# Patient Record
Sex: Female | Born: 1955 | Race: Black or African American | Hispanic: No | Marital: Single | Smoking: Former smoker
Health system: Southern US, Community
[De-identification: ages and names within clinical notes are randomized; demographics above are authoritative.]

## PROBLEM LIST (undated history)

## (undated) DIAGNOSIS — F419 Anxiety disorder, unspecified: Secondary | ICD-10-CM

## (undated) DIAGNOSIS — E119 Type 2 diabetes mellitus without complications: Secondary | ICD-10-CM

## (undated) DIAGNOSIS — G4730 Sleep apnea, unspecified: Secondary | ICD-10-CM

## (undated) DIAGNOSIS — N189 Chronic kidney disease, unspecified: Secondary | ICD-10-CM

## (undated) DIAGNOSIS — K219 Gastro-esophageal reflux disease without esophagitis: Secondary | ICD-10-CM

## (undated) DIAGNOSIS — I10 Essential (primary) hypertension: Secondary | ICD-10-CM

## (undated) DIAGNOSIS — M059 Rheumatoid arthritis with rheumatoid factor, unspecified: Secondary | ICD-10-CM

## (undated) DIAGNOSIS — T7840XA Allergy, unspecified, initial encounter: Secondary | ICD-10-CM

## (undated) DIAGNOSIS — M109 Gout, unspecified: Secondary | ICD-10-CM

## (undated) DIAGNOSIS — M5030 Other cervical disc degeneration, unspecified cervical region: Secondary | ICD-10-CM

## (undated) DIAGNOSIS — R0902 Hypoxemia: Secondary | ICD-10-CM

## (undated) HISTORY — DX: Other cervical disc degeneration, unspecified cervical region: M50.30

## (undated) HISTORY — DX: Gout, unspecified: M10.9

## (undated) HISTORY — PX: BREAST BIOPSY: SHX20

## (undated) HISTORY — DX: Rheumatoid arthritis with rheumatoid factor, unspecified: M05.9

## (undated) HISTORY — PX: OTHER SURGICAL HISTORY: SHX169

## (undated) HISTORY — PX: BREAST EXCISIONAL BIOPSY: SUR124

## (undated) HISTORY — PX: TUBAL LIGATION: SHX77

## (undated) HISTORY — PX: BREAST CYST EXCISION: SHX579

## (undated) HISTORY — DX: Allergy, unspecified, initial encounter: T78.40XA

## (undated) HISTORY — DX: Chronic kidney disease, unspecified: N18.9

## (undated) HISTORY — DX: Gastro-esophageal reflux disease without esophagitis: K21.9

## (undated) HISTORY — PX: FOOT SURGERY: SHX648

## (undated) HISTORY — DX: Essential (primary) hypertension: I10

## (undated) HISTORY — DX: Hypoxemia: R09.02

## (undated) HISTORY — DX: Anxiety disorder, unspecified: F41.9

## (undated) HISTORY — PX: CHOLECYSTECTOMY: SHX55

---

## 2006-12-07 ENCOUNTER — Ambulatory Visit (HOSPITAL_COMMUNITY): Admission: RE | Admit: 2006-12-07 | Discharge: 2006-12-07 | Payer: Self-pay | Admitting: Family Medicine

## 2011-11-09 HISTORY — PX: SPINE SURGERY: SHX786

## 2013-11-28 LAB — HM COLONOSCOPY: HM Colonoscopy: NORMAL

## 2015-01-30 LAB — MICROALBUMIN, URINE: Microalb, Ur: 0.2

## 2015-07-24 LAB — HM DIABETES EYE EXAM

## 2015-08-13 ENCOUNTER — Ambulatory Visit
Admission: RE | Admit: 2015-08-13 | Discharge: 2015-08-13 | Disposition: A | Discharge status: Home / Self Care | Payer: Commercial Managed Care - HMO | Source: Ambulatory Visit | Attending: Internal Medicine | Admitting: Internal Medicine

## 2015-08-13 ENCOUNTER — Other Ambulatory Visit: Payer: Self-pay | Admitting: Internal Medicine

## 2015-08-13 DIAGNOSIS — M542 Cervicalgia: Secondary | ICD-10-CM

## 2015-09-01 ENCOUNTER — Other Ambulatory Visit: Payer: Self-pay | Admitting: Internal Medicine

## 2015-09-01 DIAGNOSIS — M5010 Cervical disc disorder with radiculopathy, unspecified cervical region: Secondary | ICD-10-CM

## 2015-09-08 ENCOUNTER — Ambulatory Visit: Payer: Commercial Managed Care - HMO

## 2015-09-08 ENCOUNTER — Ambulatory Visit
Admission: RE | Admit: 2015-09-08 | Discharge: 2015-09-08 | Disposition: A | Discharge status: Home / Self Care | Payer: Commercial Managed Care - HMO | Source: Ambulatory Visit | Attending: Internal Medicine | Admitting: Internal Medicine

## 2015-09-08 DIAGNOSIS — R200 Anesthesia of skin: Secondary | ICD-10-CM | POA: Insufficient documentation

## 2015-09-08 DIAGNOSIS — M5030 Other cervical disc degeneration, unspecified cervical region: Secondary | ICD-10-CM | POA: Diagnosis not present

## 2015-09-08 DIAGNOSIS — M4802 Spinal stenosis, cervical region: Secondary | ICD-10-CM | POA: Diagnosis not present

## 2015-09-08 DIAGNOSIS — M5010 Cervical disc disorder with radiculopathy, unspecified cervical region: Secondary | ICD-10-CM

## 2015-09-08 DIAGNOSIS — R202 Paresthesia of skin: Principal | ICD-10-CM

## 2015-09-08 DIAGNOSIS — R278 Other lack of coordination: Secondary | ICD-10-CM | POA: Insufficient documentation

## 2015-09-16 ENCOUNTER — Encounter: Payer: Self-pay | Admitting: Occupational Therapy

## 2015-09-16 ENCOUNTER — Ambulatory Visit: Payer: Commercial Managed Care - HMO | Attending: Rheumatology | Admitting: Occupational Therapy

## 2015-09-16 DIAGNOSIS — M79641 Pain in right hand: Secondary | ICD-10-CM | POA: Diagnosis present

## 2015-09-16 DIAGNOSIS — M25641 Stiffness of right hand, not elsewhere classified: Secondary | ICD-10-CM

## 2015-09-16 DIAGNOSIS — M6281 Muscle weakness (generalized): Secondary | ICD-10-CM | POA: Insufficient documentation

## 2015-09-16 NOTE — Therapy (Signed)
[place] [facility] [address] [place], [address] Phone: [phone]   Fax:  [phone]  Occupational Therapy Treatment  Patient Details  Name: Peta Peachey MRN: [id number] Date of Birth: 09-01-1956 Referring Provider: Gavin Potters  Encounter Date: 09/16/2015      OT End of Session - 09/16/15 1444    Visit Number 1   Number of Visits 6   Date for OT Re-Evaluation 10/07/15   OT Start Time 1004   OT Stop Time 1100   OT Time Calculation (min) 56 min   Activity Tolerance Patient tolerated treatment well   Behavior During Therapy [facility] for tasks assessed/performed      Past Medical History  Diagnosis Date  . Hypertension   . Anxiety   . Chronic kidney disease     Past Surgical History  Procedure Laterality Date  . Spine surgery      There were no vitals filed for this visit.  Visit Diagnosis:  Stiffness of joint, hand, right - Plan: Ot plan of care cert/re-cert  Pain of right hand - Plan: Ot plan of care cert/re-cert  Muscle weakness - Plan: Ot plan of care cert/re-cert      Subjective Assessment - 09/16/15 1235    Subjective  My R hand worse than the L - started with spasms and now it gets stiff, lock , pain  the last few months   Patient Stated Goals I want my hands better and find out what I need to change  in the way I'm using them to prevent them  hurting    Pain Score 6    Pain Location Hand   Pain Orientation Right   Pain Descriptors / Indicators Aching   Pain Onset More than a month ago   Pain Frequency Constant   Aggravating Factors  using my hands repetitively  and cold   Pain Relieving Factors heat            [facility] OT Assessment - 09/16/15 0001    Assessment   Diagnosis inflammatory arthritis - bilateral hand pain    Referring Provider [facility]   Onset Date 02/27/15   Assessment Pt present with  pain in R hand more than L - seen DR Linus Salmons - follow up in 2 wks - pt  refer for hand pain , 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  - show increase edema and pin in R 2nd and 3rd MC's - report some numbness in  forearm to hand on dorsal side - pt has history of C6 fusion    Home  Environment   Lives With Family   Prior Function   Leisure Pt just move 3months ago from [place] - on disability - likes to read (religious ), sit on couch , watch tv, do some sewing at [employer] once a week, cleaning and cooking around the house , some yardwork,   AROM   Right Wrist Extension 65 Degrees   Right Wrist Flexion 85 Degrees   Left Wrist Extension 65 Degrees   Left Wrist Flexion 85 Degrees   Strength   Right Hand Grip (lbs) 25   Right Hand Lateral Pinch 6 lbs   Right Hand 3 Point Pinch 5 lbs   Left Hand Grip (lbs) 45   Left Hand Lateral Pinch 9 lbs   Left Hand 3 Point Pinch 12 lbs   Right Hand AROM   R Thumb Radial ABduction/ADduction 0-55 48   R Thumb Palmar ABduction/ADduction  0-45 54   R Thumb Opposition to Index --  Opposition to base 5th    R Index PIP 0-100 85 Degrees   R Index DIP 0-70 100 Degrees   R Long  MCP 0-90 82 Degrees   R Long PIP 0-100 100 Degrees   R Ring  MCP 0-90 85 Degrees   R Ring PIP 0-100 100 Degrees   R Little  MCP 0-90 90 Degrees   R Little PIP 0-100 100 Degrees   Left Hand AROM   L Thumb Opposition to Index --  Opposition to [facility]    L Index  MCP 0-90 90 Degrees   L Index PIP 0-100 100 Degrees   L Long  MCP 0-90 90 Degrees   L Long PIP 0-100 100 Degrees   L Ring  MCP 0-90 90 Degrees   L Ring PIP 0-100 100 Degrees   L Little  MCP 0-90 90 Degrees   L Little PIP 0-100 100 Degrees                  OT Treatments/Exercises (OP) - 09/16/15 0001    RUE Paraffin   Number Minutes Paraffin 10 Minutes   RUE Paraffin Location Hand   Comments decrease pain and stiffness prior to review of HEP    LUE Paraffin   Number Minutes Paraffin 10 Minutes   LUE Paraffin Location Hand   Comments prior to review of HEP to decrease pain and stiffness                  OT Education - 09/16/15 1444    Education provided Yes   Education Details HEP, joint protrection and AE    Person(s) Educated Patient   Methods Handout;Verbal cues;Tactile cues;Demonstration;Explanation   Comprehension Verbalized understanding;Returned demonstration;Verbal cues required;Tactile cues required          OT Short Term Goals - 09/16/15 1448    OT SHORT TERM GOAL #1   Title Pt pain on PRWHE for hands improve at least 10-15 points    Baseline Pain on PRHWE 43/50 at eval    Time 2   Period Weeks   Status New   OT SHORT TERM GOAL #2   Title Pt to be ind in HEP to improve  and increase  ROM and grip in in R hand    Baseline see flowsheet    Time 2   Period Weeks   Status New           OT Long Term Goals - 09/16/15 1451    OT LONG TERM GOAL #1   Title Function on PRWHE improve by at least 10-15 points    Baseline Function on PRWHE at eval 33.5/50   OT LONG TERM GOAL #2   Title Pt to verbalize 3 joint protection and AE to increase ease at home    Baseline very litte knowledge    Time 3   Period Weeks   Status New               Plan - 09/16/15 1445    Clinical Impression Statement Pt present with pain in R hand more than L - in palm and over 2nd and 3rd MC - only AROM lmtitations are 2nd and 3rd digits MC flexion by 5-10 degrees - did show decrease grip and prehension strength in R  - pt report sensory changes in dorsal foream and hand  on R , but also in  neck and face -  MD's aware of it and was seen for it in past -  pt  has hiistory of C6 fusion - do not think it is CTS -  pt can benefit from  OT for  educaion on HEP to decrease pain, increase ROM  and strength - and education on  joint protecion principles and AE trng    Pt will benefit from skilled therapeutic intervention in order to improve on the following deficits (Retired) Decreased knowledge of use of DME;Decreased range of motion;Decreased strength;Impaired  flexibility;Impaired sensation;Pain;Impaired UE functional use   Rehab Potential Fair   Clinical Impairments Affecting Rehab Potential C6 fusion    OT Frequency 2x / week   OT Duration 4 weeks   OT Treatment/Interventions Self-care/ADL training;Therapeutic exercise;Patient/family education;Splinting;Manual Therapy;Moist Heat;Contrast Bath;Parrafin   Plan  Assess HEP , pain , joint protecionAE trng    OT Home Exercise Plan see pt instruction   Consulted and Agree with Plan of Care Patient        Problem List There are no active problems to display for this patient.   Oletta CohnuPreez, Maylyn Narvaiz OTR/, CLT 09/16/2015, 2:58 PM  [place] [facility] [address] [place], [place], [address] Phone: [phone]   Fax:  [phone]  Name: Elyn Aquasngel Langer MRN: [id number] Date of Birth: 13-Mar-1956

## 2015-09-16 NOTE — Patient Instructions (Signed)
Pt to do heat or contrast  AROM - tendon glides Opposition  Thumb PA and RA , RD of digits - 8 reps . 2 x day   Joint protection ed done and AE

## 2015-09-22 ENCOUNTER — Ambulatory Visit: Payer: Commercial Managed Care - HMO | Admitting: Occupational Therapy

## 2015-09-22 DIAGNOSIS — M79641 Pain in right hand: Secondary | ICD-10-CM

## 2015-09-22 DIAGNOSIS — M25641 Stiffness of right hand, not elsewhere classified: Secondary | ICD-10-CM

## 2015-09-22 DIAGNOSIS — M6281 Muscle weakness (generalized): Secondary | ICD-10-CM

## 2015-09-22 NOTE — Patient Instructions (Signed)
Same HEP  Cont with joint protection and AE use

## 2015-09-22 NOTE — Therapy (Signed)
[place] [facility] [address] [place], [address] Phone: [phone]   Fax:  [phone]  Occupational Therapy Treatment  Patient Details  Name: Islah Eve MRN: [id number] Date of Birth: 06-04-1956 Referring Provider: Gavin Potters  Encounter Date: 09/22/2015      OT End of Session - 09/22/15 1043    Visit Number 2   Number of Visits 6   Date for OT Re-Evaluation 10/07/15   OT Start Time 1008   OT Stop Time 1102   OT Time Calculation (min) 54 min   Activity Tolerance Patient tolerated treatment well   Behavior During Therapy [facility] for tasks assessed/performed      Past Medical History  Diagnosis Date  . Hypertension   . Anxiety   . Chronic kidney disease     Past Surgical History  Procedure Laterality Date  . Spine surgery      There were no vitals filed for this visit.  Visit Diagnosis:  Pain of right hand  Stiffness of joint, hand, right  Muscle weakness      Subjective Assessment - 09/22/15 1038    Subjective  I can do the exercises on the R hand to slide my fingers better- but my L pinkie is starting to spasm like my R hand started - I did get me fatter pen, spring loaded scissores, read hand out and change the way I grab objects                       OT Treatments/Exercises (OP) - 09/22/15 0001    ADLs   ADL Comments discuss with pt adaptations for writing - penagain, springloaded scissors, ironing built up handle,  jewelry - can use magnet - but fitting sheet is trouble for her , and  combing hair - pt cannot use comb but brush not options ; get mitten for wash cloth - do not have to grip    Hand Exercises   Other Hand Exercises Tendonglides , thumb PA and RA  AROM done - as well as opposition to all digits    Other Hand Exercises Pt not to do squeezing ball that she started doing herself - did RD of diigits in bilateral hands but need paper under 4th and 5th - add tapping of  digits - needed PROM for 4th and 5th on both hands -    RUE Paraffin   Number Minutes Paraffin 10 Minutes   RUE Paraffin Location Hand   Comments decrease pain and increase ROM    LUE Paraffin   Number Minutes Paraffin 10 Minutes   LUE Paraffin Location Hand   Comments decrease spasm and pain in L  at [facility]    Manual Therapy   Manual therapy comments Soft tissue mobs to 2nd and 3rd MC's and PIP of R hand prior to ROM - afterparafin to increase motions -  getle joint mobs - MC spreads to bilateral hands                 OT Education - 09/22/15 1043    Education provided Yes   Education Details HEP and adaptations    Person(s) Educated Patient   Methods Explanation;Demonstration;Tactile cues;Verbal cues   Comprehension Verbal cues required;Returned demonstration;Verbalized understanding          OT Short Term Goals - 09/16/15 1448    OT SHORT TERM GOAL #1   Title Pt pain on PRWHE for hands improve at  least 10-15 points    Baseline Pain on PRHWE 43/50 at eval    Time 2   Period Weeks   Status New   OT SHORT TERM GOAL #2   Title Pt to be ind in HEP to improve  and increase  ROM and grip in in R hand    Baseline see flowsheet    Time 2   Period Weeks   Status New           OT Long Term Goals - 09/16/15 1451    OT LONG TERM GOAL #1   Title Function on PRWHE improve by at least 10-15 points    Baseline Function on PRWHE at eval 33.5/50   OT LONG TERM GOAL #2   Title Pt to verbalize 3 joint protection and AE to increase ease at home    Baseline very litte knowledge    Time 3   Period Weeks   Status New               Plan - 09/22/15 1110    Clinical Impression Statement Pt report some of exercises was better - but she did get ball to squeeze and was pushing some of the exericises to pain - still feel some of her symptoms coming for cerivial - pain cont to be at 2nd and 3rd R MC's but after parafin decrease from 8 to 4/10 - pt to stop when feeling slight  pull and stop with squeeze ball - pt motivated and doing adaptations to ADl's and IADl's as well as AE - did add tapping to HEP  to increase R digiits extention    Pt will benefit from skilled therapeutic intervention in order to improve on the following deficits (Retired) Decreased knowledge of use of DME;Decreased range of motion;Decreased strength;Impaired flexibility;Impaired sensation;Pain;Impaired UE functional use   Rehab Potential Fair   Clinical Impairments Affecting Rehab Potential C6 fusion    OT Frequency 4x / week   OT Duration 4 weeks   OT Treatment/Interventions Self-care/ADL training;Therapeutic exercise;Patient/family education;Splinting;Manual Therapy;Moist Heat;Contrast Bath;Parrafin   Plan assess pain , HEP - joint protection /AE trng    OT Home Exercise Plan see pt instruction   Consulted and Agree with Plan of Care Patient        Problem List There are no active problems to display for this patient.   Oletta CohnuPreez, Manuelita Moxon OTR/L,CLT 09/22/2015, 11:15 AM  [place] [facility] [address] [place], [place], [address] Phone: [phone]   Fax:  [phone]  Name: Elyn Aquasngel Scherger MRN: [id number] Date of Birth: [birth year]

## 2015-09-24 ENCOUNTER — Ambulatory Visit: Payer: Commercial Managed Care - HMO | Admitting: Occupational Therapy

## 2015-09-24 DIAGNOSIS — M6281 Muscle weakness (generalized): Secondary | ICD-10-CM

## 2015-09-24 DIAGNOSIS — M79641 Pain in right hand: Secondary | ICD-10-CM

## 2015-09-24 DIAGNOSIS — M25641 Stiffness of right hand, not elsewhere classified: Secondary | ICD-10-CM | POA: Diagnosis not present

## 2015-09-24 NOTE — Therapy (Signed)
[place] [facility] [address], [place], [address] Phone: [phone]   Fax:  [phone]  Occupational Therapy Treatment  Patient Details  Name: Kim Thomas MRN: [id number] Date of Birth: [birth year] Referring Provider: Gavin Potters  Encounter Date: 09/24/2015      OT End of Session - 09/24/15 1137    Visit Number 3   Number of Visits 6   Date for OT Re-Evaluation 10/07/15   OT Start Time 1015   OT Stop Time 1114   OT Time Calculation (min) 59 min   Activity Tolerance Patient tolerated treatment well   Behavior During Therapy [facility] for tasks assessed/performed      Past Medical History  Diagnosis Date  . Hypertension   . Anxiety   . Chronic kidney disease     Past Surgical History  Procedure Laterality Date  . Spine surgery      There were no vitals filed for this visit.  Visit Diagnosis:  Pain of right hand  Stiffness of joint, hand, right  Muscle weakness      Subjective Assessment - 09/24/15 1047    Subjective  This am I have pain coming from my shoulder down to my wrist - I was holding my arms up when doing my exercises - palm and pinkie was hurting last night at the end of my exericses - pinkies do not want to come in to other figners    Patient Stated Goals I want my hands better and find out what I need to change  in the way I'm using them to prevent them  hurting    Currently in Pain? Yes   Pain Score 10-Worst pain ever   Pain Location Shoulder   Pain Orientation Right   Pain Descriptors / Indicators Pounding   Pain Onset Today            [facility] OT Assessment - 09/24/15 0001    Strength   Right Hand Grip (lbs) 24   Right Hand Lateral Pinch 10 lbs   Right Hand 3 Point Pinch 13 lbs   Left Hand Grip (lbs) 45   Left Hand Lateral Pinch 15 lbs   Left Hand 3 Point Pinch 13 lbs                  OT Treatments/Exercises (OP) - 09/24/15 0001    Hand Exercises   Other Hand  Exercises Tendon glides done , thumb PA and RA , taping of dgits - on lap     Other Hand Exercises Opposition , ABD and ADD of digits on lap - measured grip and prehension - improved prehension on bil hands    RUE Paraffin   Number Minutes Paraffin 10 Minutes   RUE Paraffin Location Hand   Comments decrease pain and increae ROM at [facility] - MC flexion improved    LUE Paraffin   Number Minutes Paraffin 10 Minutes   LUE Paraffin Location Hand   Comments decrease pain and increaes ROM  at [facility]    Manual Therapy   Manual therapy comments Soft tissue mobs to 2nd and 3rd MC's and PIP of R hand prior to ROM - afterparafin to increase motions -  getle joint mobs - MC spreads to bilateral hands                 OT Education - 09/24/15 1137    Education provided Yes   Education Details HEP   Person(s) Educated  Patient   Methods Explanation;Demonstration;Tactile cues;Verbal cues   Comprehension Verbalized understanding;Returned demonstration;Verbal cues required          OT Short Term Goals - 09/24/15 1140    OT SHORT TERM GOAL #1   Title Pt pain on PRWHE for hands improve at least 10-15 points    Baseline Pain on PRHWE 43/50 at eval    Time 2   Period Weeks   Status On-going   OT SHORT TERM GOAL #2   Title Pt to be ind in HEP to improve  and increase  ROM and grip in in R hand    Baseline see flowsheet    Time 2   Period Weeks   Status On-going           OT Long Term Goals - 09/24/15 1140    OT LONG TERM GOAL #1   Title Function on PRWHE improve by at least 10-15 points    Baseline Function on PRWHE at eval 33.5/50   Period Weeks   Status On-going   OT LONG TERM GOAL #2   Title Pt to verbalize 3 joint protection and AE to increase ease at home    Baseline very litte knowledge    Time 3   Period Weeks   Status On-going               Plan - 09/24/15 1138    Clinical Impression Statement Pt  progressed in pain on R 2nd and 3rd MC - increaes ROM after heat and  increase prehension strength but show decrease ADD of 5th , grip still the same j- and this date report some pain in R shoulder down on forearm - change HEP to doing on lap - questtioning cervical involvement - ptto see Neurologist or surgeon  in the next few days or week    Pt will benefit from skilled therapeutic intervention in order to improve on the following deficits (Retired) Decreased knowledge of use of DME;Decreased range of motion;Decreased strength;Impaired flexibility;Impaired sensation;Pain;Impaired UE functional use   Rehab Potential Fair   Clinical Impairments Affecting Rehab Potential C6 fusion    OT Frequency 2x / week   OT Duration 4 weeks   OT Treatment/Interventions Self-care/ADL training;Therapeutic exercise;Patient/family education;Splinting;Manual Therapy;Moist Heat;Contrast Bath;Parrafin   Plan cont to increase grip and ROM i nhands - ed on jjoint protection and AE    OT Home Exercise Plan see pt instruction   Consulted and Agree with Plan of Care Patient        Problem List There are no active problems to display for this patient.   Oletta CohnuPreez, Chanda Laperle OTR/L,CLT 09/24/2015, 11:41 AM  [place] [facility] [address] [place], [place], [address] Phone: [phone]   Fax:  [phone]  Name: Kim Thomas MRN: [id number] Date of Birth: 06-14-1956

## 2015-09-24 NOTE — Patient Instructions (Signed)
Same HEP than last time - but on lap - not holding hands up  - causing stress on cervical if holding hands up

## 2015-09-29 ENCOUNTER — Ambulatory Visit: Payer: Commercial Managed Care - HMO | Attending: Rheumatology | Admitting: Occupational Therapy

## 2015-09-29 DIAGNOSIS — M79641 Pain in right hand: Secondary | ICD-10-CM

## 2015-09-29 DIAGNOSIS — M25641 Stiffness of right hand, not elsewhere classified: Secondary | ICD-10-CM

## 2015-09-29 DIAGNOSIS — M6281 Muscle weakness (generalized): Secondary | ICD-10-CM | POA: Diagnosis present

## 2015-09-29 NOTE — Therapy (Signed)
[place] [facility] PH[facility]ORTS MEDICINE [address], [place], [address] Phone: [phone]   Fax:  [phone]  Occupational Therapy Treatment  Patient Details  Name: Kim Thomas MRN: [id number] Date of Birth: 03/16/1956 Referring Provider: Gavin Potters  Encounter Date: 09/29/2015      OT End of Session - 09/29/15 1613    Visit Number 4   Number of Visits 6   Date for OT Re-Evaluation 10/07/15   OT Start Time 1144   OT Stop Time 1225   OT Time Calculation (min) 41 min   Activity Tolerance Patient tolerated treatment well   Behavior During Therapy [facility] for tasks assessed/performed      Past Medical History  Diagnosis Date  . Hypertension   . Anxiety   . Chronic kidney disease     Past Surgical History  Procedure Laterality Date  . Spine surgery      There were no vitals filed for this visit.  Visit Diagnosis:  Pain of right hand  Stiffness of joint, hand, right  Muscle weakness      Subjective Assessment - 09/29/15 1158    Subjective  I find I need defintely do heat prior to ROM - my neck and shoulder still bother me - did some heat ont my neck - seeing MD for my neck on Firday - my hands are better since you started with me - spasms better - but still some what on the R hand 4th and 5th    Patient Stated Goals I want my hands better and find out what I need to change  in the way I'm using them to prevent them  hurting    Currently in Pain? Yes   Pain Score 7    Pain Location Shoulder   Pain Orientation Right            [facility] OT Assessment - 09/29/15 0001    Right Hand AROM   R Index PIP 0-100 90 Degrees   R Index DIP 0-70 100 Degrees   R Long  MCP 0-90 90 Degrees   R Long PIP 0-100 100 Degrees   R Ring  MCP 0-90 90 Degrees   R Ring PIP 0-100 100 Degrees   R Little  MCP 0-90 90 Degrees   R Little PIP 0-100 100 Degrees                  OT Treatments/Exercises (OP) - 09/29/15 0001    Hand  Exercises   Other Hand Exercises AAROM of fisting R hand , opposition to base of 5th , RD on bilateral hands - needs AAROM for 4th and 5th - tapping of digitds - needs Min A for 4th - 4 reps - then can do place and hold, L hand full fist AROM - need min v/c to perform correct    Other Hand Exercises Measurements taken for ROM , R MC's    RUE Paraffin   Number Minutes Paraffin 10 Minutes   RUE Paraffin Location Hand   Comments At [facility] to increase ROM , decrease pain    LUE Paraffin   Number Minutes Paraffin 10 Minutes   LUE Paraffin Location Hand   Comments at [facility] to increaes ROM and decrease pain    Manual Therapy   Manual therapy comments Soft tissue mobs to 2nd and 3rd MC's and PIP of R hand prior to ROM - afterparafin to increase motions -  getle joint mobs - MC  spreads to bilateral hands - no pain this date                 OT Education - 09/29/15 1613    Education provided Yes   Education Details HEP   Person(s) Educated Patient   Methods Explanation;Demonstration;Tactile cues;Verbal cues   Comprehension Verbal cues required;Returned demonstration;Verbalized understanding          OT Short Term Goals - 09/24/15 1140    OT SHORT TERM GOAL #1   Title Pt pain on PRWHE for hands improve at least 10-15 points    Baseline Pain on PRHWE 43/50 at eval    Time 2   Period Weeks   Status On-going   OT SHORT TERM GOAL #2   Title Pt to be ind in HEP to improve  and increase  ROM and grip in in R hand    Baseline see flowsheet    Time 2   Period Weeks   Status On-going           OT Long Term Goals - 09/24/15 1140    OT LONG TERM GOAL #1   Title Function on PRWHE improve by at least 10-15 points    Baseline Function on PRWHE at eval 33.5/50   Period Weeks   Status On-going   OT LONG TERM GOAL #2   Title Pt to verbalize 3 joint protection and AE to increase ease at home    Baseline very litte knowledge    Time 3   Period Weeks   Status On-going                Plan - 09/29/15 1614    Clinical Impression Statement Pt ROM improved to WNL in R hand - pt to cont with moist heat at home - pain improved in hand after heat - pt to look into parafin bath - pt cont to have some cervical , R shoulder pain - some tightness in ulnar side of hand lmiting  extention and ADD of 5th - pt to see neuro surgoen on Friday    Pt will benefit from skilled therapeutic intervention in order to improve on the following deficits (Retired) Decreased knowledge of use of DME;Decreased range of motion;Decreased strength;Impaired flexibility;Impaired sensation;Pain;Impaired UE functional use   Rehab Potential Fair   OT Frequency 2x / week   OT Duration 2 weeks   OT Treatment/Interventions Self-care/ADL training;Therapeutic exercise;Patient/family education;Splinting;Manual Therapy;Moist Heat;Contrast Bath;Parrafin   Plan assess grip ,    OT Home Exercise Plan see pt instruction   Consulted and Agree with Plan of Care Patient        Problem List There are no active problems to display for this patient.   Oletta CohnuPreez, Kjersti Dittmer OTR/L,CLT 09/29/2015, 4:17 PM  [place] [facility] [address] [place], [place], [address] Phone: [phone]   Fax:  [phone]  Name: Kim Thomas MRN: [id number] Date of Birth: 1956/08/21

## 2015-09-29 NOTE — Patient Instructions (Signed)
Same HEP for ROM  Same for joint protection and AE

## 2015-10-01 ENCOUNTER — Ambulatory Visit: Payer: Commercial Managed Care - HMO | Admitting: Occupational Therapy

## 2015-10-01 DIAGNOSIS — M79641 Pain in right hand: Secondary | ICD-10-CM | POA: Diagnosis not present

## 2015-10-01 DIAGNOSIS — M6281 Muscle weakness (generalized): Secondary | ICD-10-CM

## 2015-10-01 DIAGNOSIS — M25641 Stiffness of right hand, not elsewhere classified: Secondary | ICD-10-CM

## 2015-10-01 NOTE — Patient Instructions (Signed)
Same HEP  As well as joint protection , AE and modifications

## 2015-10-01 NOTE — Therapy (Signed)
[place] [facility] [address] [place], [place], [address] Phone: [phone]   Fax:  [phone]  Occupational Therapy Treatment  Patient Details  Name: Kim Thomas MRN: [id number] Date of Birth: 09/05/1956 Referring Provider: Gavin PottersKernodle  Encounter Date: 10/01/2015      OT End of Session - 10/01/15 1052    Visit Number 5   Number of Visits 6   Date for OT Re-Evaluation 10/07/15   OT Start Time 0955   OT Stop Time 1048   OT Time Calculation (min) 53 min   Activity Tolerance Patient tolerated treatment well   Behavior During Therapy [facility] for tasks assessed/performed      Past Medical History  Diagnosis Date  . Hypertension   . Anxiety   . Chronic kidney disease     Past Surgical History  Procedure Laterality Date  . Spine surgery      There were no vitals filed for this visit.  Visit Diagnosis:  Pain of right hand  Stiffness of joint, hand, right  Muscle weakness      Subjective Assessment - 10/01/15 1007    Subjective  I did something on Tuesday - I screwed some screws for futon - that was causing my hand to hurt yesterday - but today it is better    Patient Stated Goals I want my hands better and find out what I need to change  in the way I'm using them to prevent them  hurting    Currently in Pain? Yes   Pain Score 3    Pain Location Hand            [facility] OT Assessment - 10/01/15 0001    Strength   Right Hand Grip (lbs) 44   Right Hand Lateral Pinch 13 lbs   Right Hand 3 Point Pinch 10 lbs   Left Hand Grip (lbs) 47   Left Hand Lateral Pinch 16 lbs   Left Hand 3 Point Pinch 14 lbs   Right Hand AROM   R Index  MCP 0-90 85 Degrees   R Index PIP 0-100 100 Degrees   R Long  MCP 0-90 90 Degrees   R Long PIP 0-100 100 Degrees   R Ring  MCP 0-90 90 Degrees   R Ring PIP 0-100 100 Degrees   R Little  MCP 0-90 90 Degrees   R Little PIP 0-100 100 Degrees                  OT  Treatments/Exercises (OP) - 10/01/15 0001    ADLs   ADL Comments Ed pt on jointprotection - what pt already starting to do - larger handles, bigger joints - also bought some  AE - and modify how she grip or carry objects in kitchen - did had questions about turning car and houes key,  fasten buttons and shoed - info provided on AE    Hand Exercises   Other Hand Exercises AAROM of fisting R , AROM fist L , AAROM opposition to base of 5th    Other Hand Exercises Measurements taken for ROM digits, grip/prehenionsion ( see flowsheet)   RUE Paraffin   Number Minutes Paraffin 10 Minutes   RUE Paraffin Location Hand   [facility] and decrease pain    LUE Paraffin   Number Minutes Paraffin 10 Minutes   LUE Paraffin Location Hand   Comments AT SOC to decrease pain and increase ROM  OT Education - 10/01/15 1051    Education provided Yes   Education Details HEP and info on modifications/AE   Person(s) Educated Patient   Methods Explanation;Tactile cues;Handout;Verbal cues;Demonstration   Comprehension Returned demonstration;Verbalized understanding;Verbal cues required          OT Short Term Goals - 09/24/15 1140    OT SHORT TERM GOAL #1   Title Pt pain on PRWHE for hands improve at least 10-15 points    Baseline Pain on PRHWE 43/50 at eval    Time 2   Period Weeks   Status On-going   OT SHORT TERM GOAL #2   Title Pt to be ind in HEP to improve  and increase  ROM and grip in in R hand    Baseline see flowsheet    Time 2   Period Weeks   Status On-going           OT Long Term Goals - 09/24/15 1140    OT LONG TERM GOAL #1   Title Function on PRWHE improve by at least 10-15 points    Baseline Function on PRWHE at eval 33.5/50   Period Weeks   Status On-going   OT LONG TERM GOAL #2   Title Pt to verbalize 3 joint protection and AE to increase ease at home    Baseline very litte knowledge    Time 3   Period Weeks   Status On-going                Plan - 10/01/15 1052    Clinical Impression Statement Pt showed great progress in grip and prehension bilateral hands compare to eval - ROM after moist heat WNL in hands - but still cont to have pain over 2nd and 3rd R MC's - and  5th digits ADD impaired and tightness in ulnar 2 digits - ? cervical coming from - pt to see neurosurgeon on Fri and Dr Gavin Potters  Monday - plan to discharge pt  next appt with HEP    Pt will benefit from skilled therapeutic intervention in order to improve on the following deficits (Retired) Decreased knowledge of use of DME;Decreased range of motion;Decreased strength;Impaired flexibility;Impaired sensation;Pain;Impaired UE functional use   Rehab Potential Fair   Clinical Impairments Affecting Rehab Potential C6 fusion    OT Frequency 1x / week   OT Duration 1 weeks   OT Treatment/Interventions Self-care/ADL training;Therapeutic exercise;Patient/family education;Splinting;Manual Therapy;Moist Heat;Contrast Bath;Parrafin   Plan how MD appts went - make sure pt Ind in HEP and modifications    OT Home Exercise Plan see pt instruction   Consulted and Agree with Plan of Care Patient        Problem List There are no active problems to display for this patient.   Oletta Cohn OTR/L,CLT 10/01/2015, 10:56 AM  [place] [facility] PHYSICAL AND SPORTS MEDICINE [address], [place], [address] Phone: [phone]   Fax:  [phone]  Name: Murray Guzzetta MRN: [id number] Date of Birth: 06/28/1956

## 2015-10-06 ENCOUNTER — Ambulatory Visit: Payer: Commercial Managed Care - HMO | Admitting: Occupational Therapy

## 2015-10-06 DIAGNOSIS — M6281 Muscle weakness (generalized): Secondary | ICD-10-CM

## 2015-10-06 DIAGNOSIS — M79641 Pain in right hand: Secondary | ICD-10-CM

## 2015-10-06 DIAGNOSIS — M25641 Stiffness of right hand, not elsewhere classified: Secondary | ICD-10-CM

## 2015-10-06 NOTE — Patient Instructions (Signed)
Hold off on paraffin and do only 2 x day AROM for lumbrical and intrinsic fist - Hold off on composite fist  And wear splint for about 5 days and assess pain

## 2015-10-06 NOTE — Therapy (Signed)
[place] [facility] [address] [place], [place], [address] Phone: [phone]   Fax:  [phone]  Occupational Therapy Treatment  Patient Details  Name: Kim Thomas MRN: [id number] Date of Birth: 11-Feb-1956 Referring Provider: Gavin PottersK[facility]le  Encounter Date: 10/06/2015      OT End of Session - 10/06/15 1045    Visit Number 6   Number of Visits 8   Date for OT Re-Evaluation 10/20/15   OT Start Time 1001   OT Stop Time 1034   OT Time Calculation (min) 33 min   Activity Tolerance Patient tolerated treatment well   Behavior During Therapy [facility] for tasks assessed/performed      Past Medical History  Diagnosis Date  . Hypertension   . Anxiety   . Chronic kidney disease     Past Surgical History  Procedure Laterality Date  . Spine surgery      There were no vitals filed for this visit.  Visit Diagnosis:  Pain of right hand - Plan: Ot plan of care cert/re-cert  Stiffness of joint, hand, right - Plan: Ot plan of care cert/re-cert  Muscle weakness - Plan: Ot plan of care cert/re-cert      Subjective Assessment - 10/06/15 1004    Subjective  Seen Neurosurgeon , Dr Gavin PottersKernodle and neurology - they are going to MRI with dye to look what is going on at [facility] - Dr Kirtland BouchardK gave me shot for 3rd digit - trigger nodule    Patient Stated Goals I want my hands better and find out what I need to change  in the way I'm using them to prevent them  hurting    Currently in Pain? Yes   Pain Score 3    Pain Location Finger (Comment which one)   Pain Orientation Right   Pain Descriptors / Indicators Aching                      OT Treatments/Exercises (OP) - 10/06/15 0001    Hand Exercises   Other Hand Exercises AROM for lubrical fist , intrinsic fist , tapping (digits extention  ) 8 reps x 2 day for about 5 days    Other Hand Exercises Avoid sustained /composite fist    Splinting   Splinting Adjust prefab hand base dorsal   immobliization splint for R 2nd and 3rd - was not keeping 2nd digit in - reshaped and pt ed on wearing it - for about 5 days  - take off only  for bathing and ROM  2 x day                 OT Education - 10/06/15 1045    Education provided Yes   Education Details HEP, splint wearing    Person(s) Educated Patient   Methods Explanation;Demonstration;Tactile cues   Comprehension Returned demonstration;Verbalized understanding          OT Short Term Goals - 10/06/15 1050    OT SHORT TERM GOAL #1   Title Pt pain on PRWHE for hands improve at least 10-15 points    Baseline Pain on PRHWE 43/50 at eval    Time 2   Period Weeks   Status On-going   OT SHORT TERM GOAL #2   Title Pt to be ind in HEP to improve  and increase  ROM and grip in in R hand    Baseline see flowsheet    Time 2   Period Weeks  Status On-going           OT Long Term Goals - 10/06/15 1050    OT LONG TERM GOAL #1   Title Function on PRWHE improve by at least 10-15 points    Baseline Function on PRWHE at eval 33.5/50   Time 2   Period Weeks   Status On-going   OT LONG TERM GOAL #2   Title Pt to verbalize 3 joint protection and AE to increase ease at home    Status Achieved               Plan - 10/06/15 1046    Clinical Impression Statement Pt was seen by DR Gavin Potters yesterday and got shot in 3rd A1 pulley - and in immoblilization splint 2nd and 3rd - full extention - to wear for about 5 days but try some gentle AROM lumbrical fist and intrinsic fist 2 xday to maintain  ROM -  avoid sustained grip  or composite fist- and do not do paraffin bath  - pt  await MRI that neurosurgeon will order -  pt decrease to 1 x wk - to monitor pain and ROM  for 2 wks    Pt will benefit from skilled therapeutic intervention in order to improve on the following deficits (Retired) Decreased knowledge of use of DME;Decreased range of motion;Decreased strength;Impaired flexibility;Impaired sensation;Pain;Impaired UE  functional use   Rehab Potential Fair   Clinical Impairments Affecting Rehab Potential C6 fusion    OT Frequency 1x / week   OT Duration 2 weeks   OT Treatment/Interventions Self-care/ADL training;Therapeutic exercise;Patient/family education;Splinting;Manual Therapy;Moist Heat;Contrast Bath;Parrafin   Plan if pain improve and splint still wearing    OT Home Exercise Plan see pt instruction   Consulted and Agree with Plan of Care Patient        Problem List There are no active problems to display for this patient.   Oletta Cohn OTR/L,CLT 10/06/2015, 10:53 AM  [place] [facility] PHYSICAL AND SPORTS MEDICINE [address] [place], [address] Phone: [phone]   Fax:  [phone]  Name: Kim Thomas MRN: [id number] Date of Birth: 14-Apr-1956

## 2015-10-08 ENCOUNTER — Other Ambulatory Visit: Payer: Self-pay | Admitting: Neurosurgery

## 2015-10-08 DIAGNOSIS — M4802 Spinal stenosis, cervical region: Secondary | ICD-10-CM

## 2015-10-08 LAB — HM DIABETES FOOT EXAM

## 2015-10-13 ENCOUNTER — Ambulatory Visit: Payer: Commercial Managed Care - HMO | Admitting: Occupational Therapy

## 2015-10-13 DIAGNOSIS — M25641 Stiffness of right hand, not elsewhere classified: Secondary | ICD-10-CM

## 2015-10-13 DIAGNOSIS — M79641 Pain in right hand: Secondary | ICD-10-CM | POA: Diagnosis not present

## 2015-10-13 DIAGNOSIS — M6281 Muscle weakness (generalized): Secondary | ICD-10-CM

## 2015-10-13 NOTE — Patient Instructions (Signed)
Start back with HEP that she done prior to shot - but no force full fist during tendon glides

## 2015-10-13 NOTE — Therapy (Signed)
[place] [facility]TS MEDICINE [address], [place], [address] Phone: [phone]   Fax:  [phone]  Occupational Therapy Treatment  Patient Details  Name: Kim Thomas MRN: [id number] Date of Birth: Apr 11, 1956 Referring Provider: Gavin Potters  Encounter Date: 10/13/2015      OT End of Session - 10/13/15 1047    Visit Number 7   Number of Visits 8   Date for OT Re-Evaluation 10/20/15   OT Start Time 1001   OT Stop Time 1041   OT Time Calculation (min) 40 min   Activity Tolerance Patient tolerated treatment well   Behavior During Therapy [facility] for tasks assessed/performed      Past Medical History  Diagnosis Date  . Hypertension   . Anxiety   . Chronic kidney disease     Past Surgical History  Procedure Laterality Date  . Spine surgery      There were no vitals filed for this visit.  Visit Diagnosis:  Pain of right hand  Stiffness of joint, hand, right  Muscle weakness      Subjective Assessment - 10/13/15 1008    Subjective  I can tell pain is better - but still tender at that one spot - tenderness in the [facility] of index finger - but xray showed more the middle fingers - swelling is down but the index finger bother with lifting coffee cup   Patient Stated Goals I want my hands better and find out what I need to change  in the way I'm using them to prevent them  hurting    Currently in Pain? Yes   Pain Score 4    Pain Location Finger (Comment which one)   Pain Orientation Right   Pain Descriptors / Indicators Aching                      OT Treatments/Exercises (OP) - 10/13/15 0001    Hand Exercises   Other Hand Exercises Tendon glides AROM but not force - or tight fist - tapping of digits, ABD ad ADD of digits, opposition     Other Hand Exercises Splint if feels good can do 2hrs on and off - but not all the time anymore - extensores getting tight    RUE Paraffin   Number Minutes Paraffin 10  Minutes   RUE Paraffin Location Hand   Comments to decrease pain at [facility]   LUE Paraffin   Number Minutes Paraffin 10 Minutes   LUE Paraffin Location Hand   Comments decrease pain at [facility]    Manual Therapy   Manual therapy comments tender at ulnar side of 2nd and 3rd MC - tenderness- joint mobs  good movement                OT Education - 10/13/15 1047    Education provided Yes   Education Details HEP   Person(s) Educated Patient   Methods Explanation;Demonstration;Tactile cues;Verbal cues   Comprehension Returned demonstration;Verbalized understanding;Verbal cues required          OT Short Term Goals - 10/06/15 1050    OT SHORT TERM GOAL #1   Title Pt pain on PRWHE for hands improve at least 10-15 points    Baseline Pain on PRHWE 43/50 at eval    Time 2   Period Weeks   Status On-going   OT SHORT TERM GOAL #2   Title Pt to be ind in HEP to improve  and increase  ROM and grip in in R hand    Baseline see flowsheet    Time 2   Period Weeks   Status On-going           OT Long Term Goals - 10/06/15 1050    OT LONG TERM GOAL #1   Title Function on PRWHE improve by at least 10-15 points    Baseline Function on PRWHE at eval 33.5/50   Time 2   Period Weeks   Status On-going   OT LONG TERM GOAL #2   Title Pt to verbalize 3 joint protection and AE to increase ease at home    Status Achieved               Plan - 10/13/15 1048    Clinical Impression Statement Pt report she wore the splint until Sunday all the time - had some pulling over dorsal 2nd and 3rd digits during fisting this date  - pt to decrease wearing of splint and start back with HEP that she done prior for AROM - but no force full fisting during tedon glides - pt to cont with HEP and decreasepain - and use joint protecition  - will see pt back in week  for possible discharge    Pt will benefit from skilled therapeutic intervention in order to improve on the following deficits (Retired)  Decreased knowledge of use of DME;Decreased range of motion;Decreased strength;Impaired flexibility;Impaired sensation;Pain;Impaired UE functional use   Rehab Potential Fair   Clinical Impairments Affecting Rehab Potential C6 fusion    OT Frequency 1x / week   OT Duration 2 weeks   OT Treatment/Interventions Self-care/ADL training;Therapeutic exercise;Patient/family education;Splinting;Manual Therapy;Moist Heat;Contrast Bath;Parrafin   Plan HEP , pain    OT Home Exercise Plan see pt instruction   Consulted and Agree with Plan of Care Patient        Problem List There are no active problems to display for this patient.   Oletta CohnuPreez, Philana Younis OTR;L,CLT 10/13/2015, 10:51 AM  [place] [facility] [address] [place], [place], [address] Phone: [phone]   Fax:  [phone]  Name: Kim Thomas MRN: [id number] Date of Birth: Feb 11, 1956

## 2015-10-20 ENCOUNTER — Ambulatory Visit: Payer: Commercial Managed Care - HMO | Admitting: Occupational Therapy

## 2015-10-20 ENCOUNTER — Other Ambulatory Visit: Payer: Commercial Managed Care - HMO

## 2015-10-20 DIAGNOSIS — M79641 Pain in right hand: Secondary | ICD-10-CM

## 2015-10-20 DIAGNOSIS — M6281 Muscle weakness (generalized): Secondary | ICD-10-CM

## 2015-10-20 DIAGNOSIS — M25641 Stiffness of right hand, not elsewhere classified: Secondary | ICD-10-CM

## 2015-10-20 NOTE — Therapy (Signed)
[place] PHYSICAL AND SPORTS MEDICINE [address], [place], [address] Phone: [phone]   Fax:  [phone]  Occupational Therapy Treatment  Patient Details  Name: Kim Thomas MRN: [id number] Date of Birth: 1956/04/30 Referring Provider: Jefm Bryant  Encounter Date: 10/20/2015      OT End of Session - 10/20/15 1040    Visit Number 8   Number of Visits 8   Date for OT Re-Evaluation 10/20/15   OT Start Time 1000   OT Stop Time 1041   OT Time Calculation (min) 41 min   Activity Tolerance Patient tolerated treatment well   Behavior During Therapy [facility] for tasks assessed/performed      Past Medical History  Diagnosis Date  . Hypertension   . Anxiety   . Chronic kidney disease     Past Surgical History  Procedure Laterality Date  . Spine surgery      There were no vitals filed for this visit.  Visit Diagnosis:  Pain of right hand  Stiffness of joint, hand, right  Muscle weakness      Subjective Assessment - 10/20/15 1014    Subjective  No pain in my hand this am - I do sometimes use it to much and then it hurts - pain still more  in [facility] of 2nd - but I am trying to adapt  the way I use my hand    Patient Stated Goals I want my hands better and find out what I need to change  in the way I'm using them to prevent them  hurting    Currently in Pain? No/denies            [facility] OT Assessment - 10/20/15 0001    Strength   Right Hand Grip (lbs) 54   Right Hand Lateral Pinch 15 lbs   Right Hand 3 Point Pinch 11 lbs   Left Hand Grip (lbs) 58   Left Hand Lateral Pinch 18 lbs   Left Hand 3 Point Pinch 14 lbs   Right Hand AROM   R Index  MCP 0-90 85 Degrees   R Index PIP 0-100 100 Degrees   R Long  MCP 0-90 90 Degrees   R Long PIP 0-100 100 Degrees   R Ring  MCP 0-90 90 Degrees   R Ring PIP 0-100 100 Degrees   R Little  MCP 0-90 90 Degrees   R Little PIP 0-100 100 Degrees                  OT  Treatments/Exercises (OP) - 10/20/15 0001    ADLs   ADL Comments reviewed again pt  joint protection - adapt she pick up , hold objects - as well as stop before hurts, avoid sustained grip  , larger joints    Hand Exercises   Other Hand Exercises Measurements  taken - see flowsheet( grip and ROM ) -  review AROM    RUE Paraffin   Number Minutes Paraffin 10 Minutes   RUE Paraffin Location Hand   Comments decrease pain and increase ROM    LUE Paraffin   Number Minutes Paraffin 10 Minutes   LUE Paraffin Location Hand   Comments decrease pain and increase ROM                 OT Education - 10/20/15 1035    Education provided Yes   Education Details HEP , joint protection   Person(s) Educated Patient  Methods Explanation;Demonstration   Comprehension Verbalized understanding;Returned demonstration          OT Short Term Goals - 11/06/15 1042    OT SHORT TERM GOAL #1   Title Pt pain on PRWHE for hands improve at least 10-15 points    Baseline pain decrease  about 20 points    Status Achieved   OT SHORT TERM GOAL #2   Title Pt to be ind in HEP to improve  and increase  ROM and grip in in R hand    Status Achieved           OT Long Term Goals - 06-Nov-2015 1048    OT LONG TERM GOAL #1   Title Function on PRWHE improve by at least 10-15 points    Baseline Function improve with 16 points   Status Achieved   OT LONG TERM GOAL #2   Title Pt to verbalize 3 joint protection and AE to increase ease at home    Status Achieved               Plan - 2015/11/06 1041    Clinical Impression Statement Pt made great progress in ROM in R hand digits, grip and prehension strenght  - as well as pain - if pt over do things or pick/or hold ojbect the wrong way - she still have pain - pain mostly in lateral side of 2nd MC - pt met all goals - Ind in HEP and discharge at this time    OT Treatment/Interventions Self-care/ADL training;Therapeutic exercise;Patient/family  education;Splinting;Manual Therapy;Moist Heat;Contrast Bath;Parrafin   Plan discharge with HEP    OT Home Exercise Plan see pt instruction   Consulted and Agree with Plan of Care Patient          G-Codes - November 06, 2015 1049    Functional Assessment Tool Used PRWHE , ROM , grip and prehension, clinical judgement    Functional Limitation Self care   Self Care Current Status (Y8657) At least 1 percent but less than 20 percent impaired, limited or restricted   Self Care Goal Status (Q4696) At least 1 percent but less than 20 percent impaired, limited or restricted   Self Care Discharge Status [phone]) At least 1 percent but less than 20 percent impaired, limited or restricted      Problem List There are no active problems to display for this patient.   Rosalyn Gess OTR/L,CLT 11-06-2015, 10:50 AM  [place] PHYSICAL AND SPORTS MEDICINE [address], [place], [address] Phone: [phone]   Fax:  [phone]  Name: Kim Thomas MRN: [id number] Date of Birth: 1956/03/06

## 2015-10-20 NOTE — Patient Instructions (Signed)
Same HEP for ROM  Joint protection principles to cont with - pt verbalize and using it

## 2015-11-06 ENCOUNTER — Ambulatory Visit
Admission: RE | Admit: 2015-11-06 | Discharge: 2015-11-06 | Disposition: A | Discharge status: Home / Self Care | Payer: Commercial Managed Care - HMO | Source: Ambulatory Visit | Attending: Neurosurgery | Admitting: Neurosurgery

## 2015-11-06 VITALS — BP 105/52 | HR 72

## 2015-11-06 DIAGNOSIS — R200 Anesthesia of skin: Secondary | ICD-10-CM

## 2015-11-06 DIAGNOSIS — M4802 Spinal stenosis, cervical region: Secondary | ICD-10-CM

## 2015-11-06 DIAGNOSIS — R278 Other lack of coordination: Secondary | ICD-10-CM

## 2015-11-06 DIAGNOSIS — R202 Paresthesia of skin: Principal | ICD-10-CM

## 2015-11-06 MED ORDER — DIAZEPAM 5 MG PO TABS
10.0000 mg | ORAL_TABLET | Freq: Once | ORAL | Status: AC
  Administered 2015-11-06: 10 mg via ORAL

## 2015-11-06 MED ORDER — IOHEXOL 300 MG/ML  SOLN
10.0000 mL | Freq: Once | INTRAMUSCULAR | Status: AC | PRN
Start: 2015-11-06 — End: 2015-11-06
  Administered 2015-11-06: 10 mL via INTRATHECAL

## 2015-11-06 NOTE — Progress Notes (Signed)
Patient states she has been off Cymbalta, Tramadol and Trazodone for at least the past two days. 

## 2015-11-06 NOTE — Discharge Instructions (Signed)
Myelogram Discharge Instructions  1. Go home and rest quietly for the next 24 hours.  It is important to lie flat for the next 24 hours.  Get up only to go to the restroom.  You may lie in the bed or on a couch on your back, your stomach, your left side or your right side.  You may have one pillow under your head.  You may have pillows between your knees while you are on your side or under your knees while you are on your back.  2. DO NOT drive today.  Recline the seat as far back as it will go, while still wearing your seat belt, on the way home.  3. You may get up to go to the bathroom as needed.  You may sit up for 10 minutes to eat.  You may resume your normal diet and medications unless otherwise indicated.  Drink lots of extra fluids today and tomorrow.  4. The incidence of headache, nausea, or vomiting is about 5% (one in 20 patients).  If you develop a headache, lie flat and drink plenty of fluids until the headache goes away.  Caffeinated beverages may be helpful.  If you develop severe nausea and vomiting or a headache that does not go away with flat bed rest, call [phone].  5. You may resume normal activities after your 24 hours of bed rest is over; however, do not exert yourself strongly or do any heavy lifting tomorrow. If when you get up you have a headache when standing, go back to bed and force fluids for another 24 hours.  6. Call your physician for a follow-up appointment.  The results of your myelogram will be sent directly to your physician by the following day.  7. If you have any questions or if complications develop after you arrive home, please call [phone].  Discharge instructions have been explained to the patient.  The patient, or the person responsible for the patient, fully understands these instructions.    Myelogram Discharge Instructions  8. Go home and rest quietly for the next 24 hours.  It is important to lie flat for the next 24 hours.  Get up only to  go to the restroom.  You may lie in the bed or on a couch on your back, your stomach, your left side or your right side.  You may have one pillow under your head.  You may have pillows between your knees while you are on your side or under your knees while you are on your back.  9. DO NOT drive today.  Recline the seat as far back as it will go, while still wearing your seat belt, on the way home.  10. You may get up to go to the bathroom as needed.  You may sit up for 10 minutes to eat.  You may resume your normal diet and medications unless otherwise indicated.  Drink lots of extra fluids today and tomorrow.  11. The incidence of headache, nausea, or vomiting is about 5% (one in 20 patients).  If you develop a headache, lie flat and drink plenty of fluids until the headache goes away.  Caffeinated beverages may be helpful.  If you develop severe nausea and vomiting or a headache that does not go away with flat bed rest, call [phone].  12. You may resume normal activities after your 24 hours of bed rest is over; however, do not exert yourself strongly or do any heavy lifting tomorrow. If  when you get up you have a headache when standing, go back to bed and force fluids for another 24 hours.  13. Call your physician for a follow-up appointment.  The results of your myelogram will be sent directly to your physician by the following day.  14. If you have any questions or if complications develop after you arrive home, please call [phone].  Discharge instructions have been explained to the patient.  The patient, or the person responsible for the patient, fully understands these instructions.        May resume Trazodone, Tramadol and Cymbalta on Dec. 10, 2016, after 11:00 am.

## 2015-12-28 IMAGING — CT CT CERVICAL SPINE W/ CM
3 series · 8 of 14 positions shown, 9 images · IV contrast (omnipaque)
Comparison: none

CLINICAL DATA: Cervical stenosis, numbness and tingling, sensory
ataxia

EXAM:
CERVICAL MYELOGRAM
CT CERVICAL SPINE WITH INTRATHECAL CONTRAST
TECHNIQUE: An appropriate entry site was determined under fluoroscopy. Operator
donned sterile gloves and mask. Skin site was marked,prepped with
Betadine, and draped in usual sterile fashion, and infiltrated
locally with 1% lidocaine. A 22 gauge spinal needle was advanced
into the thecal sac at L3 from a right parasagittal approach. Clear
colorless CSF returned. 10 ml Omnipaque 300 were administered
intrathecally for cervical myelography, followed by axial CT
scanning of the cervical spine. I personally performed the lumbar
puncture and administered the intrathecal contrast. I also
personally supervised acquisition of the myelogram images. Coronal
and sagittal reconstructions were generated.

[Series 2: cspine soft (person_name) · axial · 0.23mm/px · z∈[-208,-150]mm · 2 of 87 slices shown]
[im 29/87  soft-tissue]
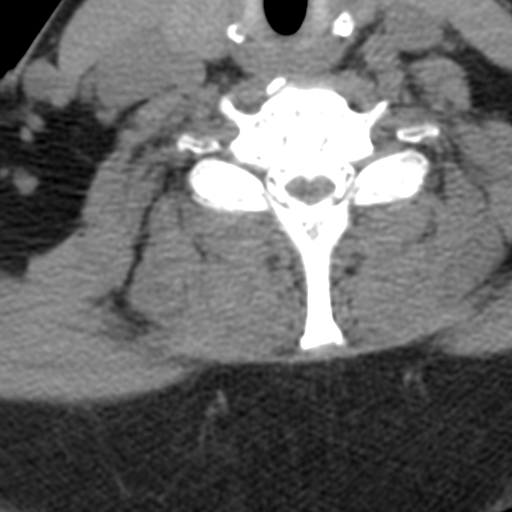
[im 58/87  soft-tissue]
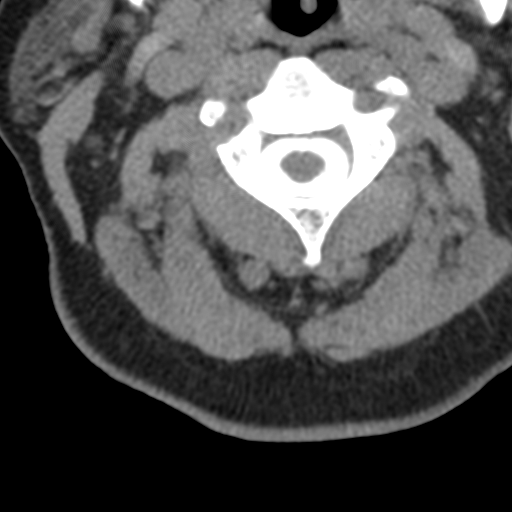

[Series 3: c spine bone · axial · 0.23mm/px · z∈[-224,-136]mm · 3 of 88 slices shown]
[im 22/88  bone]
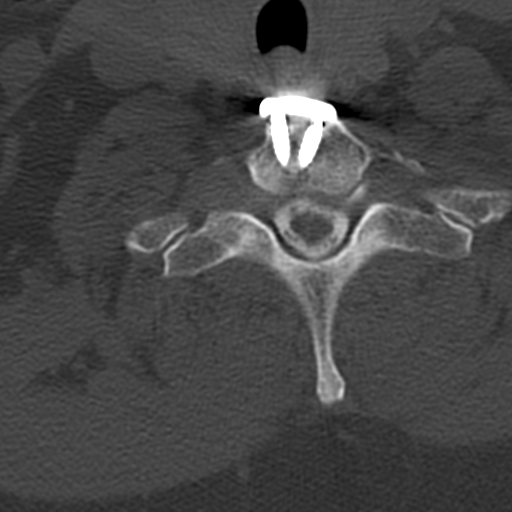
[im 44/88  bone]
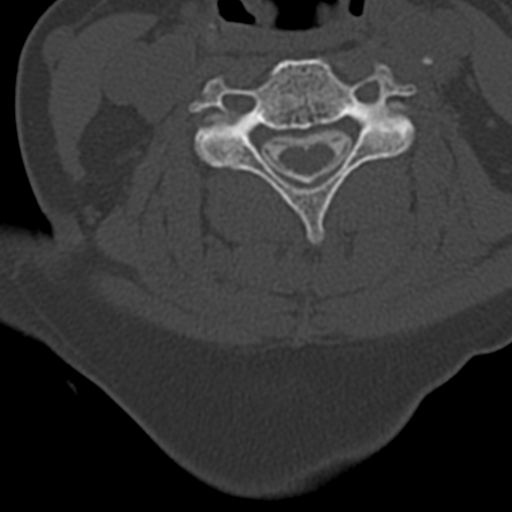
[im 66/88  bone]
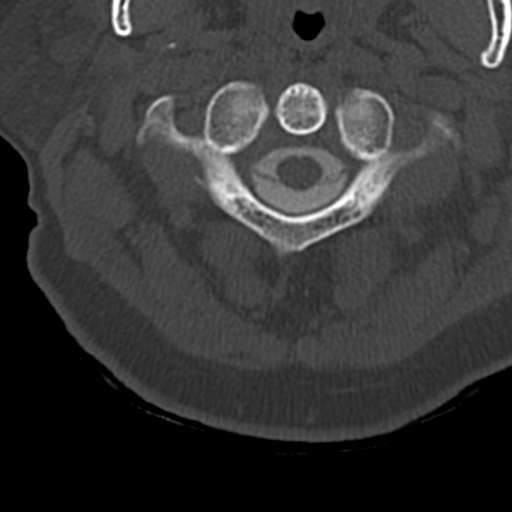

[Series 8: angled axial · axial · 0.23mm/px · z∈[-240,-156]mm · 3 of 87 slices shown, 4 images]
[im 22/87  soft-tissue]
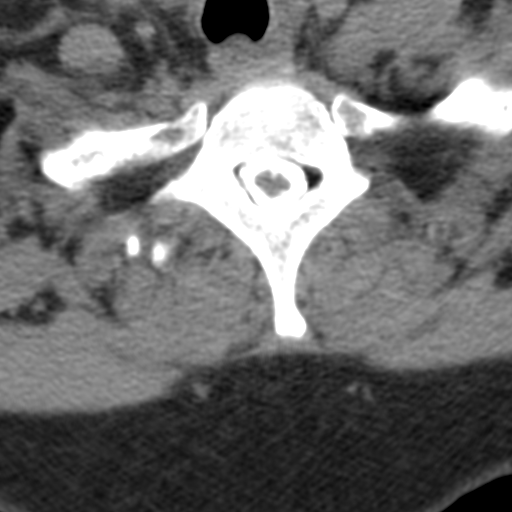
[im 22/87  bone]
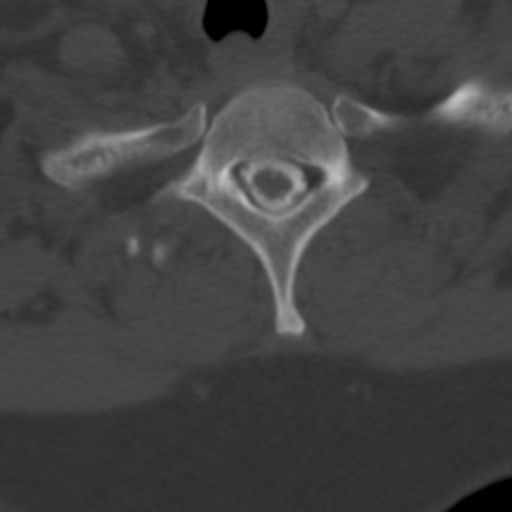
[im 44/87  bone]
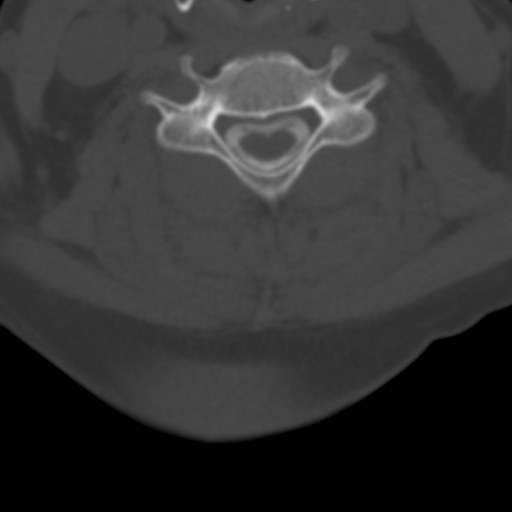
[im 65/87  bone]
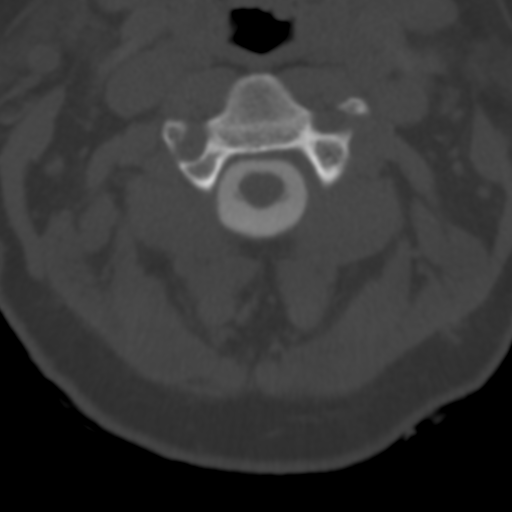

[8 of 14 positions shown; findings below may reference images not displayed]

FINDINGS: Normal alignment. No prevertebral soft tissue swelling. Negative for
fracture. No dynamic instability on lateral flexion/extension films.

C2-3: Negative

C3-4: Minimal central bulge. No cord contact, distortion or
flattening. Central canal and foramina patent.

C4-5: Small right paracentral protrusion with associated endplate
spurs. Minimal anterior cord flattening on the right. Mild spinal
stenosis. Foramina patent.

C5-6: Mild narrowing of the interspace with broad mild posterior
bulge and associated endplate spurs. No cord flattening or
distortion. Bilateral foraminal stenosis right greater than left.

C6-7: Changes of instrumented ACDF. Hardware intact without
surrounding lucency. Subsidence of interbody bone graft into the
adjacent endplates. Central canal widely patent. Uncovertebral spurs
result in mild bilateral foraminal encroachment.

C7-T1: Small left posterolateral protrusion without cord contact,
flattening or distortion. No spinal stenosis. Foramina patent.

Bilateral calcified carotid bifurcation plaque. Remainder of
visualized paraspinal soft tissues unremarkable.
IMPRESSION: 1. Mild spinal stenosis C4-5 with right paracentral protrusion.
2. Broad disc bulge and endplate spurs C5-6 with foraminal stenosis,
right greater than left.
3. ACDF C6-7 without complicating features.
4. Small left posterolateral protrusion C7-T1 without compressive
pathology.

## 2015-12-28 IMAGING — RF DG MYELOGRAPHY LUMBAR INJ CERVICAL
9 series · 9 of 9 positions shown · IV contrast (omnipaque)
Comparison: none

CLINICAL DATA: Cervical stenosis, numbness and tingling, sensory
ataxia

EXAM:
CERVICAL MYELOGRAM
CT CERVICAL SPINE WITH INTRATHECAL CONTRAST
TECHNIQUE: An appropriate entry site was determined under fluoroscopy. Operator
donned sterile gloves and mask. Skin site was marked,prepped with
Betadine, and draped in usual sterile fashion, and infiltrated
locally with 1% lidocaine. A 22 gauge spinal needle was advanced
into the thecal sac at L3 from a right parasagittal approach. Clear
colorless CSF returned. 10 ml Omnipaque 300 were administered
intrathecally for cervical myelography, followed by axial CT
scanning of the cervical spine. I personally performed the lumbar
puncture and administered the intrathecal contrast. I also
personally supervised acquisition of the myelogram images. Coronal
and sagittal reconstructions were generated.

[Series 1: (hospital) · 1 of 1 slices shown]
[im 1/1]
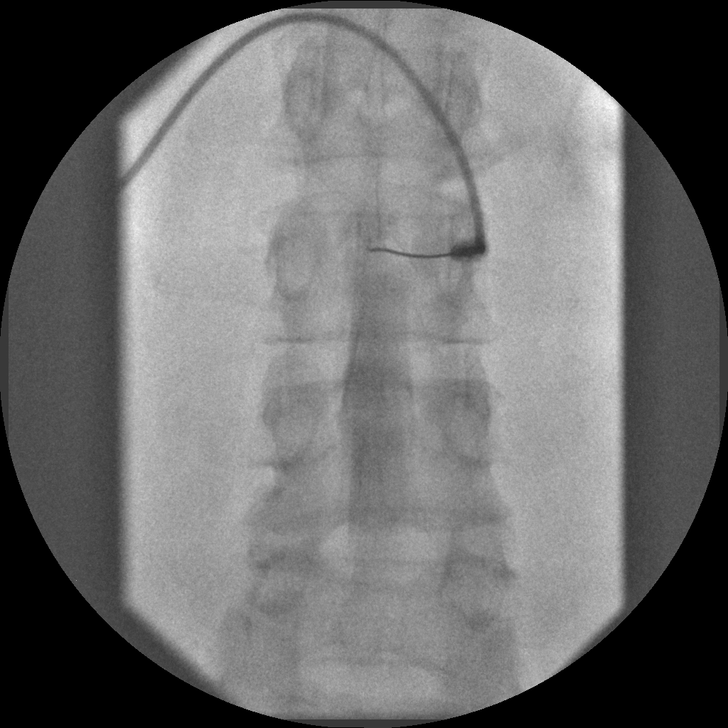

[Series 2: myelogram  white · 1 of 1 slices shown (1 of 8)]
[im 1/1]
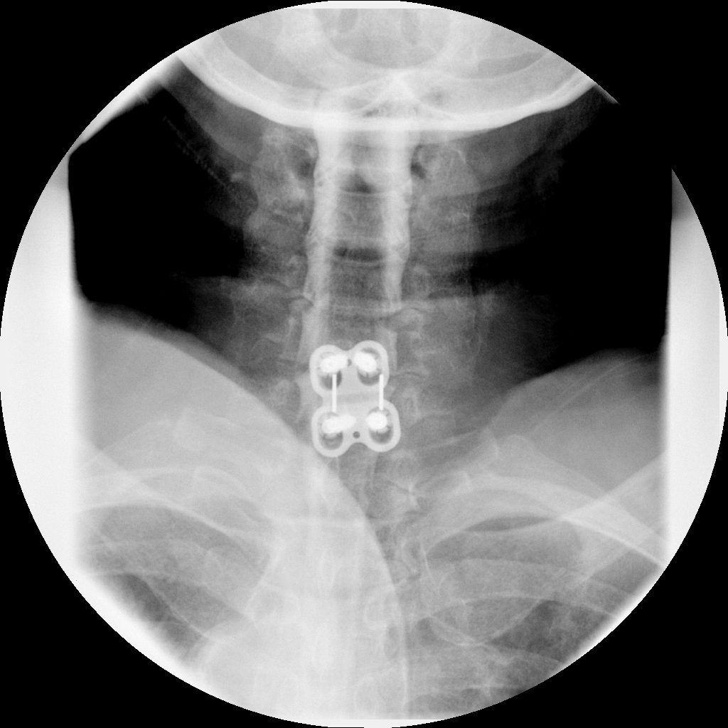

[Series 3: myelogram  white · 1 of 1 slices shown (2 of 8)]
[im 1/1]
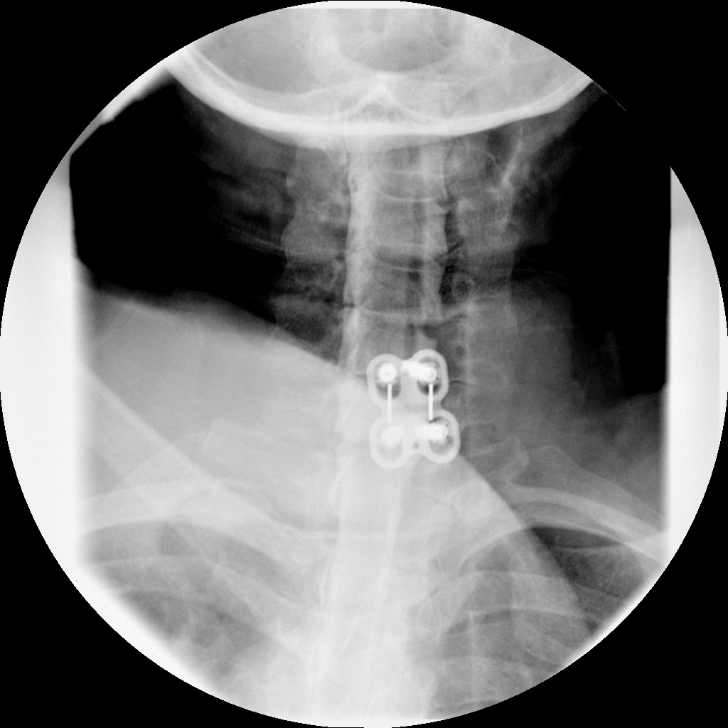

[Series 4: myelogram  white · 1 of 1 slices shown (3 of 8)]
[im 1/1]
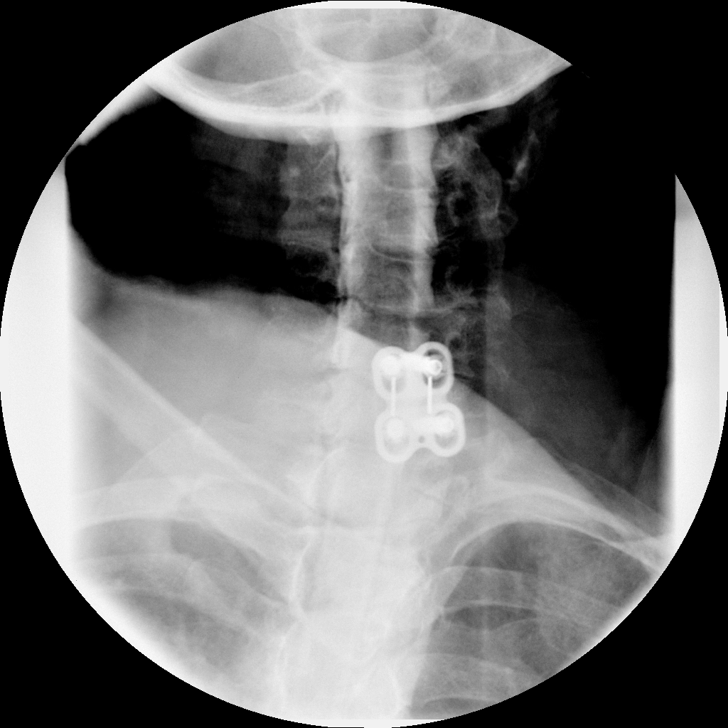

[Series 5: myelogram  white · 1 of 1 slices shown (4 of 8)]
[im 1/1]
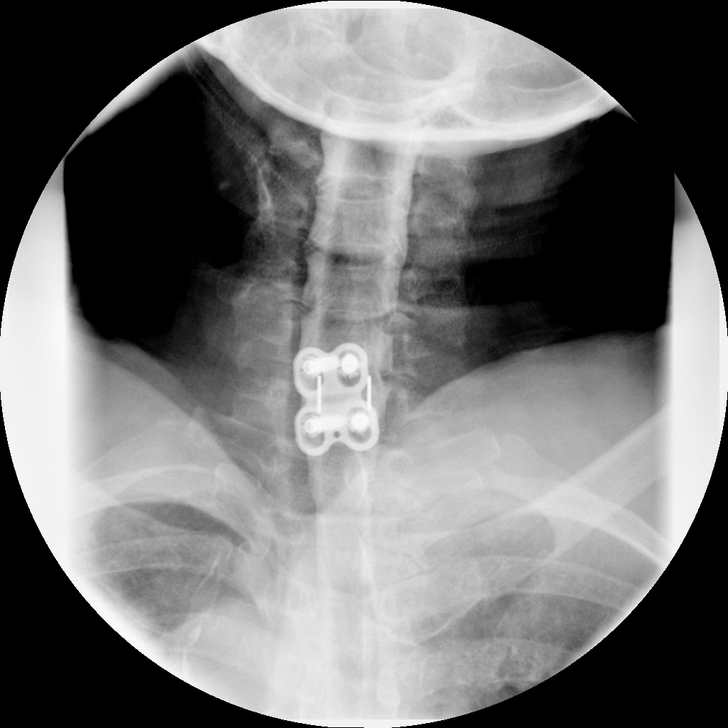

[Series 6: myelogram  white · 1 of 1 slices shown (5 of 8)]
[im 1/1]
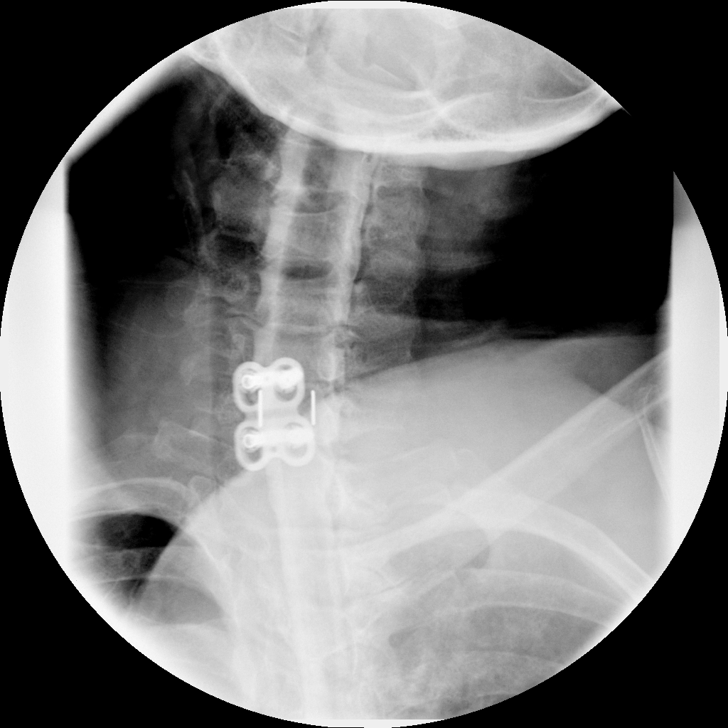

[Series 7: myelogram  white · 1 of 1 slices shown (6 of 8)]
[im 1/1]
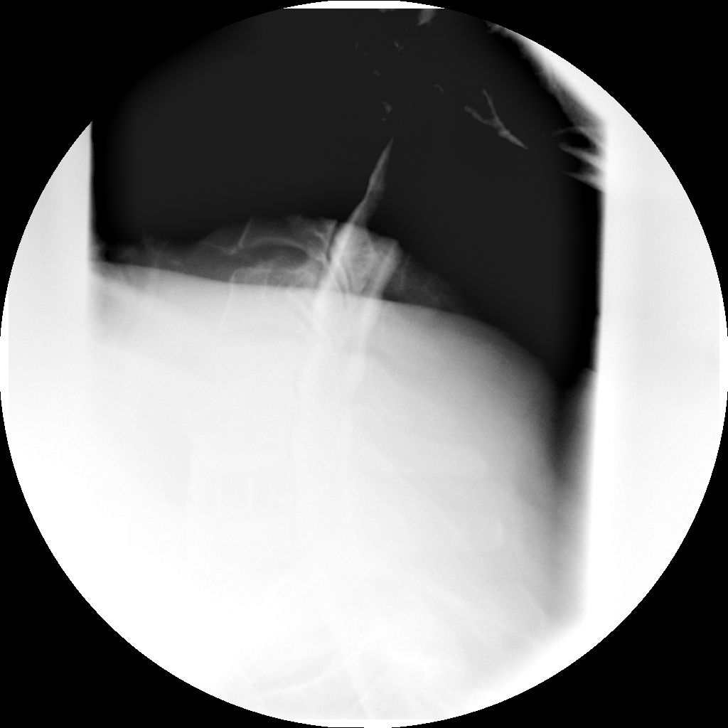

[Series 8: myelogram  white · 1 of 1 slices shown (7 of 8)]
[im 1/1]
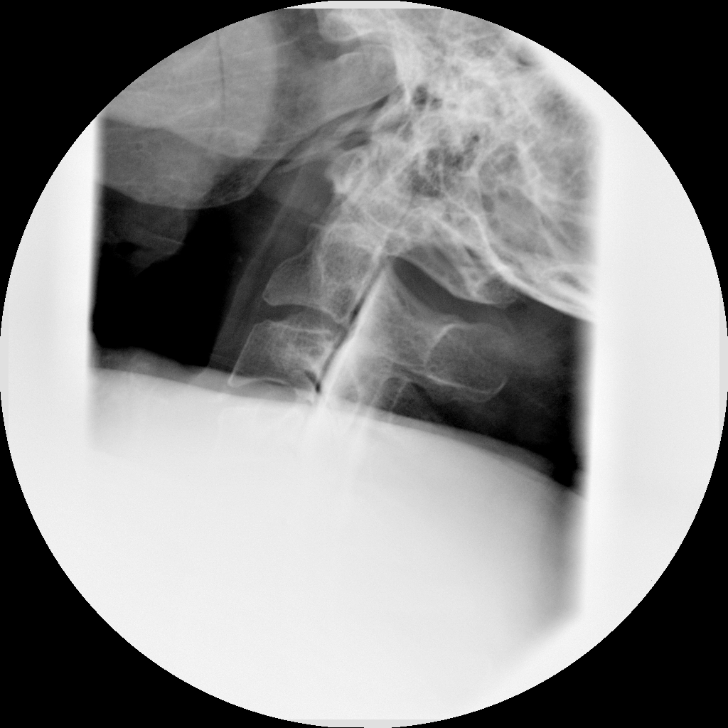

[Series 9: myelogram  white · 1 of 1 slices shown (8 of 8)]
[im 1/1]
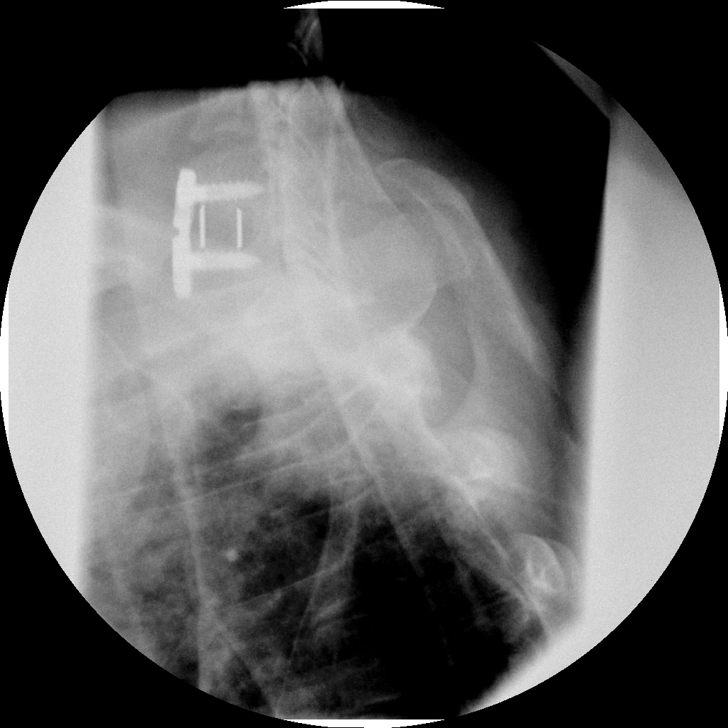

[9 of 9 positions shown; findings below may reference images not displayed]

FINDINGS: Normal alignment. No prevertebral soft tissue swelling. Negative for
fracture. No dynamic instability on lateral flexion/extension films.

C2-3: Negative

C3-4: Minimal central bulge. No cord contact, distortion or
flattening. Central canal and foramina patent.

C4-5: Small right paracentral protrusion with associated endplate
spurs. Minimal anterior cord flattening on the right. Mild spinal
stenosis. Foramina patent.

C5-6: Mild narrowing of the interspace with broad mild posterior
bulge and associated endplate spurs. No cord flattening or
distortion. Bilateral foraminal stenosis right greater than left.

C6-7: Changes of instrumented ACDF. Hardware intact without
surrounding lucency. Subsidence of interbody bone graft into the
adjacent endplates. Central canal widely patent. Uncovertebral spurs
result in mild bilateral foraminal encroachment.

C7-T1: Small left posterolateral protrusion without cord contact,
flattening or distortion. No spinal stenosis. Foramina patent.

Bilateral calcified carotid bifurcation plaque. Remainder of
visualized paraspinal soft tissues unremarkable.
IMPRESSION: 1. Mild spinal stenosis C4-5 with right paracentral protrusion.
2. Broad disc bulge and endplate spurs C5-6 with foraminal stenosis,
right greater than left.
3. ACDF C6-7 without complicating features.
4. Small left posterolateral protrusion C7-T1 without compressive
pathology.

## 2015-12-29 ENCOUNTER — Ambulatory Visit (INDEPENDENT_AMBULATORY_CARE_PROVIDER_SITE_OTHER): Payer: Commercial Managed Care - HMO | Admitting: Podiatry

## 2015-12-29 ENCOUNTER — Encounter: Payer: Self-pay | Admitting: Podiatry

## 2015-12-29 ENCOUNTER — Ambulatory Visit (INDEPENDENT_AMBULATORY_CARE_PROVIDER_SITE_OTHER): Payer: Commercial Managed Care - HMO

## 2015-12-29 VITALS — BP 113/73 | HR 90 | Resp 18

## 2015-12-29 DIAGNOSIS — R52 Pain, unspecified: Secondary | ICD-10-CM | POA: Diagnosis not present

## 2015-12-29 DIAGNOSIS — M2040 Other hammer toe(s) (acquired), unspecified foot: Secondary | ICD-10-CM

## 2015-12-29 DIAGNOSIS — M216X9 Other acquired deformities of unspecified foot: Secondary | ICD-10-CM | POA: Diagnosis not present

## 2015-12-29 NOTE — Progress Notes (Signed)
   Subjective:    Patient ID: Kim Thomas, female    DOB: 1956/03/06, 60 y.o.   MRN: [id number]  HPI  60 year old female presents to the office today for pain to her left which as been going on for 2 months or more.  She states that she has surgery years ago and she feels the pin in the left foot is "out of position". She gets pain to the ball of her feet and she feels that she cannot make a complete step once the pain occurs. The pain is intermittently.   She had apparently hammertoe surgery on left 2nd and right 2nd and 3rd. She does not get pain to her right foot. No recent injury or trauma. She gets cramping to the toes.   She is on gabapentin for neuropathy.   Her A1c was 7.6, which is up.   She just moved her from [facility] in July 2016.    Review of Systems  All other systems reviewed and are negative.      Objective:   Physical Exam General: AAO x3, NAD  Dermatological: Scars are present from prior surgery which are well-healed. Skin is warm, dry and supple bilateral. Nails x 10 are well manicured; remaining integument appears unremarkable at this time. There are no open sores, no preulcerative lesions, no rash or signs of infection present.  Vascular: Dorsalis Pedis artery and Posterior Tibial artery pedal pulses are 2/4 bilateral with immedate capillary fill time. Pedal hair growth present. No varicosities and no lower extremity edema present bilateral. There is no pain with calf compression, swelling, warmth, erythema.   Neruologic: Grossly intact via light touch bilateral. Vibratory intact via tuning fork bilateral. Protective threshold with Semmes Wienstein monofilament intact to all pedal sites bilateral. Patellar and Achilles deep tendon reflexes 2+ bilateral. No Babinski or clonus noted bilateral.   Musculoskeletal: There is rigid hammertoe contractures to left second toe. There is also hammertoe contractures of lesser digits bilaterally. There is prominence the  metatarsal heads plantarly with atrophy of the fat pad. There is mild diffuse tenderness on the metatarsal heads plantarly. There is no other area pinpoint bony tenderness or pain the vibratory sensation. There is atrophy of the fat pad. MMT 5/5, ROM WNL  Gait: Unassisted, Nonantalgic.         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 -X-rays were obtained and reviewed with the patient.  -Treatment options discussed including all alternatives, risks, and complications -Etiology of symptoms were discussed -Metatarsal pads were dispensed. -Offloading pads were dispensed for the hammertoes. -Discussed surgical intervention however we will wait for her A1c to improve if she desires surgical intervention.  -Follow-up if symptoms return or wosen or sooner if any problems arise. In the meantime, encouraged to call the office with any questions, concerns, change in symptoms.   Ovid Curd, DPM   Ovid Curd, DPM

## 2016-01-01 DIAGNOSIS — M2040 Other hammer toe(s) (acquired), unspecified foot: Secondary | ICD-10-CM | POA: Insufficient documentation

## 2016-01-01 DIAGNOSIS — M216X9 Other acquired deformities of unspecified foot: Secondary | ICD-10-CM | POA: Insufficient documentation

## 2016-02-16 DIAGNOSIS — Z124 Encounter for screening for malignant neoplasm of cervix: Secondary | ICD-10-CM | POA: Diagnosis not present

## 2016-02-16 DIAGNOSIS — R87810 Cervical high risk human papillomavirus (HPV) DNA test positive: Secondary | ICD-10-CM | POA: Diagnosis not present

## 2016-02-16 DIAGNOSIS — N764 Abscess of vulva: Secondary | ICD-10-CM | POA: Diagnosis not present

## 2016-02-16 DIAGNOSIS — E119 Type 2 diabetes mellitus without complications: Secondary | ICD-10-CM | POA: Diagnosis not present

## 2016-02-16 DIAGNOSIS — I10 Essential (primary) hypertension: Secondary | ICD-10-CM | POA: Diagnosis not present

## 2016-02-16 DIAGNOSIS — M545 Low back pain: Secondary | ICD-10-CM | POA: Diagnosis not present

## 2016-02-16 DIAGNOSIS — F331 Major depressive disorder, recurrent, moderate: Secondary | ICD-10-CM | POA: Diagnosis not present

## 2016-02-16 LAB — HM PAP SMEAR: HM Pap smear: NEGATIVE

## 2016-02-29 ENCOUNTER — Other Ambulatory Visit: Payer: Self-pay | Admitting: Nurse Practitioner

## 2016-02-29 ENCOUNTER — Ambulatory Visit
Admission: RE | Admit: 2016-02-29 | Discharge: 2016-02-29 | Disposition: A | Discharge status: Home / Self Care | Payer: Medicare Other | Source: Ambulatory Visit | Attending: Nurse Practitioner | Admitting: Nurse Practitioner

## 2016-02-29 ENCOUNTER — Inpatient Hospital Stay: Admission: RE | Admit: 2016-02-29 | Payer: Self-pay | Source: Ambulatory Visit

## 2016-02-29 DIAGNOSIS — M064 Inflammatory polyarthropathy: Secondary | ICD-10-CM | POA: Diagnosis not present

## 2016-02-29 DIAGNOSIS — E1165 Type 2 diabetes mellitus with hyperglycemia: Secondary | ICD-10-CM | POA: Diagnosis not present

## 2016-02-29 DIAGNOSIS — N183 Chronic kidney disease, stage 3 (moderate): Secondary | ICD-10-CM | POA: Diagnosis not present

## 2016-02-29 DIAGNOSIS — M545 Low back pain: Secondary | ICD-10-CM | POA: Diagnosis not present

## 2016-02-29 DIAGNOSIS — M542 Cervicalgia: Secondary | ICD-10-CM

## 2016-02-29 DIAGNOSIS — M549 Dorsalgia, unspecified: Secondary | ICD-10-CM | POA: Diagnosis present

## 2016-02-29 DIAGNOSIS — Z1231 Encounter for screening mammogram for malignant neoplasm of breast: Secondary | ICD-10-CM | POA: Diagnosis not present

## 2016-02-29 DIAGNOSIS — D509 Iron deficiency anemia, unspecified: Secondary | ICD-10-CM | POA: Diagnosis not present

## 2016-02-29 LAB — HM MAMMOGRAPHY: HM Mammogram: NORMAL (ref 0–4)

## 2016-03-17 DIAGNOSIS — J301 Allergic rhinitis due to pollen: Secondary | ICD-10-CM | POA: Diagnosis not present

## 2016-03-17 DIAGNOSIS — M5010 Cervical disc disorder with radiculopathy, unspecified cervical region: Secondary | ICD-10-CM | POA: Diagnosis not present

## 2016-03-17 DIAGNOSIS — I10 Essential (primary) hypertension: Secondary | ICD-10-CM | POA: Diagnosis not present

## 2016-03-17 DIAGNOSIS — G4700 Insomnia, unspecified: Secondary | ICD-10-CM | POA: Diagnosis not present

## 2016-03-17 DIAGNOSIS — E119 Type 2 diabetes mellitus without complications: Secondary | ICD-10-CM | POA: Diagnosis not present

## 2016-03-17 LAB — HEMOGLOBIN A1C: Hemoglobin A1C: 7.2

## 2016-04-12 DIAGNOSIS — M25562 Pain in left knee: Secondary | ICD-10-CM | POA: Diagnosis not present

## 2016-04-12 DIAGNOSIS — E119 Type 2 diabetes mellitus without complications: Secondary | ICD-10-CM | POA: Diagnosis not present

## 2016-04-12 DIAGNOSIS — I10 Essential (primary) hypertension: Secondary | ICD-10-CM | POA: Diagnosis not present

## 2016-04-13 ENCOUNTER — Ambulatory Visit
Admission: RE | Admit: 2016-04-13 | Discharge: 2016-04-13 | Disposition: A | Discharge status: Home / Self Care | Payer: Medicare Other | Source: Ambulatory Visit | Attending: Nurse Practitioner | Admitting: Nurse Practitioner

## 2016-04-13 ENCOUNTER — Other Ambulatory Visit: Payer: Self-pay | Admitting: Nurse Practitioner

## 2016-04-13 DIAGNOSIS — M1712 Unilateral primary osteoarthritis, left knee: Secondary | ICD-10-CM | POA: Diagnosis not present

## 2016-04-13 DIAGNOSIS — M25562 Pain in left knee: Secondary | ICD-10-CM

## 2016-04-13 DIAGNOSIS — M25462 Effusion, left knee: Secondary | ICD-10-CM | POA: Diagnosis not present

## 2016-04-20 DIAGNOSIS — R278 Other lack of coordination: Secondary | ICD-10-CM | POA: Diagnosis not present

## 2016-04-20 DIAGNOSIS — R200 Anesthesia of skin: Secondary | ICD-10-CM | POA: Diagnosis not present

## 2016-04-20 DIAGNOSIS — M25462 Effusion, left knee: Secondary | ICD-10-CM | POA: Diagnosis not present

## 2016-04-20 DIAGNOSIS — R202 Paresthesia of skin: Secondary | ICD-10-CM | POA: Diagnosis not present

## 2016-04-20 DIAGNOSIS — R29898 Other symptoms and signs involving the musculoskeletal system: Secondary | ICD-10-CM | POA: Diagnosis not present

## 2016-04-21 DIAGNOSIS — M2392 Unspecified internal derangement of left knee: Secondary | ICD-10-CM | POA: Diagnosis not present

## 2016-04-21 DIAGNOSIS — M25562 Pain in left knee: Secondary | ICD-10-CM | POA: Diagnosis not present

## 2016-04-28 ENCOUNTER — Other Ambulatory Visit: Payer: Self-pay | Admitting: Orthopedic Surgery

## 2016-04-28 DIAGNOSIS — M2392 Unspecified internal derangement of left knee: Secondary | ICD-10-CM

## 2016-04-29 ENCOUNTER — Other Ambulatory Visit: Payer: Self-pay | Admitting: Physician Assistant

## 2016-04-29 DIAGNOSIS — M2392 Unspecified internal derangement of left knee: Secondary | ICD-10-CM

## 2016-05-02 ENCOUNTER — Ambulatory Visit
Admission: RE | Admit: 2016-05-02 | Discharge: 2016-05-02 | Disposition: A | Discharge status: Home / Self Care | Payer: Medicare Other | Source: Ambulatory Visit | Attending: Physician Assistant | Admitting: Physician Assistant

## 2016-05-02 DIAGNOSIS — M2392 Unspecified internal derangement of left knee: Secondary | ICD-10-CM

## 2016-05-18 ENCOUNTER — Ambulatory Visit: Payer: Commercial Managed Care - HMO

## 2016-05-20 ENCOUNTER — Ambulatory Visit
Admission: RE | Admit: 2016-05-20 | Discharge: 2016-05-20 | Disposition: A | Discharge status: Home / Self Care | Payer: Medicare Other | Source: Ambulatory Visit | Attending: Orthopedic Surgery | Admitting: Orthopedic Surgery

## 2016-05-20 DIAGNOSIS — I10 Essential (primary) hypertension: Secondary | ICD-10-CM | POA: Insufficient documentation

## 2016-05-20 DIAGNOSIS — Z01810 Encounter for preprocedural cardiovascular examination: Secondary | ICD-10-CM | POA: Diagnosis present

## 2016-05-20 HISTORY — DX: Type 2 diabetes mellitus without complications: E11.9

## 2016-05-20 HISTORY — DX: Sleep apnea, unspecified: G47.30

## 2016-05-20 NOTE — Patient Instructions (Addendum)
  Your procedure is scheduled on: 7/5/173 Report to Day Surgery. To find out your arrival time please call [phone] between 1PM - 3PM on 05/30/16  Remember: Instructions that are not followed completely may result in serious medical risk, up to and including death, or upon the discretion of your surgeon and anesthesiologist your surgery may need to be rescheduled.    _x___ 1. Do not eat food or drink liquids after midnight. No gum chewing or hard candies.     __x__ 2. No Alcohol for 24 hours before or after surgery.   __x__ 3. Do Not Smoke For 24 Hours Prior to Your Surgery.   ____ 4. Bring all medications with you on the day of surgery if instructed.    _x___ 5. Notify your doctor if there is any change in your medical condition     (cold, fever, infections).       Do not wear jewelry, make-up, hairpins, clips or nail polish.  Do not wear lotions, powders, or perfumes. You may wear deodorant.  Do not shave 48 hours prior to surgery. Men may shave face and neck.  Do not bring valuables to the hospital.    [facility] is not responsible for any belongings or valuables.               Contacts, dentures or bridgework may not be worn into surgery.  Leave your suitcase in the car. After surgery it may be brought to your room.  For patients admitted to the hospital, discharge time is determined by your                treatment team.   Patients discharged the day of surgery will not be allowed to drive home.   Please read over the following fact sheets that you were given:   Surgical Site Infection Prevention   __x__ Take these medicines the morning of surgery with A SIP OF WATER:    1. traMADol (ULTRAM) 50 MG tablet if needed  2. gabapentin (NEURONTIN) 300 MG capsule  3. bisoprolol-hydrochlorothiazide (ZIAC) 2.5-6.25 MG tablet bring to hospital  4.  5.  6.  ____ Fleet Enema (as directed)   _x___ Use CHG Soap as directed  ____ Use inhalers on the day of surgery  ____ Stop  metformin 2 days prior to surgery    ____ Take 1/2 of usual insulin dose the night before surgery and none on the morning of surgery.   ____ Stop Coumadin/Plavix/aspirin on  __x__ Stop Anti-inflammatories on tylenol only until surgery Stop volteran Gel on 6/30   __x__ Stop supplements until after surgery.  Vitamin C  __x__ Bring C-Pap to the hospital.

## 2016-05-25 ENCOUNTER — Ambulatory Visit (INDEPENDENT_AMBULATORY_CARE_PROVIDER_SITE_OTHER): Payer: Medicare Other | Admitting: Unknown Physician Specialty

## 2016-05-25 ENCOUNTER — Encounter: Payer: Self-pay | Admitting: Unknown Physician Specialty

## 2016-05-25 VITALS — BP 129/73 | HR 91 | Temp 97.7°F | Ht 64.5 in | Wt 205.2 lb

## 2016-05-25 DIAGNOSIS — E119 Type 2 diabetes mellitus without complications: Secondary | ICD-10-CM | POA: Diagnosis not present

## 2016-05-25 DIAGNOSIS — G4730 Sleep apnea, unspecified: Secondary | ICD-10-CM | POA: Insufficient documentation

## 2016-05-25 DIAGNOSIS — M19041 Primary osteoarthritis, right hand: Secondary | ICD-10-CM | POA: Insufficient documentation

## 2016-05-25 DIAGNOSIS — I10 Essential (primary) hypertension: Secondary | ICD-10-CM | POA: Diagnosis not present

## 2016-05-25 DIAGNOSIS — M1990 Unspecified osteoarthritis, unspecified site: Secondary | ICD-10-CM | POA: Diagnosis not present

## 2016-05-25 DIAGNOSIS — N183 Chronic kidney disease, stage 3 (moderate): Secondary | ICD-10-CM

## 2016-05-25 DIAGNOSIS — E1121 Type 2 diabetes mellitus with diabetic nephropathy: Secondary | ICD-10-CM | POA: Insufficient documentation

## 2016-05-25 DIAGNOSIS — E1159 Type 2 diabetes mellitus with other circulatory complications: Secondary | ICD-10-CM | POA: Insufficient documentation

## 2016-05-25 DIAGNOSIS — I1310 Hypertensive heart and chronic kidney disease without heart failure, with stage 1 through stage 4 chronic kidney disease, or unspecified chronic kidney disease: Secondary | ICD-10-CM | POA: Insufficient documentation

## 2016-05-25 DIAGNOSIS — I152 Hypertension secondary to endocrine disorders: Secondary | ICD-10-CM | POA: Insufficient documentation

## 2016-05-25 DIAGNOSIS — G2581 Restless legs syndrome: Secondary | ICD-10-CM | POA: Insufficient documentation

## 2016-05-25 MED ORDER — METFORMIN HCL 500 MG PO TABS: 500.0000 mg | ORAL_TABLET | Freq: Two times a day (BID) | ORAL | Status: DC

## 2016-05-25 MED ORDER — DICLOFENAC SODIUM 75 MG PO TBEC: 75.0000 mg | DELAYED_RELEASE_TABLET | Freq: Two times a day (BID) | ORAL | Status: DC

## 2016-05-25 NOTE — Progress Notes (Signed)
BP 129/73 mmHg  Pulse 91  Temp(Src) 97.7 F (36.5 C)  Ht 5' 4.5" (1.638 m)  Wt 205 lb 3.2 oz (93.078 kg)  BMI 34.69 kg/m2  SpO2 97%  LMP  (LMP Unknown)   Subjective:    Patient ID: Kim Thomas, female    DOB: [birth year], 60 y.o.   MRN: [id number]  HPI: Kim Thomas is a 60 y.o. female  Chief Complaint  Patient presents with  . Establish Care   CC today is that her A1C was controlled for about a year.  States her A1C was 7 and she is trying to control it herself.  She is taking Trulicity which she doesn't like due to knot in her throat, constipation, and nausea.  Despite these side effects her blood sugar is 171,140, 142.  She states she was on Metformin in the past and this worked well.  She states her Hgb A1c is due next month  Arthritis Pt states her right hand gets inflameed and "joints get out of place.  She was taking Voltaren cream.  This was too expensive and got switched to oral Diclofenac  Family History  Problem Relation Age of Onset  . Cancer Mother     breast  . Hypertension Mother   . Hyperlipidemia Mother   . Diabetes Mother   . Glaucoma Mother   . Cataracts Mother   . Cancer Father   . Cancer Maternal Grandfather   . Alcohol abuse Neg Hx    Social History   Social History  . Marital Status: Single    Spouse Name: N/A  . Number of Children: N/A  . Years of Education: N/A   Social History Main Topics  . Smoking status: Former Smoker    Quit date: 05/21/2011  . Smokeless tobacco: Never Used  . Alcohol Use: No  . Drug Use: No  . Sexual Activity: Not Currently   Other Topics Concern  . None   Social History Narrative   Past Medical History  Diagnosis Date  . Hypertension   . Anxiety   . Chronic kidney disease   . Sleep apnea   . Diabetes mellitus without complication (HCC)   . Gout   . DDD (degenerative disc disease), cervical    Past Surgical History  Procedure Laterality Date  . Foot surgery Bilateral   . Cholecystectomy    .  Tubal ligation    . C section      x2  . Breast cyst excision Left   . Spine surgery  11/09/11    C5    Relevant past medical, surgical, family and social history reviewed and updated as indicated. Interim medical history since our last visit reviewed. Allergies and medications reviewed and updated.  Review of Systems  Per HPI unless specifically indicated above     Objective:    BP 129/73 mmHg  Pulse 91  Temp(Src) 97.7 F (36.5 C)  Ht 5' 4.5" (1.638 m)  Wt 205 lb 3.2 oz (93.078 kg)  BMI 34.69 kg/m2  SpO2 97%  LMP  (LMP Unknown)  Wt Readings from Last 3 Encounters:  05/25/16 205 lb 3.2 oz (93.078 kg)  05/20/16 208 lb (94.348 kg)    Physical Exam  Constitutional: She is oriented to person, place, and time. She appears well-developed and well-nourished. No distress.  HENT:  Head: Normocephalic and atraumatic.  Eyes: Conjunctivae and lids are normal. Right eye exhibits no discharge. Left eye exhibits no discharge. No scleral icterus.  Neck: Normal  range of motion. Neck supple. No JVD present. Carotid bruit is not present.  Cardiovascular: Normal rate, regular rhythm and normal heart sounds.   Pulmonary/Chest: Effort normal and breath sounds normal.  Abdominal: Normal appearance. There is no splenomegaly or hepatomegaly.  Musculoskeletal: Normal range of motion.  Neurological: She is alert and oriented to person, place, and time.  Skin: Skin is warm, dry and intact. No rash noted. No pallor.  Psychiatric: She has a normal mood and affect. Her behavior is normal. Judgment and thought content normal.    No results found for this or any previous visit.    Assessment & Plan:   Problem List Items Addressed This Visit      Unprioritized   Arthritis of hand, right   Diabetes (HCC) - Primary    Will change to Metformin to 500 mg BID.  Stop Trulicity.  Check kidney functions      Relevant Medications   metFORMIN (GLUCOPHAGE) 500 MG tablet   Other Relevant Orders    Comprehensive metabolic panel   Hypertension    Stable, continue present medications.        Relevant Orders   Comprehensive metabolic panel   RLS (restless legs syndrome)    Check Ferritin      Relevant Orders   Ferritin   Sleep apnea       Follow up plan: Return in about 4 weeks (around 06/22/2016).

## 2016-05-25 NOTE — Patient Instructions (Addendum)
Stop Gabapentin and Trulicity

## 2016-05-25 NOTE — Assessment & Plan Note (Signed)
Stable, continue present medications.   

## 2016-05-25 NOTE — Assessment & Plan Note (Addendum)
Will change to Metformin to 500 mg BID.  Stop Trulicity.  Check kidney functions

## 2016-05-25 NOTE — Assessment & Plan Note (Signed)
Check Ferritin

## 2016-05-26 LAB — COMPREHENSIVE METABOLIC PANEL
ALT: 19 IU/L (ref 0–32)
AST: 23 IU/L (ref 0–40)
Albumin/Globulin Ratio: 1.2 (ref 1.2–2.2)
Albumin: 4 g/dL (ref 3.6–4.8)
Alkaline Phosphatase: 82 IU/L (ref 39–117)
BUN/Creatinine Ratio: 10 — ABNORMAL LOW (ref 12–28)
BUN: 12 mg/dL (ref 8–27)
Bilirubin Total: <0.2 mg/dL (ref 0.0–1.2)
CO2: 22 mmol/L (ref 18–29)
Calcium: 9.4 mg/dL (ref 8.7–10.3)
Chloride: 103 mmol/L (ref 96–106)
Creatinine, Ser: 1.24 mg/dL — ABNORMAL HIGH (ref 0.57–1.00)
GFR calc Af Amer: 55 mL/min/{1.73_m2} — ABNORMAL LOW (ref >59)
GFR calc non Af Amer: 47 mL/min/{1.73_m2} — ABNORMAL LOW (ref >59)
Globulin, Total: 3.3 g/dL (ref 1.5–4.5)
Glucose: 81 mg/dL (ref 65–99)
Potassium: 4.3 mmol/L (ref 3.5–5.2)
Sodium: 140 mmol/L (ref 134–144)
Total Protein: 7.3 g/dL (ref 6.0–8.5)

## 2016-05-26 LAB — FERRITIN: Ferritin: 126 ng/mL (ref 15–150)

## 2016-05-27 ENCOUNTER — Encounter: Payer: Self-pay | Admitting: Unknown Physician Specialty

## 2016-06-01 ENCOUNTER — Telehealth: Payer: Self-pay | Admitting: Unknown Physician Specialty

## 2016-06-01 MED ORDER — BUPIVACAINE-EPINEPHRINE (PF) 0.5% -1:200000 IJ SOLN
INTRAMUSCULAR | Status: AC
Start: 2016-06-01 — End: 2016-06-01
  Filled 2016-06-01: qty 30

## 2016-06-01 MED ORDER — BUPIVACAINE-EPINEPHRINE (PF) 0.25% -1:200000 IJ SOLN
INTRAMUSCULAR | Status: AC
Start: 2016-06-01 — End: 2016-06-01
  Filled 2016-06-01: qty 30

## 2016-06-01 MED ORDER — ACETAMINOPHEN 10 MG/ML IV SOLN
INTRAVENOUS | Status: AC
  Filled 2016-06-01: qty 100

## 2016-06-01 NOTE — Telephone Encounter (Signed)
Pt called stated she is concerned about her sugar level readings taking 2 Metformin a day. Stated she wakes up and her reading is in the 180 range and in the evening the reading is in the 220 level. Would like to know if her kidney can tolerate 3 metformin pills per day or if she has other options. Please call pt and advise. Thanks.

## 2016-06-01 NOTE — Telephone Encounter (Signed)
Yes, she can increase to 3/day

## 2016-06-01 NOTE — Telephone Encounter (Signed)
Patient notified of Cheryl's instructions.  

## 2016-06-01 NOTE — Telephone Encounter (Signed)
Routing to provider  

## 2016-06-06 ENCOUNTER — Other Ambulatory Visit: Payer: Self-pay

## 2016-06-06 ENCOUNTER — Encounter: Payer: Self-pay | Admitting: Unknown Physician Specialty

## 2016-06-06 DIAGNOSIS — M545 Low back pain, unspecified: Secondary | ICD-10-CM | POA: Insufficient documentation

## 2016-06-06 DIAGNOSIS — N183 Chronic kidney disease, stage 3 unspecified: Secondary | ICD-10-CM | POA: Insufficient documentation

## 2016-06-06 DIAGNOSIS — F331 Major depressive disorder, recurrent, moderate: Secondary | ICD-10-CM | POA: Insufficient documentation

## 2016-06-06 DIAGNOSIS — N764 Abscess of vulva: Secondary | ICD-10-CM | POA: Insufficient documentation

## 2016-06-06 DIAGNOSIS — M25562 Pain in left knee: Secondary | ICD-10-CM | POA: Insufficient documentation

## 2016-06-06 DIAGNOSIS — J309 Allergic rhinitis, unspecified: Secondary | ICD-10-CM | POA: Insufficient documentation

## 2016-06-06 DIAGNOSIS — E1122 Type 2 diabetes mellitus with diabetic chronic kidney disease: Secondary | ICD-10-CM | POA: Insufficient documentation

## 2016-06-06 MED ORDER — CLORAZEPATE DIPOTASSIUM 3.75 MG PO TABS: 3.7500 mg | ORAL_TABLET | Freq: Every day | ORAL | Status: DC

## 2016-06-06 MED ORDER — ALLOPURINOL 100 MG PO TABS: 100.0000 mg | ORAL_TABLET | Freq: Every day | ORAL | Status: DC

## 2016-06-06 MED ORDER — BISOPROLOL-HYDROCHLOROTHIAZIDE 2.5-6.25 MG PO TABS: 1.0000 | ORAL_TABLET | Freq: Every day | ORAL | Status: DC

## 2016-06-06 NOTE — Telephone Encounter (Signed)
Pharmacy is [facility]. 

## 2016-06-08 ENCOUNTER — Other Ambulatory Visit: Payer: Self-pay

## 2016-06-08 NOTE — Telephone Encounter (Signed)
I called the patient the other day when we got the first refill request for this patient and asked her about taking this medication. She states that she does take this.

## 2016-06-13 ENCOUNTER — Telehealth: Payer: Self-pay | Admitting: Unknown Physician Specialty

## 2016-06-13 MED ORDER — METFORMIN HCL 500 MG PO TABS: 500.0000 mg | ORAL_TABLET | Freq: Three times a day (TID) | ORAL | Status: DC

## 2016-06-13 NOTE — Telephone Encounter (Signed)
Patient needs new rx for metformin sent in with directions that say 1 tablet 3 times a day. We told her that it was OK to take TID per phone note 06/01/16. Pharmacy is [facility] [place].

## 2016-06-13 NOTE — Telephone Encounter (Signed)
Pt called and would like to have her rx for metFORMIN (GLUCOPHAGE) 500 MG tablet sent to [facility] [place]. She also stated that she is suppose to be taking it 3 times a day whereas it states that its 2 times a day.

## 2016-06-20 ENCOUNTER — Ambulatory Visit: Payer: Self-pay | Admitting: Unknown Physician Specialty

## 2016-06-21 ENCOUNTER — Encounter: Payer: Self-pay | Admitting: Unknown Physician Specialty

## 2016-06-21 ENCOUNTER — Ambulatory Visit (INDEPENDENT_AMBULATORY_CARE_PROVIDER_SITE_OTHER): Payer: Medicare Other | Admitting: Unknown Physician Specialty

## 2016-06-21 DIAGNOSIS — E790 Hyperuricemia without signs of inflammatory arthritis and tophaceous disease: Secondary | ICD-10-CM | POA: Insufficient documentation

## 2016-06-21 DIAGNOSIS — R7989 Other specified abnormal findings of blood chemistry: Secondary | ICD-10-CM | POA: Diagnosis not present

## 2016-06-21 DIAGNOSIS — N183 Chronic kidney disease, stage 3 unspecified: Secondary | ICD-10-CM

## 2016-06-21 DIAGNOSIS — L0293 Carbuncle, unspecified: Secondary | ICD-10-CM | POA: Diagnosis not present

## 2016-06-21 DIAGNOSIS — M25562 Pain in left knee: Secondary | ICD-10-CM | POA: Diagnosis not present

## 2016-06-21 DIAGNOSIS — G4700 Insomnia, unspecified: Secondary | ICD-10-CM | POA: Insufficient documentation

## 2016-06-21 MED ORDER — TRAMADOL HCL 50 MG PO TABS: 50.0000 mg | ORAL_TABLET | Freq: Three times a day (TID) | ORAL | 1 refills | Status: DC | PRN

## 2016-06-21 MED ORDER — DOXYCYCLINE HYCLATE 100 MG PO TABS: 100.0000 mg | ORAL_TABLET | Freq: Every day | ORAL | 3 refills | Status: DC

## 2016-06-21 MED ORDER — DULOXETINE HCL 20 MG PO CPEP: 20.0000 mg | ORAL_CAPSULE | Freq: Every day | ORAL | 1 refills | Status: DC

## 2016-06-21 NOTE — Progress Notes (Signed)
BP 131/74 (BP Location: Left Arm, Patient Position: Sitting, Cuff Size: Large)   Pulse 81   Temp 98.3 F (36.8 C)   Ht 5' 5.5" (1.664 m)   Wt 205 lb 6.4 oz (93.2 kg)   LMP  (LMP Unknown)   SpO2 97%   BMI 33.66 kg/m    Subjective:    Patient ID: Kim Thomas, female    DOB: December 27, 1955, 60 y.o.   MRN: [id number]  HPI: Kim Thomas is a 60 y.o. female  Chief Complaint  Patient presents with  . Diabetes    1 month f/up  . Medication Refill    pt states she needs refills on doxycycline, duloxetine, and diclofenac   Diabetes: Using medications without difficulties No hypoglycemic episodes No hyperglycemic episodes Feet problems: Blood Sugars averaging:109-136 in the AM and 115-170 in the PM Trying to stick to her diet.   Last Hgb A1C: Due in August.  Not controlled before  Insomnia Pt is taking both Clorazepate and Trazadone.  She is also taking Ropinirole for her cramps.  She is taking Vitamin B 6 which seems to help.  She is on a CPAP and states she started these medications were started prior to the CPAP and she does not want to be dependent on them.    Boils Takes doxycycline  Elevated uric acid Taking Allopurinol    Relevant past medical, surgical, family and social history reviewed and updated as indicated. Interim medical history since our last visit reviewed. Allergies and medications reviewed and updated. +++++ Review of Systems  Per HPI unless specifically indicated above     Objective:    BP 131/74 (BP Location: Left Arm, Patient Position: Sitting, Cuff Size: Large)   Pulse 81   Temp 98.3 F (36.8 C)   Ht 5' 5.5" (1.664 m)   Wt 205 lb 6.4 oz (93.2 kg)   LMP  (LMP Unknown)   SpO2 97%   BMI 33.66 kg/m   Wt Readings from Last 3 Encounters:  06/21/16 205 lb 6.4 oz (93.2 kg)  05/25/16 205 lb 3.2 oz (93.1 kg)  05/20/16 208 lb (94.3 kg)    Physical Exam  Constitutional: She is oriented to person, place, and time. She appears well-developed and  well-nourished. No distress.  HENT:  Head: Normocephalic and atraumatic.  Eyes: Conjunctivae and lids are normal. Right eye exhibits no discharge. Left eye exhibits no discharge. No scleral icterus.  Neck: Normal range of motion. Neck supple. No JVD present. Carotid bruit is not present.  Cardiovascular: Normal rate, regular rhythm and normal heart sounds.   Pulmonary/Chest: Effort normal and breath sounds normal.  Abdominal: Normal appearance. There is no splenomegaly or hepatomegaly.  Musculoskeletal: Normal range of motion.  Neurological: She is alert and oriented to person, place, and time.  Skin: Skin is warm, dry and intact. No rash noted. No pallor.  Psychiatric: She has a normal mood and affect. Her behavior is normal. Judgment and thought content normal.    Results for orders placed or performed in visit on 05/25/16  Comprehensive metabolic panel  Result Value Ref Range   Glucose 81 65 - 99 mg/dL   BUN 12 8 - 27 mg/dL   Creatinine, Ser 4.09 (H) 0.57 - 1.00 mg/dL   GFR calc non Af Amer 47 (L) >59 mL/min/1.73   GFR calc Af Amer 55 (L) >59 mL/min/1.73   BUN/Creatinine Ratio 10 (L) 12 - 28   Sodium 140 134 - 144 mmol/L   Potassium 4.3  3.5 - 5.2 mmol/L   Chloride 103 96 - 106 mmol/L   CO2 22 18 - 29 mmol/L   Calcium 9.4 8.7 - 10.3 mg/dL   Total Protein 7.3 6.0 - 8.5 g/dL   Albumin 4.0 3.6 - 4.8 g/dL   Globulin, Total 3.3 1.5 - 4.5 g/dL   Albumin/Globulin Ratio 1.2 1.2 - 2.2   Bilirubin Total <0.2 0.0 - 1.2 mg/dL   Alkaline Phosphatase 82 39 - 117 IU/L   AST 23 0 - 40 IU/L   ALT 19 0 - 32 IU/L  Ferritin  Result Value Ref Range   Ferritin 126 15 - 150 ng/mL      Assessment & Plan:   Problem List Items Addressed This Visit      Unprioritized   CKD (chronic kidney disease), stage III    Stop NSAID       Elevated uric acid in blood    Taking Allopurinol but doesn't seem to have gout.  Told she has OA and saw a Rheumatologist.  Stop Allopurinol      Insomnia    Pt  would like to stop Ropinirole, Chlorazepate, and Trazadone.  Ok to stop these.  References given for sleep.        Pain in left knee    Taking Diclofenac for OA of hands and knee.  Discussed that Diclofenac can damage kidneys.        Recurrent boils    Refill doxycycline       Other Visit Diagnoses   None.      Follow up plan: Return in about 4 weeks (around 07/19/2016).

## 2016-06-21 NOTE — Assessment & Plan Note (Signed)
Stop NSAID. 

## 2016-06-21 NOTE — Assessment & Plan Note (Signed)
Refill doxycycline. 

## 2016-06-21 NOTE — Assessment & Plan Note (Addendum)
Taking Allopurinol but doesn't seem to have gout.  Told she has OA and saw a Rheumatologist.  Stop Allopurinol

## 2016-06-21 NOTE — Patient Instructions (Addendum)
End the Insomnia Struggle: A Step-by-Step Guide to Help You Get to Sleep and Stay Asleep Paperback - August 29, 2015  Add Magnesium supplement in addition to B6  Other supplements that may help are Chamomile and LTheanine  Before I go to bed I take Magnesium, L theanine, and 2 tabsps of tart cherry juice concentrate  Insomnia Insomnia is a sleep disorder that makes it difficult to fall asleep or to stay asleep. Insomnia can cause tiredness (fatigue), low energy, difficulty concentrating, mood swings, and poor performance at work or school.  There are three different ways to classify insomnia:  Difficulty falling asleep.  Difficulty staying asleep.  Waking up too early in the morning. Any type of insomnia can be long-term (chronic) or short-term (acute). Both are common. Short-term insomnia usually lasts for three months or less. Chronic insomnia occurs at least three times a week for longer than three months. CAUSES  Insomnia may be caused by another condition, situation, or substance, such as:  Anxiety.  Certain medicines.  Gastroesophageal reflux disease (GERD) or other gastrointestinal conditions.  Asthma or other breathing conditions.  Restless legs syndrome, sleep apnea, or other sleep disorders.  Chronic pain.  Menopause. This may include hot flashes.  Stroke.  Abuse of alcohol, tobacco, or illegal drugs.  Depression.  Caffeine.   Neurological disorders, such as Alzheimer disease.  An overactive thyroid (hyperthyroidism). The cause of insomnia may not be known. RISK FACTORS Risk factors for insomnia include:  Gender. Women are more commonly affected than men.  Age. Insomnia is more common as you get older.  Stress. This may involve your professional or personal life.  Income. Insomnia is more common in people with lower income.  Lack of exercise.   Irregular work schedule or night shifts.  Traveling between different time zones. SIGNS AND  SYMPTOMS If you have insomnia, trouble falling asleep or trouble staying asleep is the main symptom. This may lead to other symptoms, such as:  Feeling fatigued.  Feeling nervous about going to sleep.  Not feeling rested in the morning.  Having trouble concentrating.  Feeling irritable, anxious, or depressed. TREATMENT  Treatment for insomnia depends on the cause. If your insomnia is caused by an underlying condition, treatment will focus on addressing the condition. Treatment may also include:   Medicines to help you sleep.  Counseling or therapy.  Lifestyle adjustments. HOME CARE INSTRUCTIONS   Take medicines only as directed by your health care provider.  Keep regular sleeping and waking hours. Avoid naps.  Keep a sleep diary to help you and your health care provider figure out what could be causing your insomnia. Include:   When you sleep.  When you wake up during the night.  How well you sleep.   How rested you feel the next day.  Any side effects of medicines you are taking.  What you eat and drink.   Make your bedroom a comfortable place where it is easy to fall asleep:  Put up shades or special blackout curtains to block light from outside.  Use a white noise machine to block noise.  Keep the temperature cool.   Exercise regularly as directed by your health care provider. Avoid exercising right before bedtime.  Use relaxation techniques to manage stress. Ask your health care provider to suggest some techniques that may work well for you. These may include:  Breathing exercises.  Routines to release muscle tension.  Visualizing peaceful scenes.  Cut back on alcohol, caffeinated beverages, and cigarettes,  especially close to bedtime. These can disrupt your sleep.  Do not overeat or eat spicy foods right before bedtime. This can lead to digestive discomfort that can make it hard for you to sleep.  Limit screen use before bedtime. This  includes:  Watching TV.  Using your smartphone, tablet, and computer.  Stick to a routine. This can help you fall asleep faster. Try to do a quiet activity, brush your teeth, and go to bed at the same time each night.  Get out of bed if you are still awake after 15 minutes of trying to sleep. Keep the lights down, but try reading or doing a quiet activity. When you feel sleepy, go back to bed.  Make sure that you drive carefully. Avoid driving if you feel very sleepy.  Keep all follow-up appointments as directed by your health care provider. This is important. SEEK MEDICAL CARE IF:   You are tired throughout the day or have trouble in your daily routine due to sleepiness.  You continue to have sleep problems or your sleep problems get worse. SEEK IMMEDIATE MEDICAL CARE IF:   You have serious thoughts about hurting yourself or someone else.   This information is not intended to replace advice given to you by your health care provider. Make sure you discuss any questions you have with your health care provider.   ------------------------------------------------------------------------------------------------------------- For you, Tylenol is safer!  f you have chronic pain and are looking for alternatives to medication and surgery, you have a lot of options.   However, not all alternative pain treatments work. Some can even be risky. Some alternative treatments may help with pain from bad backs, osteoarthritis, and headaches, but have no effect on chronic pain from fibromyalgia or diabetic nerve damage.  Here's a rundown of the most commonly used alternative treatments for chronic pain.  Acupuncture. Once seen as bizarre, acupuncture is rapidly becoming a mainstream treatment for pain. Studies have found that it works for pain caused by many conditions, including fibromyalgia, osteoarthritis, back injuries, and sports injuries. How does it work? No one's quite sure. It could release  pain-numbing chemicals in the body. Or it might block the pain signals coming from the nerves.  Exercise. Motion is lotion Going for a walk or swim isn't a treatment, exactly. But regular physical activity has big benefits for people with many different painful conditions. Study after study has found that physical activity can help relieve chronic pain, as well as boost energy and mood.   This is particularly true of pain related to arthritis .    Chiropractic manipulation. Although mainstream medicine has traditionally regarded spinal manipulation with suspicion, it's becoming a more accepted treatment.  I think many people respond will th chiropractic treatment.    Supplements and vitamins. There is evidence that certain dietary supplements and vitamins can help with certain types of pain. Fish oil and flaxseed oil is often used to reduce pain associated with swelling. Topical capsaicin, derived from chili peppers, may help with arthritis, diabetic nerve pain, and other conditions. There's evidence that glucosamine can help relieve moderate to severe pain from osteoarthritis in the knee.  A spice Tumeric is used my many to treat chronic pain.  Curcumin is the active ingredient and thought to have anti-inflammatory effects and reduces pain and stiffness.  This can be found as capsules or extract (more likely to be free of contaminants). For OA: Capsule, typically 400 mg to 600 mg, three times per day; or 0.5  g to 1 g of powdered root up to 3 g per day. For RA: 500 mg twice daily.  It's best absorbed if it contains black pepper.  Tart cherry juice is a natural anti-inflammatory agent.  We sometimes hear from people who would like to know where to find cherries out of season. Some report that their local market does not carry cherry juice. There are a number of reputable online vendors who could supply cherry concentrate so you can use cherry juice to see if it helps your arthritic joints. Studies have been  done with powdered [place] cherries, CherryPURE. The usual dose is 480 mg/day. Manufacturers often combine several ingredients in 1 pill.  One of my patient is taking [employer] Fib[employer] support she finds helpful.  Another I know is Zyflamed.  Please let me know if you find any products helpful for you that I can pass on to others  But when it comes to supplements, you have to be careful. High doses of B6 can damage the nerves.   Some studies suggest that supplements such as ginkgo biloba and ginseng can thin the blood and increase the risk of bleeding. This could lead to serious consequences for anyone getting surgery for chronic pain.  Finally, supplements don't always contain what they claim as supplement manufacturers are not regulated by the FDA.  I get very confused with the thousands of supplements available and which to choose.  Brands to consider: [employer], [employer], [employer], Garden of Life, [employer], Dannielle Huh and Rosetta Posner   I usually go on [employer] and look for the highly rated products.  [employer] or [employer] are great resources and have done the research for you.     Cognitive Behavioral Therapy. Some people with chronic pain balk at the idea of seeing a therapist -- they think it implies that their pain isn't real. But studies show that depression and chronic pain often go together. Chronic pain can cause or worsen depression; depression can lower a person's tolerance for pain.  Stress-reduction techniques. There are number of approaches, including: Yoga. There's good evidence that yoga can help with chronic pain,  specifically fibromyalgia, neck pain, back pain, and arthritis. Relaxation therapy. This is actually a category of techniques that help people calm the body and release tension -- a process that might also reduce pain. Some approaches teach people how to focus on their breathing. Research shows that relaxation therapy can help with fibromyalgia,  headache, osteoarthritis, and other conditions. Hypnosis. Studies have found this approach helpful with different sorts of pain, like back pain, repetitive strain injuries, and cancer pain. Guided imagery. Research shows that guided imagery can help with conditions like headache pain, cancer pain, osteoarthritis, and fibromyalgia. How does it work? An expert would teach you ways to direct your thoughts by focusing on specific images. Music therapy. This approach gets people to either perform or listen to music. Studies have found that it can help with many different pain conditions, like osteoarthritis and cancer pain. Biofeedback. This approach teaches you how to control normally unconscious bodily functions, like blood pressure or your heart rate. Studies have found that it can help with headaches, fibromyalgia, and other conditions. Massage. It's undeniably relaxing. And there's some evidence that massage can help ease pain from rheumatoid arthritis, neck and back injuries, and fibromyalgia.  Risky Alternative Pain Treatments Experts say you should keep your expectations for alternative pain treatments modest -- especially when it comes to "miracle cures." Controlling chronic  pain is not simple. A single supplement, device, or treatment is not going to make your chronic pain disappear. You a need to be suspicious of anyone pushing a treatment when the financial motive is blatant. That doesn't only apply to pain treatments advertised on dubious web sites asking for your credit card number.  Experts say that you should try to keep up to date with research into alternative treatments for chronic pain. The options for people with chronic pain are always growing -- and some of the older treatments of today might become the mainstream treatments of tomorrow.

## 2016-06-21 NOTE — Assessment & Plan Note (Signed)
Pt would like to stop Ropinirole, Chlorazepate, and Trazadone.  Ok to stop these.  References given for sleep.

## 2016-06-21 NOTE — Assessment & Plan Note (Signed)
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  Discussed that Diclofenac can damage kidneys.

## 2016-06-24 ENCOUNTER — Ambulatory Visit: Payer: Medicare Other | Admitting: Unknown Physician Specialty

## 2016-07-13 ENCOUNTER — Telehealth: Payer: Self-pay | Admitting: Unknown Physician Specialty

## 2016-07-13 NOTE — Telephone Encounter (Signed)
Pt has surgery on 07/18/16 and wanted to let us know she would come in at a later date for labs and follow up.

## 2016-07-13 NOTE — Telephone Encounter (Signed)
Routing to provider, FYI.  

## 2016-07-15 ENCOUNTER — Encounter
Admission: RE | Admit: 2016-07-15 | Discharge: 2016-07-15 | Disposition: A | Discharge status: Home / Self Care | Payer: Medicare Other | Source: Ambulatory Visit | Attending: Orthopedic Surgery | Admitting: Orthopedic Surgery

## 2016-07-15 NOTE — Patient Instructions (Addendum)
  Your procedure is scheduled on: 07-18-16 Report to Same Day Surgery 2nd floor medical mall To find out your arrival time please call [phone] between 1PM - 3PM on 07-15-16  Remember: Instructions that are not followed completely may result in serious medical risk, up to and including death, or upon the discretion of your surgeon and anesthesiologist your surgery may need to be rescheduled.    _x___ 1. Do not eat food or drink liquids after midnight. No gum chewing or hard candies.     __x__ 2. No Alcohol for 24 hours before or after surgery.   __x__3. No Smoking for 24 prior to surgery.   ____  4. Bring all medications with you on the day of surgery if instructed.    __x__ 5. Notify your doctor if there is any change in your medical condition     (cold, fever, infections).     Do not wear jewelry, make-up, hairpins, clips or nail polish.  Do not wear lotions, powders, or perfumes. You may wear deodorant.  Do not shave 48 hours prior to surgery. Men may shave face and neck.  Do not bring valuables to the hospital.    [facility] is not responsible for any belongings or valuables.               Contacts, dentures or bridgework may not be worn into surgery.  Leave your suitcase in the car. After surgery it may be brought to your room.  For patients admitted to the hospital, discharge time is determined by your treatment team.   Patients discharged the day of surgery will not be allowed to drive home.    Please read over the following fact sheets that you were given:   [facility] Preparing for Surgery and or MRSA Information   _x___ Take these medicines the morning of surgery with A SIP OF WATER:    1. CYMBALTA  2.  3.  4.  5.  6.  ____ Fleet Enema (as directed)   _x___ Use CHG Soap or sage wipes as directed on instruction sheet   ____ Use inhalers on the day of surgery and bring to hospital day of surgery  _X___ Stop metformin 2 days prior to surgery-LAST DOSE TODAY  (FRIDAY 07-15-16)    ____ Take 1/2 of usual insulin dose the night before surgery and none on the morning of surgery.   ____ Stop aspirin or coumadin, or plavix  _x__ Stop Anti-inflammatories such as Advil, Aleve, Ibuprofen, Motrin, Naproxen,          Naprosyn, Goodies powders or aspirin products. Ok to take Tylenol OR TRAMDOL   _X___ Stop supplements until after surgery-STOP VITAMIN C, VIT B 6 AND ULTIMATE JOINT REPAIR NOW  ____ Bring C-Pap to the hospital.

## 2016-07-18 ENCOUNTER — Ambulatory Visit
Admission: RE | Admit: 2016-07-18 | Discharge: 2016-07-18 | Disposition: A | Discharge status: Home / Self Care | Payer: Medicare Other | Source: Ambulatory Visit | Attending: Orthopedic Surgery | Admitting: Orthopedic Surgery

## 2016-07-18 ENCOUNTER — Encounter: Payer: Self-pay | Admitting: Orthopedic Surgery

## 2016-07-18 ENCOUNTER — Encounter
Admission: RE | Disposition: A | Discharge status: Home / Self Care | Payer: Self-pay | Source: Ambulatory Visit | Attending: Orthopedic Surgery

## 2016-07-18 ENCOUNTER — Ambulatory Visit: Payer: Medicare Other | Admitting: Anesthesiology

## 2016-07-18 DIAGNOSIS — Z809 Family history of malignant neoplasm, unspecified: Secondary | ICD-10-CM | POA: Diagnosis not present

## 2016-07-18 DIAGNOSIS — Z79899 Other long term (current) drug therapy: Secondary | ICD-10-CM | POA: Diagnosis not present

## 2016-07-18 DIAGNOSIS — Z7984 Long term (current) use of oral hypoglycemic drugs: Secondary | ICD-10-CM | POA: Diagnosis not present

## 2016-07-18 DIAGNOSIS — M1990 Unspecified osteoarthritis, unspecified site: Secondary | ICD-10-CM | POA: Insufficient documentation

## 2016-07-18 DIAGNOSIS — N189 Chronic kidney disease, unspecified: Secondary | ICD-10-CM | POA: Diagnosis not present

## 2016-07-18 DIAGNOSIS — Z9049 Acquired absence of other specified parts of digestive tract: Secondary | ICD-10-CM | POA: Diagnosis not present

## 2016-07-18 DIAGNOSIS — M94262 Chondromalacia, left knee: Secondary | ICD-10-CM | POA: Insufficient documentation

## 2016-07-18 DIAGNOSIS — Z881 Allergy status to other antibiotic agents status: Secondary | ICD-10-CM | POA: Diagnosis not present

## 2016-07-18 DIAGNOSIS — M23242 Derangement of anterior horn of lateral meniscus due to old tear or injury, left knee: Secondary | ICD-10-CM | POA: Diagnosis not present

## 2016-07-18 DIAGNOSIS — D649 Anemia, unspecified: Secondary | ICD-10-CM | POA: Diagnosis not present

## 2016-07-18 DIAGNOSIS — Z981 Arthrodesis status: Secondary | ICD-10-CM | POA: Diagnosis not present

## 2016-07-18 DIAGNOSIS — E1122 Type 2 diabetes mellitus with diabetic chronic kidney disease: Secondary | ICD-10-CM | POA: Insufficient documentation

## 2016-07-18 DIAGNOSIS — M2392 Unspecified internal derangement of left knee: Secondary | ICD-10-CM | POA: Diagnosis present

## 2016-07-18 DIAGNOSIS — F418 Other specified anxiety disorders: Secondary | ICD-10-CM | POA: Diagnosis not present

## 2016-07-18 DIAGNOSIS — Z885 Allergy status to narcotic agent status: Secondary | ICD-10-CM | POA: Insufficient documentation

## 2016-07-18 DIAGNOSIS — M23252 Derangement of posterior horn of lateral meniscus due to old tear or injury, left knee: Secondary | ICD-10-CM | POA: Diagnosis not present

## 2016-07-18 DIAGNOSIS — I129 Hypertensive chronic kidney disease with stage 1 through stage 4 chronic kidney disease, or unspecified chronic kidney disease: Secondary | ICD-10-CM | POA: Diagnosis not present

## 2016-07-18 HISTORY — PX: KNEE ARTHROSCOPY: SHX127

## 2016-07-18 HISTORY — PX: KNEE ARTHROSCOPY WITH LATERAL MENISECTOMY: SHX6193

## 2016-07-18 LAB — POCT I-STAT 4, (NA,K, GLUC, HGB,HCT)
Glucose, Bld: 117 mg/dL — ABNORMAL HIGH (ref 65–99)
HCT: 39 % (ref 36.0–46.0)
Hemoglobin: 13.3 g/dL (ref 12.0–15.0)
Potassium: 4 mmol/L (ref 3.5–5.1)
Sodium: 143 mmol/L (ref 135–145)

## 2016-07-18 LAB — GLUCOSE, CAPILLARY
Glucose-Capillary: 131 mg/dL — ABNORMAL HIGH (ref 65–99)
Glucose-Capillary: 80 mg/dL (ref 65–99)

## 2016-07-18 SURGERY — ARTHROSCOPY, KNEE
Anesthesia: General | Laterality: Left

## 2016-07-18 MED ORDER — PHENYLEPHRINE HCL 10 MG/ML IJ SOLN
INTRAMUSCULAR | Status: DC | PRN
  Administered 2016-07-18 (×2): 100 ug via INTRAVENOUS
  Administered 2016-07-18: 50 ug via INTRAVENOUS
  Administered 2016-07-18: 100 ug via INTRAVENOUS
  Administered 2016-07-18: 50 ug via INTRAVENOUS
  Administered 2016-07-18 (×3): 100 ug via INTRAVENOUS

## 2016-07-18 MED ORDER — FAMOTIDINE 20 MG PO TABS
20.0000 mg | ORAL_TABLET | Freq: Once | ORAL | Status: AC
  Administered 2016-07-18: 20 mg via ORAL

## 2016-07-18 MED ORDER — MIDAZOLAM HCL 2 MG/2ML IJ SOLN
INTRAMUSCULAR | Status: DC | PRN
  Administered 2016-07-18: 2 mg via INTRAVENOUS

## 2016-07-18 MED ORDER — FENTANYL CITRATE (PF) 100 MCG/2ML IJ SOLN
INTRAMUSCULAR | Status: DC | PRN
  Administered 2016-07-18 (×4): 25 ug via INTRAVENOUS

## 2016-07-18 MED ORDER — PROPOFOL 10 MG/ML IV BOLUS
INTRAVENOUS | Status: DC | PRN
  Administered 2016-07-18: 150 mg via INTRAVENOUS

## 2016-07-18 MED ORDER — FENTANYL CITRATE (PF) 100 MCG/2ML IJ SOLN
INTRAMUSCULAR | Status: AC
  Administered 2016-07-18: 25 ug via INTRAVENOUS
  Filled 2016-07-18: qty 2

## 2016-07-18 MED ORDER — BUPIVACAINE-EPINEPHRINE (PF) 0.25% -1:200000 IJ SOLN
INTRAMUSCULAR | Status: AC
  Filled 2016-07-18: qty 30

## 2016-07-18 MED ORDER — OXYCODONE HCL 5 MG/5ML PO SOLN: 5.0000 mg | Freq: Once | ORAL | Status: AC | PRN

## 2016-07-18 MED ORDER — MORPHINE SULFATE (PF) 4 MG/ML IV SOLN
INTRAVENOUS | Status: AC
  Filled 2016-07-18: qty 1

## 2016-07-18 MED ORDER — OXYCODONE HCL 5 MG PO TABS
5.0000 mg | ORAL_TABLET | Freq: Once | ORAL | Status: AC | PRN
  Administered 2016-07-18: 5 mg via ORAL

## 2016-07-18 MED ORDER — HYDROCODONE-ACETAMINOPHEN 5-325 MG PO TABS: 1.0000 | ORAL_TABLET | ORAL | 0 refills | Status: DC | PRN

## 2016-07-18 MED ORDER — BISOPROLOL FUMARATE 5 MG PO TABS
2.5000 mg | ORAL_TABLET | Freq: Once | ORAL | Status: AC
  Administered 2016-07-18: 2.5 mg via ORAL
  Filled 2016-07-18: qty 0.5

## 2016-07-18 MED ORDER — OXYCODONE HCL 5 MG PO TABS
ORAL_TABLET | ORAL | Status: AC
  Administered 2016-07-18: 5 mg via ORAL
  Filled 2016-07-18: qty 1

## 2016-07-18 MED ORDER — ONDANSETRON HCL 4 MG/2ML IJ SOLN
INTRAMUSCULAR | Status: DC | PRN
  Administered 2016-07-18: 4 mg via INTRAVENOUS

## 2016-07-18 MED ORDER — LACTATED RINGERS IV SOLN
INTRAVENOUS | Status: DC | PRN
  Administered 2016-07-18: 12:00:00 via INTRAVENOUS

## 2016-07-18 MED ORDER — ACETAMINOPHEN 10 MG/ML IV SOLN
INTRAVENOUS | Status: DC | PRN
  Administered 2016-07-18: 1000 mg via INTRAVENOUS

## 2016-07-18 MED ORDER — ACETAMINOPHEN 10 MG/ML IV SOLN
INTRAVENOUS | Status: AC
  Filled 2016-07-18: qty 100

## 2016-07-18 MED ORDER — BUPIVACAINE-EPINEPHRINE 0.25% -1:200000 IJ SOLN
INTRAMUSCULAR | Status: DC | PRN
  Administered 2016-07-18: 5 mL
  Administered 2016-07-18: 25 mL

## 2016-07-18 MED ORDER — CHLORHEXIDINE GLUCONATE 4 % EX LIQD
60.0000 mL | Freq: Once | CUTANEOUS | Status: AC
Start: 2016-07-18 — End: 2016-07-18
  Administered 2016-07-18: 4 via TOPICAL

## 2016-07-18 MED ORDER — FENTANYL CITRATE (PF) 100 MCG/2ML IJ SOLN
25.0000 ug | INTRAMUSCULAR | Status: AC | PRN
  Administered 2016-07-18 (×6): 25 ug via INTRAVENOUS

## 2016-07-18 MED ORDER — SODIUM CHLORIDE 0.9 % IV SOLN
INTRAVENOUS | Status: DC
  Administered 2016-07-18: 1000 mL via INTRAVENOUS

## 2016-07-18 MED ORDER — FAMOTIDINE 20 MG PO TABS
ORAL_TABLET | ORAL | Status: AC
  Filled 2016-07-18: qty 1

## 2016-07-18 MED ORDER — MORPHINE SULFATE (PF) 4 MG/ML IV SOLN
INTRAVENOUS | Status: DC | PRN
  Administered 2016-07-18: 4 mg

## 2016-07-18 MED ORDER — LACTATED RINGERS IR SOLN
Status: DC | PRN
  Administered 2016-07-18: 15500 mL

## 2016-07-18 MED ORDER — GLYCOPYRROLATE 0.2 MG/ML IJ SOLN
INTRAMUSCULAR | Status: DC | PRN
  Administered 2016-07-18: 0.2 mg via INTRAVENOUS

## 2016-07-18 SURGICAL SUPPLY — 29 items
ADAPTER IRRIG TUBE 2 SPIKE SOL (ADAPTER) ×6 IMPLANT
BLADE SHAVER 4.5 DBL SERAT CV (CUTTER) ×3 IMPLANT
BNDG ESMARK 6X12 TAN STRL LF (GAUZE/BANDAGES/DRESSINGS) ×3 IMPLANT
CUFF TOURN 24 STER (MISCELLANEOUS) ×3 IMPLANT
CUFF TOURN 30 STER DUAL PORT (MISCELLANEOUS) ×6 IMPLANT
DRSG DERMACEA 8X12 NADH (GAUZE/BANDAGES/DRESSINGS) ×3 IMPLANT
DURAPREP 26ML APPLICATOR (WOUND CARE) ×6 IMPLANT
GAUZE SPONGE 4X4 12PLY STRL (GAUZE/BANDAGES/DRESSINGS) ×3 IMPLANT
GLOVE BIO SURGEON STRL SZ 6.5 (GLOVE) ×2 IMPLANT
GLOVE BIO SURGEON STRL SZ7 (GLOVE) ×3 IMPLANT
GLOVE BIO SURGEONS STRL SZ 6.5 (GLOVE) ×1
GLOVE BIOGEL M STRL SZ7.5 (GLOVE) ×3 IMPLANT
GLOVE BIOGEL PI IND STRL 7.5 (GLOVE) ×1 IMPLANT
GLOVE BIOGEL PI INDICATOR 7.5 (GLOVE) ×2
GLOVE INDICATOR 8.0 STRL GRN (GLOVE) ×3 IMPLANT
GOWN STRL REUS W/ TWL LRG LVL3 (GOWN DISPOSABLE) ×2 IMPLANT
GOWN STRL REUS W/TWL LRG LVL3 (GOWN DISPOSABLE) ×4
IV LACTATED RINGER IRRG 3000ML (IV SOLUTION) ×12
IV LR IRRIG 3000ML ARTHROMATIC (IV SOLUTION) ×6 IMPLANT
KIT RM TURNOVER STRD PROC AR (KITS) ×3 IMPLANT
MANIFOLD NEPTUNE II (INSTRUMENTS) ×3 IMPLANT
PACK ARTHROSCOPY KNEE (MISCELLANEOUS) ×3 IMPLANT
SET TUBE SUCT SHAVER OUTFL 24K (TUBING) ×3 IMPLANT
SET TUBE TIP INTRA-ARTICULAR (MISCELLANEOUS) ×3 IMPLANT
SUT ETHILON 3-0 FS-10 30 BLK (SUTURE) ×3
SUTURE EHLN 3-0 FS-10 30 BLK (SUTURE) ×1 IMPLANT
TUBING ARTHRO INFLOW-ONLY STRL (TUBING) ×3 IMPLANT
WAND HAND CNTRL MULTIVAC 50 (MISCELLANEOUS) ×3 IMPLANT
WRAP KNEE W/COLD PACKS 25.5X14 (SOFTGOODS) ×3 IMPLANT

## 2016-07-18 NOTE — Anesthesia Postprocedure Evaluation (Signed)
Anesthesia Post Note  Patient: Kim Thomas  Procedure(s) Performed: Procedure(s) (LRB): ARTHROSCOPY KNEE (Left) KNEE ARTHROSCOPY WITH PARTIAL  LATERAL MENISECTOMY, CHONDROPLASTY LATERAL FEMORAL CONDYLE (Left)  Patient location during evaluation: PACU Anesthesia Type: General Level of consciousness: awake and alert Pain management: pain level controlled Vital Signs Assessment: post-procedure vital signs reviewed and stable Respiratory status: spontaneous breathing, nonlabored ventilation, respiratory function stable and patient connected to nasal cannula oxygen Cardiovascular status: blood pressure returned to baseline and stable Postop Assessment: no signs of nausea or vomiting Anesthetic complications: no    Last Vitals:  Vitals:   07/18/16 1433 07/18/16 1516  BP: 121/67 (!) 112/59  Pulse: 64 68  Resp: 16 16  Temp: (!) 36 C     Last Pain:  Vitals:   07/18/16 1516  TempSrc:   PainSc: 0-No pain                 Cleda MccreedyJoseph K Piscitello

## 2016-07-18 NOTE — Brief Op Note (Addendum)
07/18/2016  1:22 PM  PATIENT:  Kim Thomas  60 y.o. 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  PROCEDURE:  Procedure(s): ARTHROSCOPY KNEE (Left) KNEE ARTHROSCOPY WITH PARTIAL  LATERAL MENISECTOMY, CHONDROPLASTY LATERAL FEMORAL CONDYLE (Left)  SURGEON:  Surgeon(s) and Role:    * Donato HeinzJames P Kerston Landeck, MD - Primary  ASSISTANTS: none   ANESTHESIA:   general  EBL:  Minimal  BLOOD ADMINISTERED:none  DRAINS: none   LOCAL MEDICATIONS USED:  MARCAINE     SPECIMEN:  No Specimen  DISPOSITION OF SPECIMEN:  N/A  COUNTS:  YES  TOURNIQUET:   not used  DICTATION: .Office managerDragon Dictation  PLAN OF CARE: Discharge to home after PACU  PATIENT DISPOSITION:  PACU - hemodynamically stable.   Delay start of Pharmacological VTE agent (>24hrs) due to surgical blood loss or risk of bleeding: not applicable

## 2016-07-18 NOTE — Op Note (Signed)
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   ASSISTANT: none  ANESTHESIA: general  ESTIMATED BLOOD LOSS: Minimal  FLUIDS REPLACED: 1100 mL of crystalloid  TOURNIQUET TIME: Not used   DRAINS: none  IMPLANTS UTILIZED: None  INDICATIONS FOR SURGERY: Kim Thomas is a 60 y.o. year old female who has been seen for complaints of left knee pain. MRI demonstrated findings consistent with meniscal pathology. After discussion of the risks and benefits of surgical intervention, the patient expressed understanding of the risks benefits and agree with plans for left knee arthroscopy.   PROCEDURE IN DETAIL: The patient was brought into the operating room and, after adequate general anesthesia was achieved, a tourniquet was applied to the left thigh and the leg was placed in the leg holder. All bony prominences were well padded. The patient's left knee was cleaned and prepped with alcohol and Duraprep and draped in the usual sterile fashion. A "timeout" was performed as per usual protocol. The anticipated portal sites were injected with 0.25% Marcaine with epinephrine. An anterolateral incision was made and a cannula was inserted. A large effusion was evacuated and the knee was distended with fluid using the pump. The scope was advanced down the medial gutter into the medial compartment. Under visualization with the scope, an anteromedial portal was created and a hooked probe was inserted. The medial meniscus was visualized and probed. The medial  meniscus was stable without evidence of tear or instability. The articular cartilage was visualized and was noted to be in good condition.  The scope was then advanced into the intercondylar notch. The anterior cruciate ligament was visualized and probed and felt to be intact. The scope was removed from the lateral portal and reinserted via the anteromedial portal to better visualize the lateral compartment. The lateral meniscus was visualized and probed. There was a complex tear involving a portion of the posterior horn extending to the anterior horn of the lateral meniscus. The tear was debrided using combination of meniscal punches and a 4.5 mm incisor shaver. Contouring was performed using the 50 ArthroCare wand. The remaining rim of meniscus was probed and felt to be stable. The articular cartilage of the lateral compartment was visualized. Grade 3 chondromalacia was noted to the lateral femoral condyle. The area was debrided and contoured using the 50 ArthroCare wand. Finally, the scope was advanced so as to visualize the patellofemoral articulation. Good patellar tracking was appreciated. There was some early grade 3 chondromalacia involving the intercondylar sulcus. The area was debrided and contoured using the ArthroCare wand.  The knee was irrigated with copius amounts of fluid and suctioned dry. The anterolateral portal was re-approximated with #3-0 nylon. A combination of 0.25% Marcaine with epinephrine and 4 mg of Morphine were injected via the scope. The scope was removed and the anteromedial portal was re-approximated with #3-0 nylon. A sterile dressing was applied followed by application of an ice wrap.  The patient tolerated the procedure well and was transported to the PACU in stable condition.  Kim Thomas, Jr., M.D.

## 2016-07-18 NOTE — Progress Notes (Signed)
Doesn't remember where she is or what day it is   Very confused   Talked to dr hooten   States she was oriented before surgery  Thinks confusion is from medication

## 2016-07-18 NOTE — Transfer of Care (Signed)
Immediate Anesthesia Transfer of Care Note  Patient: [name]  Procedure(s) Performed: Procedure(s): ARTHROSCOPY KNEE (Left) KNEE ARTHROSCOPY WITH PARTIAL  LATERAL MENISECTOMY, CHONDROPLASTY LATERAL FEMORAL CONDYLE (Left)  Patient Location: PACU  Anesthesia Type:General  Level of Consciousness: awake, alert  and oriented  Airway & Oxygen Therapy: Patient Spontanous Breathing  Post-op Assessment: Report given to RN and Post -op Vital signs reviewed and stable  Post vital signs: Reviewed and stable  Last Vitals:  Vitals:   07/18/16 1004  BP: 126/70  Pulse: 80  Resp: 16  Temp: (!) 35.5 C    Last Pain:  Vitals:   07/18/16 1004  TempSrc: Tympanic  PainSc: 6          Complications: No apparent anesthesia complications

## 2016-07-18 NOTE — Discharge Instructions (Signed)
AMBULATORY SURGERY  °DISCHARGE INSTRUCTIONS ° ° °1) The drugs that you were given will stay in your system until tomorrow so for the next 24 hours you should not: ° °A) Drive an automobile °B) Make any legal decisions °C) Drink any alcoholic beverage ° ° °2) You may resume regular meals tomorrow.  Today it is better to start with liquids and gradually work up to solid foods. ° °You may eat anything you prefer, but it is better to start with liquids, then soup and crackers, and gradually work up to solid foods. ° ° °3) Please notify your doctor immediately if you have any unusual bleeding, trouble breathing, redness and pain at the surgery site, drainage, fever, or pain not relieved by medication. ° ° ° °4) Additional Instructions: ° ° ° ° ° ° ° °Please contact your physician with any problems or Same Day Surgery at [phone], Monday through Friday 6 am to 4 pm, or [place] [facility] [place] [facility] number at [phone]. ° ° °Instructions after Knee Arthroscopy  ° ° James P. Hooten, Jr., M.D.    ° Dept. of Orthopaedics & Sports Medicine ° [facility] ° [address] ° [place], [place]  [address] ° ° Phone: [phone]   Fax: [phone] ° ° °DIET: °• Drink plenty of non-alcoholic fluids & begin a light diet. °• Resume your normal diet the day after surgery. ° °ACTIVITY:  °• You may use crutches or a walker with weight-bearing as tolerated, unless instructed otherwise. °• You may wean yourself off of the walker or crutches as tolerated.  °• Begin doing gentle exercises. Exercising will reduce the pain and swelling, increase motion, and prevent muscle weakness.   °• Avoid strenuous activities or athletics for a minimum of 4-6 weeks after arthroscopic surgery. °• Do not drive or operate any equipment until instructed. ° °WOUND CARE:  °• Place one to two pillows under the knee the first day or two when sitting or lying.  °• Continue to use the ice packs periodically to reduce pain and swelling. °• The small  incisions in your knee are closed with nylon stitches. The stitches will be removed in the office. °• The bulky dressing may be removed on the second day after surgery. DO NOT TOUCH THE STITCHES. Put a Band-Aid over each stitch. Do NOT use any ointments or creams on the incisions.  °• You may bathe or shower after the stitches are removed at the first office visit following surgery. ° °MEDICATIONS: °• You may resume your regular medications. °• Please take the pain medication as prescribed. °• Do not take pain medication on an empty stomach. °• Do not drive or drink alcoholic beverages when taking pain medications. ° °CALL THE OFFICE FOR: °• Temperature above 101 degrees °• Excessive bleeding or drainage on the dressing. °• Excessive swelling, coldness, or paleness of the toes. °• Persistent nausea and vomiting. ° °FOLLOW-UP:  °• You should have an appointment to return to the office in 7-10 days after surgery.  °  °

## 2016-07-18 NOTE — Progress Notes (Signed)
Dr piscitello in to see pt  Pt remains confused but in pain   After 15omcg fentanyl    After crackers and drink pt received oxycodone 5mg  for pain level of 6 out of 10

## 2016-07-18 NOTE — H&P (Signed)
The patient has been re-examined, and the chart reviewed, and there have been no interval changes to the documented history and physical.    The risks, benefits, and alternatives have been discussed at length. The patient expressed understanding of the risks benefits and agreed with plans for surgical intervention.  James P. Hooten, Jr. M.D.    

## 2016-07-18 NOTE — Anesthesia Procedure Notes (Signed)
Procedure Name: LMA Insertion Date/Time: 07/18/2016 11:51 AM Performed by: Edyth GunnelsGILBERT, Alexandar Weisenberger Pre-anesthesia Checklist: Patient identified, Emergency Drugs available, Suction available, Patient being monitored and Timeout performed Patient Re-evaluated:Patient Re-evaluated prior to inductionOxygen Delivery Method: Circle system utilized Preoxygenation: Pre-oxygenation with 100% oxygen Intubation Type: IV induction Ventilation: Mask ventilation without difficulty LMA: LMA inserted LMA Size: 3.5 Tube secured with: Tape Dental Injury: Teeth and Oropharynx as per pre-operative assessment

## 2016-07-18 NOTE — Progress Notes (Signed)
Elevated leg on pillows  And ice pack

## 2016-07-18 NOTE — Anesthesia Preprocedure Evaluation (Signed)
Anesthesia Evaluation  Patient identified by MRN, date of birth, ID band Patient awake    Reviewed: Allergy & Precautions, H&P , NPO status , Patient's Chart, lab work & pertinent test results  History of Anesthesia Complications Negative for: history of anesthetic complications  Airway Mallampati: III  TM Distance: >3 FB Neck ROM: limited    Dental no notable dental hx. (+) Poor Dentition, Chipped, Missing, Partial Lower, Upper Dentures   Pulmonary neg shortness of breath, sleep apnea and Continuous Positive Airway Pressure Ventilation , former smoker,    Pulmonary exam normal breath sounds clear to auscultation       Cardiovascular Exercise Tolerance: Good hypertension, (-) angina(-) Past MI and (-) DOE Normal cardiovascular exam Rhythm:regular Rate:Normal     Neuro/Psych PSYCHIATRIC DISORDERS Anxiety Depression negative neurological ROS     GI/Hepatic negative GI ROS, Neg liver ROS,   Endo/Other  diabetes, Type 2  Renal/GU CRFRenal disease     Musculoskeletal  (+) Arthritis ,   Abdominal   Peds  Hematology negative hematology ROS (+)   Anesthesia Other Findings Past Medical History: No date: Anxiety No date: Chronic kidney disease No date: DDD (degenerative disc disease), cervical No date: Diabetes mellitus without complication (HCC) No date: Gout No date: Hypertension No date: Sleep apnea  Past Surgical History: No date: BREAST CYST EXCISION Left No date: c section     Comment: x2 No date: CHOLECYSTECTOMY No date: FOOT SURGERY Bilateral 11/09/11: SPINE SURGERY     Comment: C5 No date: TUBAL LIGATION  BMI    Body Mass Index:  33.99 kg/m      Reproductive/Obstetrics negative OB ROS                             Anesthesia Physical Anesthesia Plan  ASA: III  Anesthesia Plan: General LMA   Post-op Pain Management:    Induction:   Airway Management Planned:    Additional Equipment:   Intra-op Plan:   Post-operative Plan:   Informed Consent: I have reviewed the patients History and Physical, chart, labs and discussed the procedure including the risks, benefits and alternatives for the proposed anesthesia with the patient or authorized representative who has indicated his/her understanding and acceptance.     Plan Discussed with: Anesthesiologist, CRNA and Surgeon  Anesthesia Plan Comments:         Anesthesia Quick Evaluation

## 2016-07-19 ENCOUNTER — Encounter: Payer: Self-pay | Admitting: Orthopedic Surgery

## 2016-07-19 ENCOUNTER — Ambulatory Visit: Payer: Medicare Other | Admitting: Unknown Physician Specialty

## 2016-07-22 ENCOUNTER — Encounter: Payer: Self-pay | Admitting: Unknown Physician Specialty

## 2016-07-22 ENCOUNTER — Ambulatory Visit (INDEPENDENT_AMBULATORY_CARE_PROVIDER_SITE_OTHER): Payer: Medicare Other | Admitting: Unknown Physician Specialty

## 2016-07-22 VITALS — BP 123/70 | HR 94 | Temp 98.4°F | Wt 200.8 lb

## 2016-07-22 DIAGNOSIS — I10 Essential (primary) hypertension: Secondary | ICD-10-CM | POA: Diagnosis not present

## 2016-07-22 DIAGNOSIS — E119 Type 2 diabetes mellitus without complications: Secondary | ICD-10-CM

## 2016-07-22 LAB — LIPID PANEL PICCOLO, WAIVED
Chol/HDL Ratio Piccolo,Waive: 2.2 mg/dL
Cholesterol Piccolo, Waived: 153 mg/dL (ref <200)
HDL Chol Piccolo, Waived: 68 mg/dL (ref >59)
LDL Chol Calc Piccolo Waived: 68 mg/dL (ref <100)
Triglycerides Piccolo,Waived: 87 mg/dL (ref <150)
VLDL Chol Calc Piccolo,Waive: 17 mg/dL (ref <30)

## 2016-07-22 LAB — BAYER DCA HB A1C WAIVED: HB A1C (BAYER DCA - WAIVED): 6.2 % (ref <7.0)

## 2016-07-22 NOTE — Assessment & Plan Note (Signed)
Stable, continue present medications.   

## 2016-07-22 NOTE — Progress Notes (Signed)
BP 123/70 (BP Location: Left Arm, Patient Position: Sitting, Cuff Size: Large)   Pulse 94   Temp 98.4 F (36.9 C)   Wt 200 lb 12.8 oz (91.1 kg) Comment: pt has knee brace on  LMP  (LMP Unknown)   SpO2 97%   BMI 34.47 kg/m    Subjective:    Patient ID: Kim Thomas, female    DOB: 1956/01/16, 60 y.o.   MRN: [id number]  HPI: Kim Aquasngel Ambrose is a 60 y.o. female  Chief Complaint  Patient presents with  . Diabetes   Diabetes: Using medications without difficulties No hypoglycemic episodes No hyperglycemic episodes Feet problems:none Blood Sugars averaging: 130 off Metformin when she was off of it due to surgery.  She is back on it eye exam within last year Last Hgb A1C: 7.2  Hypertension  Using medications without difficulty Average home BPs Not checking  Using medication without problems or lightheadedness No chest pain with exertion or shortness of breath No Edema  Elevated Cholesterol Using medications without problems No Muscle aches  Diet: watches her diet Exercise: just had surgery    Relevant past medical, surgical, family and social history reviewed and updated as indicated. Interim medical history since our last visit reviewed. Allergies and medications reviewed and updated.  Review of Systems  Per HPI unless specifically indicated above     Objective:    BP 123/70 (BP Location: Left Arm, Patient Position: Sitting, Cuff Size: Large)   Pulse 94   Temp 98.4 F (36.9 C)   Wt 200 lb 12.8 oz (91.1 kg) Comment: pt has knee brace on  LMP  (LMP Unknown)   SpO2 97%   BMI 34.47 kg/m   Wt Readings from Last 3 Encounters:  07/22/16 200 lb 12.8 oz (91.1 kg)  07/18/16 198 lb (89.8 kg)  07/15/16 199 lb (90.3 kg)    Physical Exam  Constitutional: She is oriented to person, place, and time. She appears well-developed and well-nourished. No distress.  HENT:  Head: Normocephalic and atraumatic.  Eyes: Conjunctivae and lids are normal. Right eye exhibits no  discharge. Left eye exhibits no discharge. No scleral icterus.  Neck: Normal range of motion. Neck supple. No JVD present. Carotid bruit is not present.  Cardiovascular: Normal rate, regular rhythm and normal heart sounds.   Pulmonary/Chest: Effort normal and breath sounds normal.  Abdominal: Normal appearance. There is no splenomegaly or hepatomegaly.  Musculoskeletal: Normal range of motion.  Neurological: She is alert and oriented to person, place, and time.  Skin: Skin is warm, dry and intact. No rash noted. No pallor.  Psychiatric: She has a normal mood and affect. Her behavior is normal. Judgment and thought content normal.    Results for orders placed or performed during the hospital encounter of 07/18/16  Glucose, capillary  Result Value Ref Range   Glucose-Capillary 131 (H) 65 - 99 mg/dL  Glucose, capillary  Result Value Ref Range   Glucose-Capillary 80 65 - 99 mg/dL  I-STAT 4, (NA,K, GLUC, HGB,HCT)  Result Value Ref Range   Sodium 143 135 - 145 mmol/L   Potassium 4.0 3.5 - 5.1 mmol/L   Glucose, Bld 117 (H) 65 - 99 mg/dL   HCT 82.939.0 56.236.0 - 13.046.0 %   Hemoglobin 13.3 12.0 - 15.0 g/dL      Assessment & Plan:   Problem List Items Addressed This Visit      Unprioritized   Diabetes (HCC) - Primary    Stable, continue present medications.  Relevant Orders   Lipid Panel Piccolo, Waived   Bayer DCA Hb A1c Waived   Comprehensive metabolic panel   Hypertension    Stable, continue present medications.        Relevant Orders   Lipid Panel Piccolo, Waived   Bayer DCA Hb A1c Waived   Comprehensive metabolic panel    Other Visit Diagnoses   None.      Follow up plan: Return in about 6 months (around 01/22/2017).

## 2016-07-23 LAB — COMPREHENSIVE METABOLIC PANEL
ALT: 25 IU/L (ref 0–32)
AST: 27 IU/L (ref 0–40)
Albumin/Globulin Ratio: 1.1 — ABNORMAL LOW (ref 1.2–2.2)
Albumin: 3.6 g/dL (ref 3.6–4.8)
Alkaline Phosphatase: 71 IU/L (ref 39–117)
BUN/Creatinine Ratio: 10 — ABNORMAL LOW (ref 12–28)
BUN: 11 mg/dL (ref 8–27)
Bilirubin Total: 0.2 mg/dL (ref 0.0–1.2)
CO2: 24 mmol/L (ref 18–29)
Calcium: 9.4 mg/dL (ref 8.7–10.3)
Chloride: 106 mmol/L (ref 96–106)
Creatinine, Ser: 1.05 mg/dL — ABNORMAL HIGH (ref 0.57–1.00)
GFR calc Af Amer: 67 mL/min/{1.73_m2} (ref >59)
GFR calc non Af Amer: 58 mL/min/{1.73_m2} — ABNORMAL LOW (ref >59)
Globulin, Total: 3.2 g/dL (ref 1.5–4.5)
Glucose: 114 mg/dL — ABNORMAL HIGH (ref 65–99)
Potassium: 4.4 mmol/L (ref 3.5–5.2)
Sodium: 145 mmol/L — ABNORMAL HIGH (ref 134–144)
Total Protein: 6.8 g/dL (ref 6.0–8.5)

## 2016-07-25 ENCOUNTER — Encounter: Payer: Self-pay | Admitting: Unknown Physician Specialty

## 2016-08-05 ENCOUNTER — Other Ambulatory Visit: Payer: Self-pay | Admitting: Neurology

## 2016-08-05 DIAGNOSIS — R202 Paresthesia of skin: Principal | ICD-10-CM

## 2016-08-05 DIAGNOSIS — R200 Anesthesia of skin: Secondary | ICD-10-CM

## 2016-08-12 ENCOUNTER — Ambulatory Visit
Admission: RE | Admit: 2016-08-12 | Discharge: 2016-08-12 | Disposition: A | Discharge status: Home / Self Care | Payer: Medicare Other | Source: Ambulatory Visit | Attending: Neurology | Admitting: Neurology

## 2016-08-12 DIAGNOSIS — R200 Anesthesia of skin: Secondary | ICD-10-CM

## 2016-08-12 DIAGNOSIS — R202 Paresthesia of skin: Principal | ICD-10-CM

## 2016-10-27 ENCOUNTER — Other Ambulatory Visit: Payer: Self-pay | Admitting: Unknown Physician Specialty

## 2016-11-03 ENCOUNTER — Other Ambulatory Visit: Payer: Self-pay | Admitting: Unknown Physician Specialty

## 2017-01-17 ENCOUNTER — Ambulatory Visit: Payer: Medicare Other | Admitting: Physical Therapy

## 2017-01-19 ENCOUNTER — Encounter: Payer: Medicare Other | Admitting: Physical Therapy

## 2017-01-23 ENCOUNTER — Ambulatory Visit: Payer: Medicare Other | Admitting: Unknown Physician Specialty

## 2017-01-24 ENCOUNTER — Encounter: Payer: Medicare Other | Admitting: Physical Therapy

## 2017-02-16 ENCOUNTER — Ambulatory Visit: Payer: Medicare Other | Attending: Nurse Practitioner | Admitting: Physical Therapy

## 2017-02-16 DIAGNOSIS — M546 Pain in thoracic spine: Secondary | ICD-10-CM | POA: Diagnosis present

## 2017-02-16 DIAGNOSIS — G8929 Other chronic pain: Secondary | ICD-10-CM | POA: Diagnosis present

## 2017-02-16 DIAGNOSIS — M545 Low back pain: Secondary | ICD-10-CM | POA: Diagnosis present

## 2017-02-16 DIAGNOSIS — M6281 Muscle weakness (generalized): Secondary | ICD-10-CM | POA: Diagnosis present

## 2017-02-16 NOTE — Patient Instructions (Addendum)
Standing lumbar rotations - not painful   Standing lumbar flexion (limited to just below knees) -- painful on return reproducing her symptoms  Standing lumbar extension  -- no pain appropriate ROM  Hip flexor MMT --4/5 bilaterally   Knee extensor MMT -- mild weakness but not overtly painful  HS MMT - WNL bilaterally  Slump test - mild pain behind R knee on R test, otherwise negative tests.   SLR - mild pain at end range bilaterally   Bridge test - felt initially in lumbar spine, able to feel in thighs with cuing for pushing through her heels.   Hip IR  R - good ROM, no pain L - good ROM, no pain  Hip ER R - good ROM, no pain L - good ROM, no pain  Passive hip flexion -- normal on L, reproductive of pain on R   HS testing in 90 - 90  R - WNL (felt some reproduction of her pain) L - WNL  Ely's position -  R - to roughly 120 degrees (just stretching sensation in front of thigh).  L - quite limited in ROM, not painful.(maybe 90 degrees roughly)  Palpation - pain reported more biased to R side around L4/L5.

## 2017-02-20 ENCOUNTER — Ambulatory Visit (INDEPENDENT_AMBULATORY_CARE_PROVIDER_SITE_OTHER): Payer: Medicare Other | Admitting: Unknown Physician Specialty

## 2017-02-20 ENCOUNTER — Encounter: Payer: Self-pay | Admitting: Unknown Physician Specialty

## 2017-02-20 VITALS — BP 116/72 | HR 79 | Temp 98.8°F | Ht 65.5 in | Wt 195.8 lb

## 2017-02-20 DIAGNOSIS — Z9181 History of falling: Secondary | ICD-10-CM

## 2017-02-20 DIAGNOSIS — M545 Low back pain: Secondary | ICD-10-CM | POA: Diagnosis not present

## 2017-02-20 DIAGNOSIS — Z124 Encounter for screening for malignant neoplasm of cervix: Secondary | ICD-10-CM | POA: Diagnosis not present

## 2017-02-20 DIAGNOSIS — G8929 Other chronic pain: Secondary | ICD-10-CM | POA: Diagnosis not present

## 2017-02-20 DIAGNOSIS — Z7189 Other specified counseling: Secondary | ICD-10-CM

## 2017-02-20 DIAGNOSIS — R52 Pain, unspecified: Secondary | ICD-10-CM | POA: Insufficient documentation

## 2017-02-20 DIAGNOSIS — M25511 Pain in right shoulder: Secondary | ICD-10-CM | POA: Diagnosis not present

## 2017-02-20 DIAGNOSIS — L0293 Carbuncle, unspecified: Secondary | ICD-10-CM

## 2017-02-20 DIAGNOSIS — I10 Essential (primary) hypertension: Secondary | ICD-10-CM | POA: Diagnosis not present

## 2017-02-20 DIAGNOSIS — Z0000 Encounter for general adult medical examination without abnormal findings: Secondary | ICD-10-CM | POA: Diagnosis not present

## 2017-02-20 DIAGNOSIS — Z1211 Encounter for screening for malignant neoplasm of colon: Secondary | ICD-10-CM | POA: Insufficient documentation

## 2017-02-20 DIAGNOSIS — E119 Type 2 diabetes mellitus without complications: Secondary | ICD-10-CM

## 2017-02-20 LAB — MICROALBUMIN, URINE WAIVED
Creatinine, Urine Waived: 50 mg/dL (ref 10–300)
Microalb, Ur Waived: 10 mg/L (ref 0–19)

## 2017-02-20 MED ORDER — ALLOPURINOL 100 MG PO TABS: 100.0000 mg | ORAL_TABLET | Freq: Every day | ORAL | 3 refills | Status: DC

## 2017-02-20 MED ORDER — BISOPROLOL-HYDROCHLOROTHIAZIDE 2.5-6.25 MG PO TABS: 1.0000 | ORAL_TABLET | Freq: Every day | ORAL | 3 refills | Status: DC

## 2017-02-20 MED ORDER — METFORMIN HCL 500 MG PO TABS: 500.0000 mg | ORAL_TABLET | Freq: Three times a day (TID) | ORAL | 1 refills | Status: DC

## 2017-02-20 MED ORDER — DULOXETINE HCL 20 MG PO CPEP: 20.0000 mg | ORAL_CAPSULE | Freq: Every day | ORAL | 1 refills | Status: DC

## 2017-02-20 NOTE — Assessment & Plan Note (Addendum)
She states she has fallen 4 times since the last visit.  However, this has been in the bathroom and [facility]  Check monthly for continued managment

## 2017-02-20 NOTE — Therapy (Signed)
[facility] PHYSICAL AND SPORTS MEDICINE [address], [place], [address] Phone: [phone]   Fax:  [phone]  Physical Therapy Evaluation  Patient Details  Name: Kim Thomas MRN: [id number] Date of Birth: June 10, 1956 No Data Recorded  Encounter Date: 02/16/2017      PT End of Session - 02/20/17 1251    Visit Number 1   Number of Visits 13   Date for PT Re-Evaluation 04/13/17   PT Start Time 1449   PT Stop Time 1545   PT Time Calculation (min) 56 min   Activity Tolerance Patient tolerated treatment well   Behavior During Therapy [facility] for tasks assessed/performed      Past Medical History:  Diagnosis Date  . Anxiety   . Chronic kidney disease   . DDD (degenerative disc disease), cervical   . Diabetes mellitus without complication (HCC)   . Gout   . Hypertension   . Sleep apnea     Past Surgical History:  Procedure Laterality Date  . BREAST CYST EXCISION Left   . c section     x2  . CHOLECYSTECTOMY    . FOOT SURGERY Bilateral   . KNEE ARTHROSCOPY Left 07/18/2016   Procedure: ARTHROSCOPY KNEE;  Surgeon: Donato Heinz, MD;  Location: [facility] ORS;  Service: Orthopedics;  Laterality: Left;  . KNEE ARTHROSCOPY WITH LATERAL MENISECTOMY Left 07/18/2016   Procedure: KNEE ARTHROSCOPY WITH PARTIAL  LATERAL MENISECTOMY, CHONDROPLASTY LATERAL FEMORAL CONDYLE;  Surgeon: Donato Heinz, MD;  Location: [facility] OR[facility];  Service: Orthopedics;  Laterality: Left;  . SPINE SURGERY  11/09/11   C5  . TUBAL LIGATION      There were no vitals filed for this visit.       Subjective Assessment - 02/20/17 1251    Subjective Patient reports she has been having lower back pain for roughly 5 years. This began after an injury at work, which she reports ended with her having cervical surgery. She reports episodes where her back "goes out on her" and she has a difficult time standing up straight. She has had several falls in her bathroom, the last one with  her falling out of the tub while exiting the shower. She reports a sensation of "compression" on her spine while sitting, as well as pain/burning from her R scapula to her lateral R arm every morning. She has had "trigger point injections" for ~ 2 years, helped but required quite a few.    Pertinent History ACDF C6-C7   Limitations Sitting;Lifting;Standing;Walking   How long can you sit comfortably? Depending on the surface, she reports it can be immediate pain or take several minutes.    Diagnostic tests MRI in 07/2016 indicative of mild degenerative changes in lumbar spine.    Patient Stated Goals To have less pain    Currently in Pain? Yes   Pain Score 4    Pain Location Back   Pain Orientation Lower;Mid   Pain Descriptors / Indicators Aching   Pain Type Chronic pain   Pain Onset More than a month ago   Pain Frequency Intermittent   Aggravating Factors  Prolonged time sitting or standing.    Pain Relieving Factors Reports being in a recliner helps.       Standing lumbar rotations - not painful   Standing lumbar flexion (limited to just below knees) -- painful on return reproducing her symptoms  Standing lumbar extension  -- no pain appropriate ROM  Hip flexor MMT --4/5 bilaterally  Knee extensor MMT -- mild weakness but not overtly painful  HS MMT - WNL bilaterally  Slump test - mild pain behind R knee on R test, otherwise negative tests.   SLR - mild pain at end range bilaterally   Bridge test - felt initially in lumbar spine, able to feel in thighs with cuing for pushing through her heels.   Hip IR  R - good ROM, no pain L - good ROM, no pain  Hip ER R - good ROM, no pain L - good ROM, no pain  Passive hip flexion -- normal on L, reproductive of pain on R   HS testing in 90 - 90  R - WNL (felt some reproduction of her pain) L - WNL  Ely's position -  R - to roughly 120 degrees (just stretching sensation in front of thigh).  L - quite limited in ROM, not  painful.(maybe 90 degrees roughly)  Palpation - pain reported more biased to R side around L4/L5.   Supine bridging x 10 with cuing to push through her heels(felt more in posterior hip and thigh) as appropriate  Soft tissue mobilization performed to lower lumbar spine in area of pain, patient reports this alleviated her symptoms, her flexion ROM increased from mid to shin being able to touch the floor without increase in pain.   Patient performed prone on elbows x 2 bouts of 8 repetitions for 5" holds -- positive response noted.                          PT Education - 02/20/17 1251    Education provided Yes   Education Details provided HEP, course of therapy and educated on walking program.    Person(s) Educated Patient   Methods Explanation;Demonstration;Handout   Comprehension Verbalized understanding;Returned demonstration             PT Long Term Goals - 02/16/17 1544      PT LONG TERM GOAL #1   Title Patient will be able to walk 1 mile without onset of pain to demonstrate improved tolerance for ADLs.    Baseline Reports pain from prolonged standing or short distance walking.    Time 6   Period Weeks   Status New     PT LONG TERM GOAL #2   Title Patient will be able ot sit in a chair for at least 30 minutes with no increase in pain to demonstrate improved tolerance for ADLs.   Baseline Unable to sit more than a few minutes before increase in pain.    Time 6   Period Weeks   Status New     PT LONG TERM GOAL #3   Title Patient will report mODI score of less than 30% disability to demonstrate improved tolerance for ADLs.    Baseline 40%   Time 6   Period Weeks   Status New               Plan - 02/20/17 1249    Clinical Impression Statement Patient is a 61 y/o female with long history of lower back pain, with intermittent reports of pain down her legs. She demonstrates significant ROM deficits in trunk flexion initially, much improved with  soft tissue mobilization. She demonstrates weakness of her LEs and decreased ROM around hip flexors/knee extensors. She reports significant improvement  in pain and ROM (able to touch the floor to end session) with soft tissue mobilization. She would benefit  from LE strengthening and stretching to improve her symptoms of low back pain to allow return to ADLs and recreational activities.    Rehab Potential Fair   Clinical Impairments Affecting Rehab Potential Prolonged nature of symptoms   PT Frequency 2x / week   PT Duration 6 weeks   PT Treatment/Interventions Aquatic Therapy;Moist Heat;Iontophoresis 4mg /ml Dexamethasone;Biofeedback;Cryotherapy;Ultrasound;Therapeutic exercise;Therapeutic activities;Functional mobility training;Gait training;Stair training;Neuromuscular re-education;Patient/family education;Balance training;Taping;Manual techniques;Dry needling   PT Home Exercise Plan Supine bridging, prone on elbows, walking    Consulted and Agree with Plan of Care Patient      Patient will benefit from skilled therapeutic intervention in order to improve the following deficits and impairments:  Abnormal gait, Pain, Hypomobility, Decreased strength, Decreased balance, Decreased mobility  Visit Diagnosis: Chronic bilateral low back pain without sciatica - Plan: PT plan of care cert/re-cert  Muscle weakness - Plan: PT plan of care cert/re-cert  Pain in thoracic spine - Plan: PT plan of care cert/re-cert     Problem List Patient Active Problem List   Diagnosis Date Noted  . Insomnia 06/21/2016  . Elevated uric acid in blood 06/21/2016  . Recurrent boils 06/21/2016  . Pain in left knee 06/06/2016  . Allergic rhinitis 06/06/2016  . Abscess of vulva 06/06/2016  . Low back pain 06/06/2016  . Major depressive disorder, recurrent episode, moderate (HCC) 06/06/2016  . CKD (chronic kidney disease), stage III 06/06/2016  . Diabetes (HCC) 05/25/2016  . Hypertension 05/25/2016  . RLS  (restless legs syndrome) 05/25/2016  . Arthritis of hand, right 05/25/2016  . Sleep apnea 05/25/2016  . Prominent metatarsal head 01/01/2016  . Hammertoe 01/01/2016  . Numbness and tingling 09/08/2015  . 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 [place], [address] Phone: [phone]   Fax:  [phone]  Name: Brodi Nery MRN: [id number] Date of Birth: 10-26-1956

## 2017-02-20 NOTE — Assessment & Plan Note (Signed)
This is complicated with low back and right shoulder pain.  Pt probably should stop Tramadol, expecially with falls.  I thing PT is her best option and she is going now.  I will see her back in one month

## 2017-02-20 NOTE — Assessment & Plan Note (Signed)
Stable, continue present medications.   

## 2017-02-20 NOTE — Progress Notes (Signed)
BP 116/72 (BP Location: Left Arm, Patient Position: Sitting, Cuff Size: Large)   Pulse 79   Temp 98.8 F (37.1 C)   Ht 5' 5.5" (1.664 m)   Wt 195 lb 12.8 oz (88.8 kg)   LMP  (LMP Unknown)   SpO2 96%   BMI 32.09 kg/m    Subjective:    Patient ID: Kim Thomas, female    DOB: 1956-04-03, 61 y.o.   MRN: [id number]  HPI: Kim Thomas is a 61 y.o. female  Chief Complaint  Patient presents with  . Medicare Wellness  . Diabetes    pt states she has eye exam scheduled for next month   Functional Status Survey: Is the patient deaf or have difficulty hearing?: No Does the patient have difficulty seeing, even when wearing glasses/contacts?: Yes Does the patient have difficulty concentrating, remembering, or making decisions?: Yes (remembering) Does the patient have difficulty walking or climbing stairs?: Yes Does the patient have difficulty dressing or bathing?: Yes Does the patient have difficulty doing errands alone such as visiting a doctor's office or shopping?: No  Fall Risk  02/20/2017  Falls in the past year? Yes  Number falls in past yr: 2 or more  Injury with Fall? No   Depression screen PHQ 2/9 02/20/2017  Decreased Interest 1  Down, Depressed, Hopeless 0  PHQ - 2 Score 1  Altered sleeping 0  Tired, decreased energy 0  Change in appetite 0  Feeling bad or failure about yourself  0  Trouble concentrating 0  Moving slowly or fidgety/restless 0  Suicidal thoughts 0  PHQ-9 Score 1    Fall Risk  02/20/2017  Falls in the past year? Yes  Number falls in past yr: 2 or more  Injury with Fall? No   Social History   Social History  . Marital status: Single    Spouse name: N/A  . Number of children: N/A  . Years of education: N/A   Social History Main Topics  . Smoking status: Former Smoker    Quit date: 05/21/2011  . Smokeless tobacco: Never Used  . Alcohol use No  . Drug use: No  . Sexual activity: Not Currently   Other Topics Concern  . None   Social  History Narrative  . None     Past Medical History:  Diagnosis Date  . Anxiety   . Chronic kidney disease   . DDD (degenerative disc disease), cervical   . Diabetes mellitus without complication (HCC)   . Gout   . Hypertension   . Sleep apnea    Past Surgical History:  Procedure Laterality Date  . BREAST CYST EXCISION Left   . c section     x2  . CHOLECYSTECTOMY    . FOOT SURGERY Bilateral   . KNEE ARTHROSCOPY Left 07/18/2016   Procedure: ARTHROSCOPY KNEE;  Surgeon: Donato Heinz, MD;  Location: [facility];  Service: Orthopedics;  Laterality: Left;  . KNEE ARTHROSCOPY WITH LATERAL MENISECTOMY Left 07/18/2016   Procedure: KNEE ARTHROSCOPY WITH PARTIAL  LATERAL MENISECTOMY, CHONDROPLASTY LATERAL FEMORAL CONDYLE;  Surgeon: Donato Heinz, MD;  Location: [facility];  Service: Orthopedics;  Laterality: Left;  . SPINE SURGERY  11/09/11   C5  . TUBAL LIGATION      Family History  Problem Relation Age of Onset  . Cancer Mother     breast  . Hypertension Mother   . Hyperlipidemia Mother   . Diabetes Mother   . Glaucoma Mother   .  Cataracts Mother   . Cancer Father   . Cancer Maternal Grandfather   . Alcohol abuse Neg Hx     Diabetes: Using medications without difficulties No hypoglycemic episodes No hyperglycemic episodes Feet problems: none Blood Sugars averaging: eye exam within last year Last Hgb A1C:6.2  Hypertension  Using medications without difficulty Average home BPs   Using medication without problems or lightheadedness No chest pain with exertion or shortness of breath No Edema  Elevated Cholesterol Using medications without problems No Muscle aches  Diet:  Exercise:  Pain Pt is taking Tramadol 3 times/day  for more that 5 years.  She is getting physical therapy for her lower back.  She also does not like the idea of taking a narcotic.  Pt states this bothers her with the wight of her purse and begins to feel heavier throughout the day.  It radiates to  the back of her arm.  She went once to a pain management doctor but is planning on not going again.  She is willing to go to a pain management doctor but not if she needs to see a counselor as well.    Recurrent boils Taking Doxycycline BID and she would like to stop as she is not having problems currently   Relevant past medical, surgical, family and social history reviewed and updated as indicated. Interim medical history since our last visit reviewed. Allergies and medications reviewed and updated.  Review of Systems  Constitutional: Negative.   HENT: Negative.   Respiratory: Negative.   Neurological: Negative.   Psychiatric/Behavioral: Negative.     Per HPI unless specifically indicated above     Objective:    BP 116/72 (BP Location: Left Arm, Patient Position: Sitting, Cuff Size: Large)   Pulse 79   Temp 98.8 F (37.1 C)   Ht 5' 5.5" (1.664 m)   Wt 195 lb 12.8 oz (88.8 kg)   LMP  (LMP Unknown)   SpO2 96%   BMI 32.09 kg/m   Wt Readings from Last 3 Encounters:  02/20/17 195 lb 12.8 oz (88.8 kg)  07/22/16 200 lb 12.8 oz (91.1 kg)  07/18/16 198 lb (89.8 kg)    Physical Exam  Constitutional: She is oriented to person, place, and time. She appears well-developed and well-nourished.  HENT:  Head: Normocephalic and atraumatic.  Eyes: Pupils are equal, round, and reactive to light. Right eye exhibits no discharge. Left eye exhibits no discharge. No scleral icterus.  Neck: Normal range of motion. Neck supple. Carotid bruit is not present. No thyromegaly present.  Cardiovascular: Normal rate, regular rhythm and normal heart sounds.  Exam reveals no gallop and no friction rub.   No murmur heard. Pulmonary/Chest: Effort normal and breath sounds normal. No respiratory distress. She has no wheezes. She has no rales.  Abdominal: Soft. Bowel sounds are normal. There is no tenderness. There is no rebound.  Genitourinary: Vagina normal and uterus normal. No breast swelling,  tenderness or discharge. Cervix exhibits no motion tenderness, no discharge and no friability. Right adnexum displays no mass, no tenderness and no fullness. Left adnexum displays no mass, no tenderness and no fullness.  Musculoskeletal: Normal range of motion.  Lymphadenopathy:    She has no cervical adenopathy.  Neurological: She is alert and oriented to person, place, and time.  Skin: Skin is warm, dry and intact. No rash noted.  Psychiatric: She has a normal mood and affect. Her speech is normal and behavior is normal. Judgment and thought content normal. Cognition  and memory are normal.    Results for orders placed or performed in visit on 07/22/16  Lipid Panel Piccolo, [employer]  Result Value Ref Range   Cholesterol Piccolo, Waived 153 <200 mg/dL   HDL Chol Piccolo, Waived 68 >59 mg/dL   Triglycerides Piccolo,Waived 87 <150 mg/dL   Chol/HDL Ratio Piccolo,Waive 2.2 mg/dL   LDL Chol Calc Piccolo Waived 68 <100 mg/dL   VLDL Chol Calc Piccolo,Waive 17 <30 mg/dL  Bayer DCA Hb Z6X Waived  Result Value Ref Range   Bayer DCA Hb A1c Waived 6.2 <7.0 %  Comprehensive metabolic panel  Result Value Ref Range   Glucose 114 (H) 65 - 99 mg/dL   BUN 11 8 - 27 mg/dL   Creatinine, Ser 0.96 (H) 0.57 - 1.00 mg/dL   GFR calc non Af Amer 58 (L) >59 mL/min/1.73   GFR calc Af Amer 67 >59 mL/min/1.73   BUN/Creatinine Ratio 10 (L) 12 - 28   Sodium 145 (H) 134 - 144 mmol/L   Potassium 4.4 3.5 - 5.2 mmol/L   Chloride 106 96 - 106 mmol/L   CO2 24 18 - 29 mmol/L   Calcium 9.4 8.7 - 10.3 mg/dL   Total Protein 6.8 6.0 - 8.5 g/dL   Albumin 3.6 3.6 - 4.8 g/dL   Globulin, Total 3.2 1.5 - 4.5 g/dL   Albumin/Globulin Ratio 1.1 (L) 1.2 - 2.2   Bilirubin Total 0.2 0.0 - 1.2 mg/dL   Alkaline Phosphatase 71 39 - 117 IU/L   AST 27 0 - 40 IU/L   ALT 25 0 - 32 IU/L      Assessment & Plan:   Problem List Items Addressed This Visit      Unprioritized   Advanced care planning/counseling discussion    A  voluntary discussion about advance care planning including the explanation and discussion of advance directives was extensively discussed  with the patient.  Explanation about the health care proxy and Living will was reviewed and packet with forms with explanation of how to fill them out was given.  During this discussion, the patient was able to identify a health care proxy as her sister Graciela Husbands and plans to fill out the paperwork required.  Patient was offered a separate Advance Care Planning visit for further assistance with forms.  She thinks she does not want chest compressions but will talk to her sister.         Diabetes (HCC)    Good control last visit..  Will check Hgb A1C and adjust meds as needed      Relevant Medications   bisoprolol-hydrochlorothiazide (ZIAC) 2.5-6.25 MG tablet   metFORMIN (GLUCOPHAGE) 500 MG tablet   Other Relevant Orders   Comprehensive metabolic panel   Lipid Panel w/o Chol/HDL Ratio   Microalbumin, Urine Waived   Hypertension    Stable, continue present medications.        Relevant Medications   bisoprolol-hydrochlorothiazide (ZIAC) 2.5-6.25 MG tablet   Other Relevant Orders   Comprehensive metabolic panel   Lipid Panel w/o Chol/HDL Ratio   Low back pain    Going back to PT.  Will cut back on Tramadol and replace with Tylenol.        Pain management - Primary    This is complicated with low back and right shoulder pain.  Pt probably should stop Tramadol, expecially with falls.  I thing PT is her best option and she is going now.  I will see her back in one month  Recurrent boils    Stop Doxycycline and see how she does as she wants to come off this midication      Risk for falls    She states she has fallen 4 times since the last visit.  However, this has been in the bathroom and she [facility]  Check monthly for continued managment       Other Visit Diagnoses    Nontraumatic shoulder pain, right         Routine general medical examination at a health care facility       Relevant Orders   MM DIGITAL SCREENING BILATERAL   Pap Lb, rfx HPV ASCU   Annual physical exam           Follow up plan: Return in about 4 weeks (around 03/20/2017).

## 2017-02-20 NOTE — Assessment & Plan Note (Addendum)
A voluntary discussion about advance care planning including the explanation and discussion of advance directives was extensively discussed  with the patient.  Explanation about the health care proxy and Living will was reviewed and packet with forms with explanation of how to fill them out was given.  During this discussion, the patient was able to identify a health care proxy as her sister Kim Thomas and plans to fill out the paperwork required.  Patient was offered a separate Advance Care Planning visit for further assistance with forms.  She thinks she does not want chest compressions but will talk to her sister.

## 2017-02-20 NOTE — Assessment & Plan Note (Signed)
Stop Doxycycline and see how she does as she wants to come off this midication

## 2017-02-20 NOTE — Assessment & Plan Note (Addendum)
Good control last visit..  Will check Hgb A1C and adjust meds as needed

## 2017-02-20 NOTE — Assessment & Plan Note (Signed)
Going back to PT.  Will cut back on Tramadol and replace with Tylenol.

## 2017-02-21 ENCOUNTER — Encounter: Payer: Self-pay | Admitting: Unknown Physician Specialty

## 2017-02-21 ENCOUNTER — Ambulatory Visit: Payer: Medicare Other | Admitting: Physical Therapy

## 2017-02-21 DIAGNOSIS — M546 Pain in thoracic spine: Secondary | ICD-10-CM

## 2017-02-21 DIAGNOSIS — M545 Low back pain, unspecified: Secondary | ICD-10-CM

## 2017-02-21 DIAGNOSIS — M6281 Muscle weakness (generalized): Secondary | ICD-10-CM

## 2017-02-21 DIAGNOSIS — G8929 Other chronic pain: Secondary | ICD-10-CM

## 2017-02-21 LAB — COMPREHENSIVE METABOLIC PANEL
ALT: 19 IU/L (ref 0–32)
AST: 23 IU/L (ref 0–40)
Albumin/Globulin Ratio: 1.1 — ABNORMAL LOW (ref 1.2–2.2)
Albumin: 4 g/dL (ref 3.6–4.8)
Alkaline Phosphatase: 77 IU/L (ref 39–117)
BUN/Creatinine Ratio: 14 (ref 12–28)
BUN: 15 mg/dL (ref 8–27)
Bilirubin Total: <0.2 mg/dL (ref 0.0–1.2)
CO2: 24 mmol/L (ref 18–29)
Calcium: 9.7 mg/dL (ref 8.7–10.3)
Chloride: 99 mmol/L (ref 96–106)
Creatinine, Ser: 1.04 mg/dL — ABNORMAL HIGH (ref 0.57–1.00)
GFR calc Af Amer: 67 mL/min/{1.73_m2} (ref >59)
GFR calc non Af Amer: 58 mL/min/{1.73_m2} — ABNORMAL LOW (ref >59)
Globulin, Total: 3.6 g/dL (ref 1.5–4.5)
Glucose: 98 mg/dL (ref 65–99)
Potassium: 4.9 mmol/L (ref 3.5–5.2)
Sodium: 139 mmol/L (ref 134–144)
Total Protein: 7.6 g/dL (ref 6.0–8.5)

## 2017-02-21 LAB — LIPID PANEL W/O CHOL/HDL RATIO
Cholesterol, Total: 184 mg/dL (ref 100–199)
HDL: 61 mg/dL (ref >39)
LDL Calculated: 81 mg/dL (ref 0–99)
Triglycerides: 210 mg/dL — ABNORMAL HIGH (ref 0–149)
VLDL Cholesterol Cal: 42 mg/dL — ABNORMAL HIGH (ref 5–40)

## 2017-02-21 NOTE — Patient Instructions (Signed)
Grade II mobilizations from mid thoracic spine to lumbar spine   Elys test  Sit to stand with/without hands 3 x10 with 2 finger assistance   Side stepping with red t-band x 10 for 3 sets bilaterally

## 2017-02-22 ENCOUNTER — Telehealth: Payer: Self-pay | Admitting: Unknown Physician Specialty

## 2017-02-22 NOTE — Telephone Encounter (Signed)
yes

## 2017-02-22 NOTE — Telephone Encounter (Signed)
Routing to provider  

## 2017-02-22 NOTE — Telephone Encounter (Signed)
Called Revonda Standardllison back and let her know that Elnita MaxwellCheryl said this was OK. Revonda Standardllison asked that a new script be sent for the Shingrix. RX written, signed by provider, and faxed to the pharmacy.

## 2017-02-22 NOTE — Therapy (Signed)
[facility] PHYSICAL AND SPORTS MEDICINE [address] [place], [place], [address] Phone: [phone]   Fax:  [phone]  Physical Therapy Treatment  Patient Details  Name: Kim Thomas MRN: [id number] Date of Birth: 1956-04-04 No Data Recorded  Encounter Date: 02/21/2017      PT End of Session - 02/21/17 1556    Visit Number 2   Number of Visits 13   Date for PT Re-Evaluation 04/13/17   PT Start Time 1352   PT Stop Time 1430   PT Time Calculation (min) 38 min   Activity Tolerance Patient tolerated treatment well   Behavior During Therapy [facility] for tasks assessed/performed      Past Medical History:  Diagnosis Date  . Anxiety   . Chronic kidney disease   . DDD (degenerative disc disease), cervical   . Diabetes mellitus without complication (HCC)   . Gout   . Hypertension   . Sleep apnea     Past Surgical History:  Procedure Laterality Date  . BREAST CYST EXCISION Left   . c section     x2  . CHOLECYSTECTOMY    . FOOT SURGERY Bilateral   . KNEE ARTHROSCOPY Left 07/18/2016   Procedure: ARTHROSCOPY KNEE;  Surgeon: Donato HeinzJames P Hooten, MD;  Location: [facility];  Service: Orthopedics;  Laterality: Left;  . KNEE ARTHROSCOPY WITH LATERAL MENISECTOMY Left 07/18/2016   Procedure: KNEE ARTHROSCOPY WITH PARTIAL  LATERAL MENISECTOMY, CHONDROPLASTY LATERAL FEMORAL CONDYLE;  Surgeon: Donato HeinzJames P Hooten, MD;  Location: [facility];  Service: Orthopedics;  Laterality: Left;  . SPINE SURGERY  11/09/11   C5  . TUBAL LIGATION      There were no vitals filed for this visit.      Subjective Assessment - 02/21/17 1554    Subjective Patient reports her lower back was feeling like it was "bulging out" yesterday but is not feeling that today. Reports that her neck and upper back were painful/tight with prone on elbows because she was lifting her head up. She reports her L knee is still swelling and not bending like it should after surgery (meniscus tear on both  sides).    Pertinent History ACDF C6-C7   Limitations Sitting;Lifting;Standing;Walking   How long can you sit comfortably? Depending on the surface, she reports it can be immediate pain or take several minutes.    Diagnostic tests MRI in 07/2016 indicative of mild degenerative changes in lumbar spine.    Patient Stated Goals To have less pain    Currently in Pain? Yes   Pain Onset More than a month ago   Pain Frequency Intermittent      Grade II mobilizations from mid thoracic spine to lumbar spine roughly 3 bouts at each level for 30" -- well tolerated, initially sensitive but decreased with repeated bouts.   Elys test x 3 bouts x 10 repetitions bilaterally for 5-10" holds with oscillations (initially quite limited and uncomfortable but decreased with repeated bouts).   Sit to stand with/without hands 3 x10 with 2 finger assistance   Side stepping with red t-band x 10 for 3 sets bilaterally (challenging)   Palloff press with yellow t-band x 8 (not challenging) x10 bilaterally with yellow and red t-band for 1 set with 5" holds bilaterally                            PT Education - 02/21/17 1556    Education provided Yes  Education Details Emphasized the need for strengthening of gluteal musculature along with core stability activities for optimal pain control.    Person(s) Educated Patient   Methods Explanation;Demonstration   Comprehension Verbalized understanding;Returned demonstration             PT Long Term Goals - 02/16/17 1544      PT LONG TERM GOAL #1   Title Patient will be able to walk 1 mile without onset of pain to demonstrate improved tolerance for ADLs.    Baseline Reports pain from prolonged standing or short distance walking.    Time 6   Period Weeks   Status New     PT LONG TERM GOAL #2   Title Patient will be able ot sit in a chair for at least 30 minutes with no increase in pain to demonstrate improved tolerance for ADLs.    Baseline Unable to sit more than a few minutes before increase in pain.    Time 6   Period Weeks   Status New     PT LONG TERM GOAL #3   Title Patient will report mODI score of less than 30% disability to demonstrate improved tolerance for ADLs.    Baseline 40%   Time 6   Period Weeks   Status New               Plan - 02/21/17 1607    Clinical Impression Statement Patient reports feeling appropriately fatigued after this session, but does not report back pain during session. While certainly not solely responsible for her complaints, she certainly has deconditioning and disuse atrophy of major LE muscle groups which need to be addressed for optimal rehabilitation.    Rehab Potential Fair   Clinical Impairments Affecting Rehab Potential Prolonged nature of symptoms   PT Frequency 2x / week   PT Duration 6 weeks   PT Treatment/Interventions Aquatic Therapy;Moist Heat;Iontophoresis 4mg /ml Dexamethasone;Biofeedback;Cryotherapy;Ultrasound;Therapeutic exercise;Therapeutic activities;Functional mobility training;Gait training;Stair training;Neuromuscular re-education;Patient/family education;Balance training;Taping;Manual techniques;Dry needling   PT Home Exercise Plan Supine bridging, prone on elbows, walking    Consulted and Agree with Plan of Care Patient      Patient will benefit from skilled therapeutic intervention in order to improve the following deficits and impairments:  Abnormal gait, Pain, Hypomobility, Decreased strength, Decreased balance, Decreased mobility  Visit Diagnosis: Chronic bilateral low back pain without sciatica  Muscle weakness  Pain in thoracic spine     Problem List Patient Active Problem List   Diagnosis Date Noted  . Advanced care planning/counseling discussion 02/20/2017  . Risk for falls 02/20/2017  . Pain management 02/20/2017  . Insomnia 06/21/2016  . Elevated uric acid in blood 06/21/2016  . Recurrent boils 06/21/2016  . Pain in left  knee 06/06/2016  . Allergic rhinitis 06/06/2016  . Abscess of vulva 06/06/2016  . Low back pain 06/06/2016  . Major depressive disorder, recurrent episode, moderate (HCC) 06/06/2016  . CKD (chronic kidney disease), stage III 06/06/2016  . Diabetes (HCC) 05/25/2016  . Hypertension 05/25/2016  . RLS (restless legs syndrome) 05/25/2016  . Arthritis of hand, right 05/25/2016  . Sleep apnea 05/25/2016  . Prominent metatarsal head 01/01/2016  . Hammertoe 01/01/2016  . Numbness and tingling 09/08/2015  . 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 [place], [address] Phone: [phone]   Fax:  [phone]  Name: Kim Thomas MRN: [id number]  Date of Birth: February 15, 1956

## 2017-02-22 NOTE — Telephone Encounter (Signed)
Revonda StandardAllison pharmacist with [employer] [facility] called regarding the shingle vac on patient, they do not have the old version of this vac but have the new shingle vac and want to know if patient can be given this vac instead  Revonda Standardllison [phone]  Thanks

## 2017-02-23 ENCOUNTER — Ambulatory Visit: Payer: Medicare Other | Admitting: Physical Therapy

## 2017-02-23 DIAGNOSIS — G8929 Other chronic pain: Secondary | ICD-10-CM

## 2017-02-23 DIAGNOSIS — M545 Low back pain: Principal | ICD-10-CM

## 2017-02-23 DIAGNOSIS — M6281 Muscle weakness (generalized): Secondary | ICD-10-CM

## 2017-02-23 DIAGNOSIS — M546 Pain in thoracic spine: Secondary | ICD-10-CM

## 2017-02-23 NOTE — Patient Instructions (Signed)
5x sit to stand - 12.03 seconds without hands. 9.85 seconds on second bout.   On blue foam reaching backwards   BP - 130/62  HR -- 81

## 2017-02-23 NOTE — Therapy (Signed)
[facility] [facility] PHYSICAL AND SPORTS MEDICINE [address] [place], [place], [address] Phone: [phone]   Fax:  [phone]  Physical Therapy Treatment  Patient Details  Name: Kim Thomas MRN: [id number] Date of Birth: [birth year] No Data Recorded  Encounter Date: 02/23/2017      PT End of Session - 02/23/17 1802    Visit Number 3   Number of Visits 13   Date for PT Re-Evaluation 04/13/17   PT Start Time 1508   PT Stop Time 1546   PT Time Calculation (min) 38 min   Activity Tolerance Patient tolerated treatment well   Behavior During Therapy [facility] for tasks assessed/performed      Past Medical History:  Diagnosis Date  . Anxiety   . Chronic kidney disease   . DDD (degenerative disc disease), cervical   . Diabetes mellitus without complication (HCC)   . Gout   . Hypertension   . Sleep apnea     Past Surgical History:  Procedure Laterality Date  . BREAST CYST EXCISION Left   . c section     x2  . CHOLECYSTECTOMY    . FOOT SURGERY Bilateral   . KNEE ARTHROSCOPY Left 07/18/2016   Procedure: ARTHROSCOPY KNEE;  Surgeon: Donato HeinzJames P Hooten, MD;  Location: [facility];  Service: Orthopedics;  Laterality: Left;  . KNEE ARTHROSCOPY WITH LATERAL MENISECTOMY Left 07/18/2016   Procedure: KNEE ARTHROSCOPY WITH PARTIAL  LATERAL MENISECTOMY, CHONDROPLASTY LATERAL FEMORAL CONDYLE;  Surgeon: Donato HeinzJames P Hooten, MD;  Location: [facility];  Service: Orthopedics;  Laterality: Left;  . SPINE SURGERY  11/09/11   C5  . TUBAL LIGATION      There were no vitals filed for this visit.      Subjective Assessment - 02/23/17 1513    Subjective Patient reports her back has been feeling better, but she had a fall just before coming to clinic. Reports she made it down the stairs and just fell, did not lose consciousness and reports no residual pain from the fall, though she did chip her pinky finger.    Pertinent History ACDF C6-C7   Limitations  Sitting;Lifting;Standing;Walking   How long can you sit comfortably? Depending on the surface, she reports it can be immediate pain or take several minutes.    Diagnostic tests MRI in 07/2016 indicative of mild degenerative changes in lumbar spine.    Patient Stated Goals To have less pain    Currently in Pain? No/denies   Pain Onset --      5x sit to stand - 12.03 seconds without hands. 9.85 seconds on second bout.   On blue foam reaching backwards in modified single leg deadlift/reverse lunge to stress single leg balance/strength x 10 bilalterally for 2 sets with HHA as needed.   BP - 130/62  HR -- 81  Y Balance Test  R  Anterior - 21 Medial- 61 Lateral - 69  L stance Anterior  - 31 Medial - 73 Lateral -55   TRX sit to stands on single leg initially off balance, however after 8 bilaterally patient reports this was too easy for her.   Single leg stance on rockerboard (oriented medial to lateral) x 8 per side for 2 sets (combination of hip marching and single leg deadlifts with holds up to 5-10" as patient progressed, initially quite unstable and required UE assistance. She reports this was quite challenging on her balance.  PT Education - 02/23/17 1800    Education provided Yes   Education Details Will begin to focus on balance deficits, which appear related to single leg postural response deficits and strength deficits (in single leg).    Person(s) Educated Patient   Methods Explanation;Demonstration;Handout   Comprehension Returned demonstration;Verbalized understanding             PT Long Term Goals - 02/16/17 1544      PT LONG TERM GOAL #1   Title Patient will be able to walk 1 mile without onset of pain to demonstrate improved tolerance for ADLs.    Baseline Reports pain from prolonged standing or short distance walking.    Time 6   Period Weeks   Status New     PT LONG TERM GOAL #2   Title Patient will be able  ot sit in a chair for at least 30 minutes with no increase in pain to demonstrate improved tolerance for ADLs.   Baseline Unable to sit more than a few minutes before increase in pain.    Time 6   Period Weeks   Status New     PT LONG TERM GOAL #3   Title Patient will report mODI score of less than 30% disability to demonstrate improved tolerance for ADLs.    Baseline 40%   Time 6   Period Weeks   Status New               Plan - 02/23/17 1802    Clinical Impression Statement Patient reports a new fall, appears to be most concerned with her balance deficits. She has fairly substantial asymmetries and deficits in Y star balance test.  Patient would benefit from progression of higher level single leg postural response activities given her poor Y balance test results, though given her 5x sit to stand and ability to perform single leg TRX sit to stands, strength does not appear to be a culprit behind her falls.    Rehab Potential Fair   Clinical Impairments Affecting Rehab Potential Prolonged nature of symptoms   PT Frequency 2x / week   PT Duration 6 weeks   PT Treatment/Interventions Aquatic Therapy;Moist Heat;Iontophoresis 4mg /ml Dexamethasone;Biofeedback;Cryotherapy;Ultrasound;Therapeutic exercise;Therapeutic activities;Functional mobility training;Gait training;Stair training;Neuromuscular re-education;Patient/family education;Balance training;Taping;Manual techniques;Dry needling   PT Home Exercise Plan Supine bridging, prone on elbows, walking -- standing hip extension and abduction with band on pillow with hand support.    Consulted and Agree with Plan of Care Patient      Patient will benefit from skilled therapeutic intervention in order to improve the following deficits and impairments:  Abnormal gait, Pain, Hypomobility, Decreased strength, Decreased balance, Decreased mobility  Visit Diagnosis: Chronic bilateral low back pain without sciatica  Muscle weakness  Pain in  thoracic spine     Problem List Patient Active Problem List   Diagnosis Date Noted  . Advanced care planning/counseling discussion 02/20/2017  . Risk for falls 02/20/2017  . Pain management 02/20/2017  . Insomnia 06/21/2016  . Elevated uric acid in blood 06/21/2016  . Recurrent boils 06/21/2016  . Pain in left knee 06/06/2016  . Allergic rhinitis 06/06/2016  . Abscess of vulva 06/06/2016  . Low back pain 06/06/2016  . Major depressive disorder, recurrent episode, moderate (HCC) 06/06/2016  . CKD (chronic kidney disease), stage III 06/06/2016  . Diabetes (HCC) 05/25/2016  . Hypertension 05/25/2016  . RLS (restless legs syndrome) 05/25/2016  . Arthritis of hand, right 05/25/2016  . Sleep apnea 05/25/2016  . Prominent  metatarsal head 01/01/2016  . Hammertoe 01/01/2016  . Numbness and tingling 09/08/2015  . 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   Alva Garnet PT, DPT, CSCS    02/23/2017, 6:09 PM  [place] [facility] PHYSICAL AND SPORTS MEDICINE [address] [place], [address] Phone: [phone]   Fax:  [phone]  Name: Kim Thomas MRN: [id number] Date of Birth: October 08, 1956

## 2017-02-24 LAB — PAP LB, RFX HPV ASCU: PAP Smear Comment: 0

## 2017-02-27 ENCOUNTER — Ambulatory Visit: Payer: Medicare Other | Attending: Nurse Practitioner | Admitting: Physical Therapy

## 2017-02-27 ENCOUNTER — Telehealth: Payer: Self-pay | Admitting: Unknown Physician Specialty

## 2017-02-27 DIAGNOSIS — M546 Pain in thoracic spine: Secondary | ICD-10-CM

## 2017-02-27 DIAGNOSIS — G8929 Other chronic pain: Secondary | ICD-10-CM | POA: Insufficient documentation

## 2017-02-27 DIAGNOSIS — M6281 Muscle weakness (generalized): Secondary | ICD-10-CM | POA: Diagnosis present

## 2017-02-27 DIAGNOSIS — R55 Syncope and collapse: Secondary | ICD-10-CM

## 2017-02-27 DIAGNOSIS — M545 Low back pain, unspecified: Secondary | ICD-10-CM

## 2017-02-27 NOTE — Telephone Encounter (Signed)
Pat physical therapist with [facility] needs to speak with Elnita Maxwell regarding this patients falling episodes.    He can be reached at [phone]   Thanks

## 2017-02-27 NOTE — Therapy (Signed)
[facility] PHYS[facility]ORTS MEDICINE [address], [place], [address] Phone: [phone]   Fax:  [phone]  Physical Therapy Treatment  Patient Details  Name: Kim Thomas MRN: [id number] Date of Birth: 06-30-1956 No Data Recorded  Encounter Date: 02/27/2017      PT End of Session - 02/27/17 1551    Visit Number 4   Number of Visits 13   Date for PT Re-Evaluation 04/13/17   PT Start Time 1518   PT Stop Time 1603   PT Time Calculation (min) 45 min   Activity Tolerance Patient tolerated treatment well   Behavior During Therapy [facility] for tasks assessed/performed      Past Medical History:  Diagnosis Date  . Anxiety   . Chronic kidney disease   . DDD (degenerative disc disease), cervical   . Diabetes mellitus without complication (HCC)   . Gout   . Hypertension   . Sleep apnea     Past Surgical History:  Procedure Laterality Date  . BREAST CYST EXCISION Left   . c section     x2  . CHOLECYSTECTOMY    . FOOT SURGERY Bilateral   . KNEE ARTHROSCOPY Left 07/18/2016   Procedure: ARTHROSCOPY KNEE;  Surgeon: Donato Heinz, MD;  Location: [facility];  Service: Orthopedics;  Laterality: Left;  . KNEE ARTHROSCOPY WITH LATERAL MENISECTOMY Left 07/18/2016   Procedure: KNEE ARTHROSCOPY WITH PARTIAL  LATERAL MENISECTOMY, CHONDROPLASTY LATERAL FEMORAL CONDYLE;  Surgeon: Donato Heinz, MD;  Location: [facility];  Service: Orthopedics;  Laterality: Left;  . SPINE SURGERY  11/09/11   C5  . TUBAL LIGATION      There were no vitals filed for this visit.      Subjective Assessment - 02/27/17 1522    Subjective Patient reports no new falls, but is concerned about how the number of falls she has been having has been increasing and getting closer together.    Pertinent History ACDF C6-C7   Limitations Sitting;Lifting;Standing;Walking   How long can you sit comfortably? Depending on the surface, she reports it can be immediate pain or take  several minutes.    Diagnostic tests MRI in 07/2016 indicative of mild degenerative changes in lumbar spine.    Patient Stated Goals To have less pain    Currently in Pain? No/denies    BP sitting - 106/47  HR - 75  Standing - 96/71  HR 81   Tug without AD- 8.12 seconds   99m walk test- 8.08 seconds   FGA - 30/30  Clonus - negative   Patellar reflex - 1+ symmetrical   Plantar reflex - 2+ symmetrical   Babinski- negative bilaterally   VBI testing bilaterally - negative   Deadlifts at 20, 40# x 6 repetitions in each condition (weight) cuing for proper hip hinging, no pain reported in back.   Leg Press - 65, 85, 95# x 12 repetitions in each, initially felt in lumbar spine, but added pillow and denied feeling in lumbar spine, felt increased in LEs.                              PT Education - 02/27/17 1551    Education provided Yes   Education Details Will call MD office about her falls and lack of objective findings in PT.    Person(s) Educated Patient   Methods Explanation;Demonstration   Comprehension Verbalized understanding;Returned demonstration  PT Long Term Goals - 02/16/17 1544      PT LONG TERM GOAL #1   Title Patient will be able to walk 1 mile without onset of pain to demonstrate improved tolerance for ADLs.    Baseline Reports pain from prolonged standing or short distance walking.    Time 6   Period Weeks   Status New     PT LONG TERM GOAL #2   Title Patient will be able ot sit in a chair for at least 30 minutes with no increase in pain to demonstrate improved tolerance for ADLs.   Baseline Unable to sit more than a few minutes before increase in pain.    Time 6   Period Weeks   Status New     PT LONG TERM GOAL #3   Title Patient will report mODI score of less than 30% disability to demonstrate improved tolerance for ADLs.    Baseline 40%   Time 6   Period Weeks   Status New               Plan -  02/27/17 1532    Clinical Impression Statement Patient continues to report decrease in back pain, and ability to safely lift 40# off the floor with no increase in low back pain. She is more concerned currently about falls, but after fairly thorough fall screen assessment, she does not have any objective physical reasons for falls. Briefly screened neurologic causes as well, none of which were remarkable. Called PCP office to discuss any other factors contributing to her falls.    Rehab Potential Fair   Clinical Impairments Affecting Rehab Potential Prolonged nature of symptoms   PT Frequency 2x / week   PT Duration 6 weeks   PT Treatment/Interventions Aquatic Therapy;Moist Heat;Iontophoresis /ml Dexamethasone;Biofeedback;Cryotherapy;Ultrasound;Therapeutic exercise;Therapeutic activities;Functional mobility training;Gait training;Stair training;Neuromuscular re-education;Patient/family education;Balance training;Taping;Manual techniques;Dry needling   PT Home Exercise Plan Supine bridging, prone on elbows, walking -- standing hip extension and abduction with band on pillow with hand support.    Consulted and Agree with Plan of Care Patient      Patient will benefit from skilled therapeutic intervention in order to improve the following deficits and impairments:  Abnormal gait, Pain, Hypomobility, Decreased strength, Decreased balance, Decreased mobility  Visit Diagnosis: Chronic bilateral low back pain without sciatica  Muscle weakness  Pain in thoracic spine     Problem List Patient Active Problem List   Diagnosis Date Noted  . Advanced care planning/counseling discussion 02/20/2017  . Risk for falls 02/20/2017  . Pain management 02/20/2017  . Insomnia 06/21/2016  . Elevated uric acid in blood 06/21/2016  . Recurrent boils 06/21/2016  . Pain in left knee 06/06/2016  . Allergic rhinitis 06/06/2016  . Abscess of vulva 06/06/2016  . Low back pain 06/06/2016  . Major depressive  disorder, recurrent episode, moderate (HCC) 06/06/2016  . CKD (chronic kidney disease), stage III 06/06/2016  . Diabetes (HCC) 05/25/2016  . Hypertension 05/25/2016  . RLS (restless legs syndrome) 05/25/2016  . Arthritis of hand, right 05/25/2016  . Sleep apnea 05/25/2016  . Prominent metatarsal head 01/01/2016  . Hammertoe 01/01/2016  . Numbness and tingling 09/08/2015  . 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    Alva Garnet PT, DPT, CSCS    02/27/2017, 4:24 PM   [facility] PHYSICAL AND SPORTS MEDICINE [address], [place], [address] Phone: [phone]   Fax:  [phone]  Name: Kim Thomas MRN: [id number] Date of Birth: 1955/12/07

## 2017-02-27 NOTE — Telephone Encounter (Signed)
Routing to Cheryl

## 2017-02-27 NOTE — Patient Instructions (Addendum)
BP sitting - 106/47  HR - 75  Standing - 96/71  HR 81   Tug without AD- 8.12 seconds   70m walk test- 8.08 seconds   FGA - 30/30  Clonus - negative   Patellar reflex - 1+ symmetrical   Plantar reflex - 2+ symmetrical   Babinski- negative bilaterally   VBI testing bilaterally - negative

## 2017-02-28 NOTE — Telephone Encounter (Signed)
Tried calling.  Left message and gave my cell phone

## 2017-03-01 ENCOUNTER — Ambulatory Visit: Payer: Medicare Other | Admitting: Physical Therapy

## 2017-03-01 DIAGNOSIS — M546 Pain in thoracic spine: Secondary | ICD-10-CM

## 2017-03-01 DIAGNOSIS — M545 Low back pain, unspecified: Secondary | ICD-10-CM

## 2017-03-01 DIAGNOSIS — M6281 Muscle weakness (generalized): Secondary | ICD-10-CM

## 2017-03-01 DIAGNOSIS — G8929 Other chronic pain: Secondary | ICD-10-CM

## 2017-03-01 LAB — HM DIABETES FOOT EXAM: HM Diabetic Foot Exam: NORMAL

## 2017-03-01 NOTE — Patient Instructions (Signed)
Patient reports pain "deep" in the R upper trapezius and into her R shoulder. Reports this has been constant. Reports she gets a burning sensation in the shoulder blade if she uses her RUE too much. She is R handed. She has had this for as long as she has had her back pain.  Has not been overtly dropping items.   AROM flexion - WNL bilaterally but painful on R shoulder at end range   R rotation -23 degrees  L rotation - 46 degrees  Flexion felt relieving  Extension -- felt pulling sensation on R side  Felt "pulling" bilaterally with side flexion (R>L)   R ER PROM - to 90 but painful in lateral part of neck, IR limited, painful around bicipital groove   IR- mild pain, 5/5, ER/abduction, extension, biceps 5/5 no pain. Mild pain with press out but 5/5.   Pain reported in rhomboids, upper trapezius, provided STM   Grip strength  R - 25# x 2, L 50,60#   120/70 BP

## 2017-03-01 NOTE — Telephone Encounter (Signed)
Discussion with PT and strength seems to be fine.  He thinks she is having "drop attacks" in which she suddenly loses strength.  Will set up a neurology referral

## 2017-03-01 NOTE — Telephone Encounter (Signed)
Called and spoke to patient. I let her know about neurology referral and patient stated that she already sees Dr. Malvin Johns for neurology. She states that she has not seen him in months but asked if we wanted her to call to schedule an appointment with him and I told her yes please. I asked for her to give [place] a call back to let us know when her appointment is.

## 2017-03-01 NOTE — Telephone Encounter (Signed)
Routing to provider, just an FYI

## 2017-03-01 NOTE — Telephone Encounter (Signed)
Patient called to let cheryl know the appt with Dr Malvin Johns, Neurology on 4/30 @ 12:45.  This is the earliest appt since Dr Malvin Johns was going to be out of town.

## 2017-03-06 ENCOUNTER — Ambulatory Visit: Payer: Medicare Other | Admitting: Physical Therapy

## 2017-03-06 NOTE — Therapy (Signed)
[facility] PH[facility] MEDICINE [address] [place], [address] Phone: [phone]   Fax:  [phone]  Physical Therapy Treatment  Patient Details  Name: Kim Thomas MRN: [id number] Date of Birth: 1956-08-10 No Data Recorded  Encounter Date: 03/01/2017      PT End of Session - 03/06/17 0814    Visit Number 5   Number of Visits 13   Date for PT Re-Evaluation 04/13/17   PT Start Time 0950   PT Stop Time 1030   PT Time Calculation (min) 40 min   Activity Tolerance Patient tolerated treatment well   Behavior During Therapy [facility] for tasks assessed/performed      Past Medical History:  Diagnosis Date  . Anxiety   . Chronic kidney disease   . DDD (degenerative disc disease), cervical   . Diabetes mellitus without complication (HCC)   . Gout   . Hypertension   . Sleep apnea     Past Surgical History:  Procedure Laterality Date  . BREAST CYST EXCISION Left   . c section     x2  . CHOLECYSTECTOMY    . FOOT SURGERY Bilateral   . KNEE ARTHROSCOPY Left 07/18/2016   Procedure: ARTHROSCOPY KNEE;  Surgeon: Donato Heinz, MD;  Location: [facility] ORS;  Service: Orthopedics;  Laterality: Left;  . KNEE ARTHROSCOPY WITH LATERAL MENISECTOMY Left 07/18/2016   Procedure: KNEE ARTHROSCOPY WITH PARTIAL  LATERAL MENISECTOMY, CHONDROPLASTY LATERAL FEMORAL CONDYLE;  Surgeon: Donato Heinz, MD;  Location: [facility] ORS;  Service: Orthopedics;  Laterality: Left;  . SPINE SURGERY  11/09/11   C5  . TUBAL LIGATION      There were no vitals filed for this visit.      Subjective Assessment - 03/06/17 0817    Subjective Patient reports she had a little lower back pain after her last PT session. Reports she was able to go walking yesterday and felt fine with her lower back, she does report that she had some L knee weakness. She says she has not walked that far in a long while. Reports more of her soreness is in her upper back currently.    Pertinent  History ACDF C6-C7   Limitations Sitting;Lifting;Standing;Walking   How long can you sit comfortably? Depending on the surface, she reports it can be immediate pain or take several minutes.    Diagnostic tests MRI in 07/2016 indicative of mild degenerative changes in lumbar spine.    Patient Stated Goals To have less pain    Currently in Pain? Other (Comment)  Patient reports deep ache in R upper trapezius area.                   Patient reports pain "deep" in the R upper trapezius and into her R shoulder. Reports this has been constant. Reports she gets a burning sensation in the shoulder blade if she uses her RUE too much. She is R handed. She has had this for as long as she has had her back pain.  Has not been overtly dropping items.   AROM flexion - WNL bilaterally but painful on R shoulder at end range   R rotation -23 degrees  L rotation - 46 degrees  Flexion felt relieving  Extension -- felt pulling sensation on R side  Felt "pulling" bilaterally with side flexion (R>L)   R ER PROM - to 90 but painful in lateral part of neck, IR limited, painful around bicipital groove  IR- mild pain, 5/5, ER/abduction, extension, biceps 5/5 no pain. Mild pain with press out but 5/5.   Pain reported in rhomboids, upper trapezius, provided STM   Grip strength  R - 25# x 2, L 50,60#   120/70 BP                PT Education - 03/06/17 0816    Education provided Yes   Education Details Will begin to transition to gym based program for maintenance, or discuss working on thoracic pain with MD when able to reach office.    Person(s) Educated Patient   Methods Explanation;Demonstration   Comprehension Verbalized understanding;Returned demonstration             PT Long Term Goals - 02/16/17 1544      PT LONG TERM GOAL #1   Title Patient will be able to walk 1 mile without onset of pain to demonstrate improved tolerance for ADLs.    Baseline Reports pain from  prolonged standing or short distance walking.    Time 6   Period Weeks   Status New     PT LONG TERM GOAL #2   Title Patient will be able ot sit in a chair for at least 30 minutes with no increase in pain to demonstrate improved tolerance for ADLs.   Baseline Unable to sit more than a few minutes before increase in pain.    Time 6   Period Weeks   Status New     PT LONG TERM GOAL #3   Title Patient will report mODI score of less than 30% disability to demonstrate improved tolerance for ADLs.    Baseline 40%   Time 6   Period Weeks   Status New               Plan - 03/06/17 0815    Clinical Impression Statement Patient has not been found to have any objective balance deficits, however she does report R upper trapezius discomfort. PT performed screening of upper quarter, found decreased R rotation in cervical spine relative to L rotation as well as multiple tender areas throughout upper trapezius on R side. Mild deficit in total arc at R shoulder, with discomfort noted at end ranges as well in rotation, elevation was WNL. Patient would benefit from skilled PT services to address ROM deficits which appear to be leading to patient having symptoms.    Rehab Potential Fair   Clinical Impairments Affecting Rehab Potential Prolonged nature of symptoms   PT Frequency 2x / week   PT Duration 6 weeks   PT Treatment/Interventions Aquatic Therapy;Moist Heat;Iontophoresis /ml Dexamethasone;Biofeedback;Cryotherapy;Ultrasound;Therapeutic exercise;Therapeutic activities;Functional mobility training;Gait training;Stair training;Neuromuscular re-education;Patient/family education;Balance training;Taping;Manual techniques;Dry needling   PT Home Exercise Plan Supine bridging, prone on elbows, walking -- standing hip extension and abduction with band on pillow with hand support.    Consulted and Agree with Plan of Care Patient      Patient will benefit from skilled therapeutic intervention in  order to improve the following deficits and impairments:  Abnormal gait, Pain, Hypomobility, Decreased strength, Decreased balance, Decreased mobility  Visit Diagnosis: Chronic bilateral low back pain without sciatica  Muscle weakness  Pain in thoracic spine     Problem List Patient Active Problem List   Diagnosis Date Noted  . Advanced care planning/counseling discussion 02/20/2017  . Risk for falls 02/20/2017  . Pain management 02/20/2017  . Insomnia 06/21/2016  . Elevated uric acid in blood 06/21/2016  . Recurrent boils 06/21/2016  .  Pain in left knee 06/06/2016  . Allergic rhinitis 06/06/2016  . Abscess of vulva 06/06/2016  . Low back pain 06/06/2016  . Major depressive disorder, recurrent episode, moderate (HCC) 06/06/2016  . CKD (chronic kidney disease), stage III 06/06/2016  . Diabetes (HCC) 05/25/2016  . Hypertension 05/25/2016  . RLS (restless legs syndrome) 05/25/2016  . Arthritis of hand, right 05/25/2016  . Sleep apnea 05/25/2016  . Prominent metatarsal head 01/01/2016  . Hammertoe 01/01/2016  . Numbness and tingling 09/08/2015  . 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 [place], [address] Phone: [phone]   Fax:  [phone]  Name: Sela Falk MRN: [id number] Date of Birth: June 29, 1956

## 2017-03-08 ENCOUNTER — Ambulatory Visit
Admission: RE | Admit: 2017-03-08 | Discharge: 2017-03-08 | Disposition: A | Discharge status: Home / Self Care | Payer: Medicare Other | Source: Ambulatory Visit | Attending: Unknown Physician Specialty | Admitting: Unknown Physician Specialty

## 2017-03-08 ENCOUNTER — Encounter: Payer: Self-pay | Admitting: Unknown Physician Specialty

## 2017-03-08 ENCOUNTER — Telehealth: Payer: Self-pay | Admitting: Unknown Physician Specialty

## 2017-03-08 DIAGNOSIS — E2839 Other primary ovarian failure: Secondary | ICD-10-CM

## 2017-03-08 DIAGNOSIS — Z0000 Encounter for general adult medical examination without abnormal findings: Secondary | ICD-10-CM

## 2017-03-08 NOTE — Telephone Encounter (Signed)
Called and let patient know that order was placed for her bone density.

## 2017-03-08 NOTE — Telephone Encounter (Signed)
Routing to provider. Can patient have her bone density ordered?

## 2017-03-08 NOTE — Telephone Encounter (Signed)
Pt would like to know if she could have an order placed to have her bone density scan at her appt today at 3pm. She stated if she could not do both at the same time then she would just reschedule.

## 2017-03-13 ENCOUNTER — Encounter: Payer: Medicare Other | Admitting: Physical Therapy

## 2017-03-15 ENCOUNTER — Encounter: Payer: Medicare Other | Admitting: Physical Therapy

## 2017-03-22 ENCOUNTER — Encounter: Payer: Medicare Other | Admitting: Physical Therapy

## 2017-03-27 ENCOUNTER — Ambulatory Visit (INDEPENDENT_AMBULATORY_CARE_PROVIDER_SITE_OTHER): Payer: Medicare Other | Admitting: Unknown Physician Specialty

## 2017-03-27 ENCOUNTER — Encounter: Payer: Self-pay | Admitting: Unknown Physician Specialty

## 2017-03-27 ENCOUNTER — Encounter: Payer: Medicare Other | Admitting: Physical Therapy

## 2017-03-27 VITALS — BP 114/74 | HR 86 | Temp 98.7°F | Wt 194.4 lb

## 2017-03-27 DIAGNOSIS — I10 Essential (primary) hypertension: Secondary | ICD-10-CM | POA: Diagnosis not present

## 2017-03-27 DIAGNOSIS — R52 Pain, unspecified: Secondary | ICD-10-CM | POA: Diagnosis not present

## 2017-03-27 DIAGNOSIS — E119 Type 2 diabetes mellitus without complications: Secondary | ICD-10-CM | POA: Diagnosis not present

## 2017-03-27 LAB — BAYER DCA HB A1C WAIVED: HB A1C (BAYER DCA - WAIVED): 6.2 % (ref <7.0)

## 2017-03-27 NOTE — Assessment & Plan Note (Signed)
Cut down to Tramadol daily as needed form twice a day as needed

## 2017-03-27 NOTE — Assessment & Plan Note (Signed)
Stable, continue present medications.   

## 2017-03-27 NOTE — Assessment & Plan Note (Signed)
Hgb A1 6.2%.  OK to cut back Metformin to 2/day rather than 3 times/day

## 2017-03-27 NOTE — Progress Notes (Signed)
BP 114/74   Pulse 86   Temp 98.7 F (37.1 C)   Wt 194 lb 6.4 oz (88.2 kg)   LMP  (LMP Unknown)   SpO2 93%   BMI 31.86 kg/m    Subjective:    Patient ID: Kim Thomas, female    DOB: [birth year], 61 y.o.   MRN: [id number]  HPI: Kim Thomas is a 61 y.o. female  Chief Complaint  Patient presents with  . Follow-up    1 month f/up  . Diabetes    pt states it is time to have her A1C checked   Diabetes: Using medications without difficulties Notes 2 hypoglycemic episodes No hyperglycemic episodes Feet problems: none Blood Sugars averaging: 70-90 Last Hgb A1C: 6.2  Hypertension  Using medications without difficulty Average home BPs   Using medication without problems or lightheadedness No chest pain with exertion or shortness of breath No Edema  Pain Pt is taking Tramadol 3 times/day  for more that 5 years.  She is getting physical therapy for her lower back.  Last visit we reduced her Tramadol by 1/day and would like it to be reduced again as she feels like Tylenol works better.   She is willing to go to a pain management doctor but not if she needs to see a counselor as well.  She understands that we cannot prescribe Opioid medications at this office  Relevant past medical, surgical, family and social history reviewed and updated as indicated. Interim medical history since our last visit reviewed. Allergies and medications reviewed and updated.  Review of Systems  Per HPI unless specifically indicated above     Objective:    BP 114/74   Pulse 86   Temp 98.7 F (37.1 C)   Wt 194 lb 6.4 oz (88.2 kg)   LMP  (LMP Unknown)   SpO2 93%   BMI 31.86 kg/m   Wt Readings from Last 3 Encounters:  03/27/17 194 lb 6.4 oz (88.2 kg)  02/20/17 195 lb 12.8 oz (88.8 kg)  07/22/16 200 lb 12.8 oz (91.1 kg)    Physical Exam  Constitutional: She is oriented to person, place, and time. She appears well-developed and well-nourished. No distress.  HENT:  Head: Normocephalic and  atraumatic.  Eyes: Conjunctivae and lids are normal. Right eye exhibits no discharge. Left eye exhibits no discharge. No scleral icterus.  Neck: Normal range of motion. Neck supple. No JVD present. Carotid bruit is not present.  Cardiovascular: Normal rate, regular rhythm and normal heart sounds.   Pulmonary/Chest: Effort normal and breath sounds normal.  Abdominal: Normal appearance. There is no splenomegaly or hepatomegaly.  Musculoskeletal: Normal range of motion.  Neurological: She is alert and oriented to person, place, and time.  Skin: Skin is warm, dry and intact. No rash noted. No pallor.  Psychiatric: She has a normal mood and affect. Her behavior is normal. Judgment and thought content normal.    Results for orders placed or performed in visit on 02/20/17  Comprehensive metabolic panel  Result Value Ref Range   Glucose 98 65 - 99 mg/dL   BUN 15 8 - 27 mg/dL   Creatinine, Ser 4.09 (H) 0.57 - 1.00 mg/dL   GFR calc non Af Amer 58 (L) >59 mL/min/1.73   GFR calc Af Amer 67 >59 mL/min/1.73   BUN/Creatinine Ratio 14 12 - 28   Sodium 139 134 - 144 mmol/L   Potassium 4.9 3.5 - 5.2 mmol/L   Chloride 99 96 - 106 mmol/L  CO2 24 18 - 29 mmol/L   Calcium 9.7 8.7 - 10.3 mg/dL   Total Protein 7.6 6.0 - 8.5 g/dL   Albumin 4.0 3.6 - 4.8 g/dL   Globulin, Total 3.6 1.5 - 4.5 g/dL   Albumin/Globulin Ratio 1.1 (L) 1.2 - 2.2   Bilirubin Total <0.2 0.0 - 1.2 mg/dL   Alkaline Phosphatase 77 39 - 117 IU/L   AST 23 0 - 40 IU/L   ALT 19 0 - 32 IU/L  Lipid Panel w/o Chol/HDL Ratio  Result Value Ref Range   Cholesterol, Total 184 100 - 199 mg/dL   Triglycerides 161 (H) 0 - 149 mg/dL   HDL 61 >09 mg/dL   VLDL Cholesterol Cal 42 (H) 5 - 40 mg/dL   LDL Calculated 81 0 - 99 mg/dL  Microalbumin, Urine Waived  Result Value Ref Range   Microalb, Ur Waived 10 0 - 19 mg/L   Creatinine, Urine Waived 50 10 - 300 mg/dL   Microalb/Creat Ratio 30-300 (H) <30 mg/g  Pap Lb, rfx HPV ASCU  Result Value Ref  Range   DIAGNOSIS: Comment    Specimen adequacy: Comment    CLINICIAN PROVIDED ICD10: Comment    Performed by: Comment    QC reviewed by: Comment    PAP SMEAR COMMENT .    Note: Comment    PAP REFLEX: Comment        Assessment & Plan:   Problem List Items Addressed This Visit      Unprioritized   Diabetes (HCC) - Primary    Hgb A1 6.2%.  OK to cut back Metformin to 2/day rather than 3 times/day      Relevant Orders   Bayer DCA Hb A1c Waived   Hypertension    Stable, continue present medications.         Pain management    Cut down to Tramadol daily as needed form twice a day as needed          Follow up plan: Return in about 3 months (around 06/26/2017) for Hgb A1C, uric acid and titrate medications further.

## 2017-03-29 ENCOUNTER — Encounter: Payer: Medicare Other | Admitting: Physical Therapy

## 2017-04-26 ENCOUNTER — Other Ambulatory Visit: Payer: Self-pay | Admitting: Unknown Physician Specialty

## 2017-05-17 ENCOUNTER — Encounter: Payer: Self-pay | Admitting: Family Medicine

## 2017-05-17 ENCOUNTER — Ambulatory Visit (INDEPENDENT_AMBULATORY_CARE_PROVIDER_SITE_OTHER): Payer: Medicare Other | Admitting: Family Medicine

## 2017-05-17 VITALS — BP 118/75 | HR 93 | Wt 196.0 lb

## 2017-05-17 DIAGNOSIS — M25512 Pain in left shoulder: Secondary | ICD-10-CM

## 2017-05-17 MED ORDER — PREDNISONE 10 MG PO TABS: ORAL_TABLET | ORAL | 0 refills | Status: DC

## 2017-05-17 MED ORDER — KETOROLAC TROMETHAMINE 60 MG/2ML IM SOLN
60.0000 mg | Freq: Once | INTRAMUSCULAR | Status: AC
  Administered 2017-05-17: 60 mg via INTRAMUSCULAR

## 2017-05-17 NOTE — Progress Notes (Signed)
BP 118/75   Pulse 93   Wt 196 lb (88.9 kg)   LMP  (LMP Unknown)   SpO2 100%   BMI 32.12 kg/m    Subjective:    Patient ID: Kim Thomas, female    DOB: Apr 29, 1956, 61 y.o.   MRN: [id number]  HPI: Kim Thomas is a 61 y.o. female  Chief Complaint  Patient presents with  . Back Pain    Shoulder blade. Happened on Friday. pain worsening. Pain has traveled down left arm down to wrist. Loss of strenght.    Left sided, severe shoulder pain that seems to extend up into trapezius muscle. Sxs started after she moved a large case of water from [employer] 5-6 days ago. Only alleviating factor is laying on back with weight on right side and holding arm still. Having increasing numbness, weakness, and tingling traveling down arm as days progress. Had some zanaflex and nucynta left at home from something else and has been taking them regularly with some relief. 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 Denies CP, SOB, abdominal pain, fever, chills.   Relevant past medical, surgical, family and social history reviewed and updated as indicated. Interim medical history since our last visit reviewed. Allergies and medications reviewed and updated.  Review of Systems  HENT: Negative.   Eyes: Negative.   Respiratory: Negative.   Cardiovascular: Negative.   Gastrointestinal: Negative.   Genitourinary: Negative.   Musculoskeletal: Positive for arthralgias and myalgias.  Neurological: Positive for weakness and numbness.  Psychiatric/Behavioral: Negative.    Per HPI unless specifically indicated above     Objective:    BP 118/75   Pulse 93   Wt 196 lb (88.9 kg)   LMP  (LMP Unknown)   SpO2 100%   BMI 32.12 kg/m   Wt Readings from Last 3 Encounters:  05/17/17 196 lb (88.9 kg)  03/27/17 194 lb 6.4 oz (88.2 kg)  02/20/17 195 lb 12.8 oz (88.8 kg)    Physical Exam  Constitutional: She is oriented to person,  place, and time. She appears well-developed and well-nourished. No distress.  HENT:  Head: Atraumatic.  Eyes: Conjunctivae are normal. Pupils are equal, round, and reactive to light.  Neck: Normal range of motion. Neck supple.  Cardiovascular: Normal rate and normal heart sounds.   Pulmonary/Chest: Effort normal and breath sounds normal. No respiratory distress.  Abdominal: Soft. Bowel sounds are normal. She exhibits no distension. There is no tenderness.  Musculoskeletal: She exhibits tenderness (TTP of left trapezius ). She exhibits no edema or deformity.  Passive ROM mostly intact at left shoulder, but painful per pt  Neurological: She is alert and oriented to person, place, and time.  Skin: Skin is warm and dry.  Psychiatric: She has a normal mood and affect. Her behavior is normal.  Nursing note and vitals reviewed.     Assessment & Plan:   Problem List Items Addressed This Visit    None    Visit Diagnoses    Acute pain of left shoulder    -  Primary   Suspect muscle strain causing some nerve irritation. Start prednisone taper, continue flexeril and nucynta. Soaks, rest, massage. F/u if no improvement   Relevant Medications   ketorolac (TORADOL) injection 60 mg (Completed)       Follow up plan: Return if symptoms worsen or fail to improve.

## 2017-05-23 ENCOUNTER — Ambulatory Visit (INDEPENDENT_AMBULATORY_CARE_PROVIDER_SITE_OTHER): Payer: Medicare Other | Admitting: Unknown Physician Specialty

## 2017-05-23 ENCOUNTER — Encounter: Payer: Self-pay | Admitting: Unknown Physician Specialty

## 2017-05-23 VITALS — BP 104/66 | HR 81 | Temp 98.2°F | Wt 198.0 lb

## 2017-05-23 DIAGNOSIS — M62838 Other muscle spasm: Secondary | ICD-10-CM

## 2017-05-23 MED ORDER — METHYLPREDNISOLONE 4 MG PO TBPK: ORAL_TABLET | ORAL | 0 refills | Status: DC

## 2017-05-23 MED ORDER — KETOROLAC TROMETHAMINE 60 MG/2ML IM SOLN
60.0000 mg | Freq: Once | INTRAMUSCULAR | Status: AC
  Administered 2017-05-23: 60 mg via INTRAMUSCULAR

## 2017-05-23 NOTE — Progress Notes (Signed)
BP 104/66   Pulse 81   Temp 98.2 F (36.8 C)   Wt 198 lb (89.8 kg)   LMP  (LMP Unknown)   SpO2 97%   BMI 32.45 kg/m    Subjective:    Patient ID: Kim Thomas, female    DOB: Jul 23, 1956, 61 y.o.   MRN: [id number]  HPI: Kim Thomas is a 61 y.o. female  Chief Complaint  Patient presents with  . Shoulder Pain    pt states she feels like she has a pinched nerve coming from her spine, states she was seen a week ago for this and prednisone did not help. Patient states that she pain is the worst in the morning    Pt with complaints of pain left shoulder that radiates down to her left wrist.  Started on the 16th the day after picking up a case of water.  No numbness of left hand but decreased strength.  Describes a throbbing pain. Came in on the 20th and received Toradol, prednisone, and Flexeril.   She has also tried ice and heat.    Relevant past medical, surgical, family and social history reviewed and updated as indicated. Interim medical history since our last visit reviewed. Allergies and medications reviewed and updated.  Review of Systems  Per HPI unless specifically indicated above     Objective:    BP 104/66   Pulse 81   Temp 98.2 F (36.8 C)   Wt 198 lb (89.8 kg)   LMP  (LMP Unknown)   SpO2 97%   BMI 32.45 kg/m   Wt Readings from Last 3 Encounters:  05/23/17 198 lb (89.8 kg)  05/17/17 196 lb (88.9 kg)  03/27/17 194 lb 6.4 oz (88.2 kg)    Physical Exam  Constitutional: She is oriented to person, place, and time. She appears well-developed and well-nourished. No distress.  HENT:  Head: Normocephalic and atraumatic.  Eyes: Conjunctivae and lids are normal. Right eye exhibits no discharge. Left eye exhibits no discharge. No scleral icterus.  Neck: Normal range of motion. Neck supple. No JVD present. Carotid bruit is not present.  Cardiovascular: Normal rate, regular rhythm and normal heart sounds.   Pulmonary/Chest: Effort normal and breath sounds normal.    Abdominal: Normal appearance. There is no splenomegaly or hepatomegaly.  Musculoskeletal: Normal range of motion.  Severe spasm left shoulder.  Unable to rotate neck to left.    Neurological: She is alert and oriented to person, place, and time. She has normal strength. No cranial nerve deficit or sensory deficit.  Skin: Skin is warm, dry and intact. No rash noted. No pallor.  Psychiatric: She has a normal mood and affect. Her behavior is normal. Judgment and thought content normal.    Results for orders placed or performed in visit on 03/28/17  HM DIABETES FOOT EXAM  Result Value Ref Range   HM Diabetic Foot Exam bilateral- normal       Assessment & Plan:   Problem List Items Addressed This Visit    None    Visit Diagnoses    Spasm of cervical paraspinous muscle    -  Primary   Toradol in the office which previouslty helped.  Medrol dose pack. Restart Tramadol.  PT tomorrow.  Script written.  Pt states she will have this done at work.   Relevant Medications   ketorolac (TORADOL) injection 60 mg (Completed) (Start on 05/23/2017  4:00 PM)       Follow up plan: Return if symptoms  worsen or fail to improve.

## 2017-05-23 NOTE — Patient Instructions (Addendum)
Muscle Cramps and Spasms Muscle cramps and spasms occur when a muscle or muscles tighten and you have no control over this tightening (involuntary muscle contraction). They are a common problem and can develop in any muscle. The most common place is in the calf muscles of the leg. Muscle cramps and muscle spasms are both involuntary muscle contractions, but there are some differences between the two:  Muscle cramps are painful. They come and go and may last a few seconds to 15 minutes. Muscle cramps are often more forceful and last longer than muscle spasms.  Muscle spasms may or may not be painful. They may also last just a few seconds or much longer.  Certain medical conditions, such as diabetes or Parkinson disease, can make it more likely to develop cramps or spasms. However, cramps or spasms are usually not caused by a serious underlying problem. Common causes include:  Overexertion.  Overuse from repetitive motions, or doing the same thing over and over.  Remaining in a certain position for a long period of time.  Improper preparation, form, or technique while playing a sport or doing an activity.  Dehydration.  Injury.  Side effects of some medicines.  Abnormally low levels of the salts and ions in your blood (electrolytes), especially potassium and calcium. This could happen if you are taking water pills (diuretics) or if you are pregnant.  In many cases, the cause of muscle cramps or spasms is unknown. Follow these instructions at home:  Stay well hydrated. Drink enough fluid to keep your urine clear or pale yellow.  Try massaging, stretching, and relaxing the affected muscle.  If directed, apply heat to tight or tense muscles as often as told by your health care provider. Use the heat source that your health care provider recommends, such as a moist heat pack or a heating pad. ? Place a towel between your skin and the heat source. ? Leave the heat on for 20-30  minutes. ? Remove the heat if your skin turns bright red. This is especially important if you are unable to feel pain, heat, or cold. You may have a greater risk of getting burned.  If directed, put ice on the affected area. This may help if you are sore or have pain after a cramp or spasm. ? Put ice in a plastic bag. ? Place a towel between your skin and the bag. ? Leavethe ice on for 20 minutes, 2-3 times a day.  Take over-the-counter and prescription medicines only as told by your health care provider.  Pay attention to any changes in your symptoms. Contact a health care provider if:  Your cramps or spasms get more severe or happen more often.  Your cramps or spasms do not improve over time. This information is not intended to replace advice given to you by your health care provider. Make sure you discuss any questions you have with your health care provider. Document Released: 05/06/2002 Document Revised: 12/16/2015 Document Reviewed: 08/18/2015 Elsevier Interactive Patient Education  2018 [employer]  

## 2017-05-24 ENCOUNTER — Telehealth: Payer: Self-pay | Admitting: Unknown Physician Specialty

## 2017-05-24 ENCOUNTER — Ambulatory Visit: Payer: Medicare Other | Attending: Nurse Practitioner

## 2017-05-24 DIAGNOSIS — M25512 Pain in left shoulder: Secondary | ICD-10-CM

## 2017-05-24 DIAGNOSIS — M542 Cervicalgia: Secondary | ICD-10-CM | POA: Diagnosis not present

## 2017-05-24 DIAGNOSIS — M79602 Pain in left arm: Secondary | ICD-10-CM | POA: Insufficient documentation

## 2017-05-24 NOTE — Therapy (Signed)
[facility] [address] [place], [address] Phone: [phone]   Fax:  [phone]  Physical Therapy Evaluation  Patient Details  Name: Kim Thomas MRN: [id number] Date of Birth: 01-21-1956 Referring Provider: Gabriel Cirri   Encounter Date: 05/24/2017      PT End of Session - 05/24/17 1447    Visit Number 1   Number of Visits 13   Date for PT Re-Evaluation 07/05/17   Authorization Type 1 / 10 G Code   PT Start Time 1345   PT Stop Time 1445   PT Time Calculation (min) 60 min   Activity Tolerance Patient tolerated treatment well   Behavior During Therapy [facility] for tasks assessed/performed      Past Medical History:  Diagnosis Date  . Anxiety   . Chronic kidney disease   . DDD (degenerative disc disease), cervical   . Diabetes mellitus without complication (HCC)   . Gout   . Hypertension   . Sleep apnea     Past Surgical History:  Procedure Laterality Date  . BREAST BIOPSY Left    benign  . BREAST CYST EXCISION Left   . c section     x2  . CHOLECYSTECTOMY    . FOOT SURGERY Bilateral   . KNEE ARTHROSCOPY Left 07/18/2016   Procedure: ARTHROSCOPY KNEE;  Surgeon: Donato Heinz, MD;  Location: [facility] ORS;  Service: Orthopedics;  Laterality: Left;  . KNEE ARTHROSCOPY WITH LATERAL MENISECTOMY Left 07/18/2016   Procedure: KNEE ARTHROSCOPY WITH PARTIAL  LATERAL MENISECTOMY, CHONDROPLASTY LATERAL FEMORAL CONDYLE;  Surgeon: Donato Heinz, MD;  Location: [facility] ORS;  Service: Orthopedics;  Laterality: Left;  . SPINE SURGERY  11/09/11   C5  . TUBAL LIGATION      There were no vitals filed for this visit.       Subjective Assessment - 05/24/17 1401    Subjective Patient reports increased pain along her scapular, UT, and radiating pain down the L arm. Patient reports left sided cervical pain. Patient states increase in pain with lifting her L UE and bending forward. Patient reports decrease pain with  lying on her back in supine. Patient reports her pain is worse in the morning.  Patient reports her pain is very severe. Denies N/T.    Pertinent History PMH: previous LBP, ACDF C6-7, HTN, diabetes, stage III renal failue   Limitations Lifting;Other (comment)  bending   Patient Stated Goals To decrease pain   Currently in Pain? Yes   Pain Score 9   worse: 10/10; best: 9/10   Pain Location Neck  arm   Pain Orientation Left   Pain Descriptors / Indicators Aching   Pain Type Acute pain   Pain Radiating Towards Down L UE   Pain Onset 1 to 4 weeks ago   Pain Frequency Constant            [facility] PT Assessment - 05/24/17 1357      Assessment   Medical Diagnosis Cervical spine spasms MD   Referring Provider Gabriel Cirri    Onset Date/Surgical Date 05/12/17   Hand Dominance Right   Next MD Visit unknown    Prior Therapy Yes for LBP     Balance Screen   Has the patient fallen in the past 6 months Yes   How many times? 4   Has the patient had a decrease in activity level because of a fear of falling?  No   Is the  patient reluctant to leave their home because of a fear of falling?  No     Home Environment   Living Environment Private residence   Living Arrangements Alone   Available Help at Discharge Family   Type of Home Apartment   Home Access Stairs to enter   Entrance Stairs-Number of Steps 4   Entrance Stairs-Rails Can reach both   Home Layout One level   Home Equipment Tub bench;Grab bars - toilet;Grab bars - tub/shower;Toilet riser;Walker - 2 wheels     Prior Function   Level of Independence Independent   Vocation On disability   Vocation Requirements N/A`   Leisure [employer], walking, sight-seeing     Cognition   Overall Cognitive Status Within Functional Limits for tasks assessed     Observation/Other Assessments   Observations Decreased scapular retraction motor control    Other Surveys  Other Surveys   Neck Disability Index  56%     Sensation   Light Touch  Impaired Detail   Light Touch Impaired Details Impaired LUE  Decreased Sensation: C4, 6, 7, T1     Posture/Postural Control   Posture Comments Increased FHP, forward rounded shoulders, R Lateral side bend     ROM / Strength   AROM / PROM / Strength AROM;Strength     AROM   AROM Assessment Site Cervical;Shoulder;Elbow;Wrist   Right/Left Shoulder Right;Left  R: WNL   Left Shoulder Flexion 100 Degrees  increase pain   Left Shoulder ABduction 90 Degrees  increased pain   Right/Left Elbow Left  R: WNL   Left Elbow Flexion 110  pain   Left Elbow Extension 0   Right/Left Wrist --  R: WNL   Left Wrist Extension 70 Degrees   Left Wrist Flexion 20 Degrees   Cervical Flexion 25   Cervical Extension 25  pain   Cervical - Right Side Bend 25  pain   Cervical - Left Side Bend 5  pain   Cervical - Right Rotation 40  pain   Cervical - Left Rotation 10  pain     Strength   Strength Assessment Site Cervical;Shoulder;Elbow;Wrist   Right/Left Shoulder Left  Right: WNL   Left Shoulder Flexion 3+/5  pain   Left Shoulder ABduction 3+/5  pain   Right/Left Elbow Right;Left   Right Elbow Flexion 5/5   Right Elbow Extension 5/5   Left Elbow Flexion 3+/5  pain   Left Elbow Extension 4-/5  pain   Right/Left Wrist Left;Right  No pain with R   Left Wrist Flexion 3+/5   Left Wrist Extension 3+/5   Right Hand Grip (lbs) WNL   Left Hand Grip (lbs) Diminished   Cervical Flexion 4-/5   Cervical Extension 4-/5   Cervical - Right Side Bend 4-/5   Cervical - Left Side Bend 4-/5     Palpation   Palpation comment Patient demonstrates TTP along UT, periscapular musculature, pain along central c5-t3     Special Tests    Special Tests Cervical   Cervical Tests Spurling's     Spurling's   Findings Positive   Side Left   Comment Increased pain and radiating symptoms      Objective measurements completed on examination: See above findings.    Evaluation: Improvement in symptoms  after performing cervical isometrics retractions and lateral flexion in supine -- x 10; performed same motions in standing x10 which abolished her pain radiating down her arm.   Therapeutic Exercise Isometric cervical retraction; lateral bending --  x 10 in standing and supine   Patient demonstrates abolishment of symptoms after performing cervical isometrics.          PT Education - Jun 09, 2017 1446    Education provided Yes   Education Details HEP: cervical isometrics, isometric lateral flexion   Person(s) Educated Patient   Methods Explanation;Demonstration   Comprehension Verbalized understanding;Returned demonstration             PT Long Term Goals - 06-09-2017 1454      PT LONG TERM GOAL #1   Title Patient will be independent with HEP to continue benefits of therapy after discharge   Baseline Dependent for exercise performance and progression   Time 6   Period Weeks   Status New     PT LONG TERM GOAL #2   Title Patient will demonstrate improvement in L cervical rotation to equal the R side to allow for ease of performance of driving.    Baseline 5deg of L cerv. rotation   Time 6   Period Weeks   Status New     PT LONG TERM GOAL #3   Title Patient will improve NDI to under 20% to improve perceived cervical function and greater ability to perform dressing/washing activities.    Baseline 54% NDI   Time 6   Period Weeks   Status New                Plan - 06/09/17 1447    Clinical Impression Statement Patient is a right hand dominant female presenting with increased L sided neck pain with radiating pain down her L UE. Patient demonstrates cervical dysfunction as indicated by high NDI scores and pain with all motions (except flexion) limiting ability to perform functional tasks such as cooking and reaching overhead. Patient demonstrates decrease in pain with isometric lateral flexion and retraction B indicating improvement in motor control and decreased guarding.  Patient will benefit from further skilled therapy focused on improving current limitations to return to prior level of function.    History and Personal Factors relevant to plan of care: Hx of diabetes, HTN, LBP   Clinical Presentation Unstable   Clinical Presentation due to: Increased muscle guarding and cervical dysfunction; no clear cause of pain   Clinical Decision Making Moderate   Rehab Potential Good   Clinical Impairments Affecting Rehab Potential (-) lives alone, diabetes (+) highly motivated, family support   PT Frequency 2x / week   PT Duration 6 weeks   PT Treatment/Interventions Aquatic Therapy;Moist Heat;Iontophoresis 4mg /ml Dexamethasone;Biofeedback;Cryotherapy;Ultrasound;Therapeutic exercise;Therapeutic activities;Functional mobility training;Gait training;Stair training;Neuromuscular re-education;Patient/family education;Balance training;Taping;Manual techniques;Dry needling;Electrical Stimulation   PT Next Visit Plan Progress strengthening and motor coordination   PT Home Exercise Plan See education   Consulted and Agree with Plan of Care Patient      Patient will benefit from skilled therapeutic intervention in order to improve the following deficits and impairments:  Pain, Hypomobility, Decreased balance, Decreased mobility, Impaired sensation, Decreased coordination, Increased muscle spasms, Hypermobility, Decreased endurance, Decreased activity tolerance, Decreased range of motion  Visit Diagnosis: Cervicalgia - Plan: PT plan of care cert/re-cert  Pain in left arm - Plan: PT plan of care cert/re-cert  Acute pain of left shoulder - Plan: PT plan of care cert/re-cert      G-Codes - 06-09-2017 1458    Functional Assessment Tool Used (Outpatient Only) NDI, cervical AROM, MMT, Spurlings test, clinical judgement   Functional Limitation Mobility: Walking and moving around   Mobility: Walking and Moving Around Current Status (  [id number]) At least 40 percent but less than 60  percent impaired, limited or restricted   Mobility: Walking and Moving Around Goal Status [phone]([id number]) At least 1 percent but less than 20 percent impaired, limited or restricted       Problem List Patient Active Problem List   Diagnosis Date Noted  . Advanced care planning/counseling discussion 02/20/2017  . Risk for falls 02/20/2017  . Pain management 02/20/2017  . Insomnia 06/21/2016  . Elevated uric acid in blood 06/21/2016  . Recurrent boils 06/21/2016  . Pain in left knee 06/06/2016  . Allergic rhinitis 06/06/2016  . Abscess of vulva 06/06/2016  . Low back pain 06/06/2016  . Major depressive disorder, recurrent episode, moderate (HCC) 06/06/2016  . CKD (chronic kidney disease), stage III 06/06/2016  . Diabetes (HCC) 05/25/2016  . Hypertension 05/25/2016  . RLS (restless legs syndrome) 05/25/2016  . Arthritis of hand, right 05/25/2016  . Sleep apnea 05/25/2016  . Prominent metatarsal head 01/01/2016  . Hammertoe 01/01/2016  . Numbness and tingling 09/08/2015  . 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    Myrene GalasWesley Marshun Duva, PT DPT 05/24/2017, 3:14 PM  [place] [facility] [address] , [place], [address] Phone: [phone]   Fax:  [phone]  Name: Elyn Aquasngel Kulig MRN: [id number] Date of Birth: 02-12-1956

## 2017-05-24 NOTE — Telephone Encounter (Signed)
Called and apologized to the patient for the medication being sent to the wrong pharmacy. [facility]

## 2017-05-24 NOTE — Telephone Encounter (Signed)
Called Medrol Dosepak into [facility]. Elnita MaxwellCheryl verbally stated that this is the only medication that was supposed to be sent in for the patient. Will call and let her know.

## 2017-05-24 NOTE — Telephone Encounter (Signed)
Called and cancelled prescription with [facility] for the patient. Elnita MaxwellCheryl, do you mind resending it to a local pharmacy because they are not open yet.? Was the patient supposed to be prescribed the Ketorolac as well as the medrol dosepak? Pharmacy is [facility].

## 2017-05-29 ENCOUNTER — Ambulatory Visit: Payer: Medicare Other | Attending: Nurse Practitioner | Admitting: Physical Therapy

## 2017-05-29 DIAGNOSIS — M79602 Pain in left arm: Secondary | ICD-10-CM | POA: Diagnosis present

## 2017-05-29 DIAGNOSIS — M542 Cervicalgia: Secondary | ICD-10-CM | POA: Diagnosis not present

## 2017-05-29 DIAGNOSIS — M25512 Pain in left shoulder: Secondary | ICD-10-CM | POA: Diagnosis present

## 2017-05-29 NOTE — Therapy (Signed)
[place] [facility] [address] [place], [address] Phone: [phone]   Fax:  [phone]  Physical Therapy Treatment  Patient Details  Name: Kim Thomas MRN: [id number] Date of Birth: 1956/09/01 Referring Provider: Gabriel Cirri   Encounter Date: 05/29/2017      PT End of Session - 05/29/17 1306    Visit Number 2   Number of Visits 13   Date for PT Re-Evaluation 07/05/17   Authorization Type 2/ 10 G Code   PT Start Time 1305   PT Stop Time 1346   PT Time Calculation (min) 41 min   Activity Tolerance Patient tolerated treatment well   Behavior During Therapy [facility] for tasks assessed/performed      Past Medical History:  Diagnosis Date  . Anxiety   . Chronic kidney disease   . DDD (degenerative disc disease), cervical   . Diabetes mellitus without complication (HCC)   . Gout   . Hypertension   . Sleep apnea     Past Surgical History:  Procedure Laterality Date  . BREAST BIOPSY Left    benign  . BREAST CYST EXCISION Left   . c section     x2  . CHOLECYSTECTOMY    . FOOT SURGERY Bilateral   . KNEE ARTHROSCOPY Left 07/18/2016   Procedure: ARTHROSCOPY KNEE;  Surgeon: Donato Heinz, MD;  Location: [facility] ORS;  Service: Orthopedics;  Laterality: Left;  . KNEE ARTHROSCOPY WITH LATERAL MENISECTOMY Left 07/18/2016   Procedure: KNEE ARTHROSCOPY WITH PARTIAL  LATERAL MENISECTOMY, CHONDROPLASTY LATERAL FEMORAL CONDYLE;  Surgeon: Donato Heinz, MD;  Location: [facility] OR[facility];  Service: Orthopedics;  Laterality: Left;  . SPINE SURGERY  11/09/11   C5  . TUBAL LIGATION      There were no vitals filed for this visit.      Subjective Assessment - 05/29/17 1308    Subjective Pt reports overall she is feeling less pain but when she performs her cervical sidebending isometric exercises it causes a cramping in LUE.  Pt reports tightness in left side of mid back region.     Pertinent History PMH: previous LBP, ACDF C6-7,  HTN, diabetes, stage III renal failue   Limitations Lifting;Other (comment)  bending   Patient Stated Goals To decrease pain   Currently in Pain? Yes   Pain Score 8    Pain Location Arm   Pain Orientation Left   Pain Descriptors / Indicators Aching   Pain Onset 1 to 4 weeks ago   Multiple Pain Sites No       TREATMENT   Therapeutic Exercise:  Supine isometric cervical retraction x5 with 5 second holds. Pt reports pain in L forearm when completing this exercise. Pt instructed to not push with as much force and pt reports decrease in L forearm pain.   Isometric lateral bending -- x 10 with 5 second holds each side in supine. Pt reports pain in LUE when performing isometric lateral bending to the L with LUE resisting. Instead had pt provide resistance with RUE and pt reports no issues with this. Instructed pt to perform with this technique at home.   Supine scapular retraction with 5 second holds x10. Pt reports some pain in L forearm initially. Rested LUE on towel roll and pt reports relief of pain. Instructed pt to perform all supine exercise with towel roll or pillow under LUE to provide support. (added to HEP)   Seated L UT stretch with cues and  demonstration for proper technique and for gentle stretch. x3 with 15 second holds (added to HEP)   Pt instructed to decrease intensity of exercise or discontinue exercise if necessary at home if exercise produces more than mild pain.    Manual Therapy:  STM L UT as increased muscular tension appreciated, reproduces pain in L forearm and relieves following. X8 minutes             PT Education - 05/29/17 1306    Education provided Yes   Education Details Exercise technique; instruction on when to discontinue exercise; emphasized gentle stretch with L UT stretch   Person(s) Educated Patient   Methods Explanation;Demonstration;Verbal cues;Tactile cues   Comprehension Verbalized understanding;Returned demonstration;Verbal cues  required;Tactile cues required;Need further instruction             PT Long Term Goals - 05/24/17 1454      PT LONG TERM GOAL #1   Title Patient will be independent with HEP to continue benefits of therapy after discharge   Baseline Dependent for exercise performance and progression   Time 6   Period Weeks   Status New     PT LONG TERM GOAL #2   Title Patient will demonstrate improvement in L cervical rotation to equal the R side to allow for ease of performance of driving.    Baseline 5deg of L cerv. rotation   Time 6   Period Weeks   Status New     PT LONG TERM GOAL #3   Title Patient will improve NDI to under 20% to improve perceived cervical function and greater ability to perform dressing/washing activities.    Baseline 54% NDI   Time 6   Period Weeks   Status New               Plan - 05/29/17 1519    Clinical Impression Statement Pt demonstrated cervical isometrics as part of HEP and reported pain in LUE with L cervical sidebending with LUE providing resistance.  Instructed pt to use RUE to provide resistance which completely relieved pain in LUE.  Noted increased muscular tension and trigger points in L UT and with STM to this region pt reported reproduction of pain in LUE, symptoms resolving once STM complete. She was instructed in gentle L UT stretch to address this increased tension.  She presents with poor posture with rounded shoulders and thoracic kyphosis so scapular squeezes were introduced to initiate addressing this posture.  She was instructed to perform exercises in no pain>mild pain range and to decrease intensity or discontinue exercise if her pain exceeds this level.  Pt will benefit from continued skilled PT interventions for improved posture, decreased pain, and improved QOL.   Rehab Potential Good   Clinical Impairments Affecting Rehab Potential (-) lives alone, diabetes (+) highly motivated, family support   PT Frequency 2x / week   PT Duration  6 weeks   PT Treatment/Interventions Aquatic Therapy;Moist Heat;Iontophoresis 4mg /ml Dexamethasone;Biofeedback;Cryotherapy;Ultrasound;Therapeutic exercise;Therapeutic activities;Functional mobility training;Gait training;Stair training;Neuromuscular re-education;Patient/family education;Balance training;Taping;Manual techniques;Dry needling;Electrical Stimulation   PT Next Visit Plan Progress strengthening and motor coordination   PT Home Exercise Plan See education   Consulted and Agree with Plan of Care Patient      Patient will benefit from skilled therapeutic intervention in order to improve the following deficits and impairments:  Pain, Hypomobility, Decreased balance, Decreased mobility, Impaired sensation, Decreased coordination, Increased muscle spasms, Hypermobility, Decreased endurance, Decreased activity tolerance, Decreased range of motion  Visit Diagnosis: Cervicalgia  Pain  in left arm  Acute pain of left shoulder     Problem List Patient Active Problem List   Diagnosis Date Noted  . Advanced care planning/counseling discussion 02/20/2017  . Risk for falls 02/20/2017  . Pain management 02/20/2017  . Insomnia 06/21/2016  . Elevated uric acid in blood 06/21/2016  . Recurrent boils 06/21/2016  . Pain in left knee 06/06/2016  . Allergic rhinitis 06/06/2016  . Abscess of vulva 06/06/2016  . Low back pain 06/06/2016  . Major depressive disorder, recurrent episode, moderate (HCC) 06/06/2016  . CKD (chronic kidney disease), stage III 06/06/2016  . Diabetes (HCC) 05/25/2016  . Hypertension 05/25/2016  . RLS (restless legs syndrome) 05/25/2016  . Arthritis of hand, right 05/25/2016  . Sleep apnea 05/25/2016  . Prominent metatarsal head 01/01/2016  . Hammertoe 01/01/2016  . Numbness and tingling 09/08/2015  . 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 [address] [place], [place], [address] Phone: [phone]   Fax:  [phone]  Name: Elyn Aquasngel Boehle MRN: [id number] Date of Birth: [birth year]

## 2017-05-30 ENCOUNTER — Telehealth: Payer: Self-pay | Admitting: Unknown Physician Specialty

## 2017-05-30 NOTE — Telephone Encounter (Signed)
Called pt to re-schedule Annual Wellness Visit with [facility] ON 7/5 - knb

## 2017-06-01 ENCOUNTER — Encounter: Payer: Self-pay | Admitting: Physical Therapy

## 2017-06-01 ENCOUNTER — Ambulatory Visit: Payer: Medicare Other

## 2017-06-01 ENCOUNTER — Ambulatory Visit: Payer: Medicare Other | Admitting: Physical Therapy

## 2017-06-01 DIAGNOSIS — M542 Cervicalgia: Secondary | ICD-10-CM

## 2017-06-01 DIAGNOSIS — M25512 Pain in left shoulder: Secondary | ICD-10-CM

## 2017-06-01 DIAGNOSIS — M79602 Pain in left arm: Secondary | ICD-10-CM

## 2017-06-01 NOTE — Therapy (Signed)
[place] [facility] PHYSICAL AND SPORTS MEDICINE [address], [place], [address] Phone: [phone]   Fax:  [phone]  Physical Therapy Treatment  Patient Details  Name: Kim Thomas MRN: [id number] Date of Birth: Apr 22, 1956 Referring Provider: Gabriel Cirri   Encounter Date: 06/01/2017      PT End of Session - 06/01/17 1305    Visit Number 3   Number of Visits 13   Date for PT Re-Evaluation 07/05/17   Authorization Type 3/ 10 G Code   PT Start Time 1304   PT Stop Time 1346   PT Time Calculation (min) 42 min   Activity Tolerance Patient tolerated treatment well   Behavior During Therapy [facility] for tasks assessed/performed      Past Medical History:  Diagnosis Date  . Anxiety   . Chronic kidney disease   . DDD (degenerative disc disease), cervical   . Diabetes mellitus without complication (HCC)   . Gout   . Hypertension   . Sleep apnea     Past Surgical History:  Procedure Laterality Date  . BREAST BIOPSY Left    benign  . BREAST CYST EXCISION Left   . c section     x2  . CHOLECYSTECTOMY    . FOOT SURGERY Bilateral   . KNEE ARTHROSCOPY Left 07/18/2016   Procedure: ARTHROSCOPY KNEE;  Surgeon: Donato Heinz, MD;  Location: [facility] ORS;  Service: Orthopedics;  Laterality: Left;  . KNEE ARTHROSCOPY WITH LATERAL MENISECTOMY Left 07/18/2016   Procedure: KNEE ARTHROSCOPY WITH PARTIAL  LATERAL MENISECTOMY, CHONDROPLASTY LATERAL FEMORAL CONDYLE;  Surgeon: Donato Heinz, MD;  Location: [facility] OR[facility];  Service: Orthopedics;  Laterality: Left;  . SPINE SURGERY  11/09/11   C5  . TUBAL LIGATION      There were no vitals filed for this visit.      Subjective Assessment - 06/01/17 1309    Subjective Pt reports she is not feeling pain in her L wrist region as much anymore but is having pain in her elbow region.  She is having pain anytime she looks to her L or turns to her L.  Says she responded well to therapy after last session but yesterday  her pain worsened without explanation.  Pt has been attempting to perform HEP supine exercises in sitting despite instruction on HEP handout to perform in supine.     Pertinent History PMH: previous LBP, ACDF C6-7, HTN, diabetes, stage III renal failue   Limitations Lifting;Other (comment)  bending   Patient Stated Goals To decrease pain   Currently in Pain? Yes   Pain Location Elbow   Pain Orientation Left   Pain Descriptors / Indicators Aching   Pain Onset 1 to 4 weeks ago   Multiple Pain Sites No       TREATMENT   Discussed proper placement of pillows to decrease irritation to cervical spine when sleeping   Extensive education provided on the importance of performing her HEP with the technique described in her handouts. Pt verbalized understanding. Additionally, extensive education provided on the relationship between increased muscular tension and referred pain with pt verbalizing understanding.    Therapeutic Exercise:  Supine isometric cervical retraction x5 with 5 second holds.   Supine isometric cervical sidebend with 5 second holds x5 each direction   Following PROM, Supine AAROM cervical rotation Bil with no increased pain x10 each direction   Supine AROM cervical rotation Bil with no increased pain x10 each direction   Supine scapular  retraction with 5 second holds x10   Seated L UT stretch with cues and demonstration for proper technique and for gentle stretch. x6 with 5 second holds   Pt denies any increase in pain with therapeutic exercises.     Manual Therapy:  STM L UT as increased muscular tension appreciated, reproduces pain in L elbow region and relieves following. X6 minutes   Grade II-III CPA T2-8, 2x30 seconds each segment, pt reports "this feels good"   PROM cervical rotation x10 each side (prior to AAROM and AROM) with no increase in pain   Contract relax L UT with 5 second holds x10 in supine            PT Education - 06/01/17 1304     Education provided Yes   Education Details Exercise technique; importance of following specific instructions on HEP handout; the relationship between tight musculature and referred pain   Person(s) Educated Patient   Methods Explanation;Demonstration;Verbal cues   Comprehension Verbalized understanding;Returned demonstration;Verbal cues required;Need further instruction             PT Long Term Goals - 05/24/17 1454      PT LONG TERM GOAL #1   Title Patient will be independent with HEP to continue benefits of therapy after discharge   Baseline Dependent for exercise performance and progression   Time 6   Period Weeks   Status New     PT LONG TERM GOAL #2   Title Patient will demonstrate improvement in L cervical rotation to equal the R side to allow for ease of performance of driving.    Baseline 5deg of L cerv. rotation   Time 6   Period Weeks   Status New     PT LONG TERM GOAL #3   Title Patient will improve NDI to under 20% to improve perceived cervical function and greater ability to perform dressing/washing activities.    Baseline 54% NDI   Time 6   Period Weeks   Status New               Plan - 06/01/17 1445    Clinical Impression Statement Pt requires cues to remain focused on task as pt appears anxious about and focused on her pain.  Suspect that pt's increased pain experienced yesterday was due to failure to correctly follow HEP instructions properly, pt was instructed on the importance of following instructions as given.  Pt was unable to perform L cervical rotation at the start of session today due to pain.  Following PROM and AAROM into L cervical rotation the pt was able to perform L cervical AROM without increased pain.  She continues to present with increased muscular tension in L UT region with pain referring to L wrist and elbow and responded positively to STM to L UT.  Reviewed L UT stretch as part of HEP as pt was performing on the R in addition to the  L, likely aggravating L [place] musculature.  Pt will benefit from continued skilled PT interventions for improved ROM, posture, decreased pain, and improved QOL.   Rehab Potential Good   Clinical Impairments Affecting Rehab Potential (-) lives alone, diabetes (+) highly motivated, family support   PT Frequency 2x / week   PT Duration 6 weeks   PT Treatment/Interventions Aquatic Therapy;Moist Heat;Iontophoresis 4mg /ml Dexamethasone;Biofeedback;Cryotherapy;Ultrasound;Therapeutic exercise;Therapeutic activities;Functional mobility training;Gait training;Stair training;Neuromuscular re-education;Patient/family education;Balance training;Taping;Manual techniques;Dry needling;Electrical Stimulation   PT Next Visit Plan Progress strengthening and motor coordination   PT Home Exercise  Plan See education   Consulted and Agree with Plan of Care Patient      Patient will benefit from skilled therapeutic intervention in order to improve the following deficits and impairments:  Pain, Hypomobility, Decreased balance, Decreased mobility, Impaired sensation, Decreased coordination, Increased muscle spasms, Hypermobility, Decreased endurance, Decreased activity tolerance, Decreased range of motion  Visit Diagnosis: Cervicalgia  Pain in left arm  Acute pain of left shoulder     Problem List Patient Active Problem List   Diagnosis Date Noted  . Advanced care planning/counseling discussion 02/20/2017  . Risk for falls 02/20/2017  . Pain management 02/20/2017  . Insomnia 06/21/2016  . Elevated uric acid in blood 06/21/2016  . Recurrent boils 06/21/2016  . Pain in left knee 06/06/2016  . Allergic rhinitis 06/06/2016  . Abscess of vulva 06/06/2016  . Low back pain 06/06/2016  . Major depressive disorder, recurrent episode, moderate (HCC) 06/06/2016  . CKD (chronic kidney disease), stage III 06/06/2016  . Diabetes (HCC) 05/25/2016  . Hypertension 05/25/2016  . RLS (restless legs syndrome)  05/25/2016  . Arthritis of hand, right 05/25/2016  . Sleep apnea 05/25/2016  . Prominent metatarsal head 01/01/2016  . Hammertoe 01/01/2016  . Numbness and tingling 09/08/2015  . 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 , [place], [address] Phone: [phone]   Fax:  [phone]  Name: Kim Thomas MRN: [id number] Date of Birth: 1956-02-12

## 2017-06-02 ENCOUNTER — Other Ambulatory Visit: Payer: Self-pay | Admitting: Unknown Physician Specialty

## 2017-06-02 ENCOUNTER — Ambulatory Visit: Payer: Medicare Other

## 2017-06-02 VITALS — BP 110/60 | HR 80 | Temp 98.0°F | Ht 65.0 in | Wt 193.4 lb

## 2017-06-02 DIAGNOSIS — Z0000 Encounter for general adult medical examination without abnormal findings: Secondary | ICD-10-CM

## 2017-06-02 DIAGNOSIS — Z114 Encounter for screening for human immunodeficiency virus [HIV]: Secondary | ICD-10-CM

## 2017-06-02 DIAGNOSIS — Z1159 Encounter for screening for other viral diseases: Secondary | ICD-10-CM

## 2017-06-02 MED ORDER — TRAMADOL HCL 50 MG PO TABS: 50.0000 mg | ORAL_TABLET | Freq: Two times a day (BID) | ORAL | 1 refills | Status: DC | PRN

## 2017-06-02 NOTE — Patient Instructions (Signed)
Kim Thomas , Thank you for taking time to come for your Medicare Wellness Visit. I appreciate your ongoing commitment to your health goals. Please review the following plan we discussed and let me know if I can assist you in the future.   Screening recommendations/referrals: Colonoscopy: Completed 11/28/2013 Mammogram: Completed 03/27/2017 Bone Density: Completed 03/08/2017 Recommended yearly ophthalmology/optometry visit for glaucoma screening and checkup Recommended yearly dental visit for hygiene and checkup  Vaccinations: Influenza vaccine: Due 07/2017 Pneumococcal vaccine: Due at age 65 Tdap vaccine: up to date Shingles vaccine: up to date  Advanced directives: Please bring a copy of your health care power of attorney and living will to the office at your convenience.  Conditions/risks identified: Recommend drinking at least 4-5 glasses a day  Next appointment: Follow up on 06/30/2017 at 1:30pm with Cheryl Wicker,NP. Follow up in one year for your annual wellness exam.  Preventive Care 40-64 Years, Female Preventive care refers to lifestyle choices and visits with your health care provider that can promote health and wellness. What does preventive care include?  A yearly physical exam. This is also called an annual well check.  Dental exams once or twice a year.  Routine eye exams. Ask your health care provider how often you should have your eyes checked.  Personal lifestyle choices, including:  Daily care of your teeth and gums.  Regular physical activity.  Eating a healthy diet.  Avoiding tobacco and drug use.  Limiting alcohol use.  Practicing safe sex.  Taking low-dose aspirin daily starting at age 50.  Taking vitamin and mineral supplements as recommended by your health care provider. What happens during an annual well check? The services and screenings done by your health care provider during your annual well check will depend on your age, overall health,  lifestyle risk factors, and family history of disease. Counseling  Your health care provider may ask you questions about your:  Alcohol use.  Tobacco use.  Drug use.  Emotional well-being.  Home and relationship well-being.  Sexual activity.  Eating habits.  Work and work environment.  Method of birth control.  Menstrual cycle.  Pregnancy history. Screening  You may have the following tests or measurements:  Height, weight, and BMI.  Blood pressure.  Lipid and cholesterol levels. These may be checked every 5 years, or more frequently if you are over 50 years old.  Skin check.  Lung cancer screening. You may have this screening every year starting at age 55 if you have a 30-pack-year history of smoking and currently smoke or have quit within the past 15 years.  Fecal occult blood test (FOBT) of the stool. You may have this test every year starting at age 50.  Flexible sigmoidoscopy or colonoscopy. You may have a sigmoidoscopy every 5 years or a colonoscopy every 10 years starting at age 50.  Hepatitis C blood test.  Hepatitis B blood test.  Sexually transmitted disease (STD) testing.  Diabetes screening. This is done by checking your blood sugar (glucose) after you have not eaten for a while (fasting). You may have this done every 1-3 years.  Mammogram. This may be done every 1-2 years. Talk to your health care provider about when you should start having regular mammograms. This may depend on whether you have a family history of breast cancer.  BRCA-related cancer screening. This may be done if you have a family history of breast, ovarian, tubal, or peritoneal cancers.  Pelvic exam and Pap test. This may be done every   3 years starting at age 95. Starting at age 79, this may be done every 5 years if you have a Pap test in combination with an HPV test.  Bone density scan. This is done to screen for osteoporosis. You may have this scan if you are at high risk for  osteoporosis. Discuss your test results, treatment options, and if necessary, the need for more tests with your health care provider. Vaccines  Your health care provider may recommend certain vaccines, such as:  Influenza vaccine. This is recommended every year.  Tetanus, diphtheria, and acellular pertussis (Tdap, Td) vaccine. You may need a Td booster every 10 years.  Zoster vaccine. You may need this after age 38.  Pneumococcal 13-valent conjugate (PCV13) vaccine. You may need this if you have certain conditions and were not previously vaccinated.  Pneumococcal polysaccharide (PPSV23) vaccine. You may need one or two doses if you smoke cigarettes or if you have certain conditions. Talk to your health care provider about which screenings and vaccines you need and how often you need them. This information is not intended to replace advice given to you by your health care provider. Make sure you discuss any questions you have with your health care provider. Document Released: 12/11/2015 Document Revised: 08/03/2016 Document Reviewed: 09/15/2015 Elsevier Interactive Patient Education  2017 [place] Prevention in the Home Falls can cause injuries. They can happen to people of all ages. There are many things you can do to make your home safe and to help prevent falls. What can I do on the outside of my home?  Regularly fix the edges of walkways and driveways and fix any cracks.  Remove anything that might make you trip as you walk through a door, such as a raised step or threshold.  Trim any bushes or trees on the path to your home.  Use bright outdoor lighting.  Clear any walking paths of anything that might make someone trip, such as rocks or tools.  Regularly check to see if handrails are loose or broken. Make sure that both sides of any steps have handrails.  Any raised decks and porches should have guardrails on the edges.  Have any leaves, snow, or ice cleared  regularly.  Use sand or salt on walking paths during winter.  Clean up any spills in your garage right away. This includes oil or grease spills. What can I do in the bathroom?  Use night lights.  Install grab bars by the toilet and in the tub and shower. Do not use towel bars as grab bars.  Use non-skid mats or decals in the tub or shower.  If you need to sit down in the shower, use a plastic, non-slip stool.  Keep the floor dry. Clean up any water that spills on the floor as soon as it happens.  Remove soap buildup in the tub or shower regularly.  Attach bath mats securely with double-sided non-slip rug tape.  Do not have throw rugs and other things on the floor that can make you trip. What can I do in the bedroom?  Use night lights.  Make sure that you have a light by your bed that is easy to reach.  Do not use any sheets or blankets that are too big for your bed. They should not hang down onto the floor.  Have a firm chair that has side arms. You can use this for support while you get dressed.  Do not have throw  rugs and other things on the floor that can make you trip. What can I do in the kitchen?  Clean up any spills right away.  Avoid walking on wet floors.  Keep items that you use a lot in easy-to-reach places.  If you need to reach something above you, use a strong step stool that has a grab bar.  Keep electrical cords out of the way.  Do not use floor polish or wax that makes floors slippery. If you must use wax, use non-skid floor wax.  Do not have throw rugs and other things on the floor that can make you trip. What can I do with my stairs?  Do not leave any items on the stairs.  Make sure that there are handrails on both sides of the stairs and use them. Fix handrails that are broken or loose. Make sure that handrails are as long as the stairways.  Check any carpeting to make sure that it is firmly attached to the stairs. Fix any carpet that is loose  or worn.  Avoid having throw rugs at the top or bottom of the stairs. If you do have throw rugs, attach them to the floor with carpet tape.  Make sure that you have a light switch at the top of the stairs and the bottom of the stairs. If you do not have them, ask someone to add them for you. What else can I do to help prevent falls?  Wear shoes that:  Do not have high heels.  Have rubber bottoms.  Are comfortable and fit you well.  Are closed at the toe. Do not wear sandals.  If you use a stepladder:  Make sure that it is fully opened. Do not climb a closed stepladder.  Make sure that both sides of the stepladder are locked into place.  Ask someone to hold it for you, if possible.  Clearly mark and make sure that you can see:  Any grab bars or handrails.  First and last steps.  Where the edge of each step is.  Use tools that help you move around (mobility aids) if they are needed. These include:  Canes.  Walkers.  Scooters.  Crutches.  Turn on the lights when you go into a dark area. Replace any light bulbs as soon as they burn out.  Set up your furniture so you have a clear path. Avoid moving your furniture around.  If any of your floors are uneven, fix them.  If there are any pets around you, be aware of where they are.  Review your medicines with your doctor. Some medicines can make you feel dizzy. This can increase your chance of falling. Ask your doctor what other things that you can do to help prevent falls. This information is not intended to replace advice given to you by your health care provider. Make sure you discuss any questions you have with your health care provider. Document Released: 09/10/2009 Document Revised: 04/21/2016 Document Reviewed: 12/19/2014 Elsevier Interactive Patient Education  2017 [employer]  

## 2017-06-02 NOTE — Progress Notes (Signed)
Subjective:   Kim Thomas is a 61 y.o. female who presents for Medicare Annual (Subsequent) preventive examination.  Review of Systems:   Cardiac Risk Factors include: diabetes mellitus;hypertension     Objective:     Vitals: BP 110/60 (BP Location: Left Arm, Patient Position: Sitting)   Pulse 80   Temp 98 F (36.7 C)   Ht '5\' 5"'  (1.651 m)   Wt 193 lb 6.4 oz (87.7 kg)   LMP  (LMP Unknown)   BMI 32.18 kg/m   Body mass index is 32.18 kg/m.   Tobacco History  Smoking Status  . Former Smoker  . Quit date: 05/21/2011  Smokeless Tobacco  . Never Used     Counseling given: Not Answered   Past Medical History:  Diagnosis Date  . Anxiety   . Chronic kidney disease   . DDD (degenerative disc disease), cervical   . Diabetes mellitus without complication ([place])   . Gout   . Hypertension   . Sleep apnea    Past Surgical History:  Procedure Laterality Date  . BREAST BIOPSY Left    benign  . BREAST CYST EXCISION Left   . c section     x2  . CHOLECYSTECTOMY    . FOOT SURGERY Bilateral   . KNEE ARTHROSCOPY Left 07/18/2016   Procedure: ARTHROSCOPY KNEE;  Surgeon: Dereck Leep, MD;  Location: [facility] ORS;  Service: Orthopedics;  Laterality: Left;  . KNEE ARTHROSCOPY WITH LATERAL MENISECTOMY Left 07/18/2016   Procedure: KNEE ARTHROSCOPY WITH PARTIAL  LATERAL MENISECTOMY, CHONDROPLASTY LATERAL FEMORAL CONDYLE;  Surgeon: Dereck Leep, MD;  Location: [facility] OR[facility];  Service: Orthopedics;  Laterality: Left;  . SPINE SURGERY  11/09/11   C5  . TUBAL LIGATION     Family History  Problem Relation Age of Onset  . Cancer Mother        breast  . Hypertension Mother   . Hyperlipidemia Mother   . Diabetes Mother   . Glaucoma Mother   . Cataracts Mother   . Breast cancer Mother   . Cancer Father   . Cancer Maternal Grandfather   . Breast cancer Maternal Aunt   . Breast cancer Cousin   . Alcohol abuse Neg Hx    History  Sexual Activity  . Sexual activity: Not Currently     Outpatient Encounter Prescriptions as of 06/02/2017  Medication Sig  . acetaminophen (TYLENOL) 500 MG tablet Take 500 mg by mouth daily as needed.  [name] allopurinol (ZYLOPRIM) 100 MG tablet Take 1 tablet (100 mg total) by mouth daily.  . Ascorbic Acid (VITAMIN C PO) Take 500 mg by mouth daily.   . bisoprolol-hydrochlorothiazide (ZIAC) 2.5-6.25 MG tablet Take 1 tablet by mouth daily.  . Blood Glucose Monitoring Suppl (ONE TOUCH ULTRA MINI) w/Device KIT   . Calcium Carb-Cholecalciferol (CALCIUM 500 + D3) 500-600 MG-UNIT TABS Take 1 tablet by mouth daily.  . Cholecalciferol (VITAMIN D3) 2000 units TABS Take 2,000 Units by mouth daily.  . Chromium 1000 MCG TABS Take 1,000 mcg by mouth daily.  . DULoxetine (CYMBALTA) 20 MG capsule Take 1 capsule (20 mg total) by mouth daily.  . fluticasone (FLONASE) 50 MCG/ACT nasal spray Place 1-2 sprays into both nostrils as needed.   . gabapentin (NEURONTIN) 300 MG capsule Take 300 mg by mouth 4 (four) times daily.  . Ginkgo Biloba 60 MG TABS Take 60 mg by mouth daily.  [name] glucose blood (ONE TOUCH ULTRA TEST) test strip   . Magnesium 250  MG TABS Take 250 mg by mouth daily.  . metFORMIN (GLUCOPHAGE) 500 MG tablet TAKE 1 TABLET BY MOUTH 3  TIMES DAILY  . Multiple Vitamin (MULTIVITAMIN) tablet Take 1 tablet by mouth daily.  Glory Rosebush DELICA LANCETS FINE MISC   . OVER THE COUNTER MEDICATION Take 1 tablet by mouth 2 (two) times daily. Ultimate joint repair  . pyridOXINE (VITAMIN B-6) 100 MG tablet Take 100 mg by mouth daily.  . traMADol (ULTRAM) 50 MG tablet Take 1 tablet (50 mg total) by mouth 3 (three) times daily as needed for moderate pain. (Patient taking differently: Take 50 mg by mouth 2 (two) times daily as needed for moderate pain. )  . triamcinolone cream (KENALOG) 0.1 % Apply 1 application topically 2 (two) times daily.  [name] UNABLE TO FIND Cherry Juice- pt states she takes 1 tsp daily at bedtime  . vitamin B-12 (CYANOCOBALAMIN) 500 MCG tablet Take 500 mcg  by mouth daily.  . methylPREDNISolone (MEDROL DOSEPAK) 4 MG TBPK tablet As directed (Patient not taking: Reported on 06/02/2017)  . [DISCONTINUED] meclizine (ANTIVERT) 12.5 MG tablet Take 1 tablet by mouth 2 (two) times daily as needed.   No facility-administered encounter medications on file as of 06/02/2017.     Activities of Daily Living In your present state of health, do you have any difficulty performing the following activities: 06/02/2017 02/20/2017  Hearing? N N  Vision? N Y  Difficulty concentrating or making decisions? N Y  Walking or climbing stairs? N Y  Dressing or bathing? N Y  Doing errands, shopping? N N  Preparing Food and eating ? N -  Using the Toilet? N -  In the past six months, have you accidently leaked urine? N -  Do you have problems with loss of bowel control? N -  Managing your Medications? N -  Managing your Finances? N -  Housekeeping or managing your Housekeeping? N -  Some recent data might be hidden    Patient Care Team: Kathrine Haddock, NP as PCP - General (Nurse Practitioner) Anabel Bene, MD as Referring Physician (Neurology) Watt Climes, PA as Physician Assistant (Orthopedic Surgery) Dereck Leep, MD (Orthopedic Surgery)    Assessment:     Exercise Activities and Dietary recommendations Current Exercise Habits: Structured exercise class, Time (Minutes): 45, Frequency (Times/Week): 2, Weekly Exercise (Minutes/Week): 90, Intensity: Mild, Exercise limited by: orthopedic condition(s)  Goals    . Increase water intake          Recommend drinking at least 4-5 glasses a day      Fall Risk Fall Risk  06/02/2017 05/17/2017 02/20/2017  Falls in the past year? Yes No Yes  Number falls in past yr: 2 or more - 2 or more  Injury with Fall? No - No  Risk Factor Category  High Fall Risk - -  Follow up Falls prevention discussed - -   Depression Screen PHQ 2/9 Scores 06/02/2017 02/20/2017  PHQ - 2 Score 0 1  PHQ- 9 Score - 1     Cognitive  Function     6CIT Screen 06/02/2017  What Year? 0 points  What month? 0 points  What time? 0 points  Count back from 20 0 points  Months in reverse 0 points  Repeat phrase 0 points  Total Score 0    Immunization History  Administered Date(s) Administered  . Td 04/22/2015  . Zoster Recombinat (Shingrix) 02/23/2017   Screening Tests Health Maintenance  Topic Date Due  .  Hepatitis C Screening  27-Nov-1956  . HIV Screening  01/14/1971  . OPHTHALMOLOGY EXAM  07/23/2016  . PNEUMOCOCCAL POLYSACCHARIDE VACCINE (1) 07/22/2017 (Originally 01/14/1958)  . INFLUENZA VACCINE  06/28/2017  . HEMOGLOBIN A1C  09/26/2017  . URINE MICROALBUMIN  02/20/2018  . FOOT EXAM  03/01/2018  . MAMMOGRAM  03/09/2019  . PAP SMEAR  02/21/2020  . COLONOSCOPY  11/29/2023  . TETANUS/TDAP  04/21/2025      Plan:     I have personally reviewed and addressed the Medicare Annual Wellness questionnaire and have noted the following in the patient's chart:  A. Medical and social history B. Use of alcohol, tobacco or illicit drugs  C. Current medications and supplements D. Functional ability and status E.  Nutritional status F.  Physical activity G. Advance directives H. List of other physicians I.  Hospitalizations, surgeries, and ER visits in previous 12 months J.  [place] such as hearing and vision if needed, cognitive and depression L. Referrals and appointments  In addition, I have reviewed and discussed with patient certain preventive protocols, quality metrics, and best practice recommendations. A written personalized care plan for preventive services as well as general preventive health recommendations were provided to patient.   Signed,  Tyler Aas, LPN Nurse Health Advisor   MD Recommendations: none

## 2017-06-05 ENCOUNTER — Ambulatory Visit: Payer: Medicare Other

## 2017-06-05 DIAGNOSIS — M542 Cervicalgia: Secondary | ICD-10-CM

## 2017-06-05 DIAGNOSIS — M79602 Pain in left arm: Secondary | ICD-10-CM

## 2017-06-05 DIAGNOSIS — M25512 Pain in left shoulder: Secondary | ICD-10-CM

## 2017-06-05 NOTE — Therapy (Signed)
[facility] PHYSICAL AND SPORTS MEDICINE [address] [place], [place], [address] Phone: [phone]   Fax:  [phone]  Physical Therapy Treatment  Patient Details  Name: Kim Thomas MRN: [id number] Date of Birth: Jan 26, 1956 Referring Provider: Gabriel Cirriheryl Wicker   Encounter Date: 06/05/2017      PT End of Session - 06/05/17 1345    Visit Number 4   Number of Visits 13   Date for PT Re-Evaluation 07/05/17   Authorization Type 3/ 10 G Code   PT Start Time 1302   PT Stop Time 1344   PT Time Calculation (min) 42 min   Activity Tolerance Patient tolerated treatment well   Behavior During Therapy [facility] for tasks assessed/performed      Past Medical History:  Diagnosis Date  . Anxiety   . Chronic kidney disease   . DDD (degenerative disc disease), cervical   . Diabetes mellitus without complication (H[facility])   . Gout   . Hypertension   . Sleep apnea     Past Surgical History:  Procedure Laterality Date  . BREAST BIOPSY Left    benign  . BREAST CYST EXCISION Left   . c section     x2  . CHOLECYSTECTOMY    . FOOT SURGERY Bilateral   . KNEE ARTHROSCOPY Left 07/18/2016   Procedure: ARTHROSCOPY KNEE;  Surgeon: Donato HeinzJames P Hooten, MD;  Location: [facility] OR[facility];  Service: Orthopedics;  Laterality: Left;  . KNEE ARTHROSCOPY WITH LATERAL MENISECTOMY Left 07/18/2016   Procedure: KNEE ARTHROSCOPY WITH PARTIAL  LATERAL MENISECTOMY, CHONDROPLASTY LATERAL FEMORAL CONDYLE;  Surgeon: Donato HeinzJames P Hooten, MD;  Location: [facility] ORS;  Service: Orthopedics;  Laterality: Left;  . SPINE SURGERY  11/09/11   C5  . TUBAL LIGATION      There were no vitals filed for this visit.      Subjective Assessment - 06/05/17 1304    Subjective Patient reports she has decreased pain with sitting on soft surfaces versus hard surfaces. Patient reports less throbbing sensation and more pain. Patient reports decreased pain after every time she performs her exercise.    Pertinent History PMH:  previous LBP, ACDF C6-7, HTN, diabetes, stage III renal failue   Limitations Lifting;Other (comment)  bending   Patient Stated Goals To decrease pain   Currently in Pain? Yes   Pain Score 7    Pain Location Arm   Pain Orientation Left   Pain Descriptors / Indicators Aching   Pain Type Chronic pain   Pain Onset 1 to 4 weeks ago      TREATMENT  Therapeutic Exercise:  Seated scapular retraction at OMEGA -- #10 x 20 High Row seated at OMEGA - x 35 # 10 Straight arm push downs at OMEGA - x 25 #15  Seated prayer stretch with white ball - x 10 with 5 sec holds Median nerve flossing in sitting - x 15 Seated Thoracic extension - x 3 attempted stopped secondary to increased pain    Manual Therapy:  STM L UT as increased muscular tension appreciated, reproduces pain in L elbow region and relieves following. x12 minutes    Grade II-III CPA T2-8, 2x30 seconds each segment, pt reports "this feels good" with patient in sitting         PT Education - 06/05/17 1309    Education provided Yes   Education Details form/technique with exercise   Person(s) Educated Patient   Methods Explanation;Demonstration   Comprehension Verbalized understanding;Returned demonstration  PT Long Term Goals - 05/24/17 1454      PT LONG TERM GOAL #1   Title Patient will be independent with HEP to continue benefits of therapy after discharge   Baseline Dependent for exercise performance and progression   Time 6   Period Weeks   Status New     PT LONG TERM GOAL #2   Title Patient will demonstrate improvement in L cervical rotation to equal the R side to allow for ease of performance of driving.    Baseline 5deg of L cerv. rotation   Time 6   Period Weeks   Status New     PT LONG TERM GOAL #3   Title Patient will improve NDI to under 20% to improve perceived cervical function and greater ability to perform dressing/washing activities.    Baseline 54% NDI   Time 6   Period Weeks    Status New               Plan - 06/05/17 1345    Clinical Impression Statement Patient demonstrates decreased pain after performing several repetitions of exercises indicating improvement in motor control. Patient demonstrates ability to perform greater amount of of repetitions throughout a greater range indicating functional improvement. Patient will benefit from further skilled therapy to return to prior level of function.    Rehab Potential Good   Clinical Impairments Affecting Rehab Potential (-) lives alone, diabetes (+) highly motivated, family support   PT Frequency 2x / week   PT Duration 6 weeks   PT Treatment/Interventions Aquatic Therapy;Moist Heat;Iontophoresis 4mg /ml Dexamethasone;Biofeedback;Cryotherapy;Ultrasound;Therapeutic exercise;Therapeutic activities;Functional mobility training;Gait training;Stair training;Neuromuscular re-education;Patient/family education;Balance training;Taping;Manual techniques;Dry needling;Electrical Stimulation   PT Next Visit Plan Progress strengthening and motor coordination   PT Home Exercise Plan See education   Consulted and Agree with Plan of Care Patient      Patient will benefit from skilled therapeutic intervention in order to improve the following deficits and impairments:  Pain, Hypomobility, Decreased balance, Decreased mobility, Impaired sensation, Decreased coordination, Increased muscle spasms, Hypermobility, Decreased endurance, Decreased activity tolerance, Decreased range of motion  Visit Diagnosis: Cervicalgia  Pain in left arm  Acute pain of left shoulder     Problem List Patient Active Problem List   Diagnosis Date Noted  . Advanced care planning/counseling discussion 02/20/2017  . Risk for falls 02/20/2017  . Pain management 02/20/2017  . Insomnia 06/21/2016  . Elevated uric acid in blood 06/21/2016  . Recurrent boils 06/21/2016  . Pain in left knee 06/06/2016  . Allergic rhinitis 06/06/2016  . Abscess  of vulva 06/06/2016  . Low back pain 06/06/2016  . Major depressive disorder, recurrent episode, moderate (HCC) 06/06/2016  . CKD (chronic kidney disease), stage III 06/06/2016  . Diabetes (HCC) 05/25/2016  . Hypertension 05/25/2016  . RLS (restless legs syndrome) 05/25/2016  . Arthritis of hand, right 05/25/2016  . Sleep apnea 05/25/2016  . Prominent metatarsal head 01/01/2016  . Hammertoe 01/01/2016  . Numbness and tingling 09/08/2015  . 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    Myrene Galas, PT DPT 06/05/2017, 1:47 PM  [facility] PHYSICAL AND SPORTS MEDICINE [address] [place], [address] Phone: [phone]   Fax:  [phone]  Name: Ahnyla Mendel MRN: [id number] Date of Birth: February 10, 1956

## 2017-06-07 ENCOUNTER — Ambulatory Visit: Payer: Medicare Other

## 2017-06-07 DIAGNOSIS — M542 Cervicalgia: Secondary | ICD-10-CM

## 2017-06-07 DIAGNOSIS — M25512 Pain in left shoulder: Secondary | ICD-10-CM

## 2017-06-07 DIAGNOSIS — M79602 Pain in left arm: Secondary | ICD-10-CM

## 2017-06-07 NOTE — Therapy (Signed)
[facility]ICINE [address] [place], [place], [address] Phone: [phone]   Fax:  [phone]  Physical Therapy Treatment  Patient Details  Name: Kim Thomas MRN: [id number] Date of Birth: 1956/08/23 Referring Provider: Gabriel Cirriheryl Wicker   Encounter Date: 06/07/2017      PT End of Session - 06/07/17 1430    Visit Number 5   Number of Visits 13   Date for PT Re-Evaluation 07/05/17   Authorization Type 5/ 10 G Code   PT Start Time 1346   PT Stop Time 1430   PT Time Calculation (min) 44 min   Activity Tolerance Patient tolerated treatment well   Behavior During Therapy [facility] tasks assessed/performed      Past Medical History:  Diagnosis Date  . Anxiety   . Chronic kidney disease   . DDD (degenerative disc disease), cervical   . Diabetes mellitus without complication ([facility])   . Gout   . Hypertension   . Sleep apnea     Past Surgical History:  Procedure Laterality Date  . BREAST BIOPSY Left    benign  . BREAST CYST EXCISION Left   . c section     x2  . CHOLECYSTECTOMY    . FOOT SURGERY Bilateral   . KNEE ARTHROSCOPY Left 07/18/2016   Procedure: ARTHROSCOPY KNEE;  Surgeon: Donato HeinzJames P Hooten, MD;  Location: [facility];  Service: Orthopedics;  Laterality: Left;  . KNEE ARTHROSCOPY WITH LATERAL MENISECTOMY Left 07/18/2016   Procedure: KNEE ARTHROSCOPY WITH PARTIAL  LATERAL MENISECTOMY, CHONDROPLASTY LATERAL FEMORAL CONDYLE;  Surgeon: Donato HeinzJames P Hooten, MD;  Location: [facility];  Service: Orthopedics;  Laterality: Left;  . SPINE SURGERY  11/09/11   C5  . TUBAL LIGATION      There were no vitals filed for this visit.      Subjective Assessment - 06/07/17 1355    Subjective Patient reports increased low back and neck pain today. Patient reports increased pain throughout the lo   Pertinent History PMH: previous LBP, ACDF C6-7, HTN, diabetes, stage III renal failue   Limitations Lifting;Other (comment)  bending   Patient Stated Goals To decrease pain   Currently in Pain? Yes   Pain Score 10-Worst pain ever   Pain Location Arm  Back: 10/10   Pain Orientation Left   Pain Descriptors / Indicators Aching   Pain Type Chronic pain   Pain Onset 1 to 4 weeks ago      TREATMENT   Therapeutic Exercise:  Median nerve flossing in sitting - x 15 Seated scapular retraction in standing - x 15 Isometric cervical retraction in standing - x10 5sec holds Lumbar extension -- 3 x 10 Sidebending cervical isometrics - x 15 Scapular retraction in standing with towel - x 15     Manual Therapy:  STM L UT as increased muscular tension appreciated, reproduces pain in L elbow region and relieves following. x368min   Grade I/II CPA T2-8, 3x30 seconds each segment, pt reports "this feels good" with patient in sitting       PT Education - 06/07/17 1429    Education provided Yes   Education Details HEP: lumbar extension; cervical isometrics   Person(s) Educated Patient   Methods Explanation;Demonstration   Comprehension Verbalized understanding;Returned demonstration             PT Long Term Goals - 05/24/17 1454      PT LONG TERM GOAL #1   Title Patient will be independent  with HEP to continue benefits of therapy after discharge   Baseline Dependent for exercise performance and progression   Time 6   Period Weeks   Status New     PT LONG TERM GOAL #2   Title Patient will demonstrate improvement in L cervical rotation to equal the R side to allow for ease of performance of driving.    Baseline 5deg of L cerv. rotation   Time 6   Period Weeks   Status New     PT LONG TERM GOAL #3   Title Patient will improve NDI to under 20% to improve perceived cervical function and greater ability to perform dressing/washing activities.    Baseline 54% NDI   Time 6   Period Weeks   Status New               Plan - 06/07/17 1430    Clinical Impression Statement Patient demonstrates decrease in  cervical and lumbar pain after performing lumbar/cervical extension indicating improvement in motor control. Patient demonstrates improved movement quality with more even weight distribution after manual therapy and patient will benefit from further skilled therapy focused on decreasing pain to allow for exercise performance and ability to perform exercises without pain.    Rehab Potential Good   Clinical Impairments Affecting Rehab Potential (-) lives alone, diabetes (+) highly motivated, family support   PT Frequency 2x / week   PT Duration 6 weeks   PT Treatment/Interventions Aquatic Therapy;Moist Heat;Iontophoresis 4mg /ml Dexamethasone;Biofeedback;Cryotherapy;Ultrasound;Therapeutic exercise;Therapeutic activities;Functional mobility training;Gait training;Stair training;Neuromuscular re-education;Patient/family education;Balance training;Taping;Manual techniques;Dry needling;Electrical Stimulation   PT Next Visit Plan Progress strengthening and motor coordination   PT Home Exercise Plan See education   Consulted and Agree with Plan of Care Patient      Patient will benefit from skilled therapeutic intervention in order to improve the following deficits and impairments:  Pain, Hypomobility, Decreased balance, Decreased mobility, Impaired sensation, Decreased coordination, Increased muscle spasms, Hypermobility, Decreased endurance, Decreased activity tolerance, Decreased range of motion  Visit Diagnosis: Cervicalgia  Pain in left arm  Acute pain of left shoulder     Problem List Patient Active Problem List   Diagnosis Date Noted  . Advanced care planning/counseling discussion 02/20/2017  . Risk for falls 02/20/2017  . Pain management 02/20/2017  . Insomnia 06/21/2016  . Elevated uric acid in blood 06/21/2016  . Recurrent boils 06/21/2016  . Pain in left knee 06/06/2016  . Allergic rhinitis 06/06/2016  . Abscess of vulva 06/06/2016  . Low back pain 06/06/2016  . Major  depressive disorder, recurrent episode, moderate ([facility]) 06/06/2016  . CKD (chronic kidney disease), stage III 06/06/2016  . Diabetes (HCC) 05/25/2016  . Hypertension 05/25/2016  . RLS (restless legs syndrome) 05/25/2016  . Arthritis of hand, right 05/25/2016  . Sleep apnea 05/25/2016  . Prominent metatarsal head 01/01/2016  . Hammertoe 01/01/2016  . Numbness and tingling 09/08/2015  . 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    Myrene Galas, PT DPT 06/07/2017, 2:35 PM  [facility] PHYSICAL AND SPORTS MEDICINE [address] [place], [address] Phone: [phone]   Fax:  [phone]  Name: Lilliane Sposito MRN: [id number] Date of Birth: 04-03-1956

## 2017-06-13 ENCOUNTER — Ambulatory Visit: Payer: Medicare Other

## 2017-06-13 DIAGNOSIS — M542 Cervicalgia: Secondary | ICD-10-CM

## 2017-06-13 DIAGNOSIS — M79602 Pain in left arm: Secondary | ICD-10-CM

## 2017-06-13 NOTE — Therapy (Signed)
[facility] PHYSICAL AND SPORTS MEDICINE [address] [place], [address] Phone: [phone]   Fax:  [phone]  Physical Therapy Treatment  Patient Details  Name: Kim Thomas MRN: [id number] Date of Birth: October 05, 1956 Referring Provider: Gabriel Cirri   Encounter Date: 06/13/2017      PT End of Session - 06/13/17 1620    Visit Number 6   Number of Visits 13   Date for PT Re-Evaluation 07/05/17   Authorization Type 6/ 10 G Code   PT Start Time 1612   PT Stop Time 1700   PT Time Calculation (min) 48 min   Activity Tolerance Patient tolerated treatment well   Behavior During Therapy [facility] for tasks assessed/performed      Past Medical History:  Diagnosis Date  . Anxiety   . Chronic kidney disease   . DDD (degenerative disc disease), cervical   . Diabetes mellitus without complication (HCC)   . Gout   . Hypertension   . Sleep apnea     Past Surgical History:  Procedure Laterality Date  . BREAST BIOPSY Left    benign  . BREAST CYST EXCISION Left   . c section     x2  . CHOLECYSTECTOMY    . FOOT SURGERY Bilateral   . KNEE ARTHROSCOPY Left 07/18/2016   Procedure: ARTHROSCOPY KNEE;  Surgeon: Donato Heinz, MD;  Location: [facility] ORS;  Service: Orthopedics;  Laterality: Left;  . KNEE ARTHROSCOPY WITH LATERAL MENISECTOMY Left 07/18/2016   Procedure: KNEE ARTHROSCOPY WITH PARTIAL  LATERAL MENISECTOMY, CHONDROPLASTY LATERAL FEMORAL CONDYLE;  Surgeon: Donato Heinz, MD;  Location: [facility] ORS;  Service: Orthopedics;  Laterality: Left;  . SPINE SURGERY  11/09/11   C5  . TUBAL LIGATION      There were no vitals filed for this visit.      Subjective Assessment - 06/13/17 1615    Subjective Patient reports she feels the exercises have been helping. Patient states she feels she has better technique with exercises.    Pertinent History PMH: previous LBP, ACDF C6-7, HTN, diabetes, stage III renal failue   Limitations Lifting;Other  (comment)  bending   Patient Stated Goals To decrease pain   Currently in Pain? Yes   Pain Score 6    Pain Location Arm   Pain Orientation Left   Pain Descriptors / Indicators Aching   Pain Type Chronic pain   Pain Onset 1 to 4 weeks ago      TREATMENT   Therapeutic Exercise:  Median nerve flossing in sitting - x 15 Serratus Punches with YTB - x 20 High Row in standing with YTB - x10, x 20  Elbow extension with YTB at door - x 20  Scapular retraction shoulder extension at door - x 20  Seated Chest press - x20 #10 at [facility] Straight arm push down at [facility] - x20 #10  Wall angels facing away from the wall - x 10 , x 10 facing toward the wall          PT Education - 06/13/17 1620    Education provided Yes   Education Details form/technique with exercise   Person(s) Educated Patient   Methods Explanation;Demonstration   Comprehension Verbalized understanding;Returned demonstration             PT Long Term Goals - 05/24/17 1454      PT LONG TERM GOAL #1   Title Patient will be independent with HEP to continue benefits of therapy  after discharge   Baseline Dependent for exercise performance and progression   Time 6   Period Weeks   Status New     PT LONG TERM GOAL #2   Title Patient will demonstrate improvement in L cervical rotation to equal the R side to allow for ease of performance of driving.    Baseline 5deg of L cerv. rotation   Time 6   Period Weeks   Status New     PT LONG TERM GOAL #3   Title Patient will improve NDI to under 20% to improve perceived cervical function and greater ability to perform dressing/washing activities.    Baseline 54% NDI   Time 6   Period Weeks   Status New               Plan - 06/13/17 1702    Clinical Impression Statement Patinet demonstrates significant improvement with overhead and scapular retraction movement indicating functional carryover between visits. Patient demonstrates decreased elbow extension  against resistance indicating increased weakness and poor coordination. Patient will benefit from further skilled therapy focused on improving limitations to return to prior level of function.    Rehab Potential Good   Clinical Impairments Affecting Rehab Potential (-) lives alone, diabetes (+) highly motivated, family support   PT Frequency 2x / week   PT Duration 6 weeks   PT Treatment/Interventions Aquatic Therapy;Moist Heat;Iontophoresis 4mg /ml Dexamethasone;Biofeedback;Cryotherapy;Ultrasound;Therapeutic exercise;Therapeutic activities;Functional mobility training;Gait training;Stair training;Neuromuscular re-education;Patient/family education;Balance training;Taping;Manual techniques;Dry needling;Electrical Stimulation   PT Next Visit Plan Progress strengthening and motor coordination   PT Home Exercise Plan See education   Consulted and Agree with Plan of Care Patient      Patient will benefit from skilled therapeutic intervention in order to improve the following deficits and impairments:  Pain, Hypomobility, Decreased balance, Decreased mobility, Impaired sensation, Decreased coordination, Increased muscle spasms, Hypermobility, Decreased endurance, Decreased activity tolerance, Decreased range of motion  Visit Diagnosis: Cervicalgia  Pain in left arm     Problem List Patient Active Problem List   Diagnosis Date Noted  . Advanced care planning/counseling discussion 02/20/2017  . Risk for falls 02/20/2017  . Pain management 02/20/2017  . Insomnia 06/21/2016  . Elevated uric acid in blood 06/21/2016  . Recurrent boils 06/21/2016  . Pain in left knee 06/06/2016  . Allergic rhinitis 06/06/2016  . Abscess of vulva 06/06/2016  . Low back pain 06/06/2016  . Major depressive disorder, recurrent episode, moderate (HCC) 06/06/2016  . CKD (chronic kidney disease), stage III 06/06/2016  . Diabetes (HCC) 05/25/2016  . Hypertension 05/25/2016  . RLS (restless legs syndrome)  05/25/2016  . Arthritis of hand, right 05/25/2016  . Sleep apnea 05/25/2016  . Prominent metatarsal head 01/01/2016  . Hammertoe 01/01/2016  . Numbness and tingling 09/08/2015  . 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    Myrene GalasWesley Kimberlyann Hollar, PT DPT 06/13/2017, 5:06 PM  [facility] [address] [place], [place], [address] Phone: [phone]   Fax:  [phone]  Name: Kim Aquasngel Thomas MRN: [id number] Date of Birth: 04/21/1956

## 2017-06-20 ENCOUNTER — Ambulatory Visit: Payer: Medicare Other

## 2017-06-20 DIAGNOSIS — M25512 Pain in left shoulder: Secondary | ICD-10-CM

## 2017-06-20 DIAGNOSIS — M79602 Pain in left arm: Secondary | ICD-10-CM

## 2017-06-20 DIAGNOSIS — M542 Cervicalgia: Secondary | ICD-10-CM

## 2017-06-20 NOTE — Therapy (Signed)
[facility] [address] [place], [place], [address] Phone: [phone]   Fax:  [phone]  Physical Therapy Treatment  Patient Details  Name: Kim Thomas MRN: [id number] Date of Birth: [birth year] Referring Provider: Gabriel Cirriheryl Wicker   Encounter Date: 06/20/2017      PT End of Session - 06/20/17 0955    Visit Number 7   Number of Visits 13   Date for PT Re-Evaluation 07/05/17   Authorization Type 7/ 10 G Code   PT Start Time 0900   PT Stop Time 0945   PT Time Calculation (min) 45 min   Activity Tolerance Patient tolerated treatment well   Behavior [facility] for tasks assessed/performed      Past Medical History:  Diagnosis Date  . Anxiety   . Chronic kidney disease   . DDD (degenerative disc disease), cervical   . Diabetes mellitus without complication (HCC)   . Gout   . Hypertension   . Sleep apnea     Past Surgical History:  Procedure Laterality Date  . BREAST BIOPSY Left    benign  . BREAST CYST EXCISION Left   . c section     x2  . CHOLECYSTECTOMY    . FOOT SURGERY Bilateral   . KNEE ARTHROSCOPY Left 07/18/2016   Procedure: ARTHROSCOPY KNEE;  Surgeon: Donato HeinzJames P Hooten, MD;  Location: [facility];  Service: Orthopedics;  Laterality: Left;  . KNEE ARTHROSCOPY WITH LATERAL MENISECTOMY Left 07/18/2016   Procedure: KNEE ARTHROSCOPY WITH PARTIAL  LATERAL MENISECTOMY, CHONDROPLASTY LATERAL FEMORAL CONDYLE;  Surgeon: Donato HeinzJames P Hooten, MD;  Location: [facility];  Service: Orthopedics;  Laterality: Left;  . SPINE SURGERY  11/09/11   C5  . TUBAL LIGATION      There were no vitals filed for this visit.      Subjective Assessment - 06/20/17 0953    Subjective Patient reports she feels the exercises have been helping and the pain has significanly decreased. Patient reports she continues to have a persistent pain radiating down her arm into her wrist/hand.   Pertinent History PMH: previous LBP, ACDF C6-7,  HTN, diabetes, stage III renal failue   Limitations Lifting;Other (comment)  bending   Patient Stated Goals To decrease pain   Currently in Pain? Yes   Pain Score 5    Pain Location Arm   Pain Orientation Left   Pain Descriptors / Indicators Aching   Pain Type Chronic pain   Pain Onset 1 to 4 weeks ago      TREATMENT  Manual Therapy: Thoracic Mobilizations T6-T1, cervical mobilization C3-7 central and unilaterally on the R side grade III-IV to help decrease pain and spasms in the upper back and radiating symptoms down the UE.   Therapeutic Exercise:  Median nerve flossing in sitting - x 15 Thoracic extension in sitting over towel - x 15 Ulnar nerve bias nerve flossing - x15 Serratus Punches in standing at wall - 2 x 10  Serratus Punches with YTB - x 20  Patient demonstrates decreased pain after performing manual therapy and therapeutic exercise       PT Education - 06/20/17 0954    Education provided Yes   Education Details form/technique with exercise   Person(s) Educated Patient   Methods Explanation;Demonstration   Comprehension Verbalized understanding;Returned demonstration             PT Long Term Goals - 05/24/17 1454      PT LONG TERM GOAL #  1   Title Patient will be independent with HEP to continue benefits of therapy after discharge   Baseline Dependent for exercise performance and progression   Time 6   Period Weeks   Status New     PT LONG TERM GOAL #2   Title Patient will demonstrate improvement in L cervical rotation to equal the R side to allow for ease of performance of driving.    Baseline 5deg of L cerv. rotation   Time 6   Period Weeks   Status New     PT LONG TERM GOAL #3   Title Patient will improve NDI to under 20% to improve perceived cervical function and greater ability to perform dressing/washing activities.    Baseline 54% NDI   Time 6   Period Weeks   Status New               Plan - 06/20/17 0955    Clinical  Impression Statement Patinet demonstrates decreased motor control with thoracic movements and requires frequent tactile and verbal cueing to correct. Patient continues to demonstrate significant carryover with pain and exercise performance. Patient continues to demonstrate increased radiaiting symptoms, but symptoms improve after manual therapy and thoracic extension exercise. Patient will benefit from further skilled therapy to return to prior level of function.    Rehab Potential Good   Clinical Impairments Affecting Rehab Potential (-) lives alone, diabetes (+) highly motivated, family support   PT Frequency 2x / week   PT Duration 6 weeks   PT Treatment/Interventions Aquatic Therapy;Moist Heat;Iontophoresis 4mg /ml Dexamethasone;Biofeedback;Cryotherapy;Ultrasound;Therapeutic exercise;Therapeutic activities;Functional mobility training;Gait training;Stair training;Neuromuscular re-education;Patient/family education;Balance training;Taping;Manual techniques;Dry needling;Electrical Stimulation   PT Next Visit Plan Progress strengthening and motor coordination   PT Home Exercise Plan See education   Consulted and Agree with Plan of Care Patient      Patient will benefit from skilled therapeutic intervention in order to improve the following deficits and impairments:  Pain, Hypomobility, Decreased balance, Decreased mobility, Impaired sensation, Decreased coordination, Increased muscle spasms, Hypermobility, Decreased endurance, Decreased activity tolerance, Decreased range of motion  Visit Diagnosis: Cervicalgia  Pain in left arm  Acute pain of left shoulder     Problem List Patient Active Problem List   Diagnosis Date Noted  . Advanced care planning/counseling discussion 02/20/2017  . Risk for falls 02/20/2017  . Pain management 02/20/2017  . Insomnia 06/21/2016  . Elevated uric acid in blood 06/21/2016  . Recurrent boils 06/21/2016  . Pain in left knee 06/06/2016  . Allergic  rhinitis 06/06/2016  . Abscess of vulva 06/06/2016  . Low back pain 06/06/2016  . Major depressive disorder, recurrent episode, moderate (HCC) 06/06/2016  . CKD (chronic kidney disease), stage III 06/06/2016  . Diabetes (HCC) 05/25/2016  . Hypertension 05/25/2016  . RLS (restless legs syndrome) 05/25/2016  . Arthritis of hand, right 05/25/2016  . Sleep apnea 05/25/2016  . Prominent metatarsal head 01/01/2016  . Hammertoe 01/01/2016  . Numbness and tingling 09/08/2015  . 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    Myrene Galas, PT DPT 06/20/2017, 9:59 AM   [facility] PHYSICAL AND SPORTS MEDICINE [address], [place], [address] Phone: [phone]   Fax:  [phone]  Name: Kim Thomas MRN: [id number] Date of Birth: Sep 16, 1956

## 2017-06-22 ENCOUNTER — Ambulatory Visit: Payer: Medicare Other

## 2017-06-22 DIAGNOSIS — M542 Cervicalgia: Secondary | ICD-10-CM

## 2017-06-22 DIAGNOSIS — M25512 Pain in left shoulder: Secondary | ICD-10-CM

## 2017-06-22 DIAGNOSIS — M79602 Pain in left arm: Secondary | ICD-10-CM

## 2017-06-22 NOTE — Therapy (Signed)
[facility] PHYS[facility] MEDICINE [address] [place], [place], [address] Phone: [phone]   Fax:  [phone]  Physical Therapy Treatment  Patient Details  Name: Kim Thomas MRN: [id number] Date of Birth: Feb 11, 1956 Referring Provider: Gabriel Cirriheryl Wicker   Encounter Date: 06/22/2017      PT End of Session - 06/22/17 1327    Visit Number 8   Number of Visits 13   Date for PT Re-Evaluation 07/05/17   Authorization Type 8/ 10 G Code   PT Start Time 1307   PT Stop Time 1345   PT Time Calculation (min) 38 min   Activity Tolerance Patient tolerated treatment well   Behavior During Therapy [facility] for tasks assessed/performed      Past Medical History:  Diagnosis Date  . Anxiety   . Chronic kidney disease   . DDD (degenerative disc disease), cervical   . Diabetes mellitus without complication (HCC)   . Gout   . Hypertension   . Sleep apnea     Past Surgical History:  Procedure Laterality Date  . BREAST BIOPSY Left    benign  . BREAST CYST EXCISION Left   . c section     x2  . CHOLECYSTECTOMY    . FOOT SURGERY Bilateral   . KNEE ARTHROSCOPY Left 07/18/2016   Procedure: ARTHROSCOPY KNEE;  Surgeon: Donato HeinzJames P Hooten, MD;  Location: [facility] OR[facility];  Service: Orthopedics;  Laterality: Left;  . KNEE ARTHROSCOPY WITH LATERAL MENISECTOMY Left 07/18/2016   Procedure: KNEE ARTHROSCOPY WITH PARTIAL  LATERAL MENISECTOMY, CHONDROPLASTY LATERAL FEMORAL CONDYLE;  Surgeon: Donato HeinzJames P Hooten, MD;  Location: [facility];  Service: Orthopedics;  Laterality: Left;  . SPINE SURGERY  11/09/11   C5  . TUBAL LIGATION      There were no vitals filed for this visit.      Subjective Assessment - 06/22/17 1321    Subjective Patinet reports no pain today and states decreased symptoms going into her hand.    Pertinent History PMH: previous LBP, ACDF C6-7, HTN, diabetes, stage III renal failue   Limitations Lifting;Other (comment)  bending   Patient Stated Goals To  decrease pain   Currently in Pain? No/denies   Pain Onset 1 to 4 weeks ago      TREATMENT     Therapeutic Exercise:  Median nerve flossing in sitting - x 20 Straight arm push at [place] - x 20 15# Straight arm scapular depression in standing - x25 5# Scapular retraction at [place] -x20 15# Seated High Row at [place] - x30 20# Serratus Punches with RTB - x 20 Serratus Punches in standing at wall - x25  Thoracic extension in sitting over towel - x 10 Ulnar nerve bias nerve flossing - x15   Patient demonstrates decreased pain after performing manual therapy and therapeutic exercise       PT Education - 06/22/17 1326    Education provided Yes   Education Details form/technique with exercise   Person(s) Educated Patient   Methods Demonstration;Explanation   Comprehension Verbalized understanding;Returned demonstration             PT Long Term Goals - 05/24/17 1454      PT LONG TERM GOAL #1   Title Patient will be independent with HEP to continue benefits of therapy after discharge   Baseline Dependent for exercise performance and progression   Time 6   Period Weeks   Status New     PT LONG TERM GOAL #2  Title Patient will demonstrate improvement in L cervical rotation to equal the R side to allow for ease of performance of driving.    Baseline 5deg of L cerv. rotation   Time 6   Period Weeks   Status New     PT LONG TERM GOAL #3   Title Patient will improve NDI to under 20% to improve perceived cervical function and greater ability to perform dressing/washing activities.    Baseline 54% NDI   Time 6   Period Weeks   Status New               Plan - 06/22/17 1336    Clinical Impression Statement Patient demonstrates improvement with exercise with ability to perform greater amount of exercises throughout greater range of motion. Although patient is improving, she continues to have increased radicular pain down her L UE and will benefit from furhter skilled  therapy to return to prior level of function.    Rehab Potential Good   Clinical Impairments Affecting Rehab Potential (-) lives alone, diabetes (+) highly motivated, family support   PT Frequency 2x / week   PT Duration 6 weeks   PT Treatment/Interventions Aquatic Therapy;Moist Heat;Iontophoresis 4mg /ml Dexamethasone;Biofeedback;Cryotherapy;Ultrasound;Therapeutic exercise;Therapeutic activities;Functional mobility training;Gait training;Stair training;Neuromuscular re-education;Patient/family education;Balance training;Taping;Manual techniques;Dry needling;Electrical Stimulation   PT Next Visit Plan Progress strengthening and motor coordination   PT Home Exercise Plan See education   Consulted and Agree with Plan of Care Patient      Patient will benefit from skilled therapeutic intervention in order to improve the following deficits and impairments:  Pain, Hypomobility, Decreased balance, Decreased mobility, Impaired sensation, Decreased coordination, Increased muscle spasms, Hypermobility, Decreased endurance, Decreased activity tolerance, Decreased range of motion  Visit Diagnosis: Cervicalgia  Pain in left arm  Acute pain of left shoulder     Problem List Patient Active Problem List   Diagnosis Date Noted  . Advanced care planning/counseling discussion 02/20/2017  . Risk for falls 02/20/2017  . Pain management 02/20/2017  . Insomnia 06/21/2016  . Elevated uric acid in blood 06/21/2016  . Recurrent boils 06/21/2016  . Pain in left knee 06/06/2016  . Allergic rhinitis 06/06/2016  . Abscess of vulva 06/06/2016  . Low back pain 06/06/2016  . Major depressive disorder, recurrent episode, moderate (HCC) 06/06/2016  . CKD (chronic kidney disease), stage III 06/06/2016  . Diabetes (HCC) 05/25/2016  . Hypertension 05/25/2016  . RLS (restless legs syndrome) 05/25/2016  . Arthritis of hand, right 05/25/2016  . Sleep apnea 05/25/2016  . Prominent metatarsal head 01/01/2016  .  Hammertoe 01/01/2016  . Numbness and tingling 09/08/2015  . 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    Myrene GalasWesley Jean Alejos, PT DPT 06/22/2017, 1:40 PM  [facility] [address] [place], [place], [address] Phone: [phone]   Fax:  [phone]  Name: Kim Thomas MRN: [id number] Date of Birth: 15-Jun-1956

## 2017-06-27 ENCOUNTER — Ambulatory Visit: Payer: Medicare Other

## 2017-06-29 ENCOUNTER — Ambulatory Visit: Payer: Medicare Other | Attending: Nurse Practitioner

## 2017-06-29 DIAGNOSIS — M25512 Pain in left shoulder: Secondary | ICD-10-CM | POA: Diagnosis present

## 2017-06-29 DIAGNOSIS — M79602 Pain in left arm: Secondary | ICD-10-CM | POA: Insufficient documentation

## 2017-06-29 DIAGNOSIS — M542 Cervicalgia: Secondary | ICD-10-CM | POA: Diagnosis not present

## 2017-06-29 NOTE — Therapy (Signed)
[place] [facility] [address] [place], [place], [address] Phone: [phone]   Fax:  [phone]  Physical Therapy Treatment  Patient Details  Name: Kim Thomas MRN: [id number] Date of Birth: Aug 02, 1956 Referring Provider: Gabriel Cirriheryl Wicker   Encounter Date: 06/29/2017      PT End of Session - 06/29/17 1708    Visit Number 9   Number of Visits 13   Date for PT Re-Evaluation 07/05/17   Authorization Type 9/ 10 G Code   PT Start Time 1700   PT Stop Time 1745   PT Time Calculation (min) 45 min   Activity Tolerance Patient tolerated treatment well   Behavior [facility] for tasks assessed/performed      Past Medical History:  Diagnosis Date  . Anxiety   . Chronic kidney disease   . DDD (degenerative disc disease), cervical   . Diabetes mellitus without complication (HCC)   . Gout   . Hypertension   . Sleep apnea     Past Surgical History:  Procedure Laterality Date  . BREAST BIOPSY Left    benign  . BREAST CYST EXCISION Left   . c section     x2  . CHOLECYSTECTOMY    . FOOT SURGERY Bilateral   . KNEE ARTHROSCOPY Left 07/18/2016   Procedure: ARTHROSCOPY KNEE;  Surgeon: Donato HeinzJames P Hooten, MD;  Location: [facility];  Service: Orthopedics;  Laterality: Left;  . KNEE ARTHROSCOPY WITH LATERAL MENISECTOMY Left 07/18/2016   Procedure: KNEE ARTHROSCOPY WITH PARTIAL  LATERAL MENISECTOMY, CHONDROPLASTY LATERAL FEMORAL CONDYLE;  Surgeon: Donato HeinzJames P Hooten, MD;  Location: [facility];  Service: Orthopedics;  Laterality: Left;  . SPINE SURGERY  11/09/11   C5  . TUBAL LIGATION      There were no vitals filed for this visit.      Subjective Assessment - 06/29/17 1706    Subjective Patient reports no pain and is having only a few "bad days". Patient reports her exercises are becoming easier to perform.    Pertinent History PMH: previous LBP, ACDF C6-7, HTN, diabetes, stage III renal failue   Limitations Lifting;Other  (comment)  bending   Patient Stated Goals To decrease pain   Currently in Pain? No/denies   Pain Onset 1 to 4 weeks ago        TREATMENT     Therapeutic Exercise:  Ulnar nerve bias nerve flossing - x15 Shoulder Adduction with RTB - x 20 Shoulder flexion on yellow physioball - x 20  Straight arm push down at OMEGA - x 20 15# Scapular retraction at OMEGA -x30 20# Seated High Row at [employer] - x30 25# Seated facing OMEGA scapular retraction - x 20 25# Shoulder flexion with YTB around wrists to improve shoulder ER - x 20 , x 10 with RTB Straight arm shoulder extension against GTB in standing - x2  Median nerve flossing in sitting - x 20   Patient demonstrates decreased pain after performing therapeutic exercises        PT Education - 06/29/17 1707    Education provided Yes   Education Details form/technique with exercise   Person(s) Educated Patient   Methods Explanation;Demonstration   Comprehension Verbalized understanding;Returned demonstration             PT Long Term Goals - 05/24/17 1454      PT LONG TERM GOAL #1   Title Patient will be independent with HEP to continue benefits of therapy after discharge  Baseline Dependent for exercise performance and progression   Time 6   Period Weeks   Status New     PT LONG TERM GOAL #2   Title Patient will demonstrate improvement in L cervical rotation to equal the R side to allow for ease of performance of driving.    Baseline 5deg of L cerv. rotation   Time 6   Period Weeks   Status New     PT LONG TERM GOAL #3   Title Patient will improve NDI to under 20% to improve perceived cervical function and greater ability to perform dressing/washing activities.    Baseline 54% NDI   Time 6   Period Weeks   Status New               Plan - 06/29/17 1733    Clinical Impression Statement Patient demonstrates improvement in ability to perform exercises with ability to perform greater amount of exercises without  pain. Patient continues to demonstrate decreased coordination requiring frequent cueing on performing exercises and will benefit from further skilled therapy to return to prior level of function.    Rehab Potential Good   Clinical Impairments Affecting Rehab Potential (-) lives alone, diabetes (+) highly motivated, family support   PT Frequency 2x / week   PT Duration 6 weeks   PT Treatment/Interventions Aquatic Therapy;Moist Heat;Iontophoresis 4mg /ml Dexamethasone;Biofeedback;Cryotherapy;Ultrasound;Therapeutic exercise;Therapeutic activities;Functional mobility training;Gait training;Stair training;Neuromuscular re-education;Patient/family education;Balance training;Taping;Manual techniques;Dry needling;Electrical Stimulation   PT Next Visit Plan Progress strengthening and motor coordination   PT Home Exercise Plan See education   Consulted and Agree with Plan of Care Patient      Patient will benefit from skilled therapeutic intervention in order to improve the following deficits and impairments:  Pain, Hypomobility, Decreased balance, Decreased mobility, Impaired sensation, Decreased coordination, Increased muscle spasms, Hypermobility, Decreased endurance, Decreased activity tolerance, Decreased range of motion  Visit Diagnosis: Cervicalgia  Acute pain of left shoulder     Problem List Patient Active Problem List   Diagnosis Date Noted  . Advanced care planning/counseling discussion 02/20/2017  . Risk for falls 02/20/2017  . Pain management 02/20/2017  . Insomnia 06/21/2016  . Elevated uric acid in blood 06/21/2016  . Recurrent boils 06/21/2016  . Pain in left knee 06/06/2016  . Allergic rhinitis 06/06/2016  . Abscess of vulva 06/06/2016  . Low back pain 06/06/2016  . Major depressive disorder, recurrent episode, moderate (HCC) 06/06/2016  . CKD (chronic kidney disease), stage III 06/06/2016  . Diabetes (HCC) 05/25/2016  . Hypertension 05/25/2016  . RLS (restless legs  syndrome) 05/25/2016  . Arthritis of hand, right 05/25/2016  . Sleep apnea 05/25/2016  . Prominent metatarsal head 01/01/2016  . Hammertoe 01/01/2016  . Numbness and tingling 09/08/2015  . 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 [place], [place], [address] Phone: [phone]   Fax:  [phone]  Name: Kim Thomas MRN: [id number] Date of Birth: 05-Sep-1956

## 2017-06-30 ENCOUNTER — Ambulatory Visit: Payer: Medicare Other | Admitting: Unknown Physician Specialty

## 2017-06-30 ENCOUNTER — Ambulatory Visit (INDEPENDENT_AMBULATORY_CARE_PROVIDER_SITE_OTHER): Payer: Medicare Other | Admitting: Unknown Physician Specialty

## 2017-06-30 ENCOUNTER — Encounter: Payer: Self-pay | Admitting: Unknown Physician Specialty

## 2017-06-30 VITALS — BP 116/73 | HR 86 | Temp 98.2°F | Wt 194.0 lb

## 2017-06-30 DIAGNOSIS — Z79899 Other long term (current) drug therapy: Secondary | ICD-10-CM | POA: Diagnosis not present

## 2017-06-30 DIAGNOSIS — I10 Essential (primary) hypertension: Secondary | ICD-10-CM

## 2017-06-30 DIAGNOSIS — Z9181 History of falling: Secondary | ICD-10-CM

## 2017-06-30 DIAGNOSIS — E119 Type 2 diabetes mellitus without complications: Secondary | ICD-10-CM | POA: Diagnosis not present

## 2017-06-30 DIAGNOSIS — Z0000 Encounter for general adult medical examination without abnormal findings: Secondary | ICD-10-CM

## 2017-06-30 LAB — BAYER DCA HB A1C WAIVED: HB A1C (BAYER DCA - WAIVED): 6.6 % (ref <7.0)

## 2017-06-30 NOTE — Progress Notes (Signed)
BP 116/73   Pulse 86   Temp 98.2 F (36.8 C)   Wt 194 lb (88 kg)   LMP  (LMP Unknown)   SpO2 95%   BMI 32.28 kg/m    Subjective:    Patient ID: Kim Thomas, female    DOB: July 19, 1956, 61 y.o.   MRN: [id number]  HPI: Kim Aquasngel Garro is a 61 y.o. female  Chief Complaint  Patient presents with  . Diabetes    pt states she know she needs to have an eye exam   . Hyperlipidemia  . Hypertension  . Labs Only    medicare patient- interested in Hep C and HIV labs    Diabetes: Using medications without difficulties.  Under excellent control  Seeking to wean off some medications and supplements No hypoglycemic episodes No hyperglycemic episodes Feet problems: none Blood Sugars averaging:103 eye exam within last year Last Hgb A1C: 6.2  Hypertension  Using medications without difficulty  Using medication without problems or lightheadedness No chest pain with exertion or shortness of breath No Edema  Elevated Cholesterol Using medications without problems No Muscle aches  Diet: Exercise:Exercising 3 times a day through physical therapy  Falls risk She feel high amount of falls was due to her neck pain which has improved.  States Tramadol "is on the counter" and not using it.    Depression screen [facility] 2/9 06/30/2017 06/02/2017 02/20/2017  Decreased Interest 3 0 1  Down, Depressed, Hopeless 0 0 0  PHQ - 2 Score 3 0 1  Altered sleeping 0 - 0  Tired, decreased energy 0 - 0  Change in appetite 0 - 0  Feeling bad or failure about yourself  0 - 0  Trouble concentrating 0 - 0  Moving slowly or fidgety/restless - - 0  Suicidal thoughts 0 - 0  PHQ-9 Score 3 - 1   Family History  Problem Relation Age of Onset  . Cancer Mother        breast  . Hypertension Mother   . Hyperlipidemia Mother   . Diabetes Mother   . Glaucoma Mother   . Cataracts Mother   . Breast cancer Mother   . Cancer Father   . Cancer Maternal Grandfather   . Breast cancer Maternal Aunt   . Breast cancer  Cousin   . Alcohol abuse Neg Hx    Social History   Social History  . Marital status: Single    Spouse name: N/A  . Number of children: N/A  . Years of education: N/A   Occupational History  . Not on file.   Social History Main Topics  . Smoking status: Former Smoker    Quit date: 05/21/2011  . Smokeless tobacco: Never Used  . Alcohol use No  . Drug use: No  . Sexual activity: Not Currently   Other Topics Concern  . Not on file   Social History Narrative  . No narrative on file   Past Medical History:  Diagnosis Date  . Anxiety   . Chronic kidney disease   . DDD (degenerative disc disease), cervical   . Diabetes mellitus without complication (HCC)   . Gout   . Hypertension   . Sleep apnea    Past Surgical History:  Procedure Laterality Date  . BREAST BIOPSY Left    benign  . BREAST CYST EXCISION Left   . c section     x2  . CHOLECYSTECTOMY    . FOOT SURGERY Bilateral   . KNEE  ARTHROSCOPY Left 07/18/2016   Procedure: ARTHROSCOPY KNEE;  Surgeon: Donato HeinzJames P Hooten, MD;  Location: [facility];  Service: Orthopedics;  Laterality: Left;  . KNEE ARTHROSCOPY WITH LATERAL MENISECTOMY Left 07/18/2016   Procedure: KNEE ARTHROSCOPY WITH PARTIAL  LATERAL MENISECTOMY, CHONDROPLASTY LATERAL FEMORAL CONDYLE;  Surgeon: Donato HeinzJames P Hooten, MD;  Location: [facility];  Service: Orthopedics;  Laterality: Left;  . SPINE SURGERY  11/09/11   C5  . TUBAL LIGATION     Relevant past medical, surgical, family and social history reviewed and updated as indicated. Interim medical history since our last visit reviewed. Allergies and medications reviewed and updated.  Review of Systems  Per HPI unless specifically indicated above     Objective:    BP 116/73   Pulse 86   Temp 98.2 F (36.8 C)   Wt 194 lb (88 kg)   LMP  (LMP Unknown)   SpO2 95%   BMI 32.28 kg/m   Wt Readings from Last 3 Encounters:  06/30/17 194 lb (88 kg)  06/02/17 193 lb 6.4 oz (87.7 kg)  05/23/17 198 lb (89.8 kg)      Physical Exam  Constitutional: She is oriented to person, place, and time. She appears well-developed and well-nourished. No distress.  HENT:  Head: Normocephalic and atraumatic.  Eyes: Conjunctivae and lids are normal. Right eye exhibits no discharge. Left eye exhibits no discharge. No scleral icterus.  Neck: Normal range of motion. Neck supple. No JVD present. Carotid bruit is not present.  Cardiovascular: Normal rate, regular rhythm and normal heart sounds.   Pulmonary/Chest: Effort normal and breath sounds normal.  Abdominal: Normal appearance. There is no splenomegaly or hepatomegaly.  Musculoskeletal: Normal range of motion.  Neurological: She is alert and oriented to person, place, and time.  Skin: Skin is warm, dry and intact. No rash noted. No pallor.  Psychiatric: She has a normal mood and affect. Her behavior is normal. Judgment and thought content normal.    Results for orders placed or performed in visit on 03/28/17  HM DIABETES FOOT EXAM  Result Value Ref Range   HM Diabetic Foot Exam bilateral- normal       Assessment & Plan:   Problem List Items Addressed This Visit      Unprioritized   Diabetes (HCC) - Primary    Excellent control with Hgb A1C of 6.6%      Relevant Orders   Comprehensive metabolic panel   Bayer DCA Hb [id number] Waived   Lipid Panel w/o Chol/HDL Ratio   Hypertension   Relevant Orders   Comprehensive metabolic panel   Lipid Panel w/o Chol/HDL Ratio   Polypharmacy    Discussed stopping Calcium, Vit C, Ginkgo, Vit B6 due to lack of evidence of benefit  Stop Allopurinol due to no history of gout symptoms      Risk for falls    Other Visit Diagnoses    Routine general medical examination at a health care facility       Relevant Orders   CBC with Differential/Platelet   TSH   VITAMIN D 25 Hydroxy (Vit-D Deficiency, Fractures)   Hepatitis C antibody       Follow up plan: Return in about 3 months (around 09/30/2017).

## 2017-06-30 NOTE — Assessment & Plan Note (Signed)
Discussed stopping Calcium, Vit C, Ginkgo, Vit B6 due to lack of evidence of benefit  Stop Allopurinol due to no history of gout symptoms

## 2017-06-30 NOTE — Patient Instructions (Signed)
Consider not taking: Ginkgo - No evidence that it improves memory Vitamin C Calcium - No evidence for osteoporosis prevention  Consider not taking: Vitamin B6  Chromium  Instead Cinnamon capsules

## 2017-06-30 NOTE — Assessment & Plan Note (Signed)
Excellent control with Hgb A1C of 6.6%

## 2017-07-01 LAB — CBC WITH DIFFERENTIAL/PLATELET
Basophils Absolute: 0 10*3/uL (ref 0.0–0.2)
Basos: 0 %
EOS (ABSOLUTE): 0.1 10*3/uL (ref 0.0–0.4)
Eos: 1 %
Hematocrit: 36.2 % (ref 34.0–46.6)
Hemoglobin: 11.8 g/dL (ref 11.1–15.9)
Immature Grans (Abs): 0 10*3/uL (ref 0.0–0.1)
Immature Granulocytes: 0 %
Lymphocytes Absolute: 2.3 10*3/uL (ref 0.7–3.1)
Lymphs: 34 %
MCH: 30.3 pg (ref 26.6–33.0)
MCHC: 32.6 g/dL (ref 31.5–35.7)
MCV: 93 fL (ref 79–97)
Monocytes Absolute: 0.5 10*3/uL (ref 0.1–0.9)
Monocytes: 8 %
Neutrophils Absolute: 3.9 10*3/uL (ref 1.4–7.0)
Neutrophils: 57 %
Platelets: 259 10*3/uL (ref 150–379)
RBC: 3.9 x10E6/uL (ref 3.77–5.28)
RDW: 14.5 % (ref 12.3–15.4)
WBC: 6.8 10*3/uL (ref 3.4–10.8)

## 2017-07-01 LAB — COMPREHENSIVE METABOLIC PANEL
ALT: 20 IU/L (ref 0–32)
AST: 27 IU/L (ref 0–40)
Albumin/Globulin Ratio: 1.4 (ref 1.2–2.2)
Albumin: 4.2 g/dL (ref 3.6–4.8)
Alkaline Phosphatase: 69 IU/L (ref 39–117)
BUN/Creatinine Ratio: 19 (ref 12–28)
BUN: 24 mg/dL (ref 8–27)
Bilirubin Total: <0.2 mg/dL (ref 0.0–1.2)
CO2: 20 mmol/L (ref 20–29)
Calcium: 9.6 mg/dL (ref 8.7–10.3)
Chloride: 104 mmol/L (ref 96–106)
Creatinine, Ser: 1.25 mg/dL — ABNORMAL HIGH (ref 0.57–1.00)
GFR calc Af Amer: 54 mL/min/{1.73_m2} — ABNORMAL LOW (ref >59)
GFR calc non Af Amer: 47 mL/min/{1.73_m2} — ABNORMAL LOW (ref >59)
Globulin, Total: 3.1 g/dL (ref 1.5–4.5)
Glucose: 87 mg/dL (ref 65–99)
Potassium: 4.4 mmol/L (ref 3.5–5.2)
Sodium: 141 mmol/L (ref 134–144)
Total Protein: 7.3 g/dL (ref 6.0–8.5)

## 2017-07-01 LAB — LIPID PANEL W/O CHOL/HDL RATIO
Cholesterol, Total: 189 mg/dL (ref 100–199)
HDL: 61 mg/dL (ref >39)
LDL Calculated: 89 mg/dL (ref 0–99)
Triglycerides: 193 mg/dL — ABNORMAL HIGH (ref 0–149)
VLDL Cholesterol Cal: 39 mg/dL (ref 5–40)

## 2017-07-01 LAB — VITAMIN D 25 HYDROXY (VIT D DEFICIENCY, FRACTURES): Vit D, 25-Hydroxy: 40.7 ng/mL (ref 30.0–100.0)

## 2017-07-01 LAB — HEPATITIS C ANTIBODY: Hep C Virus Ab: <0.1 s/co ratio (ref 0.0–0.9)

## 2017-07-01 LAB — TSH: TSH: 0.608 u[IU]/mL (ref 0.450–4.500)

## 2017-07-03 ENCOUNTER — Encounter: Payer: Self-pay | Admitting: Unknown Physician Specialty

## 2017-07-05 ENCOUNTER — Ambulatory Visit: Payer: Medicare Other

## 2017-07-05 DIAGNOSIS — M542 Cervicalgia: Secondary | ICD-10-CM

## 2017-07-05 DIAGNOSIS — M79602 Pain in left arm: Secondary | ICD-10-CM

## 2017-07-05 DIAGNOSIS — M25512 Pain in left shoulder: Secondary | ICD-10-CM

## 2017-07-05 NOTE — Therapy (Signed)
[facility] [facility] [address] [place], [place], [address] Phone: [phone]   Fax:  [phone]  Physical Therapy Treatment  Patient Details  Name: Kim Thomas MRN: [id number] Date of Birth: Oct 23, 1956 Referring Provider: Gabriel Cirriheryl Wicker   Encounter Date: 07/05/2017      PT End of Session - 07/05/17 1722    Visit Number 10   Number of Visits 13   Date for PT Re-Evaluation 07/05/17   Authorization Type 10/ 10 G Code   PT Start Time 1645   PT Stop Time 1715   PT Time Calculation (min) 30 min   Activity Tolerance Patient tolerated treatment well   Behavior During Therapy [facility] for tasks assessed/performed      Past Medical History:  Diagnosis Date  . Anxiety   . Chronic kidney disease   . DDD (degenerative disc disease), cervical   . Diabetes mellitus without complication (HCC)   . Gout   . Hypertension   . Sleep apnea     Past Surgical History:  Procedure Laterality Date  . BREAST BIOPSY Left    benign  . BREAST CYST EXCISION Left   . c section     x2  . CHOLECYSTECTOMY    . FOOT SURGERY Bilateral   . KNEE ARTHROSCOPY Left 07/18/2016   Procedure: ARTHROSCOPY KNEE;  Surgeon: Donato HeinzJames P Hooten, MD;  Location: [facility];  Service: Orthopedics;  Laterality: Left;  . KNEE ARTHROSCOPY WITH LATERAL MENISECTOMY Left 07/18/2016   Procedure: KNEE ARTHROSCOPY WITH PARTIAL  LATERAL MENISECTOMY, CHONDROPLASTY LATERAL FEMORAL CONDYLE;  Surgeon: Donato HeinzJames P Hooten, MD;  Location: [facility];  Service: Orthopedics;  Laterality: Left;  . SPINE SURGERY  11/09/11   C5  . TUBAL LIGATION      There were no vitals filed for this visit.      Subjective Assessment - 07/05/17 1721    Subjective Patient reports no pain currently but states she continues to have a tingling sensation into the shoulder/arm. Patient demonstrates 0/10 pain.    Pertinent History PMH: previous LBP, ACDF C6-7, HTN, diabetes, stage III renal failue   Limitations  Lifting;Other (comment)  bending   Patient Stated Goals To decrease pain   Currently in Pain? No/denies   Pain Onset 1 to 4 weeks ago      Subjective: Therapeutic Exercise: Seated ball roll outs - x20 with physioball  Scapular retraction at OMEGA  -- x20 20# Straight arm push downs in standing - x 20  20#  Seated Lat pull downs at [employer] - x20 20# Performing Shoulder ER with YTB around wrists - x 20  Seated High row at [facility] - x25 #25 Wall angels back against ball - 2 x 20   Observation: 24% NDI       PT Education - 07/05/17 1721    Education provided Yes   Education Details Form/technique with exercise   Person(s) Educated Patient   Methods Explanation;Demonstration   Comprehension Verbalized understanding;Returned demonstration             PT Long Term Goals - 07/05/17 1726      PT LONG TERM GOAL #1   Title Patient will be independent with HEP to continue benefits of therapy after discharge   Baseline Dependent for exercise performance and progression; 07/05/17: moderate cueing for exercise performance.    Time 6   Period Weeks   Status On-going     PT LONG TERM GOAL #2   Title Patient  will demonstrate improvement in L cervical rotation to equal the R side to allow for ease of performance of driving.    Baseline 5deg of L cerv. rotation; 07/05/2017 5 deg from R side   Time 6   Period Weeks   Status On-going     PT LONG TERM GOAL #3   Title Patient will improve NDI to under 20% to improve perceived cervical function and greater ability to perform dressing/washing activities.    Baseline 54% NDI; 07/05/17: 24%   Time 6   Period Weeks   Status On-going               Plan - 07/05/17 1723    Clinical Impression Statement Patient demonstrates improvement in pain and symptoms compared to previous visits. Patient able to perform greater amount of exercises compared to previous visits indicating functional carryover between visits. Patient will benefit from  further skilled therapy focused on improving current limitations to return to prior level of function.  Patient continues to demonstrates improvement still continues to have symptoms raidiating into her arm and will benefit from further skilled therapy.    Rehab Potential Good   Clinical Impairments Affecting Rehab Potential (-) lives alone, diabetes (+) highly motivated, family support   PT Frequency 2x / week   PT Duration 6 weeks   PT Treatment/Interventions Aquatic Therapy;Moist Heat;Iontophoresis 4mg /ml Dexamethasone;Biofeedback;Cryotherapy;Ultrasound;Therapeutic exercise;Therapeutic activities;Functional mobility training;Gait training;Stair training;Neuromuscular re-education;Patient/family education;Balance training;Taping;Manual techniques;Dry needling;Electrical Stimulation   PT Next Visit Plan Progress strengthening and motor coordination   PT Home Exercise Plan See education   Consulted and Agree with Plan of Care Patient      Patient will benefit from skilled therapeutic intervention in order to improve the following deficits and impairments:  Pain, Hypomobility, Decreased balance, Decreased mobility, Impaired sensation, Decreased coordination, Increased muscle spasms, Hypermobility, Decreased endurance, Decreased activity tolerance, Decreased range of motion  Visit Diagnosis: Cervicalgia  Acute pain of left shoulder  Pain in left arm     Problem List Patient Active Problem List   Diagnosis Date Noted  . Polypharmacy 06/30/2017  . Advanced care planning/counseling discussion 02/20/2017  . Risk for falls 02/20/2017  . Pain management 02/20/2017  . Insomnia 06/21/2016  . Elevated uric acid in blood 06/21/2016  . Recurrent boils 06/21/2016  . Pain in left knee 06/06/2016  . Allergic rhinitis 06/06/2016  . Abscess of vulva 06/06/2016  . Low back pain 06/06/2016  . Major depressive disorder, recurrent episode, moderate (HCC) 06/06/2016  . CKD (chronic kidney disease),  stage III 06/06/2016  . Diabetes (HCC) 05/25/2016  . Hypertension 05/25/2016  . RLS (restless legs syndrome) 05/25/2016  . Arthritis of hand, right 05/25/2016  . Sleep apnea 05/25/2016  . Prominent metatarsal head 01/01/2016  . Hammertoe 01/01/2016  . Numbness and tingling 09/08/2015  . 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 [place], [address] Phone: [phone]   Fax:  [phone]  Name: Flor Whitacre MRN: [id number] Date of Birth: [birth year]

## 2017-07-11 ENCOUNTER — Ambulatory Visit: Payer: Medicare Other

## 2017-07-11 DIAGNOSIS — M25512 Pain in left shoulder: Secondary | ICD-10-CM

## 2017-07-11 DIAGNOSIS — M542 Cervicalgia: Secondary | ICD-10-CM | POA: Diagnosis not present

## 2017-07-11 DIAGNOSIS — M79602 Pain in left arm: Secondary | ICD-10-CM

## 2017-07-11 NOTE — Therapy (Signed)
[place] [facility] PHYS[facility] AND [facility] MEDICINE [address] [place], [place], [address] Phone: [phone]   Fax:  [phone]  Physical Therapy Treatment  Patient Details  Name: Kim Aquasngel Sine MRN: [id number] Date of Birth: [birth year] Referring Provider: Gabriel Cirriheryl Wicker   Encounter Date: 07/11/2017      PT End of Session - 07/11/17 1716    Visit Number 11   Number of Visits 21   Date for PT Re-Evaluation 08/02/17   Authorization Type 1/ 10 G Code   PT Start Time 1600   PT Stop Time 1645   PT Time Calculation (min) 45 min   Activity Tolerance Patient tolerated treatment well   Behavior During Therapy [facility] for tasks assessed/performed      Past Medical History:  Diagnosis Date  . Anxiety   . Chronic kidney disease   . DDD (degenerative disc disease), cervical   . Diabetes mellitus without complication (HCC)   . Gout   . Hypertension   . Sleep apnea     Past Surgical History:  Procedure Laterality Date  . BREAST BIOPSY Left    benign  . BREAST CYST EXCISION Left   . c section     x2  . CHOLECYSTECTOMY    . FOOT SURGERY Bilateral   . KNEE ARTHROSCOPY Left 07/18/2016   Procedure: ARTHROSCOPY KNEE;  Surgeon: Donato HeinzJames P Hooten, MD;  Location: [facility] ORS;  Service: Orthopedics;  Laterality: Left;  . KNEE ARTHROSCOPY WITH LATERAL MENISECTOMY Left 07/18/2016   Procedure: KNEE ARTHROSCOPY WITH PARTIAL  LATERAL MENISECTOMY, CHONDROPLASTY LATERAL FEMORAL CONDYLE;  Surgeon: Donato HeinzJames P Hooten, MD;  Location: [facility] ORS;  Service: Orthopedics;  Laterality: Left;  . SPINE SURGERY  11/09/11   C5  . TUBAL LIGATION      There were no vitals filed for this visit.      Subjective Assessment - 07/11/17 1608    Subjective Patient reports no current pain in her arm and states her radiating symptoms are decreasing. Patient reports she continues to have symptoms along her first digit on the affected side.    Pertinent History PMH: previous LBP, ACDF C6-7, HTN,  diabetes, stage III renal failue   Limitations Lifting;Other (comment)  bending   Patient Stated Goals To decrease pain   Currently in Pain? No/denies   Pain Onset 1 to 4 weeks ago      TREATMENT Therapeutic Exercise: Scapular retraction at [facility]  -- x20 20# Straight arm push downs in sitting at [facility] - x 20  25#  Seated High Rows at [facility] - x 20 25#  Performing Shoulder ER with YTB around wrists - x 20  [facility] - [facility] 15# [facility]ated [facility] - [facility] 25  Straight [facility] - x[facility] #5  Patient demonstrates increased fatigue at the end of the session       PT Education - 07/11/17 1715    Education provided Yes   Education Details Educated on exercise performance and technique   Person(s) Educated Patient   Methods Explanation;Demonstration   Comprehension Verbalized understanding;Returned demonstration             PT Long Term Goals - 07/05/17 1726      PT LONG TERM GOAL #1   Title Patient will be independent with HEP to continue benefits of therapy after discharge   Baseline Dependent for exercise performance and progression; 07/05/17: moderate cueing for exercise performance.    Time 6  Period Weeks   Status On-going     PT LONG TERM GOAL #2   Title Patient will demonstrate improvement in L cervical rotation to equal the R side to allow for ease of performance of driving.    Baseline 5deg of L cerv. rotation; 07/05/2017 5 deg from R side   Time 6   Period Weeks   Status On-going     PT LONG TERM GOAL #3   Title Patient will improve NDI to under 20% to improve perceived cervical function and greater ability to perform dressing/washing activities.    Baseline 54% NDI; 07/05/17: 24%   Time 6   Period Weeks   Status On-going               Plan - 07/11/17 1717    Clinical Impression Statement Patient demonstrates continuous improvement in pain/symptoms with decreased radiating pain/numbness at rest. Patient  reports she continues to feel a "sharp" pain around her finger which decreased after performing radial nerve bias nerve tension. Patient will benefit from further skilled therapy to return to prior level of function.    Rehab Potential Good   Clinical Impairments Affecting Rehab Potential (-) lives alone, diabetes (+) highly motivated, family support   PT Frequency 2x / week   PT Duration 6 weeks   PT Treatment/Interventions Aquatic Therapy;Moist Heat;Iontophoresis 4mg /ml Dexamethasone;Biofeedback;Cryotherapy;Ultrasound;Therapeutic exercise;Therapeutic activities;Functional mobility training;Gait training;Stair training;Neuromuscular re-education;Patient/family education;Balance training;Taping;Manual techniques;Dry needling;Electrical Stimulation   PT Next Visit Plan Progress strengthening and motor coordination   PT Home Exercise Plan See education   Consulted and Agree with Plan of Care Patient      Patient will benefit from skilled therapeutic intervention in order to improve the following deficits and impairments:  Pain, Hypomobility, Decreased balance, Decreased mobility, Impaired sensation, Decreased coordination, Increased muscle spasms, Hypermobility, Decreased endurance, Decreased activity tolerance, Decreased range of motion  Visit Diagnosis: Cervicalgia  Acute pain of left shoulder  Pain in left arm     Problem List Patient Active Problem List   Diagnosis Date Noted  . Polypharmacy 06/30/2017  . Advanced care planning/counseling discussion 02/20/2017  . Risk for falls 02/20/2017  . Pain management 02/20/2017  . Insomnia 06/21/2016  . Elevated uric acid in blood 06/21/2016  . Recurrent boils 06/21/2016  . Pain in left knee 06/06/2016  . Allergic rhinitis 06/06/2016  . Abscess of vulva 06/06/2016  . Low back pain 06/06/2016  . Major depressive disorder, recurrent episode, moderate (HCC) 06/06/2016  . CKD (chronic kidney disease), stage III 06/06/2016  . Diabetes  (HCC) 05/25/2016  . Hypertension 05/25/2016  . RLS (restless legs syndrome) 05/25/2016  . Arthritis of hand, right 05/25/2016  . Sleep apnea 05/25/2016  . Prominent metatarsal head 01/01/2016  . Hammertoe 01/01/2016  . Numbness and tingling 09/08/2015  . 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    Myrene Galas, PT DPT 07/11/2017, 5:27 PM  [place] [facility] PHYSICAL AND SPORTS MEDICINE [address] [place], [address] Phone: [phone]   Fax:  [phone]  Name: Kim Thomas MRN: [id number] Date of Birth: [birth year]

## 2017-07-13 ENCOUNTER — Ambulatory Visit: Payer: Medicare Other

## 2017-07-13 DIAGNOSIS — M542 Cervicalgia: Secondary | ICD-10-CM

## 2017-07-13 DIAGNOSIS — M79602 Pain in left arm: Secondary | ICD-10-CM

## 2017-07-13 DIAGNOSIS — M25512 Pain in left shoulder: Secondary | ICD-10-CM

## 2017-07-13 NOTE — Therapy (Signed)
[place] PHYSICAL AND SPORTS MEDICINE [address], [place], [address] Phone: [phone]   Fax:  [phone]  Physical Therapy Treatment  Patient Details  Name: Kim Thomas MRN: [id number] Date of Birth: 29-Jun-1956 Referring Provider: Kathrine Haddock   Encounter Date: 07/13/2017      PT End of Session - 07/13/17 1702    Visit Number 12   Number of Visits 21   Date for PT Re-Evaluation 08/02/17   Authorization Type 2/ 10 G Code   PT Start Time 1779   PT Stop Time 1740   PT Time Calculation (min) 45 min   Activity Tolerance Patient tolerated treatment well   Behavior During Therapy [facility] for tasks assessed/performed      Past Medical History:  Diagnosis Date  . Anxiety   . Chronic kidney disease   . DDD (degenerative disc disease), cervical   . Diabetes mellitus without complication ([place])   . Gout   . Hypertension   . Sleep apnea     Past Surgical History:  Procedure Laterality Date  . BREAST BIOPSY Left    benign  . BREAST CYST EXCISION Left   . c section     x2  . CHOLECYSTECTOMY    . FOOT SURGERY Bilateral   . KNEE ARTHROSCOPY Left 07/18/2016   Procedure: ARTHROSCOPY KNEE;  Surgeon: Dereck Leep, MD;  Location: [facility] ORS;  Service: Orthopedics;  Laterality: Left;  . KNEE ARTHROSCOPY WITH LATERAL MENISECTOMY Left 07/18/2016   Procedure: KNEE ARTHROSCOPY WITH PARTIAL  LATERAL MENISECTOMY, CHONDROPLASTY LATERAL FEMORAL CONDYLE;  Surgeon: Dereck Leep, MD;  Location: [facility] OR[facility];  Service: Orthopedics;  Laterality: Left;  . SPINE SURGERY  11/09/11   C5  . TUBAL LIGATION      There were no vitals filed for this visit.      Subjective Assessment - 07/13/17 1658    Subjective Patient reports no current pain in the arm but reports she still has very minor symptoms when moving her arm "a certain way". Patient reports this will go away with time and after performing the exercises. Patient reports she is prepared to be  discharged.     Pertinent History PMH: previous LBP, ACDF C6-7, HTN, diabetes, stage III renal failue   Limitations Lifting;Other (comment)  bending   Patient Stated Goals To decrease pain   Currently in Pain? No/denies   Pain Onset 1 to 4 weeks ago        TREATMENT: Therapeutic Exercise: Chest Press seated at [facility] - 2 x 10 15# Seated Scapular retraction at [place] -- x30 20# Bilateral shoulder ER in standing with BTB - x 30  Scapular retraction in standing with BTB - x 30 Scapular high row in standing - x 30 with BTB Seated thoracic extension over a towel - x 20 with 3 sec holds Isometric cervical isometrics in sitting - x 15 B Serratus punches with BTB in standing - x 30 Wall angels in standing, standing with back from wall - x 20  Facing the wall shoulder flexion with BTB around wrists - x 20  Patient demonstrates decreased pain at the end of session        PT Education - 07/13/17 1701    Education provided Yes   Education Details Reviewed HEP    Person(s) Educated Patient   Methods Explanation;Demonstration   Comprehension Verbalized understanding;Returned demonstration             PT Long Term Goals -  07/13/17 1708      PT LONG TERM GOAL #1   Title Patient will be independent with HEP to continue benefits of therapy after discharge   Baseline Dependent for exercise performance and progression; 07/05/17: moderate cueing for exercise performance. 07/13/17: independent with exercise performance   Time 6   Period Weeks   Status Achieved     PT LONG TERM GOAL #2   Title Patient will demonstrate improvement in L cervical rotation to equal the R side to allow for ease of performance of driving.    Baseline 5deg of L cerv. rotation; 07/05/2017 5 deg from R side; 07/13/2017: WNL on both side   Time 6   Period Weeks   Status Achieved     PT LONG TERM GOAL #3   Title Patient will improve NDI to under 20% to improve perceived cervical function and greater ability to  perform dressing/washing activities.    Baseline 54% NDI; 07/05/17: 24%; 07/13/17: 18%   Time 6   Period Weeks   Status Achieved               Plan - 07/13/17 1728    Clinical Impression Statement Patient demonstrates improvement with QuickDASH, exercise performance, and radiating symptoms into her arm. Patient required little to no instruction with exercise performance indicating increased independence. Patient has met all long term goals and is to be discharged from PT.   Rehab Potential Good   Clinical Impairments Affecting Rehab Potential (-) lives alone, diabetes (+) highly motivated, family support   PT Frequency 2x / week   PT Duration 6 weeks   PT Treatment/Interventions Aquatic Therapy;Moist Heat;Iontophoresis 57m/ml Dexamethasone;Biofeedback;Cryotherapy;Ultrasound;Therapeutic exercise;Therapeutic activities;Functional mobility training;Gait training;Stair training;Neuromuscular re-education;Patient/family education;Balance training;Taping;Manual techniques;Dry needling;Electrical Stimulation   PT Next Visit Plan Progress strengthening and motor coordination   PT Home Exercise Plan See education   Consulted and Agree with Plan of Care Patient      Patient will benefit from skilled therapeutic intervention in order to improve the following deficits and impairments:  Pain, Hypomobility, Decreased balance, Decreased mobility, Impaired sensation, Decreased coordination, Increased muscle spasms, Hypermobility, Decreased endurance, Decreased activity tolerance, Decreased range of motion  Visit Diagnosis: Cervicalgia  Acute pain of left shoulder  Pain in left arm     Problem List Patient Active Problem List   Diagnosis Date Noted  . Polypharmacy 06/30/2017  . Advanced care planning/counseling discussion 02/20/2017  . Risk for falls 02/20/2017  . Pain management 02/20/2017  . Insomnia 06/21/2016  . Elevated uric acid in blood 06/21/2016  . Recurrent boils 06/21/2016  .  Pain in left knee 06/06/2016  . Allergic rhinitis 06/06/2016  . Abscess of vulva 06/06/2016  . Low back pain 06/06/2016  . Major depressive disorder, recurrent episode, moderate ([place] 06/06/2016  . CKD (chronic kidney disease), stage III 06/06/2016  . Diabetes ([place] 05/25/2016  . Hypertension 05/25/2016  . RLS (restless legs syndrome) 05/25/2016  . Arthritis of hand, right 05/25/2016  . Sleep apnea 05/25/2016  . Prominent metatarsal head 01/01/2016  . Hammertoe 01/01/2016  . Numbness and tingling 09/08/2015  . 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    WBlythe Stanford PT DPT 07/13/2017, 5:42 PM  [facility] [address] [place] [address]Phone: [phone]  Fax:  [phone] Name: ALateefah MalleryMRN: [id number] of Birth: [birth year]

## 2017-07-17 ENCOUNTER — Telehealth: Payer: Self-pay | Admitting: Unknown Physician Specialty

## 2017-07-17 NOTE — Telephone Encounter (Signed)
Patient dropped off a bill she received stating she does not owe it since it was annual wellness check.  Placed in Katina's basket for review.  Thank You

## 2017-08-01 NOTE — Telephone Encounter (Signed)
Sending a second time to [place] in case she did not receive the first time  Just FYI

## 2017-08-17 ENCOUNTER — Other Ambulatory Visit: Payer: Self-pay | Admitting: Unknown Physician Specialty

## 2017-08-17 NOTE — Telephone Encounter (Signed)
Your patient 

## 2017-08-23 NOTE — Telephone Encounter (Signed)
Sent request to Belva Bertin with [employer] requesting charge be voided due to C3 scheduling error.

## 2017-09-13 ENCOUNTER — Ambulatory Visit: Payer: Medicare Other | Attending: Nurse Practitioner | Admitting: Physical Therapy

## 2017-09-13 ENCOUNTER — Encounter: Payer: Self-pay | Admitting: Physical Therapy

## 2017-09-13 DIAGNOSIS — R262 Difficulty in walking, not elsewhere classified: Secondary | ICD-10-CM | POA: Insufficient documentation

## 2017-09-13 DIAGNOSIS — M6281 Muscle weakness (generalized): Secondary | ICD-10-CM | POA: Diagnosis present

## 2017-09-13 DIAGNOSIS — M25562 Pain in left knee: Secondary | ICD-10-CM | POA: Insufficient documentation

## 2017-09-14 NOTE — Therapy (Signed)
[place] [facility]ICINE [address] [place], [address] Phone: [phone]   Fax:  [phone]  Physical Therapy Evaluation  Patient Details  Name: Kim Thomas MRN: [id number] Date of Birth: 1956/05/27 Referring Provider: Latanya Maudlin, PA  Encounter Date: 09/13/2017      PT End of Session - 09/13/17 1600    Visit Number 1   Number of Visits 12   Date for PT Re-Evaluation 10/25/17   Authorization Type 1   Authorization Time Period 10 (G code)   PT Start Time 1330   PT Stop Time 1430   PT Time Calculation (min) 60 min   Activity Tolerance Patient tolerated treatment well   Behavior During Therapy [facility] for tasks assessed/performed      Past Medical History:  Diagnosis Date  . Anxiety   . Chronic kidney disease   . DDD (degenerative disc disease), cervical   . Diabetes mellitus without complication (HCC)   . Gout   . Hypertension   . Sleep apnea     Past Surgical History:  Procedure Laterality Date  . BREAST BIOPSY Left    benign  . BREAST CYST EXCISION Left   . c section     x2  . CHOLECYSTECTOMY    . FOOT SURGERY Bilateral   . KNEE ARTHROSCOPY Left 07/18/2016   Procedure: ARTHROSCOPY KNEE;  Surgeon: Donato Heinz, MD;  Location: [facility] OR[facility];  Service: Orthopedics;  Laterality: Left;  . KNEE ARTHROSCOPY WITH LATERAL MENISECTOMY Left 07/18/2016   Procedure: KNEE ARTHROSCOPY WITH PARTIAL  LATERAL MENISECTOMY, CHONDROPLASTY LATERAL FEMORAL CONDYLE;  Surgeon: Donato Heinz, MD;  Location: [facility] ORS;  Service: Orthopedics;  Laterality: Left;  . SPINE SURGERY  11/09/11   C5  . TUBAL LIGATION      There were no vitals filed for this visit.       Subjective Assessment - 09/13/17 1340    Subjective Patient reports she is noticing improvement in left knee following injection into knee 2 weeks ago. She has not fallen in past 2 weeks. She is having stiffness in left knee following postion changes.    Pertinent History PMH: previous LBP, ACDF C6-7, HTN, diabetes, stage III kidney disease, 07/2016 arthroscopy left knee and noticed worsening symptoms about a year later   Limitations Sitting;Standing;Walking;House hold activities   How long can you sit comfortably? without crossing legs: 15 min.   How long can you stand comfortably? 10 min.   How long can you walk comfortably? intermediate distances   Patient Stated Goals be able to walk for exercise without difficulty   Currently in Pain? Yes   Pain Score 3    Pain Location Knee   Pain Orientation Left   Pain Descriptors / Indicators Discomfort   Pain Type Chronic pain   Pain Onset More than a month ago   Pain Frequency Constant   Aggravating Factors  bending knee, sitting, standing    Pain Relieving Factors injection, ice, rest   Effect of Pain on Daily Activities slows her down with daily activities, has to rest during activities            [facility] PT Assessment - 09/13/17 1352      Assessment   Medical Diagnosis post traumatic osteoarthritis of left knee   Referring Provider Latanya Maudlin, PA   Onset Date/Surgical Date 07/29/16   Hand Dominance Right   Next MD Visit unknown    Prior Therapy Yes for  LBP and neck      Balance Screen   Has the patient fallen in the past 6 months Yes   How many times? 2   Has the patient had a decrease in activity level because of a fear of falling?  No   Is the patient reluctant to leave their home because of a fear of falling?  No     Home Environment   Living Environment Private residence   Living Arrangements Alone   Available Help at Discharge Family   Type of Home Apartment   Home Access Stairs to enter   Entrance Stairs-Number of Steps 4   Entrance Stairs-Rails Right;Left;Cannot reach both   Home Layout One level   Home Equipment Tub bench;Grab bars - toilet;Grab bars - tub/shower;Toilet riser;Walker - 2 wheels     Prior Function   Level of Independence Independent    Vocation On disability   Vocation Requirements N/A`   Leisure [place], walking, outdoor activities, sight-seeing     Cognition   Overall Cognitive Status Within Functional Limits for tasks assessed     Observation/Other Assessments   Observations --   Other Surveys  Other Surveys   Neck Disability Index  --   Lower Extremity Functional Scale  42/80     Sensation   Light Touch Appears Intact   Light Touch Impaired Details --     Posture/Postural Control   Posture Comments --     ROM / Strength   AROM / PROM / Strength AROM;Strength          AROM: left LE: hip, knee, ankle WFL all planes Strength: RIght LE  Hip flexion 4/5, abduction 4/5, extension 4/5, knee extension 5/5, flexion 4/5 Left LE hip flexion 4-/5, abduction 3+/5, extension 3/5, knee extension 4-/5, flexion 4-/5 5x sit to stand 13.8 seconds Gait: ambulating independently without AD with decreased trunk rotation, decreased hip extension, push off   Objective measurements completed on examination: See above findings.         PT Education - 09/13/17 1500    Education provided Yes   Education Details POC: home program; hip adduction with ball and glute sets, hip abduction with red resistive band   Person(s) Educated Patient   Methods Explanation;Demonstration;Verbal cues;Handout   Comprehension Verbalized understanding;Returned demonstration;Verbal cues required             PT Long Term Goals - 09/14/17 2052      PT LONG TERM GOAL #1   Title patient will demonstrate independence with home program for pain control and progressive exercise to allow transition to self managment once discharged from physical therapy   Baseline requires cuing  and has limited knowledge of appropriate exercises and progression   Status New   Target Date 10/25/17     PT LONG TERM GOAL #2   Title improved LEFS to 55/80 or better indicating improvement in function with left LE for daily activities   Baseline LEFS 42/80    Status New   Target Date 10/25/17                Plan - 09/13/17 1440    Clinical Impression Statement Patient is a 61 year old female who presents with left knee pain with decreased strength and functional limitations involving standing and walking. Her LEFS score of 42/80 indicates moderate self perceived disability. She has limited knowledge of approporiate pain control strategies and exercise progression and will benefit from physical therapy intervention in order to achieve  maximal function.     History and Personal Factors relevant to plan of care: diabetes, HTN, back and neck pain, worsening left knee pain over the past few months limiting ability to walk, stand   Clinical Presentation Evolving   Clinical Presentation due to: worsening symptom and decreased function with standing, walking   Clinical Decision Making Moderate   Rehab Potential Good   Clinical Impairments Affecting Rehab Potential (-) lives alone, diabetes, chronic condition, arthritis (+) highly motivated, family support   PT Frequency 2x / week   PT Duration 6 weeks   PT Treatment/Interventions Electrical Stimulation;Cryotherapy;Moist Heat;Gait training;Patient/family education;Neuromuscular re-education;Therapeutic exercise;Balance training;Manual techniques   PT Next Visit Plan pain control, muscle re education, therapeutic exercise    PT Home Exercise Plan stabilization in sitting for hip adduction with ball and glute sets, hip abduction with resistive band   Consulted and Agree with Plan of Care Patient      Patient will benefit from skilled therapeutic intervention in order to improve the following deficits and impairments:  Decreased strength, Pain, Impaired perceived functional ability, Decreased activity tolerance, Decreased endurance, Difficulty walking  Visit Diagnosis: Muscle weakness (generalized) - Plan: PT plan of care cert/re-cert  Left knee pain, unspecified chronicity - Plan: PT plan of care  cert/re-cert  Difficulty in walking, not elsewhere classified - Plan: PT plan of care cert/re-cert      G-Codes - 09/13/17 1512    Functional Assessment Tool Used (Outpatient Only) LEFS, 5x sit to stand, strength, pain, clinical judgment   Functional Limitation Mobility: Walking and moving around   Mobility: Walking and Moving Around Current Status ([id number]) At least 40 percent but less than 60 percent impaired, limited or restricted   Mobility: Walking and Moving Around Goal Status [phone]([id number]) At least 1 percent but less than 20 percent impaired, limited or restricted       Problem List Patient Active Problem List   Diagnosis Date Noted  . Polypharmacy 06/30/2017  . Advanced care planning/counseling discussion 02/20/2017  . Risk for falls 02/20/2017  . Pain management 02/20/2017  . Insomnia 06/21/2016  . Elevated uric acid in blood 06/21/2016  . Recurrent boils 06/21/2016  . Pain in left knee 06/06/2016  . Allergic rhinitis 06/06/2016  . Abscess of vulva 06/06/2016  . Low back pain 06/06/2016  . Major depressive disorder, recurrent episode, moderate (HCC) 06/06/2016  . CKD (chronic kidney disease), stage III (HCC) 06/06/2016  . Diabetes (HCC) 05/25/2016  . Hypertension 05/25/2016  . RLS (restless legs syndrome) 05/25/2016  . Arthritis of hand, right 05/25/2016  . Sleep apnea 05/25/2016  . Prominent metatarsal head 01/01/2016  . Hammertoe 01/01/2016  . Numbness and tingling 09/08/2015  . 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 [place], [place], [address] Phone: [phone]   Fax:  [phone]  Name: Kim Thomas MRN: [id number] Date of Birth: January 19, 1956

## 2017-09-18 ENCOUNTER — Ambulatory Visit: Payer: Medicare Other | Admitting: Physical Therapy

## 2017-09-18 ENCOUNTER — Encounter: Payer: Self-pay | Admitting: Physical Therapy

## 2017-09-18 DIAGNOSIS — M6281 Muscle weakness (generalized): Secondary | ICD-10-CM

## 2017-09-18 DIAGNOSIS — R262 Difficulty in walking, not elsewhere classified: Secondary | ICD-10-CM

## 2017-09-18 DIAGNOSIS — M25562 Pain in left knee: Secondary | ICD-10-CM

## 2017-09-18 NOTE — Therapy (Signed)
[facility] [address], [place], [address] Phone: [phone]   Fax:  [phone]  Physical Therapy Treatment  Patient Details  Name: Kim Thomas MRN: [id number] Date of Birth: Oct 04, 1956 Referring Provider: Latanya Maudlin, PA  Encounter Date: 09/18/2017      PT End of Session - 09/18/17 1536    Visit Number 2   Number of Visits 12   Date for PT Re-Evaluation 10/25/17   Authorization Type 2   Authorization Time Period 10 (G code)   PT Start Time 1530   PT Stop Time 1613   PT Time Calculation (min) 43 min   Activity Tolerance Patient tolerated treatment well   Behavior During Therapy [facility] for tasks assessed/performed      Past Medical History:  Diagnosis Date  . Anxiety   . Chronic kidney disease   . DDD (degenerative disc disease), cervical   . Diabetes mellitus without complication (HCC)   . Gout   . Hypertension   . Sleep apnea     Past Surgical History:  Procedure Laterality Date  . BREAST BIOPSY Left    benign  . BREAST CYST EXCISION Left   . c section     x2  . CHOLECYSTECTOMY    . FOOT SURGERY Bilateral   . KNEE ARTHROSCOPY Left 07/18/2016   Procedure: ARTHROSCOPY KNEE;  Surgeon: Donato Heinz, MD;  Location: [facility];  Service: Orthopedics;  Laterality: Left;  . KNEE ARTHROSCOPY WITH LATERAL MENISECTOMY Left 07/18/2016   Procedure: KNEE ARTHROSCOPY WITH PARTIAL  LATERAL MENISECTOMY, CHONDROPLASTY LATERAL FEMORAL CONDYLE;  Surgeon: Donato Heinz, MD;  Location: [facility];  Service: Orthopedics;  Laterality: Left;  . SPINE SURGERY  11/09/11   C5  . TUBAL LIGATION      There were no vitals filed for this visit.      Subjective Assessment - 09/18/17 1532    Subjective Patient reports she is noticing pain in left knee and did try her TENS unit with mixed results with less feeling on lateral aspect of left knee.   Pertinent History PMH: previous LBP, ACDF C6-7, HTN, diabetes,  stage III kidney disease, 07/2016 arthroscopy left knee and noticed worsening symptoms about a year later   Limitations Sitting;Standing;Walking;House hold activities   How long can you sit comfortably? without crossing legs: 15 min.   How long can you stand comfortably? 10 min.   How long can you walk comfortably? intermediate distances   Patient Stated Goals be able to walk for exercise without difficulty   Currently in Pain? Yes   Pain Score 6    Pain Location Knee   Pain Orientation Left;Medial   Pain Descriptors / Indicators Discomfort;Aching   Pain Type Chronic pain   Pain Onset More than a month ago   Pain Frequency Constant      Objective: Palpation: left knee point tender over portal incisions, decreased patellar mobility Quadriceps contraction left with decreased control and unable to attain full contraction  Treatment: Modalities: Electrical stimulation: 20 min.Russian stim. 10/10 cycle applied (2) electrodes to quadriceps/VMO left LE with patient reclined with LE supported on pillow; pt. Performing quad sets/knee extension with each cycle; goal muscle re education High volt estim.clincial program for muscle spasms  (2) electrodes applied to medial and lateral aspect of left knee intensity to tolerance with patient in reclined position with LE supported on pillow goal: pain, spasms  Ultrasound: 100% 1.1w/cm2 x 10 min. To  left knee inferior aspect over scar tissue; goal pain ,improved soft tissue elasticity  Manual therapy: 8 min. Goal: improve soft tissue elasticity, pain  STM superficial techniques left knee with concentration over incisions inferior to patella, patient supine with left LE supported by pillow   Patient response to treatment: improved soft tissue elasticity and decreased tenderness by 50% following treatment. Improved quadriceps control following estim. Improved gait pattern with less pain to 3/10 in knee reported         PT Education - 09/18/17  1600    Education provided Yes   Education Details re assessed  home program; instructed in use of electrical stimulation and US   Person(s) Educated Patient   Methods Explanation;Demonstration;Verbal cues   Comprehension Verbalized understanding;Returned demonstration;Verbal cues required             PT Long Term Goals - 09/14/17 2052      PT LONG TERM GOAL #1   Title patient will demonstrate indpendence with home program for pain control and progressive exercise to allow transition to self managment once discharged from physical therapy   Baseline requires cuing  and has limited knowledge of appropriate exercises and progression   Status New   Target Date 10/25/17     PT LONG TERM GOAL #2   Title improved LEFS to 55/80 or better indicating improvement in function with left LE for daily activities   Baseline LEFS 42/80   Status New   Target Date 10/25/17               Plan - 09/18/17 1613    Clinical Impression Statement patient tolerated US and electrical stimulation well as demonstrated at end of session with patient ablet to stand and walk with less pain by 50% and able to perform isolated quadriceps contraction.    Rehab Potential Good   Clinical Impairments Affecting Rehab Potential (-) lives alone, diabetes, chronic condition, arthritis (+) highly motivated, family support   PT Frequency 2x / week   PT Duration 6 weeks   PT Treatment/Interventions Electrical Stimulation;Cryotherapy;Moist Heat;Gait training;Patient/family education;Neuromuscular re-education;Therapeutic exercise;Balance training;Manual techniques   PT Next Visit Plan pain control, muscle re education, therapeutic exercise    PT Home Exercise Plan stabilization in sitting for hip adduction with ball and glute sets, hip abduction with resistive band      Patient will benefit from skilled therapeutic intervention in order to improve the following deficits and impairments:  Decreased strength, Pain,  Impaired perceived functional ability, Decreased activity tolerance, Decreased endurance, Difficulty walking  Visit Diagnosis: Muscle weakness (generalized)  Left knee pain, unspecified chronicity  Difficulty in walking, not elsewhere classified     Problem List Patient Active Problem List   Diagnosis Date Noted  . Polypharmacy 06/30/2017  . Advanced care planning/counseling discussion 02/20/2017  . Risk for falls 02/20/2017  . Pain management 02/20/2017  . Insomnia 06/21/2016  . Elevated uric acid in blood 06/21/2016  . Recurrent boils 06/21/2016  . Pain in left knee 06/06/2016  . Allergic rhinitis 06/06/2016  . Abscess of vulva 06/06/2016  . Low back pain 06/06/2016  . Major depressive disorder, recurrent episode, moderate (HCC) 06/06/2016  . CKD (chronic kidney disease), stage III (HCC) 06/06/2016  . Diabetes (HCC) 05/25/2016  . Hypertension 05/25/2016  . RLS (restless legs syndrome) 05/25/2016  . Arthritis of hand, right 05/25/2016  . Sleep apnea 05/25/2016  . Prominent metatarsal head 01/01/2016  . Hammertoe 01/01/2016  . Numbness and tingling 09/08/2015  . Sensory ataxia  09/08/2015    Beacher May PT 09/19/2017, 9:47 PM  [facility] PHYSICAL AND SPORTS MEDICINE [address], [place], [address] Phone: [phone]   Fax:  [phone]  Name: Anara Cowman MRN: [id number] Date of Birth: 1956/02/05

## 2017-09-19 ENCOUNTER — Ambulatory Visit: Payer: Medicare Other | Admitting: Physical Therapy

## 2017-09-20 ENCOUNTER — Encounter: Payer: Self-pay | Admitting: Physical Therapy

## 2017-09-20 ENCOUNTER — Ambulatory Visit: Payer: Medicare Other | Admitting: Physical Therapy

## 2017-09-20 DIAGNOSIS — M25562 Pain in left knee: Secondary | ICD-10-CM

## 2017-09-20 DIAGNOSIS — R262 Difficulty in walking, not elsewhere classified: Secondary | ICD-10-CM

## 2017-09-20 DIAGNOSIS — M6281 Muscle weakness (generalized): Secondary | ICD-10-CM | POA: Diagnosis not present

## 2017-09-20 NOTE — Therapy (Signed)
[facility] [address] [place], [place], [address] Phone: [phone]   Fax:  [phone]  Physical Therapy Treatment  Patient Details  Name: Kim Thomas MRN: [id number] Date of Birth: [birth year] Referring Provider: Wolfe, Jon Richard, PA  Encounter Date: 09/20/2017      PT End of Session - 09/20/17 1446    Visit Number 3   Number of Visits 12   Date for PT Re-Evaluation 10/25/17   Authorization Type 3   Authorization Time Period 10 (G code)   PT Start Time 1445   PT Stop Time 1540   PT Time Calculation (min) 55 min   Activity Tolerance Patient tolerated treatment well   Behavior During Therapy WFL for tasks assessed/performed      Past Medical History:  Diagnosis Date  . Anxiety   . Chronic kidney disease   . DDD (degenerative disc disease), cervical   . Diabetes mellitus without complication (HCC)   . Gout   . Hypertension   . Sleep apnea     Past Surgical History:  Procedure Laterality Date  . BREAST BIOPSY Left    benign  . BREAST CYST EXCISION Left   . c section     x2  . CHOLECYSTECTOMY    . FOOT SURGERY Bilateral   . KNEE ARTHROSCOPY Left 07/18/2016   Procedure: ARTHROSCOPY KNEE;  Surgeon: James P Hooten, MD;  Location: [facility];  Service: Orthopedics;  Laterality: Left;  . KNEE ARTHROSCOPY WITH LATERAL MENISECTOMY Left 07/18/2016   Procedure: KNEE ARTHROSCOPY WITH PARTIAL  LATERAL MENISECTOMY, CHONDROPLASTY LATERAL FEMORAL CONDYLE;  Surgeon: James P Hooten, MD;  Location: [facility];  Service: Orthopedics;  Laterality: Left;  . SPINE SURGERY  11/09/11   C5  . TUBAL LIGATION      There were no vitals filed for this visit.      Subjective Assessment - 09/20/17 1445    Subjective Patient reports she noticed a difference in less knee stiffness and pain follwoing US and estim. and would like to continue with this treatment and also with patella mobilizer.   Pertinent History PMH:  previous LBP, ACDF C6-7, HTN, diabetes, stage III kidney disease, 07/2016 arthroscopy left knee and noticed worsening symptoms about a year later   Limitations Sitting;Standing;Walking;House hold activities   How [place] [id number] sit comfortably? without crossing legs: 15 min.   [place] tissue elasticity  NuStep x 10 min independent  level #5 (unbilled time)   Patient response to treatment: Patient demonstrated improved soft tissue elasticity and decreased pain, soreness with walking. Patient performed NuStep without pain.             PT Education - 09/20/17 1523    Education provided Yes   Education Details exercise instruction   Person(s) Educated Patient   Methods Explanation;Verbal cues;Demonstration   Comprehension Verbalized understanding;Verbal cues required;Returned demonstration              PT Long Term Goals - 09/14/17 2052      PT LONG TERM GOAL #1   Title patient will demonstrate indpendence with home program for pain control and progressive exercise to allow transition to self managment once discharged from physical therapy   Baseline requires cuing  and has limited knowledge of appropriate exercises and progression   Status New   Target Date 10/25/17     PT LONG TERM GOAL #2   Title improved LEFS to 55/80 or better indicating improvement in function with left LE for daily activities   Baseline LEFS 42/80   Status New   Target Date 10/25/17               Plan - 09/20/17 1530    Clinical Impression Statement Patient demonstrates less stiffness and pain in her knee following modalities which allow her to ambulate with less difficulty and activate her quadriceps more fully. She will conitnue to benefit from physical therapy intervention to improve ROM, strength and function with daily activities.    Rehab Potential Good   Clinical Impairments Affecting Rehab Potential (-) lives alone, diabetes, chronic condition, arthritis (+) highly motivated, family support   PT Frequency 2x / week   PT Duration 6 weeks   PT Treatment/Interventions Electrical Stimulation;Cryotherapy;Moist Heat;Gait training;Patient/family education;Neuromuscular re-education;Therapeutic exercise;Balance training;Manual techniques   PT Next Visit Plan pain control, muscle re education, therapeutic exercise    PT Home Exercise Plan stabilization in sitting for hip adduction with ball and glute sets, hip abduction with resistive band      Patient will benefit from skilled therapeutic intervention in order to improve the following deficits and impairments:  Decreased strength, Pain, Impaired perceived functional ability, Decreased activity tolerance, Decreased endurance, Difficulty walking  Visit Diagnosis: Muscle weakness (generalized)  Left knee pain, unspecified chronicity  Difficulty in  walking, not elsewhere classified     Problem List Patient Active Problem List   Diagnosis Date Noted  . Polypharmacy 06/30/2017  . Advanced care planning/counseling discussion 02/20/2017  . Risk for falls 02/20/2017  . Pain management 02/20/2017  . Insomnia 06/21/2016  . Elevated uric acid in blood 06/21/2016  . Recurrent boils 06/21/2016  . Pain in left knee 06/06/2016  . Allergic rhinitis 06/06/2016  . Abscess of vulva 06/06/2016  . Low back pain 06/06/2016  . Major depressive disorder, recurrent episode, moderate (HCC) 06/06/2016  . CKD (chronic kidney disease), stage III (HCC) 06/06/2016  . Diabetes (HCC) 05/25/2016  . Hypertension 05/25/2016  . RLS (restless legs syndrome) 05/25/2016  . Arthritis of hand, right 05/25/2016  . Sleep apnea 05/25/2016  . Prominent metatarsal head 01/01/2016  . Hammertoe 01/01/2016  . Numbness and tingling 09/08/2015  . 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    Beacher MayBrooks, Marie PT 09/21/2017, 12:09 PM  [facility] [address] [place], [place], [address] Phone: [phone]   Fax:  [phone]  Name: Kim Thomas MRN: [id number] Date of Birth: October 12, 1956

## 2017-09-26 ENCOUNTER — Encounter: Payer: Self-pay | Admitting: Physical Therapy

## 2017-09-26 ENCOUNTER — Ambulatory Visit: Payer: Medicare Other | Admitting: Physical Therapy

## 2017-09-26 DIAGNOSIS — M25562 Pain in left knee: Secondary | ICD-10-CM

## 2017-09-26 DIAGNOSIS — M6281 Muscle weakness (generalized): Secondary | ICD-10-CM

## 2017-09-26 DIAGNOSIS — R262 Difficulty in walking, not elsewhere classified: Secondary | ICD-10-CM

## 2017-09-26 NOTE — Therapy (Signed)
[facility] [address] [place], [place], [address] Phone: [phone]   Fax:  [phone]  Physical Therapy Treatment  Patient Details  Name: Kim Thomas MRN: [id number] Date of Birth: 03-22-1956 Referring Provider: Latanya MaudlinWolfe, Jon Richard, PA  Encounter Date: 09/26/2017      PT End of Session - 09/26/17 1413    Visit Number 4   Number of Visits 12   Date for PT Re-Evaluation 10/25/17   Authorization Type 4   Authorization Time Period 10 (G code)   PT Start Time 1407   PT Stop Time 1500   PT Time Calculation (min) 53 min   Activity Tolerance Patient tolerated treatment well   Behavior [facility] Therapy [facility] for tasks assessed/performed      Past Medical History:  Diagnosis Date  . Anxiety   . Chronic kidney disease   . DDD (degenerative disc disease), cervical   . Diabetes mellitus without complication (HCC)   . Gout   . Hypertension   . Sleep apnea     Past Surgical History:  Procedure Laterality Date  . BREAST BIOPSY Left    benign  . BREAST CYST EXCISION Left   . c section     x2  . CHOLECYSTECTOMY    . FOOT SURGERY Bilateral   . KNEE ARTHROSCOPY Left 07/18/2016   Procedure: ARTHROSCOPY KNEE;  Surgeon: Donato HeinzJames P Hooten, MD;  Location: [facility];  Service: Orthopedics;  Laterality: Left;  . KNEE ARTHROSCOPY WITH LATERAL MENISECTOMY Left 07/18/2016   Procedure: KNEE ARTHROSCOPY WITH PARTIAL  LATERAL MENISECTOMY, CHONDROPLASTY LATERAL FEMORAL CONDYLE;  Surgeon: Donato HeinzJames P Hooten, MD;  Location: [facility];  Service: Orthopedics;  Laterality: Left;  . SPINE SURGERY  11/09/11   C5  . TUBAL LIGATION      There were no vitals filed for this visit.      Subjective Assessment - 09/26/17 1411    Subjective Patient reports she stumbled while walking on Saturday and reports increased soreness in her knee following NuStep   Pertinent History PMH: previous LBP, ACDF C6-7, HTN, diabetes, stage III kidney disease,  07/2016 arthroscopy left knee and noticed worsening symptoms about a year later   Limitations Sitting;Standing;Walking;House hold activities   How long can you sit comfortably? without crossing legs: 15 min.   How long can you stand comfortably? 10 min.   How long can you walk comfortably? intermediate distances   Patient Stated Goals be able to walk for exercise without difficulty   Currently in Pain? No/denies        Objective: Palpation: left knee point tender over portal incisions, decreased patellar mobility, much improved from previous session Quadriceps contraction left with improved control from previous session  Treatment: Modalities: Electrical stimulation: 20 min.Russian stim. 10/10 cycle applied (2) electrodes to quadriceps/VMO left LE with patient reclined with LE supported on pillow; pt. Performing quad sets/knee extension with each cycle; goal muscle re education High volt estim.clincial program for muscle spasms (2) electrodes applied to medial and lateral aspect of left kneeintensity to tolerance with patient in reclined position with LE supported on pillow goal: pain, spasms  Ultrasound: 3MHz  50% 1.1w/cm2 x 10 min. To left knee inferior aspect over scar tissue; goal pain ,improved soft tissue elasticity  Therapeutic exercise: patient performed with demonstration, VC, tactile cues of therapist: goal: independent with home program, improve LEFS, strength Leg press: bilateral LE's 45# 2 x 15; 25# x 25 reps Hip adduction with ball and  glute sets Hip abduction with resistive band re assessed  NuStep x 5 min independent level #3 (unbilled time); at end of session  Patient response to treatment: Patient demonstrated improved motor control and strength with exercises with repetition, no reports of pain in left knee with modification of intensity and time limit on NuStep. Good pain control and muscle re education with modalities.       PT Education - 09/26/17 1413     Education provided Yes   Education Details exercise instruction   Person(s) Educated Patient   Methods Explanation;Demonstration;Verbal cues   Comprehension Verbalized understanding;Returned demonstration;Verbal cues required             PT Long Term Goals - 09/14/17 2052      PT LONG TERM GOAL #1   Title patient will demonstrate indpendence with home program for pain control and progressive exercise to allow transition to self managment once discharged from physical therapy   Baseline requires cuing  and has limited knowledge of appropriate exercises and progression   Status New   Target Date 10/25/17     PT LONG TERM GOAL #2   Title improved LEFS to 55/80 or better indicating improvement in function with left LE for daily activities   Baseline LEFS 42/80   Status New   Target Date 10/25/17               Plan - 09/26/17 1500    Clinical Impression Statement Patient with improvement noted in left knee with modalities. She continues with functional limitations due to decreased strength and increased pain in left LE /knee and should continue to improve function with additional physical therapy intervention.   Rehab Potential Good   Clinical Impairments Affecting Rehab Potential (-) lives alone, diabetes, chronic condition, arthritis (+) highly motivated, family support   PT Frequency 2x / week   PT Duration 6 weeks   PT Treatment/Interventions Electrical Stimulation;Cryotherapy;Moist Heat;Gait training;Patient/family education;Neuromuscular re-education;Therapeutic exercise;Balance training;Manual techniques   PT Next Visit Plan pain control, muscle re education, therapeutic exercise    PT Home Exercise Plan stabilization in sitting for hip adduction with ball and glute sets, hip abduction with resistive band      Patient will benefit from skilled therapeutic intervention in order to improve the following deficits and impairments:  Decreased strength, Pain, Impaired  perceived functional ability, Decreased activity tolerance, Decreased endurance, Difficulty walking  Visit Diagnosis: Muscle weakness (generalized)  Left knee pain, unspecified chronicity  Difficulty in walking, not elsewhere classified     Problem List Patient Active Problem List   Diagnosis Date Noted  . Polypharmacy 06/30/2017  . Advanced care planning/counseling discussion 02/20/2017  . Risk for falls 02/20/2017  . Pain management 02/20/2017  . Insomnia 06/21/2016  . Elevated uric acid in blood 06/21/2016  . Recurrent boils 06/21/2016  . Pain in left knee 06/06/2016  . Allergic rhinitis 06/06/2016  . Abscess of vulva 06/06/2016  . Low back pain 06/06/2016  . Major depressive disorder, recurrent episode, moderate (HCC) 06/06/2016  . CKD (chronic kidney disease), stage III (HCC) 06/06/2016  . Diabetes (HCC) 05/25/2016  . Hypertension 05/25/2016  . RLS (restless legs syndrome) 05/25/2016  . Arthritis of hand, right 05/25/2016  . Sleep apnea 05/25/2016  . Prominent metatarsal head 01/01/2016  . Hammertoe 01/01/2016  . Numbness and tingling 09/08/2015  . 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    Beacher May PT 09/27/2017, 12:29 PM  [facility] PHYSICAL AND SPORTS MEDICINE [address] [place], [place], [address] Phone: [phone]   Fax:  [phone]  Name: Kim Thomas MRN: [id number] Date of Birth: 13-Jul-1956

## 2017-09-28 ENCOUNTER — Ambulatory Visit: Payer: Medicare Other | Attending: Nurse Practitioner | Admitting: Physical Therapy

## 2017-09-28 ENCOUNTER — Encounter: Payer: Self-pay | Admitting: Physical Therapy

## 2017-09-28 DIAGNOSIS — M25562 Pain in left knee: Secondary | ICD-10-CM | POA: Diagnosis present

## 2017-09-28 DIAGNOSIS — R262 Difficulty in walking, not elsewhere classified: Secondary | ICD-10-CM | POA: Insufficient documentation

## 2017-09-28 DIAGNOSIS — M6281 Muscle weakness (generalized): Secondary | ICD-10-CM | POA: Insufficient documentation

## 2017-09-29 NOTE — Therapy (Signed)
[place] [facility] [address], [place], [address] Phone: [phone]   Fax:  [phone]  Physical Therapy Treatment  Patient Details  Name: Kim Thomas MRN: [id number] Date of Birth: Feb 24, 1956 Referring Provider: Latanya Maudlin, PA  Encounter Date: 09/28/2017      PT End of Session - 09/28/17 1431    Visit Number 5   Number of Visits 12   Date for PT Re-Evaluation 10/25/17   Authorization Type 5   Authorization Time Period 10 (G code)   PT Start Time 1421   PT Stop Time 1515   PT Time Calculation (min) 54 min   Activity Tolerance Patient tolerated treatment well   Behavior During Therapy [facility] for tasks assessed/performed      Past Medical History:  Diagnosis Date  . Anxiety   . Chronic kidney disease   . DDD (degenerative disc disease), cervical   . Diabetes mellitus without complication (HCC)   . Gout   . Hypertension   . Sleep apnea     Past Surgical History:  Procedure Laterality Date  . BREAST BIOPSY Left    benign  . BREAST CYST EXCISION Left   . c section     x2  . CHOLECYSTECTOMY    . FOOT SURGERY Bilateral   . KNEE ARTHROSCOPY Left 07/18/2016   Procedure: ARTHROSCOPY KNEE;  Surgeon: Donato Heinz, MD;  Location: [facility] ORS;  Service: Orthopedics;  Laterality: Left;  . KNEE ARTHROSCOPY WITH LATERAL MENISECTOMY Left 07/18/2016   Procedure: KNEE ARTHROSCOPY WITH PARTIAL  LATERAL MENISECTOMY, CHONDROPLASTY LATERAL FEMORAL CONDYLE;  Surgeon: Donato Heinz, MD;  Location: [facility] ORS;  Service: Orthopedics;  Laterality: Left;  . SPINE SURGERY  11/09/11   C5  . TUBAL LIGATION      There were no vitals filed for this visit.      Subjective Assessment - 09/28/17 1510    Subjective Patient reports feeling much better today with less knee pain and able to walk with much less difficulty. She is exercising as instructed. no problems reported from previous session.   Pertinent History PMH:  previous LBP, ACDF C6-7, HTN, diabetes, stage III kidney disease, 07/2016 arthroscopy left knee and noticed worsening symptoms about a year later   Limitations Sitting;Standing;Walking;House hold activities   How long can you sit comfortably? without crossing legs: 15 min.   How long can you stand comfortably? 10 min.   How long can you walk comfortably? intermediate distances   Patient Stated Goals be able to walk for exercise without difficulty   Currently in Pain? No/denies         Objective: Palpation: mild to no tenderness left knee over scars as compared to previous session Strength: decreased strength left LE quadriceps and hip musculature  Treatment: Modalities: Electrical stimulation: 20 min.Russian stim. 10/10 cycle applied (2) electrodes to quadriceps/VMO left LE with patient reclined with LE supported on pillow; pt. Performing quad sets/knee extension with each cycle; goal muscle re education High volt estim.clincial program for muscle spasms (2) electrodes applied to medial and lateral aspect of left kneeintensity to tolerance with patient in reclined position with LE supported on pillow goal: pain, spasms  Therapeutic exercise: patient performed with demonstration, VC, tactile cues of therapist: goal: independent with home program, improve LEFS, strength Leg press: bilateral LE's 45#  x 15; 25# x 25 reps to improve left LE activation (decreased intensity to 25# with increased muscle activation of left LE noted)  Hip adduction with ball and glute sets Hip abduction with resistive band bilateral x 20,  Single each LE x 10 reps with assistance of therapist Knee flexion 2 x 15 reps resistive band Ankle DF with red  Resistive band 2 x 15 Ankle PF off balance stones x 15 reps   NuStep x 7 min independent level #3 (unbilled time); at beginning of session  Patient response to treatment: Patient demonstrated improved motor control, strength left LE with repetition and  guidance,cuing. She demonstrated mild fatigue with exercises. Mild soreness left knee with exercises. Mild contraction noted with electrical stimulation.        PT Education - 09/28/17 1518    Education provided Yes   Education Details exercise instruction   Person(s) Educated Patient   Methods Explanation;Demonstration;Verbal cues   Comprehension Verbalized understanding;Returned demonstration;Verbal cues required             PT Long Term Goals - 09/14/17 2052      PT LONG TERM GOAL #1   Title patient will demonstrate indpendence with home program for pain control and progressive exercise to allow transition to self managment once discharged from physical therapy   Baseline requires cuing  and has limited knowledge of appropriate exercises and progression   Status New   Target Date 10/25/17     PT LONG TERM GOAL #2   Title improved LEFS to 55/80 or better indicating improvement in function with left LE for daily activities   Baseline LEFS 42/80   Status New   Target Date 10/25/17               Plan - 09/28/17 1520    Clinical Impression Statement Patient is progressing steadily towards goals. She continues with weakness and pain due to chronic condition left knee and will benefit from additional physical therapy intervention to achieve goals.   Rehab Potential Good   Clinical Impairments Affecting Rehab Potential (-) lives alone, diabetes, chronic condition, arthritis (+) highly motivated, family support   PT Frequency 2x / week   PT Duration 6 weeks   PT Treatment/Interventions Electrical Stimulation;Cryotherapy;Moist Heat;Gait training;Patient/family education;Neuromuscular re-education;Therapeutic exercise;Balance training;Manual techniques   PT Next Visit Plan pain control, muscle re education, therapeutic exercise    PT Home Exercise Plan stabilization in sitting for hip adduction with ball and glute sets, hip abduction with resistive band      Patient will  benefit from skilled therapeutic intervention in order to improve the following deficits and impairments:  Decreased strength, Pain, Impaired perceived functional ability, Decreased activity tolerance, Decreased endurance, Difficulty walking  Visit Diagnosis: Muscle weakness (generalized)  Left knee pain, unspecified chronicity  Difficulty in walking, not elsewhere classified     Problem List Patient Active Problem List   Diagnosis Date Noted  . Polypharmacy 06/30/2017  . Advanced care planning/counseling discussion 02/20/2017  . Risk for falls 02/20/2017  . Pain management 02/20/2017  . Insomnia 06/21/2016  . Elevated uric acid in blood 06/21/2016  . Recurrent boils 06/21/2016  . Pain in left knee 06/06/2016  . Allergic rhinitis 06/06/2016  . Abscess of vulva 06/06/2016  . Low back pain 06/06/2016  . Major depressive disorder, recurrent episode, moderate (HCC) 06/06/2016  . CKD (chronic kidney disease), stage III (HCC) 06/06/2016  . Diabetes (HCC) 05/25/2016  . Hypertension 05/25/2016  . RLS (restless legs syndrome) 05/25/2016  . Arthritis of hand, right 05/25/2016  . Sleep apnea 05/25/2016  . Prominent metatarsal head 01/01/2016  . Hammertoe 01/01/2016  .  Numbness and tingling 09/08/2015  . 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    Beacher May PT 09/29/2017, 3:24 PM  [facility] [address] [place], [address] Phone: [phone]   Fax:  [phone]  Name: Kim Thomas MRN: [id number] Date of Birth: August 20, 1956

## 2017-10-02 ENCOUNTER — Ambulatory Visit (INDEPENDENT_AMBULATORY_CARE_PROVIDER_SITE_OTHER): Payer: Medicare Other | Admitting: Unknown Physician Specialty

## 2017-10-02 ENCOUNTER — Encounter: Payer: Self-pay | Admitting: Unknown Physician Specialty

## 2017-10-02 VITALS — BP 116/73 | HR 85 | Temp 98.8°F | Wt 195.8 lb

## 2017-10-02 DIAGNOSIS — N183 Chronic kidney disease, stage 3 unspecified: Secondary | ICD-10-CM

## 2017-10-02 DIAGNOSIS — I10 Essential (primary) hypertension: Secondary | ICD-10-CM | POA: Diagnosis not present

## 2017-10-02 DIAGNOSIS — J0100 Acute maxillary sinusitis, unspecified: Secondary | ICD-10-CM | POA: Diagnosis not present

## 2017-10-02 DIAGNOSIS — E119 Type 2 diabetes mellitus without complications: Secondary | ICD-10-CM | POA: Diagnosis not present

## 2017-10-02 LAB — BAYER DCA HB A1C WAIVED: HB A1C (BAYER DCA - WAIVED): 6.5 % (ref <7.0)

## 2017-10-02 MED ORDER — AMOXICILLIN 875 MG PO TABS
875.0000 mg | ORAL_TABLET | Freq: Two times a day (BID) | ORAL | 0 refills | Status: DC
Start: 2017-10-02 — End: 2017-11-03

## 2017-10-02 NOTE — Assessment & Plan Note (Signed)
Hgb A1C is 6.5%.  Continue with Metformin

## 2017-10-02 NOTE — Assessment & Plan Note (Signed)
Stable, continue present medications.   

## 2017-10-02 NOTE — Progress Notes (Signed)
BP 116/73   Pulse 85   Temp 98.8 F (37.1 C) (Oral)   Wt 195 lb 12.8 oz (88.8 kg)   LMP  (LMP Unknown)   SpO2 97%   BMI 32.58 kg/m    Subjective:    Patient ID: Kim Thomas, female    DOB: 15-Jun-1956, 61 y.o.   MRN: [id number]  HPI: Kim Thomas is a 61 y.o. female  Chief Complaint  Patient presents with  . Diabetes    pt states she has not had a recent eye exam   . Hyperlipidemia  . Hypertension  . Allergies    pt states she wants to know if she can take claritin or would like a RX for something for allergies    Diabetes: Using medications without difficulties.  Excellent control. Taking Cinnamon and wondering if it is helping her control.  She feels it is doing well No hypoglycemic episodes No hyperglycemic episodes: Pt states she had a very high blood sugar and found her strips not being good Blood Sugars averaging: 120's eye exam within last year Last Hgb A1C:6.6  Hypertension  Using medications without difficulty Average home BPs   Using medication without problems or lightheadedness No chest pain with exertion or shortness of breath No Edema  Elevated Cholesterol Using medications without problems No Muscle aches  Diet: Exercise:  Sinus States she is having a lot of sinus drainage with facial pain and left ear.  States this has been going on for about 2 weeks.  States the drainage is not green and foul anymore  Relevant past medical, surgical, family and social history reviewed and updated as indicated. Interim medical history since our last visit reviewed. Allergies and medications reviewed and updated.  Review of Systems  Per HPI unless specifically indicated above     Objective:    BP 116/73   Pulse 85   Temp 98.8 F (37.1 C) (Oral)   Wt 195 lb 12.8 oz (88.8 kg)   LMP  (LMP Unknown)   SpO2 97%   BMI 32.58 kg/m   Wt Readings from Last 3 Encounters:  10/02/17 195 lb 12.8 oz (88.8 kg)  06/30/17 194 lb (88 kg)  06/02/17 193 lb 6.4 oz  (87.7 kg)    Physical Exam  Constitutional: She is oriented to person, place, and time. She appears well-developed and well-nourished. No distress.  HENT:  Head: Normocephalic and atraumatic.  Right Ear: Tympanic membrane and ear canal normal.  Left Ear: Tympanic membrane and ear canal normal.  Nose: No rhinorrhea. Right sinus exhibits maxillary sinus tenderness. Right sinus exhibits no frontal sinus tenderness. Left sinus exhibits maxillary sinus tenderness. Left sinus exhibits no frontal sinus tenderness.  Eyes: Conjunctivae and lids are normal. Right eye exhibits no discharge. Left eye exhibits no discharge. No scleral icterus.  Neck: Normal range of motion. Neck supple. No JVD present. Carotid bruit is not present.  Cardiovascular: Normal rate, regular rhythm and normal heart sounds.  Pulmonary/Chest: Effort normal and breath sounds normal. No respiratory distress.  Abdominal: Normal appearance. There is no splenomegaly or hepatomegaly.  Musculoskeletal: Normal range of motion.  Neurological: She is alert and oriented to person, place, and time.  Skin: Skin is warm, dry and intact. No rash noted. No pallor.  Psychiatric: She has a normal mood and affect. Her behavior is normal. Judgment and thought content normal.    Results for orders placed or performed in visit on 06/30/17  CBC with Differential/Platelet  Result Value Ref Range  WBC 6.8 3.4 - 10.8 x10E3/uL   RBC 3.90 3.77 - 5.28 x10E6/uL   Hemoglobin 11.8 11.1 - 15.9 g/dL   Hematocrit 16.1 09.6 - 46.6 %   MCV 93 79 - 97 fL   MCH 30.3 26.6 - 33.0 pg   MCHC 32.6 31.5 - 35.7 g/dL   RDW 04.5 40.9 - 81.1 %   Platelets 259 150 - 379 x10E3/uL   Neutrophils 57 Not Estab. %   Lymphs 34 Not Estab. %   Monocytes 8 Not Estab. %   Eos 1 Not Estab. %   Basos 0 Not Estab. %   Neutrophils Absolute 3.9 1.4 - 7.0 x10E3/uL   Lymphocytes Absolute 2.3 0.7 - 3.1 x10E3/uL   Monocytes Absolute 0.5 0.1 - 0.9 x10E3/uL   EOS (ABSOLUTE) 0.1 0.0 -  0.4 x10E3/uL   Basophils Absolute 0.0 0.0 - 0.2 x10E3/uL   Immature Granulocytes 0 Not Estab. %   Immature Grans (Abs) 0.0 0.0 - 0.1 x10E3/uL  Comprehensive metabolic panel  Result Value Ref Range   Glucose 87 65 - 99 mg/dL   BUN 24 8 - 27 mg/dL   Creatinine, Ser 9.14 (H) 0.57 - 1.00 mg/dL   GFR calc non Af Amer 47 (L) >59 mL/min/1.73   GFR calc Af Amer 54 (L) >59 mL/min/1.73   BUN/Creatinine Ratio 19 12 - 28   Sodium 141 134 - 144 mmol/L   Potassium 4.4 3.5 - 5.2 mmol/L   Chloride 104 96 - 106 mmol/L   CO2 20 20 - 29 mmol/L   Calcium 9.6 8.7 - 10.3 mg/dL   Total Protein 7.3 6.0 - 8.5 g/dL   Albumin 4.2 3.6 - 4.8 g/dL   Globulin, Total 3.1 1.5 - 4.5 g/dL   Albumin/Globulin Ratio 1.4 1.2 - 2.2   Bilirubin Total <0.2 0.0 - 1.2 mg/dL   Alkaline Phosphatase 69 39 - 117 IU/L   AST 27 0 - 40 IU/L   ALT 20 0 - 32 IU/L  TSH  Result Value Ref Range   TSH 0.608 0.450 - 4.500 uIU/mL  Bayer DCA Hb A1c Waived  Result Value Ref Range   Bayer DCA Hb A1c Waived 6.6 <7.0 %  Lipid Panel w/o Chol/HDL Ratio  Result Value Ref Range   Cholesterol, Total 189 100 - 199 mg/dL   Triglycerides 782 (H) 0 - 149 mg/dL   HDL 61 >95 mg/dL   VLDL Cholesterol Cal 39 5 - 40 mg/dL   LDL Calculated 89 0 - 99 mg/dL  VITAMIN D 25 Hydroxy (Vit-D Deficiency, Fractures)  Result Value Ref Range   Vit D, 25-Hydroxy 40.7 30.0 - 100.0 ng/mL  Hepatitis C antibody  Result Value Ref Range   Hep C Virus Ab <0.1 0.0 - 0.9 s/co ratio      Assessment & Plan:   Problem List Items Addressed This Visit      Unprioritized   CKD (chronic kidney disease), stage III (HCC)   Relevant Orders   Comprehensive metabolic panel   Diabetes (HCC) - Primary    Hgb A1C is 6.5%.  Continue with Metformin      Relevant Orders   Comprehensive metabolic panel   Bayer DCA Hb A2Z Waived   Hypertension    Stable, continue present medications.         Other Visit Diagnoses    Acute non-recurrent maxillary sinusitis        Relevant Medications   amoxicillin (AMOXIL) 875 MG tablet  Follow up plan: Return in about 6 months (around 04/01/2018).

## 2017-10-03 ENCOUNTER — Ambulatory Visit: Payer: Medicare Other | Admitting: Physical Therapy

## 2017-10-03 ENCOUNTER — Encounter: Payer: Self-pay | Admitting: Physical Therapy

## 2017-10-03 DIAGNOSIS — M25562 Pain in left knee: Secondary | ICD-10-CM

## 2017-10-03 DIAGNOSIS — R262 Difficulty in walking, not elsewhere classified: Secondary | ICD-10-CM

## 2017-10-03 DIAGNOSIS — M6281 Muscle weakness (generalized): Secondary | ICD-10-CM

## 2017-10-03 LAB — COMPREHENSIVE METABOLIC PANEL
ALT: 26 IU/L (ref 0–32)
AST: 25 IU/L (ref 0–40)
Albumin/Globulin Ratio: 1.4 (ref 1.2–2.2)
Albumin: 4.5 g/dL (ref 3.6–4.8)
Alkaline Phosphatase: 74 IU/L (ref 39–117)
BUN/Creatinine Ratio: 18 (ref 12–28)
BUN: 25 mg/dL (ref 8–27)
Bilirubin Total: <0.2 mg/dL (ref 0.0–1.2)
CO2: 20 mmol/L (ref 20–29)
Calcium: 10.2 mg/dL (ref 8.7–10.3)
Chloride: 103 mmol/L (ref 96–106)
Creatinine, Ser: 1.42 mg/dL — ABNORMAL HIGH (ref 0.57–1.00)
GFR calc Af Amer: 46 mL/min/{1.73_m2} — ABNORMAL LOW (ref >59)
GFR calc non Af Amer: 40 mL/min/{1.73_m2} — ABNORMAL LOW (ref >59)
Globulin, Total: 3.3 g/dL (ref 1.5–4.5)
Glucose: 83 mg/dL (ref 65–99)
Potassium: 5.6 mmol/L — ABNORMAL HIGH (ref 3.5–5.2)
Sodium: 139 mmol/L (ref 134–144)
Total Protein: 7.8 g/dL (ref 6.0–8.5)

## 2017-10-03 NOTE — Therapy (Signed)
[place] [facility] [address], [place], [address] Phone: [phone]   Fax:  [phone]  Physical Therapy Treatment  Patient Details  Name: Kim Thomas MRN: [id number] Date of Birth: [birth year] Referring Provider: Latanya Maudlin, PA   Encounter Date: 10/03/2017  PT End of Session - 10/03/17 1435    Visit Number  6    Number of Visits  12    Date for PT Re-Evaluation  10/25/17    Authorization Type  6    Authorization Time Period  10 (G code)    PT Start Time  1335    PT Stop Time  1430    PT Time Calculation (min)  55 min    Activity Tolerance  Patient tolerated treatment well    Behavior During Therapy  [facility] for tasks assessed/performed       Past Medical History:  Diagnosis Date  . Anxiety   . Chronic kidney disease   . DDD (degenerative disc disease), cervical   . Diabetes mellitus without complication (HCC)   . Gout   . Hypertension   . Sleep apnea     Past Surgical History:  Procedure Laterality Date  . BREAST BIOPSY Left    benign  . BREAST CYST EXCISION Left   . c section     x2  . CHOLECYSTECTOMY    . FOOT SURGERY Bilateral   . SPINE SURGERY  11/09/11   C5  . TUBAL LIGATION      There were no vitals filed for this visit.  Subjective Assessment - 10/03/17 1346    Subjective  Patient reports she is having questions regarding additional exercises.    Pertinent History  PMH: previous LBP, ACDF C6-7, HTN, diabetes, stage III kidney disease, 07/2016 arthroscopy left knee and noticed worsening symptoms about a year later    Limitations  Sitting;Standing;Walking;House hold activities    How long can you sit comfortably?  without crossing legs: 15 min.    How long can you stand comfortably?  10 min.    How long can you walk comfortably?  intermediate distances    Patient Stated Goals  be able to walk for exercise without difficulty    Currently in Pain?  Other (Comment) soreness,  popping in right knee may be due to weather change with cold, rainy weather.   soreness, popping in right knee may be due to weather change with cold, rainy weather.       Objective: Palpation: no point tenderness left knee over scars as compared to previous session Strength: improved quad control, able to perform full setting of quadriceps with improved control  Treatment: Modalities: Electrical stimulation: 15 min.Russian stim. 10/10 cycle applied (2) electrodes to quadriceps/VMO left LE (28ma with full contraction noted)with patient reclined with LE supported on pillow; pt. Performing quad sets/knee extension with each cycle; goal muscle re education High volt estim.clincial program for muscle spasms (2) electrodes applied to medial and lateral aspect of left kneeintensity to tolerance with patient in reclined position with LE supported on pillow goal: pain, spasms  Therapeutic exercise: patient performed with demonstration, VC, tactile cues of therapist: goal: independent with home program, improve LEFS, strength Leg press: bilateral LE's 45# and 55#  x 15; single leg 25# x 15 reps  Hip adduction with ball and glute sets Hip abduction with resistive band bilateral x 20,  Single each LE x 10 reps with assistance of therapist Knee flexion 1 x 15  reps resistive band Resistive band walk forward x 10 and backwards x 10 with assist of therapist for graded resistance and VC for correct technique  NuStep x 10min independent level #3(unbilled time); at beginning of session  Patient response to treatment: improved motor control and ability to perform exercises with increased intensity with minimal to moderate VC and guided exercises.Mild soreness left knee with exercises. Full contraction of quadriceps noted with electrical stimulation today.      PT Education - 10/03/17 1420    Education provided  Yes    Education Details  re assessed exercises and re instructed in ankle  DF/eversion and knee flexion with resistive band, added walking against resistive band    Person(s) Educated  Patient    Methods  Explanation;Demonstration;Verbal cues    Comprehension  Returned demonstration;Verbalized understanding;Verbal cues required          PT Long Term Goals - 09/14/17 2052      PT LONG TERM GOAL #1   Title  patient will demonstrate indpendence with home program for pain control and progressive exercise to allow transition to self managment once discharged from physical therapy    Baseline  requires cuing  and has limited knowledge of appropriate exercises and progression    Status  New    Target Date  10/25/17      PT LONG TERM GOAL #2   Title  improved LEFS to 55/80 or better indicating improvement in function with left LE for daily activities    Baseline  LEFS 42/80    Status  New    Target Date  10/25/17            Plan - 10/03/17 1440    Clinical Impression Statement  Patient is progressing steadily towards goals with progression noted in strength, endurance and function with left LE with less knee pain. She requires guidance, instruction and miinimal to moderate cuing to perform exercises correctly with proper intensity and technique.     Rehab Potential  Good    Clinical Impairments Affecting Rehab Potential  (-) lives alone, diabetes, chronic condition, arthritis (+) highly motivated, family support    PT Frequency  2x / week    PT Duration  6 weeks    PT Treatment/Interventions  Electrical Stimulation;Cryotherapy;Moist Heat;Gait training;Patient/family education;Neuromuscular re-education;Therapeutic exercise;Balance training;Manual techniques    PT Next Visit Plan  pain control, muscle re education, therapeutic exercise     PT Home Exercise Plan  stabilization in sitting for hip adduction with ball and glute sets, hip abduction with resistive band       Patient will benefit from skilled therapeutic intervention in order to improve the  following deficits and impairments:  Decreased strength, Pain, Impaired perceived functional ability, Decreased activity tolerance, Decreased endurance, Difficulty walking  Visit Diagnosis: Muscle weakness (generalized)  Left knee pain, unspecified chronicity  Difficulty in walking, not elsewhere classified     Problem List Patient Active Problem List   Diagnosis Date Noted  . Polypharmacy 06/30/2017  . Advanced care planning/counseling discussion 02/20/2017  . Risk for falls 02/20/2017  . Pain management 02/20/2017  . Insomnia 06/21/2016  . Elevated uric acid in blood 06/21/2016  . Recurrent boils 06/21/2016  . Pain in left knee 06/06/2016  . Allergic rhinitis 06/06/2016  . Abscess of vulva 06/06/2016  . Low back pain 06/06/2016  . Major depressive disorder, recurrent episode, moderate (HCC) 06/06/2016  . CKD (chronic kidney disease), stage III (HCC) 06/06/2016  . Diabetes (HCC) 05/25/2016  .  Hypertension 05/25/2016  . RLS (restless legs syndrome) 05/25/2016  . Arthritis of hand, right 05/25/2016  . Sleep apnea 05/25/2016  . Prominent metatarsal head 01/01/2016  . Hammertoe 01/01/2016  . Numbness and tingling 09/08/2015  . 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, [place], [address] Phone: [phone]   Fax:  [phone]  Name: Kim Thomas MRN: [id number] Date of Birth: 1956-07-25

## 2017-10-04 ENCOUNTER — Telehealth: Payer: Self-pay | Admitting: Unknown Physician Specialty

## 2017-10-04 MED ORDER — LISINOPRIL 10 MG PO TABS: 10.0000 mg | ORAL_TABLET | Freq: Every day | ORAL | 1 refills | Status: DC

## 2017-10-04 NOTE — Telephone Encounter (Signed)
Discussed with pt low kidney function.  Will DC Ziac.  Start Lisinopril 10 mg.  Recheck back in 1 month

## 2017-10-05 ENCOUNTER — Ambulatory Visit: Payer: Medicare Other | Admitting: Physical Therapy

## 2017-10-05 ENCOUNTER — Encounter: Payer: Self-pay | Admitting: Physical Therapy

## 2017-10-05 DIAGNOSIS — R262 Difficulty in walking, not elsewhere classified: Secondary | ICD-10-CM

## 2017-10-05 DIAGNOSIS — M25562 Pain in left knee: Secondary | ICD-10-CM

## 2017-10-05 DIAGNOSIS — M6281 Muscle weakness (generalized): Secondary | ICD-10-CM | POA: Diagnosis not present

## 2017-10-05 NOTE — Therapy (Signed)
[place] [facility] PH[facility] AND [facility] MEDICINE [address] [place], [place], [address] Phone: [phone]   Fax:  [phone]  Physical Therapy Treatment  Patient Details  Name: Kim Thomas MRN: [id number] Date of Birth: 31-Jul-1956 Referring Provider: Latanya MaudlinWolfe, Jon Richard, PA   Encounter Date: 10/05/2017  PT End of Session - 10/05/17 1426    Visit Number  7    Number of Visits  12    Date for PT Re-Evaluation  10/25/17    Authorization Type  7    Authorization Time Period  10 (G code)    PT Start Time  1422    PT Stop Time  1512    PT Time Calculation (min)  50 min    Activity Tolerance  Patient tolerated treatment well    Behavior During Therapy  [facility]/performed       Past Medical History:  Diagnosis Date  . Anxiety   . Chronic kidney disease   . DDD (degenerative disc disease), cervical   . Diabetes mellitus without complication (HCC)   . Gout   . Hypertension   . Sleep apnea     Past Surgical History:  Procedure Laterality Date  . BREAST BIOPSY Left    benign  . BREAST CYST EXCISION Left   . c section     x2  . CHOLECYSTECTOMY    . FOOT SURGERY Bilateral   . SPINE SURGERY  11/09/11   C5  . TUBAL LIGATION      There were no vitals filed for this visit.  Subjective Assessment - 10/05/17 1424    Subjective  Patient reports her knee is doing well.     Pertinent History  PMH: previous LBP, ACDF C6-7, HTN, diabetes, stage III kidney disease, 07/2016 arthroscopy left knee and noticed worsening symptoms about a year later    Limitations  Sitting;Standing;Walking;House hold activities    How long can you sit comfortably?  without crossing legs: 15 min.    How long can you stand comfortably?  10 min.    How long can you walk comfortably?  intermediate distances    Patient Stated Goals  be able to walk for exercise without difficulty    Currently in Pain?  Other (Comment) mild soreness    Pain Orientation   Medial;Left    Pain Descriptors / Indicators  Discomfort    Pain Type  Chronic pain    Pain Onset  More than a month ago    Pain Frequency  Constant       Objective: Gait: WNL independent without AD  Treatment: Modalities: Electrical stimulation: 15 min.Russian stim. 10/10 cycle applied (2) electrodes to quadriceps/VMO left LE (28ma with full contraction noted)with patient reclined with LE supported on pillow; pt. Performing quad sets/knee extension with each cycle; goal muscle re education High volt estim.clincial program for muscle spasms (2) electrodes applied to medial and lateral aspect of left kneeintensity to tolerance with patient in reclined position with LE supported on pillow goal: pain, spasms  Therapeutic exercise: patient performed with demonstration, VC, tactile cues of therapist: goal: independent with home program, improve LEFS, strength Leg press: bilateral LE's 45# and 55# x 15; single leg 25# x 15 reps  Hip adduction with ball and glute sets Hip abduction with resistive bandbilateral x 20, Single each LE x 10 reps with assistance of therapist Knee flexion 1 x 15 reps with green resistive band Sit to stand with balance pad x 5 reps  with demonstration and repeated cuing for correct technique Sit to stand without balance pad x 10 reps, alternating which foot was positioned in rear; required cuing to place weight through heels, not toes when standing  NuStep x5510min independent level #3(unbilled time); atbeginningof session  Patient response to treatment:Full contraction of quadriceps with electrical stimulation, no increased soreness in knee with exercises. Improved motor control with exercises with repetition and moderate VC.      PT Education - 10/05/17 1430    Education provided  Yes    Education Details  exercise instruction    Person(s) Educated  Patient    Methods  Explanation;Demonstration;Verbal cues    Comprehension  Returned  demonstration;Verbal cues required;Verbalized understanding          PT Long Term Goals - 09/14/17 2052      PT LONG TERM GOAL #1   Title  patient will demonstrate indpendence with home program for pain control and progressive exercise to allow transition to self managment once discharged from physical therapy    Baseline  requires cuing  and has limited knowledge of appropriate exercises and progression    Status  New    Target Date  10/25/17      PT LONG TERM GOAL #2   Title  improved LEFS to 55/80 or better indicating improvement in function with left LE for daily activities    Baseline  LEFS 42/80    Status  New    Target Date  10/25/17            Plan - 10/05/17 1426    Clinical Impression Statement  Patient is progressing steadily with goals and exercises. She required minimal to moderate cuing depending on the exercise with unfamiliar exercises requiring additional cuing and demonstration.    Rehab Potential  Good    Clinical Impairments Affecting Rehab Potential  (-) lives alone, diabetes, chronic condition, arthritis (+) highly motivated, family support    PT Frequency  2x / week    PT Duration  6 weeks    PT Treatment/Interventions  Electrical Stimulation;Cryotherapy;Moist Heat;Gait training;Patient/family education;Neuromuscular re-education;Therapeutic exercise;Balance training;Manual techniques    PT Next Visit Plan  pain control, muscle re education, therapeutic exercise     PT Home Exercise Plan  stabilization in sitting for hip adduction with ball and glute sets, hip abduction with resistive band       Patient will benefit from skilled therapeutic intervention in order to improve the following deficits and impairments:  Decreased strength, Pain, Impaired perceived functional ability, Decreased activity tolerance, Decreased endurance, Difficulty walking  Visit Diagnosis: Muscle weakness (generalized)  Left knee pain, unspecified chronicity  Difficulty in  walking, not elsewhere classified     Problem List Patient Active Problem List   Diagnosis Date Noted  . Polypharmacy 06/30/2017  . Advanced care planning/counseling discussion 02/20/2017  . Risk for falls 02/20/2017  . Pain management 02/20/2017  . Insomnia 06/21/2016  . Elevated uric acid in blood 06/21/2016  . Recurrent boils 06/21/2016  . Pain in left knee 06/06/2016  . Allergic rhinitis 06/06/2016  . Abscess of vulva 06/06/2016  . Low back pain 06/06/2016  . Major depressive disorder, recurrent episode, moderate (HCC) 06/06/2016  . CKD (chronic kidney disease), stage III (HCC) 06/06/2016  . Diabetes (HCC) 05/25/2016  . Hypertension 05/25/2016  . RLS (restless legs syndrome) 05/25/2016  . Arthritis of hand, right 05/25/2016  . Sleep apnea 05/25/2016  . Prominent metatarsal head 01/01/2016  . Hammertoe 01/01/2016  . Numbness  and tingling 09/08/2015  . 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    Beacher May PT 10/06/2017, 11:23 AM  [place] [facility] PHYSICAL AND SPORTS MEDICINE [address] [place], [address] Phone: [phone]   Fax:  [phone]  Name: Kim Thomas MRN: [id number] Date of Birth: [birth year]

## 2017-10-09 ENCOUNTER — Other Ambulatory Visit: Payer: Self-pay

## 2017-10-09 ENCOUNTER — Ambulatory Visit: Payer: Medicare Other | Admitting: Physical Therapy

## 2017-10-09 ENCOUNTER — Encounter: Payer: Self-pay | Admitting: Physical Therapy

## 2017-10-09 DIAGNOSIS — M25562 Pain in left knee: Secondary | ICD-10-CM

## 2017-10-09 DIAGNOSIS — R262 Difficulty in walking, not elsewhere classified: Secondary | ICD-10-CM

## 2017-10-09 DIAGNOSIS — M6281 Muscle weakness (generalized): Secondary | ICD-10-CM | POA: Diagnosis not present

## 2017-10-10 NOTE — Therapy (Signed)
[place] [facility] [address] [place], [address] Phone: [phone]   Fax:  [phone]  Physical Therapy Treatment  Patient Details  Name: Kim Thomas MRN: [id number] Date of Birth: Sep 29, 1956 Referring Provider: Latanya Maudlin, PA   Encounter Date: 10/09/2017  PT End of Session - 10/09/17 1528    Visit Number  8    Number of Visits  12    Date for PT Re-Evaluation  10/25/17    Authorization Type  8    Authorization Time Period  10 (G code)    PT Start Time  1425    PT Stop Time  1515    PT Time Calculation (min)  50 min    Activity Tolerance  Patient tolerated treatment well    Behavior During Therapy  [facility] for tasks assessed/performed       Past Medical History:  Diagnosis Date  . Anxiety   . Chronic kidney disease   . DDD (degenerative disc disease), cervical   . Diabetes mellitus without complication (HCC)   . Gout   . Hypertension   . Sleep apnea     Past Surgical History:  Procedure Laterality Date  . BREAST BIOPSY Left    benign  . BREAST CYST EXCISION Left   . c section     x2  . CHOLECYSTECTOMY    . FOOT SURGERY Bilateral   . SPINE SURGERY  11/09/11   C5  . TUBAL LIGATION      There were no vitals filed for this visit.  Subjective Assessment - 10/09/17 1445    Subjective  Patient reports her left knee continues to improve strength and is able to do more with less difficulty. She reports therapy is helping a great deal.     Pertinent History  PMH: previous LBP, ACDF C6-7, HTN, diabetes, stage III kidney disease, 07/2016 arthroscopy left knee and noticed worsening symptoms about a year later    Limitations  Sitting;Standing;Walking;House hold activities    How long can you sit comfortably?  without crossing legs: 15 min.    How long can you stand comfortably?  10 min.    How long can you walk comfortably?  intermediate distances    Patient Stated Goals  be able to walk for  exercise without difficulty    Currently in Pain?  No/denies       Objective: Gait: WNL independent without AD Strength: left LE knee extension 4/5, flexion 4-/5; hip flexion 4/5, abduction 4-/5, extension 4-/5  Treatment: Modalities: Electrical stimulation:89min.Russian stim. 10/10 cycle applied (2) electrodes to quadriceps/VMO left LE (28ma with full contraction noted)with patient reclined with LE supported on pillow; pt. Performing quad sets/knee extension with each cycle; goal muscle re education High volt estim.clincial program for muscle spasms (2) electrodes applied to medial and lateral aspect of left kneeintensity to tolerance with patient in reclined position with LE supported on pillow goal: pain, spasms  Therapeutic exercise: patient performed with demonstration, VC, tactile cues of therapist: goal: independent with home program, improve LEFS, strength Leg press: bilateral LE's 45#and 55#x 15;single leg25#/35# x15reps  Hip adduction with ball and glute sets Hip abduction with resistive bandbilateral x 20, Single each LE x 10 reps with assistance of therapist Knee flexion2x 15 reps with green resistive band Hip flexion isometric with ball x 10 reps each LE with 3 second holds Walk against resistive band forward, backwards x 10 reps with VC and UE support for balance  as needed  NuStep x5910min independent level #4(unbilled time); atbeginningof session  Patient response to treatment:Improved contraction of quadriceps with estim. Patient required minimal VC and guidance to perform exercises for positioning, technique.      PT Education - 10/09/17 1500    Education provided  Yes    Education Details  exercise instruction    Person(s) Educated  Patient    Methods  Explanation;Demonstration;Verbal cues    Comprehension  Verbalized understanding;Returned demonstration;Verbal cues required          PT Long Term Goals - 09/14/17 2052      PT LONG  TERM GOAL #1   Title  patient will demonstrate indpendence with home program for pain control and progressive exercise to allow transition to self managment once discharged from physical therapy    Baseline  requires cuing  and has limited knowledge of appropriate exercises and progression    Status  New    Target Date  10/25/17      PT LONG TERM GOAL #2   Title  improved LEFS to 55/80 or better indicating improvement in function with left LE for daily activities    Baseline  LEFS 42/80    Status  New    Target Date  10/25/17            Plan - 10/09/17 1529    Clinical Impression Statement  Patient demonstrates steady improvement with strength, endurance and function with daily tasks with less pain in her left knee. She continues to require guidance, instruction and VC for performing exercises with good technique and alignment.     Rehab Potential  Good    Clinical Impairments Affecting Rehab Potential  (-) lives alone, diabetes, chronic condition, arthritis (+) highly motivated, family support    PT Frequency  2x / week    PT Duration  6 weeks    PT Treatment/Interventions  Electrical Stimulation;Cryotherapy;Moist Heat;Gait training;Patient/family education;Neuromuscular re-education;Therapeutic exercise;Balance training;Manual techniques    PT Next Visit Plan  pain control, muscle re education, therapeutic exercise     PT Home Exercise Plan  stabilization in sitting for hip adduction with ball and glute sets, hip abduction with resistive band       Patient will benefit from skilled therapeutic intervention in order to improve the following deficits and impairments:  Decreased strength, Pain, Impaired perceived functional ability, Decreased activity tolerance, Decreased endurance, Difficulty walking  Visit Diagnosis: Muscle weakness (generalized)  Left knee pain, unspecified chronicity  Difficulty in walking, not elsewhere classified     Problem List Patient Active  Problem List   Diagnosis Date Noted  . Polypharmacy 06/30/2017  . Advanced care planning/counseling discussion 02/20/2017  . Risk for falls 02/20/2017  . Pain management 02/20/2017  . Insomnia 06/21/2016  . Elevated uric acid in blood 06/21/2016  . Recurrent boils 06/21/2016  . Pain in left knee 06/06/2016  . Allergic rhinitis 06/06/2016  . Abscess of vulva 06/06/2016  . Low back pain 06/06/2016  . Major depressive disorder, recurrent episode, moderate (HCC) 06/06/2016  . CKD (chronic kidney disease), stage III (HCC) 06/06/2016  . Diabetes (HCC) 05/25/2016  . Hypertension 05/25/2016  . RLS (restless legs syndrome) 05/25/2016  . Arthritis of hand, right 05/25/2016  . Sleep apnea 05/25/2016  . Prominent metatarsal head 01/01/2016  . Hammertoe 01/01/2016  . Numbness and tingling 09/08/2015  . 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    Beacher MayBrooks, Uriel Dowding PT 10/10/2017, 3:32 PM  [place] [facility] [address] [place], [place], [address] Phone: [phone]   Fax:  [phone]  Name: Kim Thomas MRN: [id number] Date of Birth: [birth year]

## 2017-10-11 ENCOUNTER — Encounter: Payer: Self-pay | Admitting: Physical Therapy

## 2017-10-11 ENCOUNTER — Other Ambulatory Visit: Payer: Self-pay

## 2017-10-11 ENCOUNTER — Ambulatory Visit: Payer: Medicare Other | Admitting: Physical Therapy

## 2017-10-11 DIAGNOSIS — M6281 Muscle weakness (generalized): Secondary | ICD-10-CM

## 2017-10-11 DIAGNOSIS — M25562 Pain in left knee: Secondary | ICD-10-CM

## 2017-10-11 DIAGNOSIS — R262 Difficulty in walking, not elsewhere classified: Secondary | ICD-10-CM

## 2017-10-12 NOTE — Therapy (Signed)
[place] [facility] PH[facility]IC[facility] [address] , [place], [address] Phone: [phone]   Fax:  [phone]  Physical Therapy Treatment  Patient Details  Name: Kim Thomas MRN: [id number] Date of Birth: Feb 15, 1956 Referring Provider: Latanya MaudlinWolfe, Jon Richard, PA   Encounter Date: 10/11/2017  PT End of Session - 10/11/17 1439    Visit Number  9    Number of Visits  12    Date for PT Re-Evaluation  10/25/17    Authorization Type  9    Authorization Time Period  10 (G code)    PT Start Time  1415    PT Stop Time  1504    PT Time Calculation (min)  49 min    Activity Tolerance  Patient tolerated treatment well    Behavior During Therapy  [facility] for tasks assessed/performed       Past Medical History:  Diagnosis Date  . Anxiety   . Chronic kidney disease   . DDD (degenerative disc disease), cervical   . Diabetes mellitus without complication (HCC)   . Gout   . Hypertension   . Sleep apnea     Past Surgical History:  Procedure Laterality Date  . BREAST BIOPSY Left    benign  . BREAST CYST EXCISION Left   . c section     x2  . CHOLECYSTECTOMY    . FOOT SURGERY Bilateral   . KNEE ARTHROSCOPY Left 07/18/2016   Procedure: ARTHROSCOPY KNEE;  Surgeon: Donato HeinzJames P Hooten, MD;  Location: [facility] ORS;  Service: Orthopedics;  Laterality: Left;  . KNEE ARTHROSCOPY WITH LATERAL MENISECTOMY Left 07/18/2016   Procedure: KNEE ARTHROSCOPY WITH PARTIAL  LATERAL MENISECTOMY, CHONDROPLASTY LATERAL FEMORAL CONDYLE;  Surgeon: Donato HeinzJames P Hooten, MD;  Location: [facility] ORS;  Service: Orthopedics;  Laterality: Left;  . SPINE SURGERY  11/09/11   C5  . TUBAL LIGATION      There were no vitals filed for this visit.  Subjective Assessment - 10/11/17 1422    Subjective  Patient continues to see improvement with strength and function with left LE with mild to no pain intermittently in knee. Her right knee is bothering her today, medial aspect.    Pertinent History   PMH: previous LBP, ACDF C6-7, HTN, diabetes, stage III kidney disease, 07/2016 arthroscopy left knee and noticed worsening symptoms about a year later    Limitations  Sitting;Standing;Walking;House hold activities    How long can you sit comfortably?  without crossing legs: 15 min.    How long can you stand comfortably?  10 min.    How long can you walk comfortably?  intermediate distances    Patient Stated Goals  be able to walk for exercise without difficulty    Currently in Pain?  No/denies         Objective: Gait: WNL independent without AD  Treatment: Modalities: Electrical stimulation:6015min.Russian stim. 10/10 cycle applied (2) electrodes to quadriceps/VMO left LE (28ma with full contraction noted)with patient reclined with LE supported on pillow; pt. Performing quad sets/knee extension with each cycle; goal muscle re education High volt estim.clincial program for muscle spasms (2) electrodes applied to medial and lateral aspect of left kneeintensity to tolerance with patient in reclined position with LE supported on pillow goal: pain, spasms  Therapeutic exercise: patient performed with demonstration, VC, tactile cues of therapist: goal: independent with home program, improve LEFS, strength Leg press: bilateral LE's 45#and 65#x 15 -25 reps;single leg25#/35# x15 -25 reps  Hip adduction with  ball and glute sets Hip abduction with resistive bandbilateral x 20, Single each LE x 10 reps with assistance of therapist Knee flexion2x 15 repswith greenresistive band Hip flexion isometric with ball x 10 reps each LE with 3 second holds Hip rotary machine: each LE:  Hip extension 85# 2 x 15  Hip abduction 55#/40# x 15 reps each   NuStep x3910min independent level #4(unbilled time); atbeginningof session  Patient response to treatment:Improved technique and motor control with correct muscle activation with leg press and hip exercises with tactile and VC. Improved  quadriceps contraction with estim.      PT Education - 10/11/17 1433    Education provided  Yes    Education Details  exercise instruction    Person(s) Educated  Patient    Methods  Explanation;Demonstration;Verbal cues    Comprehension  Verbalized understanding;Returned demonstration;Verbal cues required          PT Long Term Goals - 09/14/17 2052      PT LONG TERM GOAL #1   Title  patient will demonstrate indpendence with home program for pain control and progressive exercise to allow transition to self managment once discharged from physical therapy    Baseline  requires cuing  and has limited knowledge of appropriate exercises and progression    Status  New    Target Date  10/25/17      PT LONG TERM GOAL #2   Title  improved LEFS to 55/80 or better indicating improvement in function with left LE for daily activities    Baseline  LEFS 42/80    Status  New    Target Date  10/25/17            Plan - 10/11/17 1444    Clinical Impression Statement  Patient is progressing well with increased intensity of exercises demonstrating improving strength. She is progressing steadily towards goals with improvement noted in function with mild to no knee pain. She continues to require guidance for appropriate intensity and progression with exercises to allow proper muscle activation.   Rehab Potential  Good    Clinical Impairments Affecting Rehab Potential  (-) lives alone, diabetes, chronic condition, arthritis (+) highly motivated, family support    PT Frequency  2x / week    PT Duration  6 weeks    PT Treatment/Interventions  Electrical Stimulation;Cryotherapy;Moist Heat;Gait training;Patient/family education;Neuromuscular re-education;Therapeutic exercise;Balance training;Manual techniques    PT Next Visit Plan  pain control, muscle re education, therapeutic exercise     PT Home Exercise Plan  stabilization in sitting for hip adduction with ball and glute sets, hip abduction with  resistive band       Patient will benefit from skilled therapeutic intervention in order to improve the following deficits and impairments:  Decreased strength, Pain, Impaired perceived functional ability, Decreased activity tolerance, Decreased endurance, Difficulty walking  Visit Diagnosis: Muscle weakness (generalized)  Left knee pain, unspecified chronicity  Difficulty in walking, not elsewhere classified     Problem List Patient Active Problem List   Diagnosis Date Noted  . Polypharmacy 06/30/2017  . Advanced care planning/counseling discussion 02/20/2017  . Risk for falls 02/20/2017  . Pain management 02/20/2017  . Insomnia 06/21/2016  . Elevated uric acid in blood 06/21/2016  . Recurrent boils 06/21/2016  . Pain in left knee 06/06/2016  . Allergic rhinitis 06/06/2016  . Abscess of vulva 06/06/2016  . Low back pain 06/06/2016  . Major depressive disorder, recurrent episode, moderate (HCC) 06/06/2016  . CKD (chronic kidney  disease), stage III (HCC) 06/06/2016  . Diabetes (HCC) 05/25/2016  . Hypertension 05/25/2016  . RLS (restless legs syndrome) 05/25/2016  . Arthritis of hand, right 05/25/2016  . Sleep apnea 05/25/2016  . Prominent metatarsal head 01/01/2016  . Hammertoe 01/01/2016  . Numbness and tingling 09/08/2015  . 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 [place], [address] Phone: [phone]   Fax:  [phone]  Name: Charidy Cappelletti MRN: [id number] Date of Birth: January 19, 1956

## 2017-10-16 ENCOUNTER — Ambulatory Visit: Payer: Medicare Other | Admitting: Physical Therapy

## 2017-10-16 ENCOUNTER — Encounter: Payer: Self-pay | Admitting: Physical Therapy

## 2017-10-16 ENCOUNTER — Other Ambulatory Visit: Payer: Self-pay

## 2017-10-16 DIAGNOSIS — M6281 Muscle weakness (generalized): Secondary | ICD-10-CM | POA: Diagnosis not present

## 2017-10-16 DIAGNOSIS — R262 Difficulty in walking, not elsewhere classified: Secondary | ICD-10-CM

## 2017-10-16 DIAGNOSIS — M25562 Pain in left knee: Secondary | ICD-10-CM

## 2017-10-16 NOTE — Therapy (Signed)
[place] [facility] PHYSICAL AND SPORTS MEDICINE [address] [place], [place], [address] Phone: [phone]   Fax:  [phone]  Physical Therapy Treatment/Progress Report  Patient Details  Name: Kim Thomas MRN: [id number] Date of Birth: 1956-10-02 Referring Provider: Latanya MaudlinWolfe, Jon Richard, PA   Encounter Date: 10/16/2017  PT End of Session - 10/16/17 1431    Visit Number  10    Number of Visits  12    Date for PT Re-Evaluation  10/25/17    Authorization Type  10    Authorization Time Period  10 (G code)    PT Start Time  1417    PT Stop Time  1515    PT Time Calculation (min)  58 min    Activity Tolerance  Patient tolerated treatment well    Behavior During Therapy  [facility] for tasks assessed/performed       Past Medical History:  Diagnosis Date  . Anxiety   . Chronic kidney disease   . DDD (degenerative disc disease), cervical   . Diabetes mellitus without complication (HCC)   . Gout   . Hypertension   . Sleep apnea     Past Surgical History:  Procedure Laterality Date  . BREAST BIOPSY Left    benign  . BREAST CYST EXCISION Left   . c section     x2  . CHOLECYSTECTOMY    . FOOT SURGERY Bilateral   . KNEE ARTHROSCOPY Left 07/18/2016   Procedure: ARTHROSCOPY KNEE;  Surgeon: Donato HeinzJames P Hooten, MD;  Location: [facility];  Service: Orthopedics;  Laterality: Left;  . KNEE ARTHROSCOPY WITH LATERAL MENISECTOMY Left 07/18/2016   Procedure: KNEE ARTHROSCOPY WITH PARTIAL  LATERAL MENISECTOMY, CHONDROPLASTY LATERAL FEMORAL CONDYLE;  Surgeon: Donato HeinzJames P Hooten, MD;  Location: [facility];  Service: Orthopedics;  Laterality: Left;  . SPINE SURGERY  11/09/11   C5  . TUBAL LIGATION      There were no vitals filed for this visit.  Subjective Assessment - 10/16/17 1428    Subjective  Patient reports significant decrease in pain in left knee nad is having spasming in left ankle with rest.     Pertinent History  PMH: previous LBP, ACDF C6-7, HTN, diabetes, stage  III kidney disease, 07/2016 arthroscopy left knee and noticed worsening symptoms about a year later    Limitations  Sitting;Standing;Walking;House hold activities    How long can you sit comfortably?  without crossing legs: 15 min.    How long can you stand comfortably?  10 min.    How long can you walk comfortably?  intermediate distances    Patient Stated Goals  be able to walk for exercise without difficulty    Currently in Pain?  No/denies         Objective: Gait: independent, no deviations noted Strength: decreased left hip abduction, extension as compared to right LE    Treatment: Modalities: Electrical stimulation:3115min.Russian stim. 10/10 cycle applied (2) electrodes to quadriceps/VMO left LE (28ma with full contraction noted)with patient reclined with LE supported on pillow; pt. Performing quad sets/knee extension with each cycle; goal muscle re education High volt estim.clincial program for muscle spasms (2) electrodes applied to medial and lateral aspect of left kneeintensity to tolerance with patient in reclined position with LE supported on pillow goal: pain, spasms  Therapeutic exercise: patient performed with demonstration, VC, tactile cues of therapist: goal: independent with home program, improve LEFS, strength Leg press: bilateral LE's 45#and 65#x 25 reps;single leg45#2 x15 reps  Balance  airex beam x 3 min. Side stepping  Knee flexion2x 15 repswith greenresistive band Hip rotary machine: each LE:  Hip extension 85# 2 x 15  Hip abduction 55## 2x 15 reps each   NuStep x2612min independent level #5(unbilled time); atbeginningof session  Patient response to treatment: improved technique and motor control with exercises with minimal demonstration and verbal cuing. Improved quadriceps contraction with estim.       PT Education - 10/16/17 1430    Education provided  Yes    Education Details  exercise instruction     Person(s) Educated   Patient    Methods  Explanation;Demonstration;Verbal cues    Comprehension  Verbalized understanding;Verbal cues required;Returned demonstration          PT Long Term Goals - 10/16/17 1517      PT LONG TERM GOAL #1   Title  patient will demonstrate indpendence with home program for pain control and progressive exercise to allow transition to self managment once discharged from physical therapy    Baseline  requires cuing  and has limited knowledge of appropriate exercises and progression    Status  On-going    Target Date  10/25/17      PT LONG TERM GOAL #2   Title  improved LEFS to 55/80 or better indicating improvement in function with left LE for daily activities    Baseline  LEFS 42/80    Status  On-going    Target Date  10/25/17            Plan - 10/16/17 1503    Clinical Impression Statement  Patient is progressing well with less pain and improving function with less pain and difficulty, shopping more time with mild fatigue at end. improved with resting.  She continues to progress steadily with all goals and should continue to improve with additional physical therapy intervention.   Rehab Potential  Good    Clinical Impairments Affecting Rehab Potential  (-) lives alone, diabetes, chronic condition, arthritis (+) highly motivated, family support    PT Frequency  2x / week    PT Duration  6 weeks    PT Treatment/Interventions  Electrical Stimulation;Cryotherapy;Moist Heat;Gait training;Patient/family education;Neuromuscular re-education;Therapeutic exercise;Balance training;Manual techniques    PT Next Visit Plan  pain control, muscle re education, therapeutic exercise     PT Home Exercise Plan  stabilization in sitting for hip adduction with ball and glute sets, hip abduction with resistive band       Patient will benefit from skilled therapeutic intervention in order to improve the following deficits and impairments:  Decreased strength, Pain, Impaired perceived  functional ability, Decreased activity tolerance, Decreased endurance, Difficulty walking  Visit Diagnosis: Muscle weakness (generalized)  Left knee pain, unspecified chronicity  Difficulty in walking, not elsewhere classified   G-Codes - 10/16/17 1510    Functional Assessment Tool Used (Outpatient Only)  LEFS, 5x sit to stand, strength, pain, clinical judgment    Functional Limitation  Mobility: Walking and moving around    Mobility: Walking and Moving Around Current Status [phone]([id number])  At least 20 percent but less than 40 percent impaired, limited or restricted    Mobility: Walking and Moving Around Goal Status [phone](G8979)  At least 1 percent but less than 20 percent impaired, limited or restricted       Problem List Patient Active Problem List   Diagnosis Date Noted  . Polypharmacy 06/30/2017  . Advanced care planning/counseling discussion 02/20/2017  . Risk for falls 02/20/2017  . Pain management  02/20/2017  . Insomnia 06/21/2016  . Elevated uric acid in blood 06/21/2016  . Recurrent boils 06/21/2016  . Pain in left knee 06/06/2016  . Allergic rhinitis 06/06/2016  . Abscess of vulva 06/06/2016  . Low back pain 06/06/2016  . Major depressive disorder, recurrent episode, moderate (HCC) 06/06/2016  . CKD (chronic kidney disease), stage III (HCC) 06/06/2016  . Diabetes (HCC) 05/25/2016  . Hypertension 05/25/2016  . RLS (restless legs syndrome) 05/25/2016  . Arthritis of hand, right 05/25/2016  . Sleep apnea 05/25/2016  . Prominent metatarsal head 01/01/2016  . Hammertoe 01/01/2016  . Numbness and tingling 09/08/2015  . 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    Beacher May PT 10/17/2017, 9:27 PM  [facility] PHYSICAL AND SPORTS MEDICINE [address] [place], [address] Phone: [phone]   Fax:  [phone]  Name: Kim Thomas MRN: [id number] Date of Birth: June 20, 1956

## 2017-10-18 ENCOUNTER — Encounter: Payer: Self-pay | Admitting: Physical Therapy

## 2017-10-18 ENCOUNTER — Ambulatory Visit: Payer: Medicare Other | Admitting: Physical Therapy

## 2017-10-18 DIAGNOSIS — M6281 Muscle weakness (generalized): Secondary | ICD-10-CM | POA: Diagnosis not present

## 2017-10-18 DIAGNOSIS — R262 Difficulty in walking, not elsewhere classified: Secondary | ICD-10-CM

## 2017-10-18 DIAGNOSIS — M25562 Pain in left knee: Secondary | ICD-10-CM

## 2017-10-18 NOTE — Therapy (Signed)
[place] [facility] [address] [place], [place], [address] Phone: [phone]   Fax:  [phone]  Physical Therapy Treatment  Patient Details  Name: Kim Thomas MRN: [id number] Date of Birth: November 26, 1956 Referring Provider: Latanya MaudlinWolfe, Jon Richard, PA   Encounter Date: 10/18/2017  PT End of Session - 10/18/17 1423    Visit Number  11    Number of Visits  12    Date for PT Re-Evaluation  10/25/17    Authorization Type  11    Authorization Time Period  20 (G code)    PT Start Time  1417    PT Stop Time  1510    PT Time Calculation (min)  53 min    Activity Tolerance  Patient tolerated treatment well    Behavior During Therapy  [facility] for tasks assessed/performed       Past Medical History:  Diagnosis Date  . Anxiety   . Chronic kidney disease   . DDD (degenerative disc disease), cervical   . Diabetes mellitus without complication (HCC)   . Gout   . Hypertension   . Sleep apnea     Past Surgical History:  Procedure Laterality Date  . BREAST BIOPSY Left    benign  . BREAST CYST EXCISION Left   . c section     x2  . CHOLECYSTECTOMY    . FOOT SURGERY Bilateral   . KNEE ARTHROSCOPY Left 07/18/2016   Procedure: ARTHROSCOPY KNEE;  Surgeon: Donato HeinzJames P Hooten, MD;  Location: [facility];  Service: Orthopedics;  Laterality: Left;  . KNEE ARTHROSCOPY WITH LATERAL MENISECTOMY Left 07/18/2016   Procedure: KNEE ARTHROSCOPY WITH PARTIAL  LATERAL MENISECTOMY, CHONDROPLASTY LATERAL FEMORAL CONDYLE;  Surgeon: Donato HeinzJames P Hooten, MD;  Location: [facility];  Service: Orthopedics;  Laterality: Left;  . SPINE SURGERY  11/09/11   C5  . TUBAL LIGATION      There were no vitals filed for this visit.  Subjective Assessment - 10/18/17 1421    Subjective  Patient without concerns today    Pertinent History  PMH: previous LBP, ACDF C6-7, HTN, diabetes, stage III kidney disease, 07/2016 arthroscopy left knee and noticed worsening symptoms about a year  later    Limitations  Sitting;Standing;Walking;House hold activities    How long can you sit comfortably?  without crossing legs: 15 min.    How long can you stand comfortably?  10 min.    How long can you walk comfortably?  intermediate distances    Patient Stated Goals  be able to walk for exercise without difficulty    Currently in Pain?  No/denies         Objective: Gait: independent, no deviations noted Strength: decreased left hip abduction, extension as compared to right LE  Treatment: Modalities: Electrical stimulation:1515min.Russian stim. 10/10 cycle applied (2) electrodes to quadriceps/VMO left LE (28ma with full contraction noted)with patient reclined with LE supported on pillow; pt. Performing quad sets/knee extension with each cycle; goal muscle re education High volt estim.clincial program for muscle spasms (2) electrodes applied to medial and lateral aspect of left kneeintensity to tolerance with patient in reclined position with LE supported on pillow goal: pain, spasms  Therapeutic exercise: patient performed with demonstration, VC, tactile cues of therapist: goal: independent with home program, improve LEFS, strength Leg press: bilateral LE's 45#and65#x 25 reps;single leg45#2 x15 reps Balance airex beam x 3 min. Side stepping  Hip rotary machine: each LE:  Hip extension 85# 2 x 15  Hip abduction 55#2x 15 reps each  NuStep x5612min independent level #5(unbilled time); atbeginningof session  Patient response to treatment: improved technique and motor control with exercises with minimal demonstration and verbal cuing. Improved quadriceps contraction with estim.     PT Education - 10/18/17 1500    Education provided  Yes    Education Details  exercise instruction with proper intensity and monitoring for pain    Person(s) Educated  Patient    Methods  Explanation;Demonstration;Verbal cues    Comprehension  Verbalized understanding;Returned  demonstration;Verbal cues required          PT Long Term Goals - 10/16/17 1517      PT LONG TERM GOAL #1   Title  patient will demonstrate indpendence with home program for pain control and progressive exercise to allow transition to self managment once discharged from physical therapy    Baseline  requires cuing  and has limited knowledge of appropriate exercises and progression    Status  On-going    Target Date  10/25/17      PT LONG TERM GOAL #2   Title  improved LEFS to 55/80 or better indicating improvement in function with left LE for daily activities    Baseline  LEFS 42/80    Status  On-going    Target Date  10/25/17            Plan - 10/18/17 1424    Clinical Impression Statement  Patient continues to progress well with less pain and improving function with daily activities. She requires minimal cuing to perform exercises with appropriate intensity and technique.   Rehab Potential  Good    Clinical Impairments Affecting Rehab Potential  (-) lives alone, diabetes, chronic condition, arthritis (+) highly motivated, family support    PT Frequency  2x / week    PT Duration  6 weeks    PT Treatment/Interventions  Electrical Stimulation;Cryotherapy;Moist Heat;Gait training;Patient/family education;Neuromuscular re-education;Therapeutic exercise;Balance training;Manual techniques    PT Next Visit Plan  pain control, muscle re education, therapeutic exercise     PT Home Exercise Plan  stabilization in sitting for hip adduction with ball and glute sets, hip abduction with resistive band       Patient will benefit from skilled therapeutic intervention in order to improve the following deficits and impairments:  Decreased strength, Pain, Impaired perceived functional ability, Decreased activity tolerance, Decreased endurance, Difficulty walking  Visit Diagnosis: Muscle weakness (generalized)  Left knee pain, unspecified chronicity  Difficulty in walking, not elsewhere  classified     Problem List Patient Active Problem List   Diagnosis Date Noted  . Polypharmacy 06/30/2017  . Advanced care planning/counseling discussion 02/20/2017  . Risk for falls 02/20/2017  . Pain management 02/20/2017  . Insomnia 06/21/2016  . Elevated uric acid in blood 06/21/2016  . Recurrent boils 06/21/2016  . Pain in left knee 06/06/2016  . Allergic rhinitis 06/06/2016  . Abscess of vulva 06/06/2016  . Low back pain 06/06/2016  . Major depressive disorder, recurrent episode, moderate (HCC) 06/06/2016  . CKD (chronic kidney disease), stage III (HCC) 06/06/2016  . Diabetes (HCC) 05/25/2016  . Hypertension 05/25/2016  . RLS (restless legs syndrome) 05/25/2016  . Arthritis of hand, right 05/25/2016  . Sleep apnea 05/25/2016  . Prominent metatarsal head 01/01/2016  . Hammertoe 01/01/2016  . Numbness and tingling 09/08/2015  . 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 [place], [place], [address] Phone: [phone]   Fax:  [phone]  Name: Kim Thomas MRN: [id number] Date of Birth: [birth year]

## 2017-10-23 ENCOUNTER — Encounter: Payer: Self-pay | Admitting: Physical Therapy

## 2017-10-23 ENCOUNTER — Ambulatory Visit: Payer: Medicare Other | Admitting: Physical Therapy

## 2017-10-23 DIAGNOSIS — M6281 Muscle weakness (generalized): Secondary | ICD-10-CM | POA: Diagnosis not present

## 2017-10-23 DIAGNOSIS — M25562 Pain in left knee: Secondary | ICD-10-CM

## 2017-10-23 DIAGNOSIS — R262 Difficulty in walking, not elsewhere classified: Secondary | ICD-10-CM

## 2017-10-23 NOTE — Therapy (Signed)
[place] [address], [place], [address] Phone: [phone]   Fax:  [phone]  Physical Therapy Treatment  Patient Details  Name: Kim Thomas MRN: [id number] Date of Birth: [birth year] Referring Provider: Latanya Maudlin, PA   Encounter Date: 10/23/2017  PT End of Session - 10/23/17 1600    Visit Number  12    Number of Visits  13    Date for PT Re-Evaluation  10/25/17    Authorization Type  12    Authorization Time Period  20 (G code)    PT Start Time  1330    PT Stop Time  1430    PT Time Calculation (min)  60 min    Activity Tolerance  Patient tolerated treatment well    Behavior During Therapy  [facility] for tasks assessed/performed       Past Medical History:  Diagnosis Date  . Anxiety   . Chronic kidney disease   . DDD (degenerative disc disease), cervical   . Diabetes mellitus without complication (HCC)   . Gout   . Hypertension   . Sleep apnea     Past Surgical History:  Procedure Laterality Date  . BREAST BIOPSY Left    benign  . BREAST CYST EXCISION Left   . c section     x2  . CHOLECYSTECTOMY    . FOOT SURGERY Bilateral   . KNEE ARTHROSCOPY Left 07/18/2016   Procedure: ARTHROSCOPY KNEE;  Surgeon: Donato Heinz, MD;  Location: [facility];  Service: Orthopedics;  Laterality: Left;  . KNEE ARTHROSCOPY WITH LATERAL MENISECTOMY Left 07/18/2016   Procedure: KNEE ARTHROSCOPY WITH PARTIAL  LATERAL MENISECTOMY, CHONDROPLASTY LATERAL FEMORAL CONDYLE;  Surgeon: Donato Heinz, MD;  Location: [facility];  Service: Orthopedics;  Laterality: Left;  . SPINE SURGERY  11/09/11   C5  . TUBAL LIGATION      There were no vitals filed for this visit.  Subjective Assessment - 10/23/17 1332    Subjective  Patient has been doing a lot of stairs over the weekend and feels she is stronger and able to do more. She is very pleased with her progress with therapy and feels the electrical stimulation has  helped a great deal.    Pertinent History  PMH: previous LBP, ACDF C6-7, HTN, diabetes, stage III kidney disease, 07/2016 arthroscopy left knee and noticed worsening symptoms about a year later    Limitations  Sitting;Standing;Walking;House hold activities    How long can you sit comfortably?  without crossing legs: 15 min.    How long can you stand comfortably?  10 min.    How long can you walk comfortably?  intermediate distances    Patient Stated Goals  be able to walk for exercise without difficulty    Currently in Pain?  No/denies       Objective: Gait: independent, no deviations noted Stairs: reciprocal pattern  Strength: left knee extension 4/5 as compared to right 5/5  Treatment: Modalities: Electrical stimulation:74min.Russian stim. 10/10 cycle applied (2) electrodes to quadriceps/VMO left LE (28ma with full contraction noted)with patient reclined with LE supported on pillow; pt. Performing quad sets/knee extension with each cycle; goal muscle re education High volt estim.clincial program for muscle spasms (2) electrodes applied to medial and lateral aspect of left kneeintensity to tolerance with patient in reclined position with LE supported on pillow goal: pain, spasms  Therapeutic exercise: patient performed with demonstration, VC, tactile cues of therapist: goal: independent  with home program, improve LEFS, strength Leg press: bilateral LE's 45#andthen single leg 45# x 15 reps each, 65#x 25 reps Balance airex beam x 2 min. Side stepping Hip rotary machine: each LE:  Hip extension 85# 1 x 15, 100# 1 x 15 reps each Hip abduction 55# 1 x 15, 70# 1 x 15 reps each  NuStep x312min independent level #5(unbilled time); atbeginningof session  Patient response to treatment:improved technique andmotor control with exercises with minimal demonstration and verbal cuing.Improved quadriceps contraction with estim.    PT Education - 10/23/17 1500    Education  provided  Yes    Education Details  exercise instruction for proper intensity and technique    Person(s) Educated  Patient    Methods  Explanation;Demonstration;Verbal cues    Comprehension  Returned demonstration;Verbalized understanding;Verbal cues required          PT Long Term Goals - 10/16/17 1517      PT LONG TERM GOAL #1   Title  patient will demonstrate indpendence with home program for pain control and progressive exercise to allow transition to self managment once discharged from physical therapy    Baseline  requires cuing  and has limited knowledge of appropriate exercises and progression    Status  On-going    Target Date  10/25/17      PT LONG TERM GOAL #2   Title  improved LEFS to 55/80 or better indicating improvement in function with left LE for daily activities    Baseline  LEFS 42/80    Status  On-going    Target Date  10/25/17            Plan - 10/23/17 1500    Clinical Impression Statement  Patient continues to progress well towards goals with decreased pain and improving function with daily activities.     Rehab Potential  Good    Clinical Impairments Affecting Rehab Potential  (-) lives alone, diabetes, chronic condition, arthritis (+) highly motivated, family support    PT Frequency  2x / week    PT Duration  6 weeks    PT Treatment/Interventions  Electrical Stimulation;Cryotherapy;Moist Heat;Gait training;Patient/family education;Neuromuscular re-education;Therapeutic exercise;Balance training;Manual techniques    PT Next Visit Plan  re assess LEFS, goals and plan discharge    PT Home Exercise Plan  stabilization in sitting for hip adduction with ball and glute sets, hip abduction with resistive band       Patient will benefit from skilled therapeutic intervention in order to improve the following deficits and impairments:  Decreased strength, Pain, Impaired perceived functional ability, Decreased activity tolerance, Decreased endurance, Difficulty  walking  Visit Diagnosis: Muscle weakness (generalized)  Left knee pain, unspecified chronicity  Difficulty in walking, not elsewhere classified     Problem List Patient Active Problem List   Diagnosis Date Noted  . Polypharmacy 06/30/2017  . Advanced care planning/counseling discussion 02/20/2017  . Risk for falls 02/20/2017  . Pain management 02/20/2017  . Insomnia 06/21/2016  . Elevated uric acid in blood 06/21/2016  . Recurrent boils 06/21/2016  . Pain in left knee 06/06/2016  . Allergic rhinitis 06/06/2016  . Abscess of vulva 06/06/2016  . Low back pain 06/06/2016  . Major depressive disorder, recurrent episode, moderate (HCC) 06/06/2016  . CKD (chronic kidney disease), stage III (HCC) 06/06/2016  . Diabetes (HCC) 05/25/2016  . Hypertension 05/25/2016  . RLS (restless legs syndrome) 05/25/2016  . Arthritis of hand, right 05/25/2016  . Sleep apnea 05/25/2016  . Prominent metatarsal  head 01/01/2016  . Hammertoe 01/01/2016  . Numbness and tingling 09/08/2015  . 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 [place], [place], [address] Phone: [phone]   Fax:  [phone]  Name: Kim Thomas MRN: [id number] Date of Birth: 25-Jun-1956

## 2017-10-25 ENCOUNTER — Ambulatory Visit: Payer: Medicare Other | Admitting: Physical Therapy

## 2017-10-25 ENCOUNTER — Encounter: Payer: Self-pay | Admitting: Physical Therapy

## 2017-10-25 ENCOUNTER — Other Ambulatory Visit: Payer: Self-pay

## 2017-10-25 DIAGNOSIS — R262 Difficulty in walking, not elsewhere classified: Secondary | ICD-10-CM

## 2017-10-25 DIAGNOSIS — M6281 Muscle weakness (generalized): Secondary | ICD-10-CM | POA: Diagnosis not present

## 2017-10-25 DIAGNOSIS — M25562 Pain in left knee: Secondary | ICD-10-CM

## 2017-10-25 NOTE — Therapy (Signed)
[facility] [address], [place], [address] Phone: [phone]   Fax:  [phone]  Physical Therapy Treatment and Discharge  Patient Details  Name: Kim Thomas MRN: [id number] Date of Birth: 06/11/1956 Referring Provider: Regino Bellow, PA   Encounter Date: 10/25/2017  PT End of Session - 10/25/17 1351    Visit Number  13    Number of Visits  13    Date for PT Re-Evaluation  10/25/17    Authorization Type  13    Authorization Time Period  20 (G code)    PT Start Time  1350    PT Stop Time  1441    PT Time Calculation (min)  51 min    Activity Tolerance  Patient tolerated treatment well    Behavior During Therapy  [facility] for tasks assessed/performed       Past Medical History:  Diagnosis Date  . Anxiety   . Chronic kidney disease   . DDD (degenerative disc disease), cervical   . Diabetes mellitus without complication ([place])   . Gout   . Hypertension   . Sleep apnea     Past Surgical History:  Procedure Laterality Date  . BREAST BIOPSY Left    benign  . BREAST CYST EXCISION Left   . c section     x2  . CHOLECYSTECTOMY    . FOOT SURGERY Bilateral   . KNEE ARTHROSCOPY Left 07/18/2016   Procedure: ARTHROSCOPY KNEE;  Surgeon: Dereck Leep, MD;  Location: [facility] ORS;  Service: Orthopedics;  Laterality: Left;  . KNEE ARTHROSCOPY WITH LATERAL MENISECTOMY Left 07/18/2016   Procedure: KNEE ARTHROSCOPY WITH PARTIAL  LATERAL MENISECTOMY, CHONDROPLASTY LATERAL FEMORAL CONDYLE;  Surgeon: Dereck Leep, MD;  Location: [facility] ORS;  Service: Orthopedics;  Laterality: Left;  . SPINE SURGERY  11/09/11   C5  . TUBAL LIGATION      There were no vitals filed for this visit.  Subjective Assessment - 10/25/17 1400    Subjective  Pt has been completing her HEP 2x/day without questions or concerns.  No new complaints or concerns.  Pt reports she is doing well and is prepared for d/c this date.      Pertinent  History  PMH: previous LBP, ACDF C6-7, HTN, diabetes, stage III kidney disease, 07/2016 arthroscopy left knee and noticed worsening symptoms about a year later    Limitations  Sitting;Standing;Walking;House hold activities    How long can you sit comfortably?  LLE does not limit sitting    How long can you stand comfortably?  30 minutes    How long can you walk comfortably?  1/2 mile    Patient Stated Goals  Pt now able to walk for exercise without difficulty    Currently in Pain?  No/denies       LEFS: 50   Treatment:   Modalities:  Electrical stimulation: 10?min. Russian stim. 10/10 cycle applied (2) electrodes to quadriceps/VMO left LE, 46m with full contraction noted; pt. Performing quad sets/knee extension with each cycle; goal muscle re education  High volt estim. clincial program for muscle spasms (2) electrodes applied to medial and lateral aspect of left knee?intensity to tolerance with patient in reclined position, goal: pain, spasms  ??     Therapeutic exercise: patient performed with demonstration, VC, tactile cues of therapist: goal: independent with home program, strength   Leg press: bilateral LE's 45# x20 and?then single leg 45# x 15 reps each,?65#?x 25  reps with BLE, 65# with single leg x15 each  Lunges with TRX 2x5 each LE?  Matrix hip abd 55# x15 each LE  Matrix hip E 55# x15 each LE                PT Education - 2017-11-07 1350    Education provided  Yes    Education Details  Discharge planning today, continue with HEP as prescribed, all questions answered    Person(s) Educated  Patient    Methods  Explanation;Demonstration;Verbal cues    Comprehension  Verbalized understanding;Returned demonstration;Verbal cues required          PT Long Term Goals - November 07, 2017 1423      PT LONG TERM GOAL #1   Title  patient will demonstrate indpendence with home program for pain control and progressive exercise to allow transition to self managment once  discharged from physical therapy    Baseline  Pt performing HEP independently without questions or concerns    Status  Achieved      PT LONG TERM GOAL #2   Title  improved LEFS to 55/80 or better indicating improvement in function with left LE for daily activities    Baseline  LEFS 50/80    Status  -- Adequate for d/c            Plan - 2017/11/07 1424    Clinical Impression Statement  Pt presented today for her last session with PT and was prepared for discharge on this date.  She reports she is doing well and has been performing her HEP 2x/day without questions or concerns.  She is planning to continue with HEP and use gym at apartment complex to compliment HEP.  She has met her goal of independence with HEP, denies any pain, and has made significant improvement on LEFS.  The pt will be discharged today.      Rehab Potential  Good    Clinical Impairments Affecting Rehab Potential  (-) lives alone, diabetes, chronic condition, arthritis (+) highly motivated, family support    PT Frequency  -- d/c today    PT Treatment/Interventions  Electrical Stimulation;Cryotherapy;Moist Heat;Gait training;Patient/family education;Neuromuscular re-education;Therapeutic exercise;Balance training;Manual techniques    PT Next Visit Plan  pt to be d/c today    PT Home Exercise Plan  stabilization in sitting for hip adduction with ball and glute sets, hip abduction with resistive band    Consulted and Agree with Plan of Care  Patient       Patient will benefit from skilled therapeutic intervention in order to improve the following deficits and impairments:  Decreased strength, Pain, Impaired perceived functional ability, Decreased activity tolerance, Decreased endurance, Difficulty walking  Visit Diagnosis: Muscle weakness (generalized)  Left knee pain, unspecified chronicity  Difficulty in walking, not elsewhere classified   G-Codes - November 07, 2017 1435    Functional Assessment Tool Used (Outpatient  Only)  LEFS, clinical judgement, exercise tolerance    Functional Limitation  Mobility: Walking and moving around    Mobility: Walking and Moving Around Current Status ([id number])  At least 1 percent but less than 20 percent impaired, limited or restricted    Mobility: Walking and Moving Around Goal Status [id number])  At least 1 percent but less than 20 percent impaired, limited or restricted    Mobility: Walking and Moving Around Discharge Status [id number])  At least 1 percent but less than 20 percent impaired, limited or restricted       Problem List Patient Active Problem List  Diagnosis Date Noted  . Polypharmacy 06/30/2017  . Advanced care planning/counseling discussion 02/20/2017  . Risk for falls 02/20/2017  . Pain management 02/20/2017  . Insomnia 06/21/2016  . Elevated uric acid in blood 06/21/2016  . Recurrent boils 06/21/2016  . Pain in left knee 06/06/2016  . Allergic rhinitis 06/06/2016  . Abscess of vulva 06/06/2016  . Low back pain 06/06/2016  . Major depressive disorder, recurrent episode, moderate ([place]) 06/06/2016  . CKD (chronic kidney disease), stage III ([place]) 06/06/2016  . Diabetes ([place]) 05/25/2016  . Hypertension 05/25/2016  . RLS (restless legs syndrome) 05/25/2016  . Arthritis of hand, right 05/25/2016  . Sleep apnea 05/25/2016  . Prominent metatarsal head 01/01/2016  . Hammertoe 01/01/2016  . Numbness and tingling 09/08/2015  . 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    Collie Siad PT, DPT 10/25/2017, 2:37 PM  [facility] [address] [place], [address] Phone: [phone]   Fax:  [phone]  Name: Kaylla Cobos MRN: [id number] Date of Birth: 1956-03-07

## 2017-11-03 ENCOUNTER — Ambulatory Visit (INDEPENDENT_AMBULATORY_CARE_PROVIDER_SITE_OTHER): Payer: Medicare Other | Admitting: Unknown Physician Specialty

## 2017-11-03 ENCOUNTER — Encounter: Payer: Self-pay | Admitting: Unknown Physician Specialty

## 2017-11-03 ENCOUNTER — Other Ambulatory Visit: Payer: Self-pay

## 2017-11-03 VITALS — BP 127/82 | HR 103 | Temp 98.4°F | Wt 196.4 lb

## 2017-11-03 DIAGNOSIS — E119 Type 2 diabetes mellitus without complications: Secondary | ICD-10-CM | POA: Diagnosis not present

## 2017-11-03 DIAGNOSIS — N183 Chronic kidney disease, stage 3 unspecified: Secondary | ICD-10-CM

## 2017-11-03 DIAGNOSIS — I10 Essential (primary) hypertension: Secondary | ICD-10-CM | POA: Diagnosis not present

## 2017-11-03 LAB — MICROALBUMIN, URINE WAIVED
Creatinine, Urine Waived: 100 mg/dL (ref 10–300)
Microalb, Ur Waived: 10 mg/L (ref 0–19)
Microalb/Creat Ratio: <30 mg/g (ref <30)

## 2017-11-03 MED ORDER — LISINOPRIL 2.5 MG PO TABS: 2.5000 mg | ORAL_TABLET | Freq: Every day | ORAL | 1 refills | Status: DC

## 2017-11-03 NOTE — Assessment & Plan Note (Signed)
Decrease Lisinopril to 2.5 mg due to dizzyness.

## 2017-11-03 NOTE — Telephone Encounter (Signed)
Error

## 2017-11-03 NOTE — Progress Notes (Signed)
BP 127/82   Pulse (!) 103   Temp 98.4 F (36.9 C) (Oral)   Wt 196 lb 6.4 oz (89.1 kg)   LMP  (LMP Unknown)   SpO2 99%   BMI 32.68 kg/m    Subjective:    Patient ID: Kim Thomas, female    DOB: [birth year], 61 y.o.   MRN: [id number]  HPI: Kim Thomas is a 61 y.o. female  Chief Complaint  Patient presents with  . Hypertension    4 week f/up  . Blood Sugar Problem    pt states that her blood sugars have been dropping after eating breakfast and it makes her feel jittery   Hypertension Added Lisinopril last visit due to CKD.  She thinks this might be due to not drinking enough water Average home BPs not checking, but feeling faint when she bends over with heart palpitatio No chest pain with exertion or shortness of breath No Edema  Diabetes Blood sugar 128-132 but gets jittery mid morning with a blood sugar of 98.  Also states her blood sugar is around 100 in the middle of the day.  Last Hgb AiC was 6.5%.  She thinks the Cinammon twice a day is really helping    Relevant past medical, surgical, family and social history reviewed and updated as indicated. Interim medical history since our last visit reviewed. Allergies and medications reviewed and updated.  Review of Systems  All other systems reviewed and are negative.   Per HPI unless specifically indicated above     Objective:    BP 127/82   Pulse (!) 103   Temp 98.4 F (36.9 C) (Oral)   Wt 196 lb 6.4 oz (89.1 kg)   LMP  (LMP Unknown)   SpO2 99%   BMI 32.68 kg/m   Wt Readings from Last 3 Encounters:  11/03/17 196 lb 6.4 oz (89.1 kg)  10/02/17 195 lb 12.8 oz (88.8 kg)  06/30/17 194 lb (88 kg)    Physical Exam  Constitutional: She is oriented to person, place, and time. She appears well-developed and well-nourished. No distress.  HENT:  Head: Normocephalic and atraumatic.  Eyes: Conjunctivae and lids are normal. Right eye exhibits no discharge. Left eye exhibits no discharge. No scleral icterus.  Neck:  Normal range of motion. Neck supple. No JVD present. Carotid bruit is not present.  Cardiovascular: Normal rate, regular rhythm and normal heart sounds.  Pulmonary/Chest: Effort normal and breath sounds normal.  Abdominal: Normal appearance. There is no splenomegaly or hepatomegaly.  Musculoskeletal: Normal range of motion.  Neurological: She is alert and oriented to person, place, and time.  Skin: Skin is warm, dry and intact. No rash noted. No pallor.  Psychiatric: She has a normal mood and affect. Her behavior is normal. Judgment and thought content normal.    Results for orders placed or performed in visit on 10/02/17  Comprehensive metabolic panel  Result Value Ref Range   Glucose 83 65 - 99 mg/dL   BUN 25 8 - 27 mg/dL   Creatinine, Ser 4.091.42 (H) 0.57 - 1.00 mg/dL   GFR calc non Af Amer 40 (L) >59 mL/min/1.73   GFR calc Af Amer 46 (L) >59 mL/min/1.73   BUN/Creatinine Ratio 18 12 - 28   Sodium 139 134 - 144 mmol/L   Potassium 5.6 (H) 3.5 - 5.2 mmol/L   Chloride 103 96 - 106 mmol/L   CO2 20 20 - 29 mmol/L   Calcium 10.2 8.7 - 10.3 mg/dL  Total Protein 7.8 6.0 - 8.5 g/dL   Albumin 4.5 3.6 - 4.8 g/dL   Globulin, Total 3.3 1.5 - 4.5 g/dL   Albumin/Globulin Ratio 1.4 1.2 - 2.2   Bilirubin Total <0.2 0.0 - 1.2 mg/dL   Alkaline Phosphatase 74 39 - 117 IU/L   AST 25 0 - 40 IU/L   ALT 26 0 - 32 IU/L  Bayer DCA Hb A1c Waived  Result Value Ref Range   Bayer DCA Hb A1c Waived 6.5 <7.0 %      Assessment & Plan:   Problem List Items Addressed This Visit      Unprioritized   CKD (chronic kidney disease), stage III (HCC) - Primary   Relevant Orders   Microalbumin, Urine Waived   Comprehensive metabolic panel   Diabetes (HCC)    Blood sugar under good control.  Stop Metformin due to low blood sugar      Relevant Medications   lisinopril (PRINIVIL,ZESTRIL) 2.5 MG tablet   Hypertension    Decrease Lisinopril to 2.5 mg due to dizzyness.        Relevant Medications    lisinopril (PRINIVIL,ZESTRIL) 2.5 MG tablet       Follow up plan: Return in about 6 weeks (around 12/15/2017).

## 2017-11-03 NOTE — Assessment & Plan Note (Signed)
Blood sugar under good control.  Stop Metformin due to low blood sugar

## 2017-11-04 LAB — COMPREHENSIVE METABOLIC PANEL
ALT: 20 IU/L (ref 0–32)
AST: 24 IU/L (ref 0–40)
Albumin/Globulin Ratio: 1.4 (ref 1.2–2.2)
Albumin: 4.2 g/dL (ref 3.6–4.8)
Alkaline Phosphatase: 71 IU/L (ref 39–117)
BUN/Creatinine Ratio: 22 (ref 12–28)
BUN: 32 mg/dL — ABNORMAL HIGH (ref 8–27)
Bilirubin Total: <0.2 mg/dL (ref 0.0–1.2)
CO2: 21 mmol/L (ref 20–29)
Calcium: 9.7 mg/dL (ref 8.7–10.3)
Chloride: 106 mmol/L (ref 96–106)
Creatinine, Ser: 1.44 mg/dL — ABNORMAL HIGH (ref 0.57–1.00)
GFR calc Af Amer: 45 mL/min/{1.73_m2} — ABNORMAL LOW (ref >59)
GFR calc non Af Amer: 39 mL/min/{1.73_m2} — ABNORMAL LOW (ref >59)
Globulin, Total: 3.1 g/dL (ref 1.5–4.5)
Glucose: 86 mg/dL (ref 65–99)
Potassium: 5.5 mmol/L — ABNORMAL HIGH (ref 3.5–5.2)
Sodium: 140 mmol/L (ref 134–144)
Total Protein: 7.3 g/dL (ref 6.0–8.5)

## 2017-11-07 ENCOUNTER — Other Ambulatory Visit: Payer: Self-pay | Admitting: Unknown Physician Specialty

## 2017-11-07 DIAGNOSIS — N183 Chronic kidney disease, stage 3 unspecified: Secondary | ICD-10-CM

## 2017-11-28 HISTORY — PX: EYE SURGERY: SHX253

## 2017-12-22 ENCOUNTER — Encounter: Payer: Self-pay | Admitting: Unknown Physician Specialty

## 2017-12-22 ENCOUNTER — Ambulatory Visit (INDEPENDENT_AMBULATORY_CARE_PROVIDER_SITE_OTHER): Payer: Medicare Other | Admitting: Unknown Physician Specialty

## 2017-12-22 VITALS — BP 132/75 | HR 93 | Temp 98.3°F | Wt 198.8 lb

## 2017-12-22 DIAGNOSIS — N183 Chronic kidney disease, stage 3 unspecified: Secondary | ICD-10-CM

## 2017-12-22 DIAGNOSIS — E119 Type 2 diabetes mellitus without complications: Secondary | ICD-10-CM

## 2017-12-22 DIAGNOSIS — I10 Essential (primary) hypertension: Secondary | ICD-10-CM | POA: Diagnosis not present

## 2017-12-22 DIAGNOSIS — G2581 Restless legs syndrome: Secondary | ICD-10-CM

## 2017-12-22 LAB — BAYER DCA HB A1C WAIVED: HB A1C (BAYER DCA - WAIVED): 5.9 % (ref <7.0)

## 2017-12-22 NOTE — Assessment & Plan Note (Signed)
Hgb A1C is 5.9%.  Planning on cutting out simple carbs and will restart exercising.

## 2017-12-22 NOTE — Assessment & Plan Note (Addendum)
Recent check with Nephrology.  Notes reviewed and is stable.  No need to check today

## 2017-12-22 NOTE — Assessment & Plan Note (Signed)
Restart Gabapentin.  She will titrate her medications as needed.

## 2017-12-22 NOTE — Progress Notes (Signed)
BP 132/75   Pulse 93   Temp 98.3 F (36.8 C) (Oral)   Wt 198 lb 12.8 oz (90.2 kg)   LMP  (LMP Unknown)   SpO2 99%   BMI 33.08 kg/m    Subjective:    Patient ID: Kim Thomas, female    DOB: 1956-06-03, 62 y.o.   MRN: [id number]  HPI: Kim Thomas is a 62 y.o. female  Chief Complaint  Patient presents with  . Diabetes    6 week f/up  . Hypertension    6 week f/up   Pt states she went to go to [facility] following a stumble following a night of leg cramping.  X-ray looked OK and is getting a MRI due to suspicion of a torn meniscus.    She was given prednisone by Orthopedics.    Diabetes: Off of DM meds as she has been stable taking Cinnamon.   No hypoglycemic episodes No hyperglycemic episodes Feet problems: none Blood Sugars averaging: eye exam within last year Last Hgb A1C: 6.5  Hypertension  Using medications without difficulty Average home BPs   Using medication without problems or lightheadedness No chest pain with exertion or shortness of breath No Edema  RLS One reason for the spasms as stopped her Gabapentin due to concerns with her kidneys. Gabapentin was controlling her symptoms.     Relevant past medical, surgical, family and social history reviewed and updated as indicated. Interim medical history since our last visit reviewed. Allergies and medications reviewed and updated.  Review of Systems  Constitutional: Negative.   Respiratory: Negative.   Cardiovascular: Negative.   Psychiatric/Behavioral: Negative.     Per HPI unless specifically indicated above     Objective:    BP 132/75   Pulse 93   Temp 98.3 F (36.8 C) (Oral)   Wt 198 lb 12.8 oz (90.2 kg)   LMP  (LMP Unknown)   SpO2 99%   BMI 33.08 kg/m   Wt Readings from Last 3 Encounters:  12/22/17 198 lb 12.8 oz (90.2 kg)  11/03/17 196 lb 6.4 oz (89.1 kg)  10/02/17 195 lb 12.8 oz (88.8 kg)    Physical Exam  Constitutional: She is oriented to person, place, and time. She  appears well-developed and well-nourished. No distress.  HENT:  Head: Normocephalic and atraumatic.  Eyes: Conjunctivae and lids are normal. Right eye exhibits no discharge. Left eye exhibits no discharge. No scleral icterus.  Neck: Normal range of motion. Neck supple. No JVD present. Carotid bruit is not present.  Cardiovascular: Normal rate, regular rhythm and normal heart sounds.  Pulmonary/Chest: Effort normal and breath sounds normal.  Abdominal: Normal appearance. There is no splenomegaly or hepatomegaly.  Musculoskeletal: Normal range of motion.  Neurological: She is alert and oriented to person, place, and time.  Skin: Skin is warm, dry and intact. No rash noted. No pallor.  Psychiatric: She has a normal mood and affect. Her behavior is normal. Judgment and thought content normal.    Results for orders placed or performed in visit on 11/03/17  Comprehensive metabolic panel  Result Value Ref Range   Glucose 86 65 - 99 mg/dL   BUN 32 (H) 8 - 27 mg/dL   Creatinine, Ser 4.091.44 (H) 0.57 - 1.00 mg/dL   GFR calc non Af Amer 39 (L) >59 mL/min/1.73   GFR calc Af Amer 45 (L) >59 mL/min/1.73   BUN/Creatinine Ratio 22 12 - 28   Sodium 140 134 - 144 mmol/L   Potassium  5.5 (H) 3.5 - 5.2 mmol/L   Chloride 106 96 - 106 mmol/L   CO2 21 20 - 29 mmol/L   Calcium 9.7 8.7 - 10.3 mg/dL   Total Protein 7.3 6.0 - 8.5 g/dL   Albumin 4.2 3.6 - 4.8 g/dL   Globulin, Total 3.1 1.5 - 4.5 g/dL   Albumin/Globulin Ratio 1.4 1.2 - 2.2   Bilirubin Total <0.2 0.0 - 1.2 mg/dL   Alkaline Phosphatase 71 39 - 117 IU/L   AST 24 0 - 40 IU/L   ALT 20 0 - 32 IU/L  Microalbumin, Urine Waived  Result Value Ref Range   Microalb, Ur Waived 10 0 - 19 mg/L   Creatinine, Urine Waived 100 10 - 300 mg/dL   Microalb/Creat Ratio <30 <30 mg/g      Assessment & Plan:   Problem List Items Addressed This Visit      Unprioritized   CKD (chronic kidney disease), stage III (HCC)    Recent check with Nephrology.  Notes  reviewed and is stable.  No need to check today      Diabetes (HCC) - Primary    Hgb A1C is 5.9%.  Planning on cutting out simple carbs and will restart exercising.        Relevant Orders   Bayer DCA Hb A1c Waived   Hypertension    Stable, continue present medications.        RLS (restless legs syndrome)    Restart Gabapentin.  She will titrate her medications as needed.            Follow up plan: Return in about 3 months (around 03/22/2018).

## 2017-12-22 NOTE — Assessment & Plan Note (Signed)
Stable, continue present medications.   

## 2017-12-23 ENCOUNTER — Other Ambulatory Visit: Payer: Self-pay | Admitting: Physician Assistant

## 2017-12-23 DIAGNOSIS — M25561 Pain in right knee: Secondary | ICD-10-CM

## 2017-12-23 DIAGNOSIS — S83271A Complex tear of lateral meniscus, current injury, right knee, initial encounter: Secondary | ICD-10-CM

## 2017-12-27 ENCOUNTER — Other Ambulatory Visit: Payer: Medicare Other

## 2018-01-03 ENCOUNTER — Ambulatory Visit
Admission: RE | Admit: 2018-01-03 | Discharge: 2018-01-03 | Disposition: A | Discharge status: Home / Self Care | Payer: Medicare Other | Source: Ambulatory Visit | Attending: Physician Assistant | Admitting: Physician Assistant

## 2018-01-03 DIAGNOSIS — S83271A Complex tear of lateral meniscus, current injury, right knee, initial encounter: Secondary | ICD-10-CM

## 2018-01-03 DIAGNOSIS — M25561 Pain in right knee: Secondary | ICD-10-CM

## 2018-01-09 ENCOUNTER — Telehealth: Payer: Self-pay | Admitting: Unknown Physician Specialty

## 2018-01-09 NOTE — Telephone Encounter (Signed)
Copied from CRM [id number]. Topic: Quick Communication - See Telephone Encounter >> Jan 09, 2018  9:54 AM Rudi CocoLathan, Ayan Heffington M, NT wrote: CRM for notification. See Telephone encounter for:   01/09/18. Pt. Calling to check to see if she needs to schedule another appt. For her AWV now or can she just do during her 3 month follow up visit on 03/23/18 appt. Pt. Can be reached at [phone]

## 2018-01-24 ENCOUNTER — Other Ambulatory Visit: Payer: Self-pay

## 2018-01-24 MED ORDER — ONETOUCH ULTRA MINI W/DEVICE KIT: PACK | 12 refills | Status: DC

## 2018-01-24 NOTE — Telephone Encounter (Signed)
Patient last seen 12/22/17 and has f/up 04/03/18.

## 2018-01-26 ENCOUNTER — Other Ambulatory Visit: Payer: Self-pay | Admitting: Unknown Physician Specialty

## 2018-02-09 ENCOUNTER — Telehealth: Payer: Self-pay | Admitting: Unknown Physician Specialty

## 2018-02-09 NOTE — Telephone Encounter (Signed)
Copied from CRM [id number]. Topic: Referral - Status >> Feb 09, 2018 10:21 AM Cecelia ByarsGreen, Temeka L, RMA wrote: Reason for CRM: Patient is requesting a referral for a mammogram in the [place] area, please return pt call

## 2018-02-12 ENCOUNTER — Other Ambulatory Visit: Payer: Self-pay | Admitting: Unknown Physician Specialty

## 2018-02-12 ENCOUNTER — Telehealth: Payer: Self-pay

## 2018-02-12 DIAGNOSIS — N644 Mastodynia: Secondary | ICD-10-CM

## 2018-02-12 NOTE — Telephone Encounter (Signed)
Copied from CRM [phone]. Topic: Referral - Status >> Feb 09, 2018 10:21 AM Cecelia ByarsGreen, Temeka L, RMA wrote: Reason for CRM: Patient is requesting a referral for a mammogram, please return pt call   >> Feb 12, 2018  9:02 AM Percival SpanishKennedy, Cheryl W wrote:  Pt is [facility] on [facility] [place]. Pt said she is having issues with her left breast and is needing to have a quick visit. Would like to be seen as soon as possible at the [facility] . Pt requesting a call back today [phone]     Called and spoke to patient. She states she is having discomfort in her left breast. Describes the feeling as hurt and discomfort. States she feels this when she takes her bra off and first noticed it Thursday night. Patient would like an order for a mammogram to have this checked out.

## 2018-02-12 NOTE — Telephone Encounter (Signed)
I put in order for mammogram.  I don't know how to send to location required by patient.  Can you make sure?  Thanks.  I also need to know what provider she would like to see.  Typically we refer to breast surgeon and I would recommend Dr. Birdie SonsByrnette.

## 2018-02-12 NOTE — Telephone Encounter (Signed)
Called and let patient know that order has been entered for a mammogram. Patient states she is going to call now to schedule.

## 2018-02-12 NOTE — Telephone Encounter (Signed)
See other phone encounter from today for further documentation please.

## 2018-02-14 ENCOUNTER — Ambulatory Visit: Payer: Medicare Other

## 2018-02-14 ENCOUNTER — Ambulatory Visit
Admission: RE | Admit: 2018-02-14 | Discharge: 2018-02-14 | Disposition: A | Discharge status: Home / Self Care | Payer: Medicare Other | Source: Ambulatory Visit | Attending: Unknown Physician Specialty | Admitting: Unknown Physician Specialty

## 2018-02-14 DIAGNOSIS — N644 Mastodynia: Secondary | ICD-10-CM

## 2018-02-27 LAB — HM DIABETES EYE EXAM

## 2018-03-23 ENCOUNTER — Ambulatory Visit: Payer: Medicare Other | Admitting: Unknown Physician Specialty

## 2018-03-23 ENCOUNTER — Encounter: Payer: Medicare Other | Admitting: Unknown Physician Specialty

## 2018-03-23 ENCOUNTER — Ambulatory Visit: Payer: Self-pay

## 2018-04-03 ENCOUNTER — Ambulatory Visit (INDEPENDENT_AMBULATORY_CARE_PROVIDER_SITE_OTHER): Payer: Medicare Other | Admitting: Unknown Physician Specialty

## 2018-04-03 ENCOUNTER — Encounter: Payer: Self-pay | Admitting: Unknown Physician Specialty

## 2018-04-03 VITALS — BP 120/68 | HR 83 | Temp 98.0°F | Wt 195.0 lb

## 2018-04-03 DIAGNOSIS — I10 Essential (primary) hypertension: Secondary | ICD-10-CM

## 2018-04-03 DIAGNOSIS — E0800 Diabetes mellitus due to underlying condition with hyperosmolarity without nonketotic hyperglycemic-hyperosmolar coma (NKHHC): Secondary | ICD-10-CM | POA: Diagnosis not present

## 2018-04-03 DIAGNOSIS — E785 Hyperlipidemia, unspecified: Secondary | ICD-10-CM | POA: Diagnosis not present

## 2018-04-03 DIAGNOSIS — N183 Chronic kidney disease, stage 3 unspecified: Secondary | ICD-10-CM

## 2018-04-03 DIAGNOSIS — E1169 Type 2 diabetes mellitus with other specified complication: Secondary | ICD-10-CM | POA: Insufficient documentation

## 2018-04-03 LAB — BAYER DCA HB A1C WAIVED: HB A1C (BAYER DCA - WAIVED): 7.3 % — ABNORMAL HIGH (ref <7.0)

## 2018-04-03 MED ORDER — TELMISARTAN-HCTZ 40-12.5 MG PO TABS: 1.0000 | ORAL_TABLET | Freq: Every day | ORAL | 1 refills | Status: DC

## 2018-04-03 MED ORDER — ATORVASTATIN CALCIUM 20 MG PO TABS: 20.0000 mg | ORAL_TABLET | Freq: Every day | ORAL | 3 refills | Status: DC

## 2018-04-03 NOTE — Assessment & Plan Note (Signed)
Hgb A1C is 7.3%.  Pt would like to work on her diet and exercise

## 2018-04-03 NOTE — Progress Notes (Signed)
BP 120/68   Pulse 83   Temp 98 F (36.7 C) (Oral)   Wt 195 lb (88.5 kg)   LMP  (LMP Unknown)   SpO2 97%   BMI 32.45 kg/m    Subjective:    Patient ID: Kim Thomas, female    DOB: [birth year], 62 y.o.   MRN: [id number]  HPI: Kim Thomas is a 62 y.o. female  Chief Complaint  Patient presents with  . Depression  . Diabetes  . Hyperlipidemia  . Hypertension   Diabetes: Using medications without difficulties No hypoglycemic episodes No hyperglycemic episodes Blood Sugars averaging: "a little off" as everyone giving her mints due to cough eye exam within last year Last Hgb A1C: 5.7%  Hypertension  Pt with significant dry cough.  Thinks it's related to Lisinopril given to her by Nephrology Average home BPs not checking   Using medication without problems or lightheadedness No chest pain with exertion or shortness of breath States she has significant swelling both legs.  Worse at the end of the day.    Elevated Cholesterol No statins at this time but needs one.  "Eye doctor says cholesterol in my eyes" Diet: Exercise:  Depression Takes Duloxetine prn.   Depression screen [facility] 2/9 04/03/2018 10/02/2017 06/30/2017 06/02/2017 02/20/2017  Decreased Interest 0 0 3 0 1  Down, Depressed, Hopeless 0 0 0 0 0  PHQ - 2 Score 0 0 3 0 1  Altered sleeping 0 3 0 - 0  Tired, decreased energy 1 0 0 - 0  Change in appetite 0 0 0 - 0  Feeling bad or failure about yourself  0 0 0 - 0  Trouble concentrating 0 2 0 - 0  Moving slowly or fidgety/restless 0 0 - - 0  Suicidal thoughts 0 0 0 - 0  PHQ-9 Score - 1   Relevant past medical, surgical, family and social history reviewed and updated as indicated. Interim medical history since our last visit reviewed. Allergies and medications reviewed and updated.  Review of Systems  Per HPI unless specifically indicated above     Objective:    BP 120/68   Pulse 83   Temp 98 F (36.7 C) (Oral)   Wt 195 lb (88.5 kg)   LMP  (LMP Unknown)    SpO2 97%   BMI 32.45 kg/m   Wt Readings from Last 3 Encounters:  04/03/18 195 lb (88.5 kg)  12/22/17 198 lb 12.8 oz (90.2 kg)  11/03/17 196 lb 6.4 oz (89.1 kg)    Physical Exam  Constitutional: She is oriented to person, place, and time. She appears well-developed and well-nourished. No distress.  HENT:  Head: Normocephalic and atraumatic.  Eyes: Conjunctivae and lids are normal. Right eye exhibits no discharge. Left eye exhibits no discharge. No scleral icterus.  Neck: Normal range of motion. Neck supple. No JVD present. Carotid bruit is not present.  Cardiovascular: Normal rate, regular rhythm and normal heart sounds.  Pulmonary/Chest: Effort normal and breath sounds normal.  Abdominal: Normal appearance. There is no splenomegaly or hepatomegaly.  Musculoskeletal: Normal range of motion.  Neurological: She is alert and oriented to person, place, and time.  Skin: Skin is warm, dry and intact. No rash noted. No pallor.  Psychiatric: She has a normal mood and affect. Her behavior is normal. Judgment and thought content normal.    Results for orders placed or performed in visit on 12/22/17  Bayer DCA Hb A1c Waived  Result Value Ref Range  Bayer DCA Hb A1c Waived 5.9 <7.0 %      Assessment & Plan:   Problem List Items Addressed This Visit      Unprioritized   CKD (chronic kidney disease), stage III (HCC)    Stop Lisinopril and start Micardis      Diabetes (HCC) - Primary    Hgb A1C is 7.3%.  Pt would like to work on her diet and exercise      Relevant Medications   telmisartan-hydrochlorothiazide (MICARDIS HCT) 40-12.5 MG tablet   atorvastatin (LIPITOR) 20 MG tablet   Other Relevant Orders   Comprehensive metabolic panel   Bayer DCA Hb Z6X Waived   Hyperlipidemia    Start Atorvastatin for risk reduction.        Relevant Medications   telmisartan-hydrochlorothiazide (MICARDIS HCT) 40-12.5 MG tablet   atorvastatin (LIPITOR) 20 MG tablet   Hypertension    Stop  Lisinopril due to dry cough.  Start Micardis/HCTZ      Relevant Medications   telmisartan-hydrochlorothiazide (MICARDIS HCT) 40-12.5 MG tablet   atorvastatin (LIPITOR) 20 MG tablet       Follow up plan: Return in about 1 month (around 05/01/2018) for wellness visit.

## 2018-04-03 NOTE — Assessment & Plan Note (Signed)
Start Atorvastatin for risk reduction.

## 2018-04-03 NOTE — Assessment & Plan Note (Addendum)
Stop Lisinopril due to dry cough.  Start Micardis/HCTZ

## 2018-04-03 NOTE — Assessment & Plan Note (Signed)
Stop Lisinopril and start Micardis 

## 2018-04-04 ENCOUNTER — Encounter: Payer: Self-pay | Admitting: Unknown Physician Specialty

## 2018-04-04 LAB — COMPREHENSIVE METABOLIC PANEL
ALT: 20 IU/L (ref 0–32)
AST: 23 IU/L (ref 0–40)
Albumin/Globulin Ratio: 1.2 (ref 1.2–2.2)
Albumin: 3.7 g/dL (ref 3.6–4.8)
Alkaline Phosphatase: 62 IU/L (ref 39–117)
BUN/Creatinine Ratio: 16 (ref 12–28)
BUN: 18 mg/dL (ref 8–27)
Bilirubin Total: <0.2 mg/dL (ref 0.0–1.2)
CO2: 19 mmol/L — ABNORMAL LOW (ref 20–29)
Calcium: 9 mg/dL (ref 8.7–10.3)
Chloride: 109 mmol/L — ABNORMAL HIGH (ref 96–106)
Creatinine, Ser: 1.16 mg/dL — ABNORMAL HIGH (ref 0.57–1.00)
GFR calc Af Amer: 58 mL/min/{1.73_m2} — ABNORMAL LOW (ref >59)
GFR calc non Af Amer: 51 mL/min/{1.73_m2} — ABNORMAL LOW (ref >59)
Globulin, Total: 3 g/dL (ref 1.5–4.5)
Glucose: 112 mg/dL — ABNORMAL HIGH (ref 65–99)
Potassium: 4 mmol/L (ref 3.5–5.2)
Sodium: 143 mmol/L (ref 134–144)
Total Protein: 6.7 g/dL (ref 6.0–8.5)

## 2018-05-04 ENCOUNTER — Encounter: Payer: Self-pay | Admitting: Unknown Physician Specialty

## 2018-05-04 ENCOUNTER — Ambulatory Visit (INDEPENDENT_AMBULATORY_CARE_PROVIDER_SITE_OTHER): Payer: Medicare Other | Admitting: Unknown Physician Specialty

## 2018-05-04 ENCOUNTER — Telehealth: Payer: Self-pay | Admitting: *Deleted

## 2018-05-04 ENCOUNTER — Ambulatory Visit (INDEPENDENT_AMBULATORY_CARE_PROVIDER_SITE_OTHER): Payer: Medicare Other

## 2018-05-04 VITALS — BP 130/82 | HR 74 | Temp 98.3°F | Resp 16 | Ht 64.5 in | Wt 194.7 lb

## 2018-05-04 VITALS — BP 130/82 | HR 74 | Temp 98.3°F | Ht 64.5 in | Wt 194.7 lb

## 2018-05-04 DIAGNOSIS — Z114 Encounter for screening for human immunodeficiency virus [HIV]: Secondary | ICD-10-CM | POA: Diagnosis not present

## 2018-05-04 DIAGNOSIS — E785 Hyperlipidemia, unspecified: Secondary | ICD-10-CM

## 2018-05-04 DIAGNOSIS — E119 Type 2 diabetes mellitus without complications: Secondary | ICD-10-CM

## 2018-05-04 DIAGNOSIS — Z0000 Encounter for general adult medical examination without abnormal findings: Secondary | ICD-10-CM

## 2018-05-04 DIAGNOSIS — Z1231 Encounter for screening mammogram for malignant neoplasm of breast: Secondary | ICD-10-CM | POA: Diagnosis not present

## 2018-05-04 DIAGNOSIS — G4733 Obstructive sleep apnea (adult) (pediatric): Secondary | ICD-10-CM | POA: Diagnosis not present

## 2018-05-04 DIAGNOSIS — Z1239 Encounter for other screening for malignant neoplasm of breast: Secondary | ICD-10-CM

## 2018-05-04 DIAGNOSIS — I10 Essential (primary) hypertension: Secondary | ICD-10-CM

## 2018-05-04 NOTE — Patient Instructions (Addendum)
[name] , Thank you for taking time to come for your Medicare Wellness Visit. I appreciate your ongoing commitment to your health goals. Please review the following plan we discussed and let me know if I can assist you in the future.   Screening recommendations/referrals: Colonoscopy: completed 11/28/2013 Mammogram: completed 02/14/2018 Bone Density: due at age 62  Recommended yearly ophthalmology/optometry visit for glaucoma screening and checkup Recommended yearly dental visit for hygiene and checkup  Vaccinations: Influenza vaccine: up to date Pneumococcal vaccine: series due at age 62 Tdap vaccine: up to date Shingles vaccine: up to date    Advanced directives: Please bring a copy of your health care power of attorney and living will to the office at your convenience.  Conditions/risks identified: Recommend drinking at least 6-8 glasses of water a day   Next appointment: Follow up in one year for your annual wellness exam.    Preventive Care 65 Years and Older, Female Preventive care refers to lifestyle choices and visits with your health care provider that can promote health and wellness. What does preventive care include?  A yearly physical exam. This is also called an annual well check.  Dental exams once or twice a year.  Routine eye exams. Ask your health care provider how often you should have your eyes checked.  Personal lifestyle choices, including:  Daily care of your teeth and gums.  Regular physical activity.  Eating a healthy diet.  Avoiding tobacco and drug use.  Limiting alcohol use.  Practicing safe sex.  Taking low-dose aspirin every day.  Taking vitamin and mineral supplements as recommended by your health care provider. What happens during an annual well check? The services and screenings done by your health care provider during your annual well check will depend on your age, overall health, lifestyle risk factors, and family history of  disease. Counseling  Your health care provider may ask you questions about your:  Alcohol use.  Tobacco use.  Drug use.  Emotional well-being.  Home and relationship well-being.  Sexual activity.  Eating habits.  History of falls.  Memory and ability to understand (cognition).  Work and work Astronomerenvironment.  Reproductive health. Screening  You may have the following tests or measurements:  Height, weight, and BMI.  Blood pressure.  Lipid and cholesterol levels. These may be checked every 5 years, or more frequently if you are over 62 years old.  Skin check.  Lung cancer screening. You may have this screening every year starting at age 62 if you have a 30-pack-year history of smoking and currently smoke or have quit within the past 15 years.  Fecal occult blood test (FOBT) of the stool. You may have this test every year starting at age 62.  Flexible sigmoidoscopy or colonoscopy. You may have a sigmoidoscopy every 5 years or a colonoscopy every 10 years starting at age 62.  Hepatitis C blood test.  Hepatitis B blood test.  Sexually transmitted disease (STD) testing.  Diabetes screening. This is done by checking your blood sugar (glucose) after you have not eaten for a while (fasting). You may have this done every 1-3 years.  Bone density scan. This is done to screen for osteoporosis. You may have this done starting at age 665.  Mammogram. This may be done every 1-2 years. Talk to your health care provider about how often you should have regular mammograms. Talk with your health care provider about your test results, treatment options, and if necessary, the need for more tests.  Vaccines  Your health care provider may recommend certain vaccines, such as:  Influenza vaccine. This is recommended every year.  Tetanus, diphtheria, and acellular pertussis (Tdap, Td) vaccine. You may need a Td booster every 10 years.  Zoster vaccine. You may need this after age  35.  Pneumococcal 13-valent conjugate (PCV13) vaccine. One dose is recommended after age 45.  Pneumococcal polysaccharide (PPSV23) vaccine. One dose is recommended after age 34. Talk to your health care provider about which screenings and vaccines you need and how often you need them. This information is not intended to replace advice given to you by your health care provider. Make sure you discuss any questions you have with your health care provider. Document Released: 12/11/2015 Document Revised: 08/03/2016 Document Reviewed: 09/15/2015 Elsevier Interactive Patient Education  2017 [employer] Prevention in the Home Falls can cause injuries. They can happen to people of all ages. There are many things you can do to make your home safe and to help prevent falls. What can I do on the outside of my home?  Regularly fix the edges of walkways and driveways and fix any cracks.  Remove anything that might make you trip as you walk through a door, such as a raised step or threshold.  Trim any bushes or trees on the path to your home.  Use bright outdoor lighting.  Clear any walking paths of anything that might make someone trip, such as rocks or tools.  Regularly check to see if handrails are loose or broken. Make sure that both sides of any steps have handrails.  Any raised decks and porches should have guardrails on the edges.  Have any leaves, snow, or ice cleared regularly.  Use sand or salt on walking paths during winter.  Clean up any spills in your garage right away. This includes oil or grease spills. What can I do in the bathroom?  Use night lights.  Install grab bars by the toilet and in the tub and shower. Do not use towel bars as grab bars.  Use non-skid mats or decals in the tub or shower.  If you need to sit down in the shower, use a plastic, non-slip stool.  Keep the floor dry. Clean up any water that spills on the floor as soon as it happens.  Remove  soap buildup in the tub or shower regularly.  Attach bath mats securely with double-sided non-slip rug tape.  Do not have throw rugs and other things on the floor that can make you trip. What can I do in the bedroom?  Use night lights.  Make sure that you have a light by your bed that is easy to reach.  Do not use any sheets or blankets that are too big for your bed. They should not hang down onto the floor.  Have a firm chair that has side arms. You can use this for support while you get dressed.  Do not have throw rugs and other things on the floor that can make you trip. What can I do in the kitchen?  Clean up any spills right away.  Avoid walking on wet floors.  Keep items that you use a lot in easy-to-reach places.  If you need to reach something above you, use a strong step stool that has a grab bar.  Keep electrical cords out of the way.  Do not use floor polish or wax that makes floors slippery. If you must use wax, use non-skid floor wax.  Do not have throw rugs and other things on the floor that can make you trip. What can I do with my stairs?  Do not leave any items on the stairs.  Make sure that there are handrails on both sides of the stairs and use them. Fix handrails that are broken or loose. Make sure that handrails are as long as the stairways.  Check any carpeting to make sure that it is firmly attached to the stairs. Fix any carpet that is loose or worn.  Avoid having throw rugs at the top or bottom of the stairs. If you do have throw rugs, attach them to the floor with carpet tape.  Make sure that you have a light switch at the top of the stairs and the bottom of the stairs. If you do not have them, ask someone to add them for you. What else can I do to help prevent falls?  Wear shoes that:  Do not have high heels.  Have rubber bottoms.  Are comfortable and fit you well.  Are closed at the toe. Do not wear sandals.  If you use a  stepladder:  Make sure that it is fully opened. Do not climb a closed stepladder.  Make sure that both sides of the stepladder are locked into place.  Ask someone to hold it for you, if possible.  Clearly mark and make sure that you can see:  Any grab bars or handrails.  First and last steps.  Where the edge of each step is.  Use tools that help you move around (mobility aids) if they are needed. These include:  Canes.  Walkers.  Scooters.  Crutches.  Turn on the lights when you go into a dark area. Replace any light bulbs as soon as they burn out.  Set up your furniture so you have a clear path. Avoid moving your furniture around.  If any of your floors are uneven, fix them.  If there are any pets around you, be aware of where they are.  Review your medicines with your doctor. Some medicines can make you feel dizzy. This can increase your chance of falling. Ask your doctor what other things that you can do to help prevent falls. This information is not intended to replace advice given to you by your health care provider. Make sure you discuss any questions you have with your health care provider. Document Released: 09/10/2009 Document Revised: 04/21/2016 Document Reviewed: 12/19/2014 Elsevier Interactive Patient Education  2017 [employer].

## 2018-05-04 NOTE — Progress Notes (Signed)
BP 130/82   Pulse 74   Temp 98.3 F (36.8 C) (Oral)   Ht 5' 4.5" (1.638 m)   Wt 194 lb 11.2 oz (88.3 kg)   LMP  (LMP Unknown)   SpO2 98%   BMI 32.90 kg/m    Subjective:    Patient ID: Kim Thomas, female    DOB: 1956-02-15, 62 y.o.   MRN: [id number]  HPI: Kim Thomas is a 62 y.o. female  Chief Complaint  Patient presents with  . Annual Exam    pt had wellness visit with [facility] before visit   Diabetes: Checked last month Using medications without difficulties No hypoglycemic episodes No hyperglycemic episodes Feet problems: Pt would like to buy diabetic shoes as she is getting calluses.  History of neuropathy Blood Sugars averaging: eye exam within last year: through Kim Thomas Last Hgb A1C: 7.3% Working on diet.    Hypertension  Using medications without difficulty Average home BPs Not checking  Using medication without problems or lightheadedness No chest pain with exertion or shortness of breath No Edema  Elevated Cholesterol Using medications without problems No Muscle aches  Diet: Exercise:  Recently had cataract surgery  Relevant past medical, surgical, family and social history reviewed and updated as indicated. Interim medical history since our last visit reviewed. Allergies and medications reviewed and updated.  Review of Systems  All other systems reviewed and are negative.   Per HPI unless specifically indicated above     Objective:    BP 130/82   Pulse 74   Temp 98.3 F (36.8 C) (Oral)   Ht 5' 4.5" (1.638 m)   Wt 194 lb 11.2 oz (88.3 kg)   LMP  (LMP Unknown)   SpO2 98%   BMI 32.90 kg/m   Wt Readings from Last 3 Encounters:  05/04/18 194 lb 11.2 oz (88.3 kg)  05/04/18 194 lb 11.2 oz (88.3 kg)  04/03/18 195 lb (88.5 kg)    Physical Exam  Constitutional: She is oriented to person, place, and time. She appears well-developed and well-nourished.  HENT:  Head: Normocephalic and atraumatic.  Eyes: Pupils are equal, round, and reactive  to light. Right eye exhibits no discharge. Left eye exhibits no discharge. No scleral icterus.  Neck: Normal range of motion. Neck supple. Carotid bruit is not present. No thyromegaly present.  Cardiovascular: Normal rate, regular rhythm and normal heart sounds. Exam reveals no gallop and no friction rub.  No murmur heard. Pulmonary/Chest: Effort normal and breath sounds normal. No respiratory distress. She has no wheezes. She has no rales. No breast tenderness or discharge.  Abdominal: Soft. Bowel sounds are normal. There is no tenderness. There is no rebound.  Genitourinary: No breast tenderness or discharge.  Musculoskeletal: Normal range of motion.  Lymphadenopathy:    She has no cervical adenopathy.  Neurological: She is alert and oriented to person, place, and time.  Skin: Skin is warm, dry and intact. No rash noted.  Psychiatric: She has a normal mood and affect. Her speech is normal and behavior is normal. Judgment and thought content normal. Cognition and memory are normal.   Diabetic Foot Exam - Simple   Simple Foot Form Diabetic Foot exam was performed with the following findings:  Yes 05/04/2018  9:34 AM  Visual Inspection See comments:  Yes Sensation Testing Intact to touch and monofilament testing bilaterally:  Yes Pulse Check Posterior Tibialis and Dorsalis pulse intact bilaterally:  Yes Comments Calluses lateral aspect bilateral feet  Results for orders placed or performed in visit on 04/03/18  Comprehensive metabolic panel  Result Value Ref Range   Glucose 112 (H) 65 - 99 mg/dL   BUN 18 8 - 27 mg/dL   Creatinine, Ser 1.61 (H) 0.57 - 1.00 mg/dL   GFR calc non Af Amer 51 (L) >59 mL/min/1.73   GFR calc Af Amer 58 (L) >59 mL/min/1.73   BUN/Creatinine Ratio 16 12 - 28   Sodium 143 134 - 144 mmol/L   Potassium 4.0 3.5 - 5.2 mmol/L   Chloride 109 (H) 96 - 106 mmol/L   CO2 19 (L) 20 - 29 mmol/L   Calcium 9.0 8.7 - 10.3 mg/dL   Total Protein 6.7 6.0 - 8.5 g/dL    Albumin 3.7 3.6 - 4.8 g/dL   Globulin, Total 3.0 1.5 - 4.5 g/dL   Albumin/Globulin Ratio 1.2 1.2 - 2.2   Bilirubin Total <0.2 0.0 - 1.2 mg/dL   Alkaline Phosphatase 62 39 - 117 IU/L   AST 23 0 - 40 IU/L   ALT 20 0 - 32 IU/L  Bayer DCA Hb A1c Waived  Result Value Ref Range   HB A1C (BAYER DCA - WAIVED) 7.3 (H) <7.0 %      Assessment & Plan:   Problem List Items Addressed This Visit      Unprioritized   Diabetes (HCC)    Not to goal of Hgb A1C of 7.3%  Due to be checked in 2 months      Hyperlipidemia    Stable, continue present medications.        Hypertension    Stable, continue present medications.        Sleep apnea    Using CPAP.  But feels this is better       Other Visit Diagnoses    Annual physical exam    -  Primary   Breast cancer screening       Relevant Orders   MM DIGITAL SCREENING BILATERAL      HM: Check HIV next visit when we are doing labs Order mammogram  Follow up plan: Return in about 2 months (around 07/04/2018).

## 2018-05-04 NOTE — Assessment & Plan Note (Addendum)
Not to goal of Hgb A1C of 7.3%  Due to be checked in 2 months

## 2018-05-04 NOTE — Assessment & Plan Note (Signed)
Stable, continue present medications.   

## 2018-05-04 NOTE — Patient Instructions (Signed)
Please do call to schedule your mammogram; the number to schedule one at either [facility] or [facility] is [phone]   

## 2018-05-04 NOTE — Telephone Encounter (Signed)
Received referral for low dose lung cancer screening CT scan. Message left at phone number listed in EMR for patient to call me back to facilitate scheduling scan.  

## 2018-05-04 NOTE — Progress Notes (Signed)
Subjective:   Kim Thomas is a 62 y.o. female who presents for Medicare Annual (Subsequent) preventive examination.  Review of Systems:  Cardiac Risk Factors include: dyslipidemia;advanced age (>36mn, >>33women);hypertension;obesity (BMI >30kg/m2);smoking/ tobacco exposure     Objective:     Vitals: BP 130/82 (BP Location: Left Arm, Patient Position: Sitting)   Pulse 74   Temp 98.3 F (36.8 C) (Temporal)   Resp 16   Ht 5' 4.5" (1.638 m)   Wt 194 lb 11.2 oz (88.3 kg)   LMP  (LMP Unknown)   SpO2 98%   BMI 32.90 kg/m   Body mass index is 32.9 kg/m.  Advanced Directives 09/13/2017 05/24/2017 02/20/2017 07/18/2016 05/20/2016 09/16/2015 09/16/2015  Does Patient Have a Medical Advance Directive? Yes No No No No No No  Type of AParamedicof APamplicoLiving will - - - - - -  Would patient like information on creating a medical advance directive? - - - - - No - patient declined information No - patient declined information    Tobacco Social History   Tobacco Use  Smoking Status Former Smoker  . Last attempt to quit: 05/21/2011  . Years since quitting: 6.9  Smokeless Tobacco Never Used     Counseling given: Not Answered   Clinical Intake:  Pre-visit preparation completed: Yes  Pain : No/denies pain     Nutritional Status: BMI > 30  Obese Nutritional Risks: None Diabetes: Yes CBG done?: No Did pt. bring in CBG monitor from home?: No  How often do you need to have someone help you when you read instructions, pamphlets, or other written materials from your doctor or pharmacy?: 1 - Never What is the last grade level you completed in school?: BS  Interpreter Needed?: No  Information entered by :: Tiffany Hill,LPN   Past Medical History:  Diagnosis Date  . Anxiety   . Chronic kidney disease   . DDD (degenerative disc disease), cervical   . Diabetes mellitus without complication ([place]   . Gout   . Hypertension   . Sleep apnea    Past  Surgical History:  Procedure Laterality Date  . BREAST BIOPSY Left    benign  . BREAST CYST EXCISION Left   . BREAST EXCISIONAL BIOPSY Left   . c section     x2  . CHOLECYSTECTOMY    . FOOT SURGERY Bilateral   . KNEE ARTHROSCOPY Left 07/18/2016   Procedure: ARTHROSCOPY KNEE;  Surgeon: JDereck Leep MD;  Location: [facility] OR[facility];  Service: Orthopedics;  Laterality: Left;  . KNEE ARTHROSCOPY WITH LATERAL MENISECTOMY Left 07/18/2016   Procedure: KNEE ARTHROSCOPY WITH PARTIAL  LATERAL MENISECTOMY, CHONDROPLASTY LATERAL FEMORAL CONDYLE;  Surgeon: JDereck Leep MD;  Location: [facility] OR[facility];  Service: Orthopedics;  Laterality: Left;  . SPINE SURGERY  11/09/11   C5  . TUBAL LIGATION     Family History  Problem Relation Age of Onset  . Cancer Mother        breast  . Hypertension Mother   . Hyperlipidemia Mother   . Diabetes Mother   . Glaucoma Mother   . Cataracts Mother   . Breast cancer Mother   . Cancer Father   . Cancer Maternal Grandfather   . Breast cancer Maternal Aunt   . Breast cancer Cousin   . Alcohol abuse Neg Hx    Social History   Socioeconomic History  . Marital status: Single    Spouse name: Not on file  . Number  of children: Not on file  . Years of education: Not on file  . Highest education level: Not on file  Occupational History  . Not on file  Social Needs  . Financial resource strain: Somewhat hard  . Food insecurity:    Worry: Never true    Inability: Never true  . Transportation needs:    Medical: No    Non-medical: No  Tobacco Use  . Smoking status: Former Smoker    Last attempt to quit: 05/21/2011    Years since quitting: 6.9  . Smokeless tobacco: Never Used  Substance and Sexual Activity  . Alcohol use: No    Alcohol/week: 0.0 oz  . Drug use: No  . Sexual activity: Not Currently  Lifestyle  . Physical activity:    Days per week: 0 days    Minutes per session: 0 min  . Stress: Not at all  Relationships  . Social connections:    Talks on  phone: More than three times a week    Gets together: More than three times a week    Attends religious service: More than 4 times per year    Active member of club or organization: No    Attends meetings of clubs or organizations: Never    Relationship status: Not on file  Other Topics Concern  . Not on file  Social History Narrative  . Not on file    Outpatient Encounter Medications as of 05/04/2018  Medication Sig  . acetaminophen (TYLENOL) 500 MG tablet Take 500 mg by mouth daily as needed.  [name] atorvastatin (LIPITOR) 20 MG tablet Take 1 tablet (20 mg total) by mouth daily.  . Blood Glucose Monitoring Suppl (ONE TOUCH ULTRA MINI) w/Device KIT As directed  . Cholecalciferol (VITAMIN D3) 2000 units TABS Take 2,000 Units by mouth daily.  [name] CINNAMON PO Take 2 capsules daily by mouth.  . diclofenac sodium (VOLTAREN) 1 % GEL Apply topically.  . fluticasone (FLONASE) 50 MCG/ACT nasal spray Place 1-2 sprays into both nostrils as needed.   . gabapentin (NEURONTIN) 400 MG capsule Take 1 capsule by mouth 3 (three) times daily.  . Glucosamine-Chondroit-Vit C-Mn (GLUCOSAMINE CHONDR 1500 COMPLX PO) Take by mouth.  [name] glucose blood (ONE TOUCH ULTRA TEST) test strip   . Magnesium 250 MG TABS Take 250 mg by mouth daily.  . Multiple Vitamin (MULTIVITAMIN) tablet Take 1 tablet by mouth daily.  . Omega-3 1000 MG CAPS Take by mouth.  Glory Rosebush DELICA LANCETS FINE MISC   . OVER THE COUNTER MEDICATION Take 1 tablet by mouth 2 (two) times daily. Ultimate joint repair  . pyridOXINE (VITAMIN B-6) 100 MG tablet Take 100 mg by mouth daily.  [name] telmisartan-hydrochlorothiazide (MICARDIS HCT) 40-12.5 MG tablet Take 1 tablet by mouth daily.  [name] triamcinolone cream (KENALOG) 0.1 % Apply 1 application topically 2 (two) times daily.  [name] UNABLE TO FIND Cherry Juice- pt states she takes 1 tsp daily at bedtime  . vitamin B-12 (CYANOCOBALAMIN) 500 MCG tablet Take 500 mcg by mouth daily.  . DULoxetine (CYMBALTA) 20 MG  capsule TAKE 1 CAPSULE BY MOUTH  DAILY (Patient not taking: Reported on 05/04/2018)  . ketorolac (ACULAR) 0.5 % ophthalmic solution   . prednisoLONE acetate (PRED FORTE) 1 % ophthalmic suspension    No facility-administered encounter medications on file as of 05/04/2018.     Activities of Daily Living In your present state of health, do you have any difficulty performing the following activities: 05/04/2018 06/02/2017  Hearing?  N N  Vision? Y N  Comment cataracts -  Difficulty concentrating or making decisions? N N  Walking or climbing stairs? N N  Dressing or bathing? N N  Doing errands, shopping? N N  Preparing Food and eating ? N N  Using the Toilet? N N  In the past six months, have you accidently leaked urine? N N  Do you have problems with loss of bowel control? N N  Managing your Medications? N N  Managing your Finances? N N  Housekeeping or managing your Housekeeping? N N  Some recent data might be hidden    Patient Care Team: Kathrine Haddock, NP as PCP - General (Nurse Practitioner) Anabel Bene, MD as Referring Physician (Neurology) Watt Climes, PA as Physician Assistant (Orthopedic Surgery) Dereck Leep, MD (Orthopedic Surgery)    Assessment:   This is a routine wellness examination for [employer].  Exercise Activities and Dietary recommendations Current Exercise Habits: Home exercise routine, Type of exercise: walking(bicycle), Time (Minutes): 30, Frequency (Times/Week): 2, Weekly Exercise (Minutes/Week): 60, Intensity: Mild, Exercise limited by: None identified  Goals    . DIET - INCREASE WATER INTAKE     Recommend drinking at least 6-8 glasses of water a day        Fall Risk Fall Risk  05/04/2018 10/02/2017 06/30/2017 06/02/2017 05/17/2017  Falls in the past year? Yes Yes Yes Yes No  Number falls in past yr: 2 or more 2 or more 2 or more 2 or more -  Comment - 5 - - -  Injury with Fall? Yes Yes No No -  Risk Factor Category  High Fall Risk - - High Fall Risk -    Follow up Falls prevention discussed - - Falls prevention discussed -   Is the patient's home free of loose throw rugs in walkways, pet beds, electrical cords, etc?   no      Grab bars in the bathroom? yes      Handrails on the stairs?   yes      Adequate lighting?   yes  Timed Get Up and Go performed: Completed in 9 seconds with no use of assistive devices, steady gait. No intervention needed at this time.   Depression Screen PHQ 2/9 Scores 05/04/2018 04/03/2018 10/02/2017 06/30/2017  PHQ - 2 Score 0 0 0 3  PHQ- 9 Score - '1 5 3     ' Cognitive Function     6CIT Screen 05/04/2018 06/02/2017  What Year? 0 points 0 points  What month? 0 points 0 points  What time? 0 points 0 points  Count back from 20 0 points 0 points  Months in reverse 0 points 0 points  Repeat phrase 0 points 0 points  Total Score 0 0    Immunization History  Administered Date(s) Administered  . Td 04/22/2015  . Zoster Recombinat (Shingrix) 02/23/2017, 06/16/2017    Qualifies for Shingles Vaccine?completed   Screening Tests Health Maintenance  Topic Date Due  . HIV Screening  01/14/1971  . OPHTHALMOLOGY EXAM  07/23/2016  . FOOT EXAM  03/01/2018  . PNEUMOCOCCAL POLYSACCHARIDE VACCINE (1) 10/02/2018 (Originally 01[birth year]14/1958)  . INFLUENZA VACCINE  06/28/2018  . HEMOGLOBIN A1C  10/04/2018  . MAMMOGRAM  03/09/2019  . PAP SMEAR  02/21/2020  . COLONOSCOPY  11/29/2023  . TETANUS/TDAP  04/21/2025  . Hepatitis C Screening  Completed    Cancer Screenings: Lung: Low Dose CT Chest recommended if Age 29-80 years, 30 pack-year currently smoking OR have  quit w/in 15years. Patient does qualify. Will send message to Burgess Estelle, RN- Oncology Navigator to see if she qualifies Breast:  Up to date on Mammogram? Yes  03/08/2017 Up to date of Bone Density/Dexa? NOT INDICATED AT THIS TIME Colorectal: completed 11/28/2013  Additional Screenings:  Hepatitis C Screening: completed 06/30/2017     Plan:    I have personally  reviewed and addressed the Medicare Annual Wellness questionnaire and have noted the following in the patient's chart:  A. Medical and social history B. Use of alcohol, tobacco or illicit drugs  C. Current medications and supplements D. Functional ability and status E.  Nutritional status F.  Physical activity G. Advance directives H. List of other physicians I.  Hospitalizations, surgeries, and ER visits in previous 12 months J.  [place] such as hearing and vision if needed, cognitive and depression L. Referrals and appointments   In addition, I have reviewed and discussed with patient certain preventive protocols, quality metrics, and best practice recommendations. A written personalized care plan for preventive services as well as general preventive health recommendations were provided to patient.   Signed,  Tyler Aas, LPN Nurse Health Advisor   Nurse Notes:   Due for diabetic foot exam.   will call Dr.Woodard for eye exam results.

## 2018-05-04 NOTE — Assessment & Plan Note (Signed)
Using CPAP.  But feels this is better

## 2018-05-07 ENCOUNTER — Telehealth: Payer: Self-pay | Admitting: *Deleted

## 2018-05-07 NOTE — Telephone Encounter (Signed)
Received referral for low dose lung cancer screening CT scan. Message left at phone number listed in EMR for patient to call me back to facilitate scheduling scan.  

## 2018-05-10 ENCOUNTER — Other Ambulatory Visit: Payer: Self-pay | Admitting: Family Medicine

## 2018-05-10 ENCOUNTER — Telehealth: Payer: Self-pay | Admitting: Unknown Physician Specialty

## 2018-05-10 MED ORDER — TELMISARTAN 40 MG PO TABS: 40.0000 mg | ORAL_TABLET | Freq: Every day | ORAL | 1 refills | Status: DC

## 2018-05-10 NOTE — Telephone Encounter (Signed)
[facility]

## 2018-05-10 NOTE — Telephone Encounter (Signed)
Copied from CRM [id number]. Topic: Quick Communication - See Telephone Encounter >> May 10, 2018  1:51 PM Kim Thomas, Kim, NT wrote: CRM for notification. See Telephone encounter for: 05/10/18. Patient states Kim MaxwellCheryl prescribed her a BP medication that includes a diuretic . Patient states she would like a new RX sent in for BP w/ o the diuretic. Patient states she is up all night going to the bathroom and is not getting any sleep.  Patient states if you have any questions please call CB #  [phone]  [facility] [address] - [place]), [place] - [address] [phone] (Phone) [phone] (Fax)

## 2018-05-10 NOTE — Telephone Encounter (Signed)
Patient notified

## 2018-05-16 ENCOUNTER — Other Ambulatory Visit: Payer: Self-pay | Admitting: Unknown Physician Specialty

## 2018-06-07 ENCOUNTER — Telehealth: Payer: Self-pay | Admitting: *Deleted

## 2018-06-07 NOTE — Telephone Encounter (Signed)
Received referral for low dose lung cancer screening CT scan. Message left at phone number listed in EMR for patient to call me back to facilitate scheduling scan.  

## 2018-07-02 ENCOUNTER — Encounter: Payer: Self-pay | Admitting: *Deleted

## 2018-07-06 ENCOUNTER — Other Ambulatory Visit: Payer: Self-pay | Admitting: Unknown Physician Specialty

## 2018-07-10 ENCOUNTER — Other Ambulatory Visit: Payer: Self-pay

## 2018-07-10 ENCOUNTER — Encounter: Payer: Self-pay | Admitting: Physician Assistant

## 2018-07-10 ENCOUNTER — Other Ambulatory Visit: Payer: Self-pay | Admitting: Family Medicine

## 2018-07-10 ENCOUNTER — Ambulatory Visit (INDEPENDENT_AMBULATORY_CARE_PROVIDER_SITE_OTHER): Payer: Medicare Other | Admitting: Physician Assistant

## 2018-07-10 VITALS — BP 116/74 | HR 101 | Temp 98.2°F | Ht 64.5 in | Wt 193.1 lb

## 2018-07-10 DIAGNOSIS — E785 Hyperlipidemia, unspecified: Secondary | ICD-10-CM

## 2018-07-10 DIAGNOSIS — I10 Essential (primary) hypertension: Secondary | ICD-10-CM | POA: Diagnosis not present

## 2018-07-10 DIAGNOSIS — E119 Type 2 diabetes mellitus without complications: Secondary | ICD-10-CM

## 2018-07-10 DIAGNOSIS — M25562 Pain in left knee: Secondary | ICD-10-CM

## 2018-07-10 DIAGNOSIS — G4733 Obstructive sleep apnea (adult) (pediatric): Secondary | ICD-10-CM

## 2018-07-10 LAB — BAYER DCA HB A1C WAIVED: HB A1C (BAYER DCA - WAIVED): 7.1 % — ABNORMAL HIGH (ref <7.0)

## 2018-07-10 MED ORDER — TELMISARTAN 40 MG PO TABS: 40.0000 mg | ORAL_TABLET | Freq: Every day | ORAL | 0 refills | Status: DC

## 2018-07-10 NOTE — Progress Notes (Signed)
Subjective:    Patient ID: Kim Thomas, female    DOB: 06-21-1956, 62 y.o.   MRN: [id number]  Kim Aquasngel Colin is a 62 y.o. female presenting on 07/10/2018 for Hypertension; Hyperlipidemia; Diabetes; and Medication Refill (atorvastatin)   HPI   HTN: Well controlled with Telmisartan  HLD: Lipitor 20 mg started last visit.   DM: Not currently taking any medications, diet controlled. Kidney function decreased prior on metformin and she does not wish to be on this today. A1c is 7.1% which is down from 7.3%.   Reports needing to lose weight - wanting suggestions for supplement. Eat meat, veggies, and beans. 100% margarine and no butter. Usually water, sometime ginger ale dilute with water. Coffee 2 cups per tea and tea occasionally at night.   Sleep apnea: Compliant with CPAP.   Trigger point injections: from Dot LanesAshley Stepp in neurology Muscle Spasm: sees Dr. Malvin JohnsPotter Arthritis and meniscal injury: sees Dr. Geoffery SpruceHooten   Wt Readings from Last 3 Encounters:  07/10/18 193 lb 1.6 oz (87.6 kg)  05/04/18 194 lb 11.2 oz (88.3 kg)  05/04/18 194 lb 11.2 oz (88.3 kg)     Social History   Tobacco Use  . Smoking status: Former Smoker    Last attempt to quit: 05/21/2011    Years since quitting: 7.1  . Smokeless tobacco: Never Used  Substance Use Topics  . Alcohol use: No    Alcohol/week: 0.0 standard drinks  . Drug use: No    Review of Systems Per HPI unless specifically indicated above     Objective:    BP 116/74   Pulse (!) 101   Temp 98.2 F (36.8 C) (Oral)   Ht 5' 4.5" (1.638 m)   Wt 193 lb 1.6 oz (87.6 kg)   LMP  (LMP Unknown)   SpO2 94%   BMI 32.63 kg/m   Wt Readings from Last 3 Encounters:  07/10/18 193 lb 1.6 oz (87.6 kg)  05/04/18 194 lb 11.2 oz (88.3 kg)  05/04/18 194 lb 11.2 oz (88.3 kg)    Physical Exam  Constitutional: She is oriented to person, place, and time. She appears well-developed and well-nourished.  Cardiovascular: Normal rate and regular rhythm.    Pulmonary/Chest: Effort normal and breath sounds normal.  Neurological: She is alert and oriented to person, place, and time.  Skin: Skin is warm and dry.  Psychiatric: She has a normal mood and affect. Her behavior is normal.   Results for orders placed or performed in visit on 04/03/18  Comprehensive metabolic panel  Result Value Ref Range   Glucose 112 (H) 65 - 99 mg/dL   BUN 18 8 - 27 mg/dL   Creatinine, Ser 7.821.16 (H) 0.57 - 1.00 mg/dL   GFR calc non Af Amer 51 (L) >59 mL/min/1.73   GFR calc Af Amer 58 (L) >59 mL/min/1.73   BUN/Creatinine Ratio 16 12 - 28   Sodium 143 134 - 144 mmol/L   Potassium 4.0 3.5 - 5.2 mmol/L   Chloride 109 (H) 96 - 106 mmol/L   CO2 19 (L) 20 - 29 mmol/L   Calcium 9.0 8.7 - 10.3 mg/dL   Total Protein 6.7 6.0 - 8.5 g/dL   Albumin 3.7 3.6 - 4.8 g/dL   Globulin, Total 3.0 1.5 - 4.5 g/dL   Albumin/Globulin Ratio 1.2 1.2 - 2.2   Bilirubin Total <0.2 0.0 - 1.2 mg/dL   Alkaline Phosphatase 62 39 - 117 IU/L   AST 23 0 - 40 IU/L   ALT  20 0 - 32 IU/L  Bayer DCA Hb A1c Waived  Result Value Ref Range   HB A1C (BAYER DCA - WAIVED) 7.3 (H) <7.0 %      Assessment & Plan:  1. Essential hypertension  Controlled, continue 40 mg telmisartan.  2. Type 2 diabetes mellitus without complication, without long-term current use of insulin (HCC)  7.1% today, decreased. Controlled.   - Bayer DCA Hb A1c Waived (STAT)  3. Hyperlipidemia, unspecified hyperlipidemia type  Checking lipid panel, currently taking lipitor 20 mg.   - Lipid Profile  4. Obstructive sleep apnea syndrome  Compliant with CPAP.  5. Left knee pain, unspecified chronicity  Follows with ortho. Considering flexogenics.     Follow up plan: Return in about 3 months (around 10/10/2018) for HTN, HLD, DM .  Osvaldo AngstAdriana Anette Barra, PA-C [facility]  [facility] 07/10/2018, 2:33 PM

## 2018-07-11 LAB — LIPID PANEL
Chol/HDL Ratio: 2.5 ratio (ref 0.0–4.4)
Cholesterol, Total: 148 mg/dL (ref 100–199)
HDL: 59 mg/dL (ref >39)
LDL Calculated: 54 mg/dL (ref 0–99)
Triglycerides: 175 mg/dL — ABNORMAL HIGH (ref 0–149)
VLDL Cholesterol Cal: 35 mg/dL (ref 5–40)

## 2018-08-02 ENCOUNTER — Ambulatory Visit: Payer: Medicare Other | Admitting: Unknown Physician Specialty

## 2018-08-03 ENCOUNTER — Ambulatory Visit: Payer: Medicare Other | Admitting: Unknown Physician Specialty

## 2018-08-14 ENCOUNTER — Telehealth: Payer: Self-pay | Admitting: Family Medicine

## 2018-08-14 DIAGNOSIS — Z111 Encounter for screening for respiratory tuberculosis: Secondary | ICD-10-CM

## 2018-08-14 NOTE — Telephone Encounter (Signed)
Copied from CRM [id number]. Topic: Inquiry >> Aug 14, 2018  3:26 PM Maia Pettiesrtiz, Kristie S wrote: Reason for CRM: pt is needing TB test to apply for a job. She was advised office does Quantiferon Gold lab test and would like to know if it can be done for her? Please advise.

## 2018-08-14 NOTE — Telephone Encounter (Signed)
Patient states that she just needs the test.

## 2018-08-14 NOTE — Telephone Encounter (Signed)
Order in. She can come in whenever she'd like

## 2018-08-14 NOTE — Telephone Encounter (Signed)
As long as it is just a TB test and not paperwork, she can come in and have it drawn. If it's paperwork, she needs an appointment.

## 2018-08-15 ENCOUNTER — Other Ambulatory Visit: Payer: Medicare Other

## 2018-08-15 DIAGNOSIS — Z111 Encounter for screening for respiratory tuberculosis: Secondary | ICD-10-CM

## 2018-08-19 LAB — QUANTIFERON-TB GOLD PLUS
QuantiFERON Mitogen Value: >10 IU/mL
QuantiFERON Nil Value: 0.03 IU/mL
QuantiFERON TB1 Ag Value: 0.03 IU/mL
QuantiFERON TB2 Ag Value: 0.03 IU/mL
QuantiFERON-TB Gold Plus: NEGATIVE

## 2018-08-20 ENCOUNTER — Encounter: Payer: Self-pay | Admitting: Family Medicine

## 2018-09-25 ENCOUNTER — Other Ambulatory Visit: Payer: Self-pay | Admitting: Nurse Practitioner

## 2018-09-25 ENCOUNTER — Telehealth: Payer: Self-pay | Admitting: Unknown Physician Specialty

## 2018-09-25 MED ORDER — ONETOUCH DELICA LANCETS FINE MISC: 2 refills | Status: DC

## 2018-09-25 NOTE — Progress Notes (Signed)
Patient request for refills on lancets.  Script sent.

## 2018-09-25 NOTE — Telephone Encounter (Signed)
Script sent to [facility]

## 2018-09-25 NOTE — Telephone Encounter (Signed)
Copied from CRM [id number]. Topic: Quick Communication - Rx Refill/Question >> Sep 25, 2018  1:49 PM Gerrianne Scale wrote: Medication: Dola Argyle LANCETS FINE MISC  Has the patient contacted their pharmacy? Yes.  Pharmacy called pt to call office because they not  getting any response from provider (Agent: If no, request that the patient contact the pharmacy for the refill.) (Agent: If yes, when and what did the pharmacy advise?)  Preferred Pharmacy (with phone number or street name):   [facility] - [place], [place] - [address] [phone] (Phone) [phone] (Fax)    Agent: Please be advised that RX refills may take up to 3 business days. We ask that you follow-up with your pharmacy.

## 2018-10-01 ENCOUNTER — Ambulatory Visit (INDEPENDENT_AMBULATORY_CARE_PROVIDER_SITE_OTHER): Payer: Medicare Other | Admitting: Family Medicine

## 2018-10-01 ENCOUNTER — Encounter: Payer: Self-pay | Admitting: Family Medicine

## 2018-10-01 VITALS — BP 133/79 | HR 82 | Temp 98.5°F | Ht 64.5 in | Wt 203.5 lb

## 2018-10-01 DIAGNOSIS — J309 Allergic rhinitis, unspecified: Secondary | ICD-10-CM

## 2018-10-01 DIAGNOSIS — J0100 Acute maxillary sinusitis, unspecified: Secondary | ICD-10-CM

## 2018-10-01 MED ORDER — CETIRIZINE HCL 10 MG PO TABS: 10.0000 mg | ORAL_TABLET | Freq: Every day | ORAL | 3 refills | Status: DC

## 2018-10-01 MED ORDER — FLUTICASONE PROPIONATE 50 MCG/ACT NA SUSP: 1.0000 | Freq: Two times a day (BID) | NASAL | 6 refills | Status: DC

## 2018-10-01 MED ORDER — AMOXICILLIN 875 MG PO TABS: 875.0000 mg | ORAL_TABLET | Freq: Two times a day (BID) | ORAL | 0 refills | Status: DC

## 2018-10-01 NOTE — Progress Notes (Signed)
BP 133/79 (BP Location: Left Arm, Patient Position: Sitting, Cuff Size: Large)   Pulse 82   Temp 98.5 F (36.9 C)   Ht 5' 4.5" (1.638 m)   Wt 203 lb 8 oz (92.3 kg) Comment: Fully dressed with shoes and coat  LMP  (LMP Unknown)   SpO2 99%   BMI 34.39 kg/m    Subjective:    Patient ID: Kim Thomas, female    DOB: 1956-03-21, 62 y.o.   MRN: [id number]  HPI: Kim Thomas is a 62 y.o. female  Chief Complaint  Patient presents with  . Headache    since Thursday  . Ear Pain    since Thursday  . Medication Refill    Flonase   Having 5-7 days of malaise, headaches, ear pain and pressure, congestion, left facial pain and pressure. Has been trying some cold and sinus medicines but had side effects and stopped them. Denies fevers, chills, sweats, cough, CP, SOB. Has a hx of allergies and states she gets these illnesses at least once a year. No sick contacts.   Relevant past medical, surgical, family and social history reviewed and updated as indicated. Interim medical history since our last visit reviewed. Allergies and medications reviewed and updated.  Review of Systems  Per HPI unless specifically indicated above     Objective:    BP 133/79 (BP Location: Left Arm, Patient Position: Sitting, Cuff Size: Large)   Pulse 82   Temp 98.5 F (36.9 C)   Ht 5' 4.5" (1.638 m)   Wt 203 lb 8 oz (92.3 kg) Comment: Fully dressed with shoes and coat  LMP  (LMP Unknown)   SpO2 99%   BMI 34.39 kg/m   Wt Readings from Last 3 Encounters:  10/01/18 203 lb 8 oz (92.3 kg)  07/10/18 193 lb 1.6 oz (87.6 kg)  05/04/18 194 lb 11.2 oz (88.3 kg)    Physical Exam  Constitutional: She is oriented to person, place, and time. She appears well-developed and well-nourished. No distress.  HENT:  Head: Atraumatic.  Right Ear: External ear normal.  Left Ear: External ear normal.  Nasal mucosa and oropharynx erythematous and edematous with drainage present B/l maxillary sinuses ttp  Eyes:  Conjunctivae and EOM are normal.  Neck: Normal range of motion. Neck supple.  Cardiovascular: Normal rate, regular rhythm and normal heart sounds.  Pulmonary/Chest: Effort normal and breath sounds normal.  Musculoskeletal: Normal range of motion.  Neurological: She is alert and oriented to person, place, and time.  Skin: Skin is warm and dry.  Psychiatric: She has a normal mood and affect. Her behavior is normal. Thought content normal.  Nursing note and vitals reviewed.   Results for orders placed or performed in visit on 08/15/18  QuantiFERON-TB Gold Plus  Result Value Ref Range   QuantiFERON Incubation Incubation performed.    QuantiFERON Criteria Comment    QuantiFERON TB1 Ag Value 0.03 IU/mL   QuantiFERON TB2 Ag Value 0.03 IU/mL   QuantiFERON Nil Value 0.03 IU/mL   QuantiFERON Mitogen Value >10.00 IU/mL   QuantiFERON-TB Gold Plus Negative Negative      Assessment & Plan:   Problem List Items Addressed This Visit      Respiratory   Allergic rhinitis    Recommended consistent zyrtec and flonase regimen       Other Visit Diagnoses    Acute maxillary sinusitis, recurrence not specified    -  Primary   Will start amoxil, plain mucinex, sinus rinses, good allergy  regimen with zyrtec and flonase. Supportive care reviewed. F/u if not improving   Relevant Medications   fluticasone (FLONASE) 50 MCG/ACT nasal spray   amoxicillin (AMOXIL) 875 MG tablet   cetirizine (ZYRTEC) 10 MG tablet       Follow up plan: Return if symptoms worsen or fail to improve.

## 2018-10-04 NOTE — Patient Instructions (Signed)
Follow up as needed

## 2018-10-04 NOTE — Assessment & Plan Note (Signed)
Recommended consistent zyrtec and flonase regimen

## 2018-10-10 ENCOUNTER — Ambulatory Visit (INDEPENDENT_AMBULATORY_CARE_PROVIDER_SITE_OTHER): Payer: Medicare Other | Admitting: Nurse Practitioner

## 2018-10-10 ENCOUNTER — Other Ambulatory Visit: Payer: Self-pay

## 2018-10-10 ENCOUNTER — Encounter: Payer: Self-pay | Admitting: Nurse Practitioner

## 2018-10-10 VITALS — BP 138/82 | HR 90 | Temp 98.1°F | Ht 64.0 in | Wt 203.0 lb

## 2018-10-10 DIAGNOSIS — E119 Type 2 diabetes mellitus without complications: Secondary | ICD-10-CM | POA: Diagnosis not present

## 2018-10-10 DIAGNOSIS — E785 Hyperlipidemia, unspecified: Secondary | ICD-10-CM

## 2018-10-10 DIAGNOSIS — N183 Chronic kidney disease, stage 3 unspecified: Secondary | ICD-10-CM

## 2018-10-10 DIAGNOSIS — I10 Essential (primary) hypertension: Secondary | ICD-10-CM | POA: Diagnosis not present

## 2018-10-10 DIAGNOSIS — J0101 Acute recurrent maxillary sinusitis: Secondary | ICD-10-CM

## 2018-10-10 LAB — MICROALBUMIN, URINE WAIVED
Creatinine, Urine Waived: 50 mg/dL (ref 10–300)
Microalb, Ur Waived: 10 mg/L (ref 0–19)
Microalb/Creat Ratio: <30 mg/g (ref <30)

## 2018-10-10 LAB — BAYER DCA HB A1C WAIVED: HB A1C (BAYER DCA - WAIVED): 7 % — ABNORMAL HIGH (ref <7.0)

## 2018-10-10 MED ORDER — DOXYCYCLINE HYCLATE 100 MG PO TABS: 100.0000 mg | ORAL_TABLET | Freq: Two times a day (BID) | ORAL | 0 refills | Status: AC

## 2018-10-10 NOTE — Assessment & Plan Note (Addendum)
Chronic, previous A1C 7.3% and today 7.0%.  Continue to focus on exercise and diet, as improvement noted at this time.  Recheck in 3 months.

## 2018-10-10 NOTE — Patient Instructions (Signed)
TAKE ANTIBIOTIC WITH YOGURT WITH PROBIOTIC.  CONTINUE EATING YOGURT WITH PROBIOTIC TWICE A DAY FOR 14 DAYS TOTAL (EVEN AFTER ANTIBIOTIC IS COMPLETE).  Doxycycline oral tablets What is this medicine? DOXYCYCLINE (dox i SYE kleen) is a tetracycline antibiotic. It kills certain bacteria or stops their growth. This medicine is used to treat a dental infection called periodontitis. This medicine may be used for other purposes; ask your health care provider or pharmacist if you have questions. COMMON BRAND NAME(S): Oraxyl, Periostat What should I tell my health care provider before I take this medicine? They need to know if you have any of these conditions: -liver disease -long exposure to sunlight like working outdoors -stomach problems like colitis -an unusual or allergic reaction to doxycycline, tetracycline antibiotics, other medicines, foods, dyes, or preservatives -pregnant or trying to get pregnant -breast-feeding How should I use this medicine? Take this medicine by mouth with a full glass of water. Follow the directions on the prescription label. Take this medicine at least 1 hour before or 2 hours after food. Take your medicine at regular intervals. Do not take your medicine more often than directed. Take all of your medicine as directed even if you think you are better. Do not skip doses or stop your medicine early. Talk to your pediatrician regarding the use of this medicine in children. Special care may be needed. Overdosage: If you think you have taken too much of this medicine contact a poison control center or emergency room at once. NOTE: This medicine is only for you. Do not share this medicine with others. What if I miss a dose? If you miss a dose, take it as soon as you can. If it is almost time for your next dose, take only that dose. Do not take double or extra doses. What may interact with this medicine? -antacids -barbiturates -birth control pills -bismuth  subsalicylate -carbamazepine -methoxyflurane -other antibiotics -phenytoin -vitamins that contain iron -warfarin This list may not describe all possible interactions. Give your health care provider a list of all the medicines, herbs, non-prescription drugs, or dietary supplements you use. Also tell them if you smoke, drink alcohol, or use illegal drugs. Some items may interact with your medicine. What should I watch for while using this medicine? Tell your doctor or health care professional if your symptoms do not improve. Do not treat diarrhea with over the counter products. Contact your doctor if you have diarrhea that lasts more than 2 days or if it is severe and watery. Do not take this medicine just before going to bed. It may not dissolve properly when you lay down and can cause pain in your throat. Drink plenty of fluids while taking this medicine to also help reduce irritation in your throat. This medicine can make you more sensitive to the sun. Keep out of the sun. If you cannot avoid being in the sun, wear protective clothing and use sunscreen. Do not use sun lamps or tanning beds/booths. Birth control pills may not work properly while you are taking this medicine. Talk to your doctor about using an extra method of birth control. If you are being treated for a sexually transmitted infection, avoid sexual contact until you have finished your treatment. Your sexual partner may also need treatment. Avoid antacids, aluminum, calcium, magnesium, and iron products for 4 hours before and 2 hours after taking a dose of this medicine. What side effects may I notice from receiving this medicine? Side effects that you should report to your  doctor or health care professional as soon as possible: -allergic reactions like skin rash, itching or hives, swelling of the face, lips, or tongue -difficulty breathing -fever -itching in the rectal or genital area -pain on swallowing -redness, blistering,  peeling or loosening of the skin, including inside the mouth -severe stomach pain or cramps -unusual bleeding or bruising -unusually weak or tired -yellowing of the eyes or skin Side effects that usually do not require medical attention (report to your doctor or health care professional if they continue or are bothersome): -diarrhea -loss of appetite -nausea, vomiting This list may not describe all possible side effects. Call your doctor for medical advice about side effects. You may report side effects to FDA at 1-800-FDA-1088. Where should I keep my medicine? Keep out of the reach of children. Store at room temperature between 15 and 30 degrees C (59 and 86 degrees F). Protect from light. Keep container tightly closed. Throw away any unused medicine after the expiration date. Taking this medicine after the expiration date can make you seriously ill. NOTE: This sheet is a summary. It may not cover all possible information. If you have questions about this medicine, talk to your doctor, pharmacist, or health care provider.  2018 Elsevier/Gold Standard (2008-03-13 14:50:34)

## 2018-10-10 NOTE — Progress Notes (Addendum)
BP 138/82 (BP Location: Left Arm, Patient Position: Sitting)   Pulse 90   Temp 98.1 F (36.7 C) (Oral)   Ht 5\' 4"  (1.626 m)   Wt 203 lb (92.1 kg)   LMP  (LMP Unknown)   SpO2 97%   BMI 34.84 kg/m    Subjective:    Patient ID: Kim Thomas, female    DOB: 10-01-1956, 62 y.o.   MRN: [id number]  HPI: Kim Aquasngel Jurich is a 62 y.o. female presents for HTN/HLD, T2DM and sinus infection follow-up  Chief Complaint  Patient presents with  . Diabetes    f/u  . Hypertension  . Hyperlipidemia   HYPERTENSION / HYPERLIPIDEMIA Continues to take Telmisartan, was previously on this with HCTZ; but HCTZ caused muscle cramps and this was discontinued.   Satisfied with current treatment? yes Duration of hypertension: chronic BP monitoring frequency: not checking BP range:  BP medication side effects: no Past BP meds: Telmisartan Duration of hyperlipidemia: chronic Cholesterol medication side effects: no Cholesterol supplements: none Past cholesterol medications: none Medication compliance: excellent compliance Aspirin: no Recent stressors: no Recurrent headaches: no Visual changes: yes, a little bit lately Palpitations: no Dyspnea: no Chest pain: no Lower extremity edema: no Dizzy/lightheaded: no   DIABETES WITH NEPHROPATHY She reports feeling she may need a little help, but can not take Metformin due to kidneys.  States she thinks her "numbers will be off" because her vision has been off sometimes and she can not take apple cider vinegar as much because of kidneys.  Saw nephrologist here and has not seen since "stabilized", states they took her off Metformin.  Has concerns about taking medication because she does not want to hurt kidneys.  Discussed alternative options, such as Ozempic, if A1C increased from previous today.  Has taken Trulicity in past, but states it made her "numbers all off".  She has been focusing on diet at home.   Hypoglycemic episodes:no Polydipsia/polyuria:  no Visual disturbance: no Chest pain: no Paresthesias: no Glucose Monitoring: yes  Accucheck frequency: Daily  Fasting glucose: 140's  Post prandial:  Evening: 160's  Before meals: Taking Insulin?: no  Long acting insulin:  Short acting insulin: Blood Pressure Monitoring: not checking Retinal Examination: Up to Date Foot Exam: Up to Date Diabetic Education: Completed Pneumovax: Up to Date Influenza: Up to Date Aspirin: no  SINUS INFECTION: Seen on 10/01/18 for sinus infection and treated with Amoxicillin. Reports left side of face continues to bother her, states that often antibiotic clears this issue up within 3 days but this time it is not improving as quickly.  Discussed various abx used in past for sinus infection, she reports Doxycycline has been used successfully in past.  States it is better than when came last week, but left side still feels "inflamed" and hurts.  Has been taking antibiotic, has two days left, and Flonase + Claritin daily.  States sinus issues started upon moving to the state from [place] 2 years ago and has frequent infection during fall season.    Relevant past medical, surgical, family and social history reviewed and updated as indicated. Interim medical history since our last visit reviewed. Allergies and medications reviewed and updated.  Review of Systems  Constitutional: Negative for activity change, appetite change, chills, diaphoresis, fatigue and fever.  HENT: Positive for postnasal drip, rhinorrhea, sinus pressure and sinus pain. Negative for congestion, ear discharge, ear pain, sore throat, tinnitus, trouble swallowing and voice change.   Eyes: Negative for pain, redness, itching and  visual disturbance.  Respiratory: Negative for cough, chest tightness, shortness of breath and wheezing.   Cardiovascular: Negative for chest pain, palpitations and leg swelling.  Gastrointestinal: Negative for abdominal distention, abdominal pain, constipation,  diarrhea, nausea and vomiting.  Endocrine: Negative for cold intolerance, heat intolerance, polydipsia, polyphagia and polyuria.  Genitourinary: Negative.   Musculoskeletal: Negative for arthralgias, joint swelling and myalgias.  Skin: Negative.   Neurological: Negative for dizziness, syncope, weakness, light-headedness, numbness and headaches.  Psychiatric/Behavioral: Negative.     Per HPI unless specifically indicated above     Objective:    BP 138/82 (BP Location: Left Arm, Patient Position: Sitting)   Pulse 90   Temp 98.1 F (36.7 C) (Oral)   Ht 5\' 4"  (1.626 m)   Wt 203 lb (92.1 kg)   LMP  (LMP Unknown)   SpO2 97%   BMI 34.84 kg/m   Wt Readings from Last 3 Encounters:  10/10/18 203 lb (92.1 kg)  10/01/18 203 lb 8 oz (92.3 kg)  07/10/18 193 lb 1.6 oz (87.6 kg)    Physical Exam  Constitutional: She is oriented to person, place, and time. She appears well-developed and well-nourished.  HENT:  Head: Normocephalic and atraumatic. Head is without raccoon's eyes.  Right Ear: Hearing, tympanic membrane, external ear and ear canal normal.  Left Ear: Hearing, external ear and ear canal normal. Tympanic membrane is erythematous. A middle ear effusion is present.  Nose: Rhinorrhea present. Right sinus exhibits no maxillary sinus tenderness and no frontal sinus tenderness. Left sinus exhibits maxillary sinus tenderness and frontal sinus tenderness.  Mouth/Throat: Oropharynx is clear and moist.  Eyes: Pupils are equal, round, and reactive to light. Conjunctivae and EOM are normal. Right eye exhibits no discharge. Left eye exhibits no discharge.  Neck: Normal range of motion. Neck supple. No JVD present. Carotid bruit is not present. No thyromegaly present.  Cardiovascular: Normal rate, regular rhythm, normal heart sounds and intact distal pulses.  Pulmonary/Chest: Effort normal and breath sounds normal.  Abdominal: Soft. Bowel sounds are normal. There is no splenomegaly or  hepatomegaly.  Musculoskeletal: Normal range of motion.  Lymphadenopathy:       Head (right side): No tonsillar, no preauricular and no posterior auricular adenopathy present.       Head (left side): No tonsillar, no preauricular and no posterior auricular adenopathy present.    She has no cervical adenopathy.  Neurological: She is alert and oriented to person, place, and time. She has normal reflexes.  Reflex Scores:      Brachioradialis reflexes are 2+ on the right side and 2+ on the left side.      Patellar reflexes are 2+ on the right side and 2+ on the left side. Skin: Skin is warm and dry.  Psychiatric: She has a normal mood and affect. Her behavior is normal.    Results for orders placed or performed in visit on 10/10/18  Comprehensive metabolic panel  Result Value Ref Range   Glucose 148 (H) 65 - 99 mg/dL   BUN 11 8 - 27 mg/dL   Creatinine, Ser 1.61 (H) 0.57 - 1.00 mg/dL   GFR calc non Af Amer 50 (L) >59 mL/min/1.73   GFR calc Af Amer 58 (L) >59 mL/min/1.73   BUN/Creatinine Ratio 9 (L) 12 - 28   Sodium 143 134 - 144 mmol/L   Potassium 3.9 3.5 - 5.2 mmol/L   Chloride 108 (H) 96 - 106 mmol/L   CO2 22 20 - 29 mmol/L  Calcium 9.2 8.7 - 10.3 mg/dL   Total Protein 6.7 6.0 - 8.5 g/dL   Albumin 3.7 3.6 - 4.8 g/dL   Globulin, Total 3.0 1.5 - 4.5 g/dL   Albumin/Globulin Ratio 1.2 1.2 - 2.2   Bilirubin Total 0.2 0.0 - 1.2 mg/dL   Alkaline Phosphatase 76 39 - 117 IU/L   AST 25 0 - 40 IU/L   ALT 23 0 - 32 IU/L  Bayer DCA Hb A1c Waived  Result Value Ref Range   HB A1C (BAYER DCA - WAIVED) 7.0 (H) <7.0 %  Lipid Panel w/o Chol/HDL Ratio  Result Value Ref Range   Cholesterol, Total 135 100 - 199 mg/dL   Triglycerides 102 0 - 149 mg/dL   HDL 58 >72 mg/dL   VLDL Cholesterol Cal 22 5 - 40 mg/dL   LDL Calculated 55 0 - 99 mg/dL  Z36  Result Value Ref Range   Vitamin B-12 1,194 232 - 1,245 pg/mL  Magnesium  Result Value Ref Range   Magnesium 1.6 1.6 - 2.3 mg/dL  Microalbumin,  Urine Waived (STAT)  Result Value Ref Range   Microalb, Ur Waived 10 0 - 19 mg/L   Creatinine, Urine Waived 50 10 - 300 mg/dL   Microalb/Creat Ratio <30 <30 mg/g      Assessment & Plan:   Problem List Items Addressed This Visit      Cardiovascular and Mediastinum   Hypertension    Chronic, with goal <130/90.  Initial BP slightly elevated today, but repeat improved.  Continue current medication regimen.      Relevant Orders   Magnesium (Completed)     Respiratory   Acute recurrent maxillary sinusitis    Switch to Doxycycline which has benefited patient in past, 100MG  BID x 7 days with probiotic yogurt twice a day for GI protection.  Continue Flonase and Claritin.  Return for worsening symptoms.  Consider ENT consult if continued infections.        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  Continue to focus on exercise and diet, as improvement noted at this time.  Recheck in 3 months.        Genitourinary   CKD (chronic kidney disease), stage III (HCC)    Chronic, continue Micardis for kidney protection. Renally dose medications.        Other   Hyperlipidemia    Chronic, stable.  Continue current med regimen.      Relevant Orders   Lipid Panel w/o Chol/HDL Ratio (Completed)       Follow up plan: Return in about 3 months (around 01/10/2019) for T2DM and HTN/HLD (needs labs).

## 2018-10-10 NOTE — Assessment & Plan Note (Signed)
Switch to Doxycycline which has benefited patient in past, 100MG  BID x 7 days with probiotic yogurt twice a day for GI protection.  Continue Flonase and Claritin.  Return for worsening symptoms.  Consider ENT consult if continued infections.

## 2018-10-10 NOTE — Assessment & Plan Note (Signed)
Chronic, continue Micardis for kidney protection. Renally dose medications.

## 2018-10-10 NOTE — Assessment & Plan Note (Addendum)
Chronic, with goal <130/90.  Initial BP slightly elevated today, but repeat improved.  Continue current medication regimen.

## 2018-10-10 NOTE — Assessment & Plan Note (Addendum)
Chronic, stable.  Continue current med regimen. 

## 2018-10-11 LAB — LIPID PANEL W/O CHOL/HDL RATIO
Cholesterol, Total: 135 mg/dL (ref 100–199)
HDL: 58 mg/dL (ref >39)
LDL Calculated: 55 mg/dL (ref 0–99)
Triglycerides: 111 mg/dL (ref 0–149)
VLDL Cholesterol Cal: 22 mg/dL (ref 5–40)

## 2018-10-11 LAB — COMPREHENSIVE METABOLIC PANEL
ALT: 23 IU/L (ref 0–32)
AST: 25 IU/L (ref 0–40)
Albumin/Globulin Ratio: 1.2 (ref 1.2–2.2)
Albumin: 3.7 g/dL (ref 3.6–4.8)
Alkaline Phosphatase: 76 IU/L (ref 39–117)
BUN/Creatinine Ratio: 9 — ABNORMAL LOW (ref 12–28)
BUN: 11 mg/dL (ref 8–27)
Bilirubin Total: 0.2 mg/dL (ref 0.0–1.2)
CO2: 22 mmol/L (ref 20–29)
Calcium: 9.2 mg/dL (ref 8.7–10.3)
Chloride: 108 mmol/L — ABNORMAL HIGH (ref 96–106)
Creatinine, Ser: 1.17 mg/dL — ABNORMAL HIGH (ref 0.57–1.00)
GFR calc Af Amer: 58 mL/min/{1.73_m2} — ABNORMAL LOW (ref >59)
GFR calc non Af Amer: 50 mL/min/{1.73_m2} — ABNORMAL LOW (ref >59)
Globulin, Total: 3 g/dL (ref 1.5–4.5)
Glucose: 148 mg/dL — ABNORMAL HIGH (ref 65–99)
Potassium: 3.9 mmol/L (ref 3.5–5.2)
Sodium: 143 mmol/L (ref 134–144)
Total Protein: 6.7 g/dL (ref 6.0–8.5)

## 2018-10-11 LAB — VITAMIN B12: Vitamin B-12: 1194 pg/mL (ref 232–1245)

## 2018-10-11 LAB — MAGNESIUM: Magnesium: 1.6 mg/dL (ref 1.6–2.3)

## 2018-12-07 ENCOUNTER — Other Ambulatory Visit: Payer: Self-pay | Admitting: Physician Assistant

## 2018-12-07 DIAGNOSIS — I10 Essential (primary) hypertension: Secondary | ICD-10-CM

## 2018-12-13 ENCOUNTER — Ambulatory Visit: Payer: Self-pay | Admitting: *Deleted

## 2018-12-13 ENCOUNTER — Emergency Department: Payer: Medicare Other

## 2018-12-13 ENCOUNTER — Telehealth: Payer: Self-pay | Admitting: Family Medicine

## 2018-12-13 ENCOUNTER — Other Ambulatory Visit: Payer: Self-pay

## 2018-12-13 ENCOUNTER — Emergency Department
Admission: EM | Admit: 2018-12-13 | Discharge: 2018-12-13 | Disposition: A | Discharge status: Home / Self Care | Payer: Medicare Other | Attending: Emergency Medicine | Admitting: Emergency Medicine

## 2018-12-13 DIAGNOSIS — N183 Chronic kidney disease, stage 3 (moderate): Secondary | ICD-10-CM | POA: Diagnosis not present

## 2018-12-13 DIAGNOSIS — Z87891 Personal history of nicotine dependence: Secondary | ICD-10-CM | POA: Insufficient documentation

## 2018-12-13 DIAGNOSIS — K219 Gastro-esophageal reflux disease without esophagitis: Secondary | ICD-10-CM | POA: Diagnosis present

## 2018-12-13 DIAGNOSIS — E1122 Type 2 diabetes mellitus with diabetic chronic kidney disease: Secondary | ICD-10-CM | POA: Diagnosis not present

## 2018-12-13 DIAGNOSIS — Z79899 Other long term (current) drug therapy: Secondary | ICD-10-CM | POA: Insufficient documentation

## 2018-12-13 DIAGNOSIS — I129 Hypertensive chronic kidney disease with stage 1 through stage 4 chronic kidney disease, or unspecified chronic kidney disease: Secondary | ICD-10-CM | POA: Diagnosis not present

## 2018-12-13 DIAGNOSIS — E1165 Type 2 diabetes mellitus with hyperglycemia: Secondary | ICD-10-CM | POA: Diagnosis not present

## 2018-12-13 LAB — CBC
HCT: 41.2 % (ref 36.0–46.0)
Hemoglobin: 14 g/dL (ref 12.0–15.0)
MCH: 29.5 pg (ref 26.0–34.0)
MCHC: 34 g/dL (ref 30.0–36.0)
MCV: 86.9 fL (ref 80.0–100.0)
Platelets: 214 10*3/uL (ref 150–400)
RBC: 4.74 MIL/uL (ref 3.87–5.11)
RDW: 12 % (ref 11.5–15.5)
WBC: 5.7 10*3/uL (ref 4.0–10.5)
nRBC: 0 % (ref 0.0–0.2)

## 2018-12-13 LAB — BASIC METABOLIC PANEL
Anion gap: 12 (ref 5–15)
BUN: 30 mg/dL — ABNORMAL HIGH (ref 8–23)
CO2: 21 mmol/L — ABNORMAL LOW (ref 22–32)
Calcium: 9.3 mg/dL (ref 8.9–10.3)
Chloride: 93 mmol/L — ABNORMAL LOW (ref 98–111)
Creatinine, Ser: 1.39 mg/dL — ABNORMAL HIGH (ref 0.44–1.00)
GFR calc Af Amer: 47 mL/min — ABNORMAL LOW (ref >60)
GFR calc non Af Amer: 41 mL/min — ABNORMAL LOW (ref >60)
Glucose, Bld: 631 mg/dL (ref 70–99)
Potassium: 5.4 mmol/L — ABNORMAL HIGH (ref 3.5–5.1)
Sodium: 126 mmol/L — ABNORMAL LOW (ref 135–145)

## 2018-12-13 LAB — URINALYSIS, COMPLETE (UACMP) WITH MICROSCOPIC
Bacteria, UA: NONE SEEN
Bilirubin Urine: NEGATIVE
Glucose, UA: >=500 mg/dL — AB
Hgb urine dipstick: NEGATIVE
Ketones, ur: 5 mg/dL — AB
Leukocytes, UA: NEGATIVE
Nitrite: NEGATIVE
Protein, ur: NEGATIVE mg/dL
Specific Gravity, Urine: 1.027 (ref 1.005–1.030)
pH: 6 (ref 5.0–8.0)

## 2018-12-13 LAB — GLUCOSE, CAPILLARY
Glucose-Capillary: 594 mg/dL (ref 70–99)
Glucose-Capillary: 490 mg/dL — ABNORMAL HIGH (ref 70–99)
Glucose-Capillary: 305 mg/dL — ABNORMAL HIGH (ref 70–99)

## 2018-12-13 LAB — TROPONIN I: Troponin I: <0.03 ng/mL (ref <0.03)

## 2018-12-13 MED ORDER — SODIUM CHLORIDE 0.9 % IV BOLUS
1000.0000 mL | Freq: Once | INTRAVENOUS | Status: AC
  Administered 2018-12-13: 1000 mL via INTRAVENOUS

## 2018-12-13 MED ORDER — INSULIN ASPART 100 UNIT/ML ~~LOC~~ SOLN
10.0000 [IU] | Freq: Once | SUBCUTANEOUS | Status: AC
  Administered 2018-12-13: 10 [IU] via INTRAVENOUS
  Filled 2018-12-13: qty 1

## 2018-12-13 MED ORDER — EMPAGLIFLOZIN 25 MG PO TABS: 25.0000 mg | ORAL_TABLET | Freq: Every day | ORAL | 1 refills | Status: DC

## 2018-12-13 NOTE — Telephone Encounter (Signed)
Based on fact is symptomatic for 2 days I would recommend ER.  May need to r/o infection and if sugars high may require IV hydration if elevation in BS on labs.

## 2018-12-13 NOTE — Telephone Encounter (Signed)
Pt arrived at the ER at 9:44AM.

## 2018-12-13 NOTE — Telephone Encounter (Signed)
Pt called with complaints of her blood sugar is 587; she says that on 12/12/2018 her meter was reading "high" on 12/11/2018 and 12/12/2018; she says that she has been maintaining it through diet; the pt says that she has been feeling lightheaded, nauseous, and sweaty since 12/11/2018; recommendations made per nurse triage protocol; spoke with Christan and she request that this information be routed to the office for provider's recommendations; pt last seen by Aura Dials 10/10/2018; informed pt and she is awaiting their call; she can be contacted at [phone]; pt also instructed to bring her meter to appointment/ED so that it can be interrogated; will route per request.  Reason for Disposition . Blood glucose > 500 mg/dL (89.2 mmol/L)  Answer Assessment - Initial Assessment Questions 1. BLOOD GLUCOSE: "What is your blood glucose level?"      587 2. ONSET: "When did you check the blood glucose?"     12/13/2018 at 0807 3. USUAL RANGE: "What is your glucose level usually?" (e.g., usual fasting morning value, usual evening value)     Pt not sure 4. KETONES: "Do you check for ketones (urine or blood test strips)?" If yes, ask: "What does the test show now?"      no 5. TYPE 1 or 2:  "Do you know what type of diabetes you have?"  (e.g., Type 1, Type 2, Gestational; doesn't know)      Type II 6. INSULIN: "Do you take insulin?" "What type of insulin(s) do you use? What is the mode of delivery? (syringe, pen (e.g., injection or  pump)?"      no 7. DIABETES PILLS: "Do you take any pills for your diabetes?" If yes, ask: "Have you missed taking any pills recently?"     no 8. OTHER SYMPTOMS: "Do you have any symptoms?" (e.g., fever, frequent urination, difficulty breathing, dizziness, weakness, vomiting)     Nausea, light headed, sweaty 9. PREGNANCY: "Is there any chance you are pregnant?" "When was your last menstrual period?"     no  Protocols used: DIABETES - HIGH BLOOD SUGAR-A-AH

## 2018-12-13 NOTE — Telephone Encounter (Signed)
Spoke with pt and she stated that she has been drinking a lot and doesn't think she needs fluids but she will go to the ER since that was your suggestion

## 2018-12-13 NOTE — ED Triage Notes (Signed)
Pt c/o blood sugar running high per her meter at home for the couple of days,, states she has been taking a couple new supplements recently . Does have c/o nausea with dizziness,. Feeling jittery.

## 2018-12-13 NOTE — Telephone Encounter (Signed)
Called by ER doctor- doing better with fluids, sugars coming down. They were going to send her home on metformin and she is going to follow up here on Wednesday. Patient refuses metformin. Recommended starting her 25mg  jardiance and we will follow up on Wednesday. FYI to PCP.

## 2018-12-13 NOTE — Telephone Encounter (Signed)
Thanks.  I stopped by ER and saw her.  She looked great.  Yes, she refuses Metformin.  She may not take Jardiance if it costs too much, but we will see.  I told her to make an appointment for next week, so I am glad she did.

## 2018-12-13 NOTE — ED Provider Notes (Signed)
[facility] Emergency Department Provider Note  ____________________________________________  Time seen: Approximately 2:07 PM  I have reviewed the triage vital signs and the nursing notes.   HISTORY  Chief Complaint Hyperglycemia   HPI Kim Thomas is a 63 y.o. female with a history of type 2 diabetes, hypertension, chronic kidney disease who presents for evaluation of hyperglycemia.  Patient reports her primary care doctor took her off of metformin 1.5 years ago as she was able to lose weight and exercise and her hemoglobin A1c improved.  Patient reports that around the holiday season she had several alcoholic beverages which she had not had in several years, also experimented several different foods and then started taking some over-the-counter tumeric and ginger supplementation.  She reports it for several weeks she started to have significant reflux which she describes as burning sensation in her throat mostly at nighttime when she lays down to sleep.  She started taking over-the-counter medication for gas and reflux.  Over the last week she started feeling dizzy like she was going to pass out and therefore she started checking her glucose.  She reports that her meter was reading high and she thought the meter was broken.  She reports that he has been reading high for several days.  She has had polyuria however she attributed to just drinking more water to try to keep herself hydrated for the supplements that she was taking.  She has not had any chest pain or GERD-like symptoms for the last 10 days, no abdominal pain, no nausea or vomiting, no fever or chills, no URI symptoms, no shortness of breath, no dysuria.   Past Medical History:  Diagnosis Date  . Anxiety   . Chronic kidney disease   . DDD (degenerative disc disease), cervical   . Diabetes mellitus without complication ([place])   . Gout   . Hypertension   . Sleep apnea     Patient Active Problem List     Diagnosis Date Noted  . Acute recurrent maxillary sinusitis 10/10/2018  . Hyperlipidemia 04/03/2018  . Polypharmacy 06/30/2017  . Advanced care planning/counseling discussion 02/20/2017  . Risk for falls 02/20/2017  . Pain management 02/20/2017  . Insomnia 06/21/2016  . Elevated uric acid in blood 06/21/2016  . Recurrent boils 06/21/2016  . Pain in left knee 06/06/2016  . Allergic rhinitis 06/06/2016  . Abscess of vulva 06/06/2016  . Low back pain 06/06/2016  . Major depressive disorder, recurrent episode, moderate ([place]) 06/06/2016  . CKD (chronic kidney disease), stage III ([place]) 06/06/2016  . Type 2 diabetes mellitus with diabetic nephropathy ([place]) 05/25/2016  . Hypertension 05/25/2016  . RLS (restless legs syndrome) 05/25/2016  . Arthritis of hand, right 05/25/2016  . Sleep apnea 05/25/2016  . Prominent metatarsal head 01/01/2016  . Hammertoe 01/01/2016  . Numbness and tingling 09/08/2015  . 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 BREAST BIOPSY Left    benign  . BREAST CYST EXCISION Left   . BREAST EXCISIONAL BIOPSY Left   . c section     x2  . CHOLECYSTECTOMY    . FOOT SURGERY Bilateral   . KNEE ARTHROSCOPY Left 07/18/2016   Procedure: ARTHROSCOPY KNEE;  Surgeon: Dereck Leep, MD;  Location: [facility] ORS;  Service: Orthopedics;  Laterality: Left;  . KNEE ARTHROSCOPY WITH LATERAL MENISECTOMY Left 07/18/2016   Procedure: KNEE ARTHROSCOPY WITH PARTIAL  LATERAL MENISECTOMY, CHONDROPLASTY LATERAL FEMORAL CONDYLE;  Surgeon: Dereck Leep,  MD;  Location: [facility] ORS;  Service: Orthopedics;  Laterality: Left;  . SPINE SURGERY  11/09/11   C5  . TUBAL LIGATION      Prior to Admission medications   Medication Sig Start Date End Date Taking? Authorizing Provider  acetaminophen (TYLENOL) 500 MG tablet Take 500 mg by mouth daily as needed.    [provider]  atorvastatin (LIPITOR) 20 MG tablet Take 1 tablet (20 mg total) by mouth  daily. 04/03/18   Kathrine Haddock, NP  Blood Glucose Monitoring Suppl (ONE TOUCH ULTRA MINI) w/Device KIT As directed 01/24/18   Kathrine Haddock, NP  cetirizine (ZYRTEC) 10 MG tablet Take 1 tablet (10 mg total) by mouth daily. 10/01/18   Volney American, PA-C  Cholecalciferol (VITAMIN D3) 2000 units TABS Take 2,000 Units by mouth daily.    [provider]  CINNAMON PO Take 2 capsules daily by mouth.    [provider]  diclofenac sodium (VOLTAREN) 1 % GEL Apply topically. 01/24/18   [provider]  DULoxetine (CYMBALTA) 20 MG capsule TAKE 1 CAPSULE BY MOUTH  DAILY 08/18/17   Kathrine Haddock, NP  empagliflozin (JARDIANCE) 25 MG TABS tablet Take 25 mg by mouth daily. 12/13/18   Rudene Re, MD  fluticasone [facility]) 50 MCG/ACT nasal spray Place 1-2 sprays into both nostrils 2 (two) times daily. 10/01/18   Volney American, PA-C  gabapentin (NEURONTIN) 400 MG capsule Take 1 capsule by mouth 3 (three) times daily. 01/24/18   [provider]  Glucosamine-Chondroit-Vit C-Mn (GLUCOSAMINE CHONDR 1500 COMPLX PO) Take by mouth.    [provider]  glucose blood (ONE TOUCH ULTRA TEST) test strip USE TO CHECK BLOOD SUGAR  DAILY 07/06/18   Trinna Post, PA-C  Magnesium 250 MG TABS Take 250 mg by mouth daily.    [provider]  Multiple Vitamin (MULTIVITAMIN) tablet Take 1 tablet by mouth daily.    [provider]  Omega-3 1000 MG CAPS Take by mouth.    [provider]  [facility] DELICA LANCETS FINE MISC USE TO CHECK BLOOD SUGAR  DAILY 09/25/18   Cannady, Henrine Screws T, NP  OVER THE COUNTER MEDICATION Take 1 tablet by mouth 2 (two) times daily. Ultimate joint repair    [provider]  pyridOXINE (VITAMIN B-6) 100 MG tablet Take 100 mg by mouth daily.    [provider]  telmisartan (MICARDIS) 40 MG tablet TAKE 1 TABLET BY MOUTH ONCE DAILY 12/11/18   Cannady, Henrine Screws T, NP  triamcinolone cream (KENALOG) 0.1 % Apply 1  application topically 2 (two) times daily.    [provider]  UNABLE TO FIND Cherry Juice- pt states she takes 1 tsp daily at bedtime    [provider]  vitamin B-12 (CYANOCOBALAMIN) 500 MCG tablet Take 500 mcg by mouth daily.    [provider]    Allergies Morphine and related; Erythromycin; and Hydrocodone-acetaminophen  Family History  Problem Relation Age of Onset  . Cancer Mother        breast  . Hypertension Mother   . Hyperlipidemia Mother   . Diabetes Mother   . Glaucoma Mother   . Cataracts Mother   . Breast cancer Mother   . Cancer Father   . Cancer Maternal Grandfather   . Breast cancer Maternal Aunt   . Breast cancer Cousin   . Alcohol abuse Neg Hx     Social History Social History   Tobacco Use  . Smoking status: Former Smoker  Last attempt to quit: 05/21/2011    Years since quitting: 7.5  . Smokeless tobacco: Never Used  Substance Use Topics  . Alcohol use: No    Alcohol/week: 0.0 standard drinks  . Drug use: No    Review of Systems  Constitutional: Negative for fever. + Lightheadedness Eyes: Negative for visual changes. ENT: Negative for sore throat. Neck: No neck pain  Cardiovascular: Negative for chest pain. Respiratory: Negative for shortness of breath. Gastrointestinal: Negative for abdominal pain, vomiting or diarrhea. Genitourinary: Negative for dysuria. Musculoskeletal: Negative for back pain. Skin: Negative for rash. Neurological: Negative for headaches, weakness or numbness. Psych: No SI or HI  ____________________________________________   PHYSICAL EXAM:  VITAL SIGNS: ED Triage Vitals  Enc Vitals Group     BP 12/13/18 1016 134/73     Pulse Rate 12/13/18 1016 (!) 114     Resp 12/13/18 1016 18     Temp 12/13/18 1016 98.6 F (37 C)     Temp Source 12/13/18 1016 Oral     SpO2 12/13/18 1016 97 %     Weight 12/13/18 1017 192 lb (87.1 kg)     Height 12/13/18 1017 '5\' 4"'  (1.626 m)     Head  Circumference --      Peak Flow --      Pain Score 12/13/18 1017 0     Pain Loc --      Pain Edu? --      Excl. in [place]? --     Constitutional: Alert and oriented. Well appearing and in no apparent distress. HEENT:      Head: Normocephalic and atraumatic.         Eyes: Conjunctivae are normal. Sclera is non-icteric.       Mouth/Throat: Mucous membranes are moist.       Neck: Supple with no signs of meningismus. Cardiovascular: Regular rate and rhythm. No murmurs, gallops, or rubs. 2+ symmetrical distal pulses are present in all extremities. No JVD. Respiratory: Normal respiratory effort. Lungs are clear to auscultation bilaterally. No wheezes, crackles, or rhonchi.  Gastrointestinal: Soft, non tender, and non distended with positive bowel sounds. No rebound or guarding. Musculoskeletal: Nontender with normal range of motion in all extremities. No edema, cyanosis, or erythema of extremities. Neurologic: Normal speech and language. Face is symmetric. Moving all extremities. No gross focal neurologic deficits are appreciated. Skin: Skin is warm, dry and intact. No rash noted. Psychiatric: Mood and affect are normal. Speech and behavior are normal.  ____________________________________________   LABS (all labs ordered are listed, but only abnormal results are displayed)  Labs Reviewed  GLUCOSE, CAPILLARY - Abnormal; Notable for the following components:      Result Value   Glucose-Capillary 594 (*)    All other components within normal limits  BASIC METABOLIC PANEL - Abnormal; Notable for the following components:   Sodium 126 (*)    Potassium 5.4 (*)    Chloride 93 (*)    CO2 21 (*)    Glucose, Bld 631 (*)    BUN 30 (*)    Creatinine, Ser 1.39 (*)    GFR calc non Af Amer 41 (*)    GFR calc Af Amer 47 (*)    All other components within normal limits  URINALYSIS, COMPLETE (UACMP) WITH MICROSCOPIC - Abnormal; Notable for the following components:   Color, Urine YELLOW (*)     APPearance CLEAR (*)    Glucose, UA >=500 (*)    Ketones, ur 5 (*)  All other components within normal limits  GLUCOSE, CAPILLARY - Abnormal; Notable for the following components:   Glucose-Capillary 490 (*)    All other components within normal limits  GLUCOSE, CAPILLARY - Abnormal; Notable for the following components:   Glucose-Capillary 305 (*)    All other components within normal limits  CBC  TROPONIN I  CBG MONITORING, ED   ____________________________________________  EKG  ED ECG REPORT I, Rudene Re, the attending physician, personally viewed and interpreted this ECG.  Normal sinus rhythm, rate of 86, normal intervals, normal axis, no ST elevations or depressions. Unchanged from prior ____________________________________________  RADIOLOGY  I have personally reviewed the images performed during this visit and I agree with the Radiologist's read.   Interpretation by Radiologist:  Dg Chest 2 View  Result Date: 12/13/2018 CLINICAL DATA:  Hyperglycemia. EXAM: CHEST - 2 VIEW COMPARISON:  None. FINDINGS: The heart size and mediastinal contours are within normal limits. There is mild atelectasis of both lung bases. There is no focal pneumonia, pulmonary edema, or pleural effusion. Minimal degenerative joint changes of the spine are noted. IMPRESSION: Mild atelectasis of bilateral lung bases.  No focal pneumonia noted. Electronically Signed   By: Abelardo Diesel M.D.   On: 12/13/2018 14:40      ____________________________________________   PROCEDURES  Procedure(s) performed: None Procedures Critical Care performed:  None ____________________________________________   INITIAL IMPRESSION / ASSESSMENT AND PLAN / ED COURSE  63 y.o. female with a history of type 2 diabetes, hypertension, chronic kidney disease who presents for evaluation of hyperglycemia.  Patient has been off of metformin for 1.5 years and has been controlling her diabetes with diet and exercise.   Has had several dietary indiscretions and new over-the-counter supplements that she has been using over the last 3 weeks.  No infectious etiology.  Was also complaining of GERD-like symptoms 10 days ago.  EKG here shows no ischemic changes.  Will check troponin x1.  She has no abdominal pain or tenderness.  Labs show hyperglycemia with no evidence of DKA.  Initial glucose of 631 which has improved after liter fluid to 490.  Will give a second liter fluid and 10 units of IV insulin.  Patient will need to be discharged back on a oral glycemic agent.  She is refusing to be put back on metformin.  Will discuss with her primary care doctor and alternative option.  Clinical Course as of Dec 14 1599  Thu Dec 13, 2018  1503 Poke with Dr. Wynetta Emery from patient's primary care doctor's office who recommended started patient on Jardiance 25m daily since patient is refusing to be started on metformin.  They will see her next week on a already scheduled appointment.   [CV]    Clinical Course User Index [CV] VAlfred Levins [place] MD   _________________________ 4:01 PM on 12/13/2018 ----------------------------------------- Repeat blood glucose at 305.  At this time will discharge patient with close follow-up with primary care doctor on her already scheduled appointment tomorrow.   As part of my medical decision making, I reviewed the following data within the [employer] reviewed and incorporated, Labs reviewed , EKG interpreted , Old EKG reviewed, Radiograph reviewed , A consult was requested and obtained from this/these consultant(s) PCP, Notes from prior ED visits and [place] Controlled Substance Database    Pertinent labs & imaging results that were available during my care of the patient were reviewed by me and considered in my medical decision making (see chart for details).  ____________________________________________   FINAL CLINICAL IMPRESSION(S) / ED  DIAGNOSES  Final diagnoses:  Type 2 diabetes mellitus with hyperglycemia, without long-term current use of insulin (HCC)      NEW MEDICATIONS STARTED DURING THIS VISIT:  ED Discharge Orders         Ordered    empagliflozin (JARDIANCE) 25 MG TABS tablet  Daily     12/13/18 1505           Note:  This document was prepared using Dragon voice recognition software and may include unintentional dictation errors.    Alfred Levins, [place], MD 12/13/18 [phone]

## 2018-12-17 ENCOUNTER — Encounter: Payer: Self-pay | Admitting: Nurse Practitioner

## 2018-12-17 ENCOUNTER — Other Ambulatory Visit: Payer: Self-pay

## 2018-12-17 ENCOUNTER — Ambulatory Visit (INDEPENDENT_AMBULATORY_CARE_PROVIDER_SITE_OTHER): Payer: Medicare Other | Admitting: Nurse Practitioner

## 2018-12-17 VITALS — BP 114/75 | HR 78 | Temp 98.1°F | Ht 64.0 in | Wt 190.0 lb

## 2018-12-17 DIAGNOSIS — E1121 Type 2 diabetes mellitus with diabetic nephropathy: Secondary | ICD-10-CM | POA: Diagnosis not present

## 2018-12-17 MED ORDER — ONDANSETRON HCL 4 MG PO TABS: 4.0000 mg | ORAL_TABLET | Freq: Three times a day (TID) | ORAL | 0 refills | Status: DC | PRN

## 2018-12-17 MED ORDER — SEMAGLUTIDE(0.25 OR 0.5MG/DOS) 2 MG/1.5ML ~~LOC~~ SOPN: 0.2500 mg | PEN_INJECTOR | SUBCUTANEOUS | 3 refills | Status: DC

## 2018-12-17 NOTE — Assessment & Plan Note (Addendum)
Chronic, ongoing with continued FSBS elevation.  Continue Jardiance as ordered and add on Ozempic (script sent).  Patient was shown how to inject and provided initial injection in office today (0.25 MG weekly x 4 weeks and then increase to 0.5 MG weekly).  To return in February for A1C and follow-up.  Last A1C 10/10/18 was 7.0%.  Diabetic nutrition education referral placed.

## 2018-12-17 NOTE — Progress Notes (Signed)
BP 114/75   Pulse 78 Comment: apical  Temp 98.1 F (36.7 C) (Oral)   Ht 5\' 4"  (1.626 m)   Wt 190 lb (86.2 kg)   LMP  (LMP Unknown)   SpO2 96%   BMI 32.61 kg/m    Subjective:    Patient ID: Kim Thomas, female    DOB: [birth year], 63 y.o.   MRN: [id number]  HPI: Kim Aquasngel [place] is a 63 y.o. female  Chief Complaint  Patient presents with  . Hospitalization Follow-up    pt states went to the hospital with high blood sugar/ pt states she was prescribed jardiance 25 mg but it only helps for about 5-6 hours   ER FOLLOW UP Was admitted to ER on 12/13/2018 for elevated blood sugar in 500 range.  IV fluids were initiated and upon discharge glucose was 305.  She was started on Jardiance after refusing to take Metformin.  At baseline patient has refused Metformin frequently in past reporting a nephrologist had taken her off this years back because her kidney function decreased and after discontinuation of Metformin it improved some per her report.  Her last A1C on 10/10/18 was 7.0%.  She checks her blood sugars at home daily and is very aware of symptoms of hyper and hypoglycemia.  She has brought her blood sugars in today: 306 this morning was reading.  Since discharge from hospital 315 to 562.  She continues to take her garlic and vinegar at home as needed, which she feels helps her blood sugar.  Reports she has been eating every 2 hours because she sometimes "gets shaky" and when asked what she is eating she states "strawberries, yogurt, grapes".  Discussed with her that fruit has sugar content.  She states she did go to diabetes nutrition classes in past, but has not gone in awhile and would like a refresher.  Her goal is be off medication, discussed with patient the reality that this may not happen but if improvement in future we can trial reducing. Time since discharge: 4 days  Hospital/facility: [facility] Diagnosis: Hyperglycemia Procedures/tests: Labs, CMP, CBC, urine Consultants: none New  medications: Jardiance Discharge instructions:  Follow-up with PCP Status: better   Relevant past medical, surgical, family and social history reviewed and updated as indicated. Interim medical history since our last visit reviewed. Allergies and medications reviewed and updated.  Review of Systems  Constitutional: Negative for activity change, appetite change, diaphoresis, fatigue and fever.  Respiratory: Negative for cough, chest tightness and shortness of breath.   Cardiovascular: Negative for chest pain, palpitations and leg swelling.  Gastrointestinal: Negative for abdominal distention, abdominal pain, constipation, diarrhea, nausea and vomiting.  Endocrine: Negative for cold intolerance, heat intolerance, polydipsia, polyphagia and polyuria.  Neurological: Negative for dizziness, syncope, weakness, light-headedness, numbness and headaches.  Psychiatric/Behavioral: Negative.     Per HPI unless specifically indicated above     Objective:    BP 114/75   Pulse 78 Comment: apical  Temp 98.1 F (36.7 C) (Oral)   Ht 5\' 4"  (1.626 m)   Wt 190 lb (86.2 kg)   LMP  (LMP Unknown)   SpO2 96%   BMI 32.61 kg/m   Wt Readings from Last 3 Encounters:  12/17/18 190 lb (86.2 kg)  12/13/18 192 lb (87.1 kg)  10/10/18 203 lb (92.1 kg)    Physical Exam Vitals signs and nursing note reviewed.  Constitutional:      General: She is awake.     Appearance: She is well-developed.  HENT:     Head: Normocephalic.     Right Ear: Hearing normal.     Left Ear: Hearing normal.     Nose: Nose normal.     Mouth/Throat:     Mouth: Mucous membranes are moist.  Eyes:     General: Lids are normal.        Right eye: No discharge.        Left eye: No discharge.     Conjunctiva/sclera: Conjunctivae normal.     Pupils: Pupils are equal, round, and reactive to light.  Neck:     Musculoskeletal: Normal range of motion and neck supple.     Thyroid: No thyromegaly.     Vascular: No carotid bruit or  JVD.  Cardiovascular:     Rate and Rhythm: Normal rate and regular rhythm.     Heart sounds: Normal heart sounds. No murmur. No gallop.   Pulmonary:     Effort: Pulmonary effort is normal.     Breath sounds: Normal breath sounds.  Abdominal:     General: Bowel sounds are normal.     Palpations: Abdomen is soft. There is no hepatomegaly or splenomegaly.  Musculoskeletal:     Right lower leg: No edema.     Left lower leg: No edema.  Lymphadenopathy:     Cervical: No cervical adenopathy.  Skin:    General: Skin is warm and dry.  Neurological:     Mental Status: She is alert and oriented to person, place, and time.  Psychiatric:        Attention and Perception: Attention normal.        Mood and Affect: Mood normal.        Behavior: Behavior normal. Behavior is cooperative.        Thought Content: Thought content normal.        Judgment: Judgment normal.     Results for orders placed or performed during the hospital encounter of 12/13/18  Glucose, capillary  Result Value Ref Range   Glucose-Capillary 594 (HH) 70 - 99 mg/dL   Comment 1 Notify RN   Basic metabolic panel  Result Value Ref Range   Sodium 126 (L) 135 - 145 mmol/L   Potassium 5.4 (H) 3.5 - 5.1 mmol/L   Chloride 93 (L) 98 - 111 mmol/L   CO2 21 (L) 22 - 32 mmol/L   Glucose, Bld 631 (HH) 70 - 99 mg/dL   BUN 30 (H) 8 - 23 mg/dL   Creatinine, Ser 4.091.39 (H) 0.44 - 1.00 mg/dL   Calcium 9.3 8.9 - 81.110.3 mg/dL   GFR calc non Af Amer 41 (L) >60 mL/min   GFR calc Af Amer 47 (L) >60 mL/min   Anion gap 12 5 - 15  CBC  Result Value Ref Range   WBC 5.7 4.0 - 10.5 K/uL   RBC 4.74 3.87 - 5.11 MIL/uL   Hemoglobin 14.0 12.0 - 15.0 g/dL   HCT 91.441.2 78.236.0 - 95.646.0 %   MCV 86.9 80.0 - 100.0 fL   MCH 29.5 26.0 - 34.0 pg   MCHC 34.0 30.0 - 36.0 g/dL   RDW 21.312.0 08.611.5 - 57.815.5 %   Platelets 214 150 - 400 K/uL   nRBC 0.0 0.0 - 0.2 %  Urinalysis, Complete w Microscopic  Result Value Ref Range   Color, Urine YELLOW (A) YELLOW   APPearance  CLEAR (A) CLEAR   Specific Gravity, Urine 1.027 1.005 - 1.030   pH 6.0 5.0 - 8.0  Glucose, UA >=500 (A) NEGATIVE mg/dL   Hgb urine dipstick NEGATIVE NEGATIVE   Bilirubin Urine NEGATIVE NEGATIVE   Ketones, ur 5 (A) NEGATIVE mg/dL   Protein, ur NEGATIVE NEGATIVE mg/dL   Nitrite NEGATIVE NEGATIVE   Leukocytes, UA NEGATIVE NEGATIVE   RBC / HPF 0-5 0 - 5 RBC/hpf   WBC, UA 0-5 0 - 5 WBC/hpf   Bacteria, UA NONE SEEN NONE SEEN   Squamous Epithelial / LPF 0-5 0 - 5   Mucus PRESENT   Glucose, capillary  Result Value Ref Range   Glucose-Capillary 490 (H) 70 - 99 mg/dL  Troponin I - Add-On to previous collection  Result Value Ref Range   Troponin I <0.03 <0.03 ng/mL  Glucose, capillary  Result Value Ref Range   Glucose-Capillary 305 (H) 70 - 99 mg/dL      Assessment & Plan:   Problem List Items Addressed This Visit      Endocrine   Type 2 diabetes mellitus with diabetic nephropathy (HCC) - Primary    Chronic, ongoing with continued FSBS elevation.  Continue Jardiance as ordered and add on Ozempic (script sent).  Patient was shown how to inject and provided initial injection in office today (0.25 MG weekly x 4 weeks and then increase to 0.5 MG weekly).  To return in February for A1C and follow-up.  Last A1C 10/10/18 was 7.0%.  Diabetic nutrition education referral placed.      Relevant Medications   Semaglutide,0.25 or 0.5MG /DOS, (OZEMPIC, 0.25 OR 0.5 MG/DOSE,) 2 MG/1.5ML SOPN   Other Relevant Orders   Glucose Hemocue Waived   Ambulatory referral to diabetic education       Follow up plan: Return in about 4 weeks (around 01/14/2019) for T2DM, HTN/HLD.

## 2018-12-17 NOTE — Patient Instructions (Signed)
Carbohydrate Counting for Diabetes Mellitus, Adult  Carbohydrate counting is a method of keeping track of how many carbohydrates you eat. Eating carbohydrates naturally increases the amount of sugar (glucose) in the blood. Counting how many carbohydrates you eat helps keep your blood glucose within normal limits, which helps you manage your diabetes (diabetes mellitus). It is important to know how many carbohydrates you can safely have in each meal. This is different for every person. A diet and nutrition specialist (registered dietitian) can help you make a meal plan and calculate how many carbohydrates you should have at each meal and snack. Carbohydrates are found in the following foods:  Grains, such as breads and cereals.  Dried beans and soy products.  Starchy vegetables, such as potatoes, peas, and corn.  Fruit and fruit juices.  Milk and yogurt.  Sweets and snack foods, such as cake, cookies, candy, chips, and soft drinks. How do I count carbohydrates? There are two ways to count carbohydrates in food. You can use either of the methods or a combination of both. Reading "Nutrition Facts" on packaged food The "Nutrition Facts" list is included on the labels of almost all packaged foods and beverages in the [place] It includes:  The serving size.  Information about nutrients in each serving, including the grams (g) of carbohydrate per serving. To use the "Nutrition Facts":  Decide how many servings you will have.  Multiply the number of servings by the number of carbohydrates per serving.  The resulting number is the total amount of carbohydrates that you will be having. Learning standard serving sizes of other foods When you eat carbohydrate foods that are not packaged or do not include "Nutrition Facts" on the label, you need to measure the servings in order to count the amount of carbohydrates:  Measure the foods that you will eat with a food scale or measuring cup, if  needed.  Decide how many standard-size servings you will eat.  Multiply the number of servings by 15. Most carbohydrate-rich foods have about 15 g of carbohydrates per serving. ? For example, if you eat 8 oz (170 g) of strawberries, you will have eaten 2 servings and 30 g of carbohydrates (2 servings x 15 g = 30 g).  For foods that have more than one food mixed, such as soups and casseroles, you must count the carbohydrates in each food that is included. The following list contains standard serving sizes of common carbohydrate-rich foods. Each of these servings has about 15 g of carbohydrates:   hamburger bun or  English muffin.   oz (15 mL) syrup.   oz (14 g) jelly.  1 slice of bread.  1 six-inch tortilla.  3 oz (85 g) cooked rice or pasta.  4 oz (113 g) cooked dried beans.  4 oz (113 g) starchy vegetable, such as peas, corn, or potatoes.  4 oz (113 g) hot cereal.  4 oz (113 g) mashed potatoes or  of a large baked potato.  4 oz (113 g) canned or frozen fruit.  4 oz (120 mL) fruit juice.  4-6 crackers.  6 chicken nuggets.  6 oz (170 g) unsweetened dry cereal.  6 oz (170 g) plain fat-free yogurt or yogurt sweetened with artificial sweeteners.  8 oz (240 mL) milk.  8 oz (170 g) fresh fruit or one small piece of fruit.  24 oz (680 g) popped popcorn. Example of carbohydrate counting Sample meal  3 oz (85 g) chicken breast.  6 oz (170 g)   brown rice.  4 oz (113 g) corn.  8 oz (240 mL) milk.  8 oz (170 g) strawberries with sugar-free whipped topping. Carbohydrate calculation 1. Identify the foods that contain carbohydrates: ? Rice. ? Corn. ? Milk. ? Strawberries. 2. Calculate how many servings you have of each food: ? 2 servings rice. ? 1 serving corn. ? 1 serving milk. ? 1 serving strawberries. 3. Multiply each number of servings by 15 g: ? 2 servings rice x 15 g = 30 g. ? 1 serving corn x 15 g = 15 g. ? 1 serving milk x 15 g = 15 g. ? 1  serving strawberries x 15 g = 15 g. 4. Add together all of the amounts to find the total grams of carbohydrates eaten: ? 30 g + 15 g + 15 g + 15 g = 75 g of carbohydrates total. Summary  Carbohydrate counting is a method of keeping track of how many carbohydrates you eat.  Eating carbohydrates naturally increases the amount of sugar (glucose) in the blood.  Counting how many carbohydrates you eat helps keep your blood glucose within normal limits, which helps you manage your diabetes.  A diet and nutrition specialist (registered dietitian) can help you make a meal plan and calculate how many carbohydrates you should have at each meal and snack. This information is not intended to replace advice given to you by your health care provider. Make sure you discuss any questions you have with your health care provider. Document Released: 11/14/2005 Document Revised: 05/24/2017 Document Reviewed: 04/27/2016 Elsevier Interactive Patient Education  2019 [employer] Semaglutide injection solution What is this medicine? SEMAGLUTIDE (Sem a GLOO tide) is used to improve blood sugar control in adults with type 2 diabetes. This medicine may be used with other diabetes medicines. This medicine may be used for other purposes; ask your health care provider or pharmacist if you have questions. COMMON BRAND NAME(S): OZEMPIC What should I tell my health care provider before I take this medicine? They need to know if you have any of these conditions: -endocrine tumors (MEN 2) or if someone in your family had these tumors -eye disease, vision problems -history of pancreatitis -kidney disease -stomach problems -thyroid cancer or if someone in your family had thyroid cancer -an unusual or allergic reaction to semaglutide, other medicines, foods, dyes, or preservatives -pregnant or trying to get pregnant -breast-feeding How should I use this medicine? This medicine is for injection under the skin of your  upper leg (thigh), stomach area, or upper arm. It is given once every week (every 7 days). You will be taught how to prepare and give this medicine. Use exactly as directed. Take your medicine at regular intervals. Do not take it more often than directed. If you use this medicine with insulin, you should inject this medicine and the insulin separately. Do not mix them together. Do not give the injections right next to each other. Change (rotate) injection sites with each injection. It is important that you put your used needles and syringes in a special sharps container. Do not put them in a trash can. If you do not have a sharps container, call your pharmacist or healthcare provider to get one. A special MedGuide will be given to you by the pharmacist with each prescription and refill. Be sure to read this information carefully each time. Talk to your pediatrician regarding the use of this medicine in children. Special care may be needed. Overdosage: If you think you have  taken too much of this medicine contact a poison control center or emergency room at once. NOTE: This medicine is only for you. Do not share this medicine with others. What if I miss a dose? If you miss a dose, take it as soon as you can within 5 days after the missed dose. Then take your next dose at your regular weekly time. If it has been longer than 5 days after the missed dose, do not take the missed dose. Take the next dose at your regular time. Do not take double or extra doses. If you have questions about a missed dose, contact your health care provider for advice. What may interact with this medicine? -other medicines for diabetes Many medications may cause changes in blood sugar, these include: -alcohol containing beverages -antiviral medicines for HIV or AIDS -aspirin and aspirin-like drugs -certain medicines for blood pressure, heart disease, irregular heart beat -chromium -diuretics -female hormones, such as  estrogens or progestins, birth control pills -fenofibrate -gemfibrozil -isoniazid -lanreotide -female hormones or anabolic steroids -MAOIs like Carbex, Eldepryl, Marplan, Nardil, and Parnate -medicines for weight loss -medicines for allergies, asthma, cold, or cough -medicines for depression, anxiety, or psychotic disturbances -niacin -nicotine -NSAIDs, medicines for pain and inflammation, like ibuprofen or naproxen -octreotide -pasireotide -pentamidine -phenytoin -probenecid -quinolone antibiotics such as ciprofloxacin, levofloxacin, ofloxacin -some herbal dietary supplements -steroid medicines such as prednisone or cortisone -sulfamethoxazole; trimethoprim -thyroid hormones Some medications can hide the warning symptoms of low blood sugar (hypoglycemia). You may need to monitor your blood sugar more closely if you are taking one of these medications. These include: -beta-blockers, often used for high blood pressure or heart problems (examples include atenolol, metoprolol, propranolol) -clonidine -guanethidine -reserpine This list may not describe all possible interactions. Give your health care provider a list of all the medicines, herbs, non-prescription drugs, or dietary supplements you use. Also tell them if you smoke, drink alcohol, or use illegal drugs. Some items may interact with your medicine. What should I watch for while using this medicine? Visit your doctor or health care professional for regular checks on your progress. Drink plenty of fluids while taking this medicine. Check with your doctor or health care professional if you get an attack of severe diarrhea, nausea, and vomiting. The loss of too much body fluid can make it dangerous for you to take this medicine. A test called the HbA1C (A1C) will be monitored. This is a simple blood test. It measures your blood sugar control over the last 2 to 3 months. You will receive this test every 3 to 6 months. Learn how to  check your blood sugar. Learn the symptoms of low and high blood sugar and how to manage them. Always carry a quick-source of sugar with you in case you have symptoms of low blood sugar. Examples include hard sugar candy or glucose tablets. Make sure others know that you can choke if you eat or drink when you develop serious symptoms of low blood sugar, such as seizures or unconsciousness. They must get medical help at once. Tell your doctor or health care professional if you have high blood sugar. You might need to change the dose of your medicine. If you are sick or exercising more than usual, you might need to change the dose of your medicine. Do not skip meals. Ask your doctor or health care professional if you should avoid alcohol. Many nonprescription cough and cold products contain sugar or alcohol. These can affect blood sugar. Pens should never  be shared. Even if the needle is changed, sharing may result in passing of viruses like hepatitis or HIV. Wear a medical ID bracelet or chain, and carry a card that describes your disease and details of your medicine and dosage times. What side effects may I notice from receiving this medicine? Side effects that you should report to your doctor or health care professional as soon as possible: -allergic reactions like skin rash, itching or hives, swelling of the face, lips, or tongue -breathing problems -changes in vision -diarrhea that continues or is severe -lump or swelling on the neck -severe nausea -signs and symptoms of infection like fever or chills; cough; sore throat; pain or trouble passing urine -signs and symptoms of low blood sugar such as feeling anxious, confusion, dizziness, increased hunger, unusually weak or tired, sweating, shakiness, cold, irritable, headache, blurred vision, fast heartbeat, loss of consciousness -signs and symptoms of kidney injury like trouble passing urine or change in the amount of urine -trouble  swallowing -unusual stomach upset or pain -vomiting Side effects that usually do not require medical attention (report to your doctor or health care professional if they continue or are bothersome): -constipation -diarrhea -nausea -pain, redness, or irritation at site where injected -stomach upset This list may not describe all possible side effects. Call your doctor for medical advice about side effects. You may report side effects to FDA at 1-800-FDA-1088. Where should I keep my medicine? Keep out of the reach of children. Store unopened pens in a refrigerator between 2 and 8 degrees C (36 and 46 degrees F). Do not freeze. Protect from light and heat. After you first use the pen, it can be stored for 56 days at room temperature between 15 and 30 degrees C (59 and 86 degrees F) or in a refrigerator. Throw away your used pen after 56 days or after the expiration date, whichever comes first. Do not store your pen with the needle attached. If the needle is left on, medicine may leak from the pen. NOTE: This sheet is a summary. It may not cover all possible information. If you have questions about this medicine, talk to your doctor, pharmacist, or health care provider.  2019 Elsevier/Gold Standard (2016-12-01 14:43:35)

## 2018-12-31 ENCOUNTER — Ambulatory Visit (INDEPENDENT_AMBULATORY_CARE_PROVIDER_SITE_OTHER): Payer: Medicare Other | Admitting: Nurse Practitioner

## 2018-12-31 ENCOUNTER — Other Ambulatory Visit: Payer: Self-pay

## 2018-12-31 ENCOUNTER — Encounter: Payer: Self-pay | Admitting: Nurse Practitioner

## 2018-12-31 VITALS — BP 121/77 | HR 84 | Temp 98.3°F | Ht 64.0 in | Wt 189.0 lb

## 2018-12-31 DIAGNOSIS — E1121 Type 2 diabetes mellitus with diabetic nephropathy: Secondary | ICD-10-CM

## 2018-12-31 MED ORDER — GLIPIZIDE 5 MG PO TABS: 2.5000 mg | ORAL_TABLET | Freq: Every day | ORAL | 5 refills | Status: DC

## 2018-12-31 NOTE — Assessment & Plan Note (Signed)
Chronic, ongoing.  A1C by [facility] NP was 11.  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  Will discontinue this and trial Glipizide, avoid Metformin d/t kidney function.  Continue Jardiance.  Patient to attend diabetic education classes.  To return in one week to assess BS with Glipizide use and determine dose change needs.

## 2018-12-31 NOTE — Patient Instructions (Signed)
Mindfulness-Based Stress Reduction Mindfulness-based stress reduction (MBSR) is a program that helps people learn to practice mindfulness. Mindfulness is the practice of intentionally paying attention to the present moment. It can be learned and practiced through techniques such as education, breathing exercises, meditation, and yoga. MBSR includes several mindfulness techniques in one program. MBSR works best when you understand the treatment, are willing to try new things, and can commit to spending time practicing what you learn. MBSR training may include learning about: How your emotions, thoughts, and reactions affect your body. New ways to respond to things that cause negative thoughts to start (triggers). How to notice your thoughts and let go of them. Practicing awareness of everyday things that you normally do without thinking. The techniques and goals of different types of meditation. What are the benefits of MBSR? MBSR can have many benefits, which include helping you to: Develop self-awareness. This refers to knowing and understanding yourself. Learn skills and attitudes that help you to participate in your own health care. Learn new ways to care for yourself. Be more accepting about how things are, and let things go. Be less judgmental and approach things with an open mind. Be patient with yourself and trust yourself more. MBSR has also been shown to: Reduce negative emotions, such as depression and anxiety. Improve memory and focus. Change how you sense and approach pain. Boost your body's ability to fight infections. Help you connect better with other people. Improve your sense of well-being. Follow these instructions at home:  Find a local in-person or online MBSR program. Set aside some time regularly for mindfulness practice. Find a mindfulness practice that works best for you. This may include one or more of the following: Meditation. Meditation involves focusing your  mind on a certain thought or activity. Breathing awareness exercises. These help you to stay present by focusing on your breath. Body scan. For this practice, you lie down and pay attention to each part of your body from head to toe. You can identify tension and soreness and intentionally relax parts of your body. Yoga. Yoga involves stretching and breathing, and it can improve your ability to move and be flexible. It can also provide an experience of testing your body's limits, which can help you release stress. Mindful eating. This way of eating involves focusing on the taste, texture, color, and smell of each bite of food. Because this slows down eating and helps you feel full sooner, it can be an important part of a weight-loss plan. Find a podcast or recording that provides guidance for breathing awareness, body scan, or meditation exercises. You can listen to these any time when you have a free moment to rest without distractions. Follow your treatment plan as told by your health care provider. This may include taking regular medicines and making changes to your diet or lifestyle as recommended. How to practice mindfulness To do a basic awareness exercise: Find a comfortable place to sit. Pay attention to the present moment. Observe your thoughts, feelings, and surroundings just as they are. Avoid placing judgment on yourself, your feelings, or your surroundings. Make note of any judgment that comes up, and let it go. Your mind may wander, and that is okay. Make note of when your thoughts drift, and return your attention to the present moment. To do basic mindfulness meditation: Find a comfortable place to sit. This may include a stable chair or a firm floor cushion. Sit upright with your back straight. Let your arms fall next  to your side with your hands resting on your legs. If sitting in a chair, rest your feet flat on the floor. If sitting on a cushion, cross your legs in front of  you. Keep your head in a neutral position with your chin dropped slightly. Relax your jaw and rest the tip of your tongue on the roof of your mouth. Drop your gaze to the floor. You can close your eyes if you like. Breathe normally and pay attention to your breath. Feel the air moving in and out of your nose. Feel your belly expanding and relaxing with each breath. Your mind may wander, and that is okay. Make note of when your thoughts drift, and return your attention to your breath. Avoid placing judgment on yourself, your feelings, or your surroundings. Make note of any judgment or feelings that come up, let them go, and bring your attention back to your breath. When you are ready, lift your gaze or open your eyes. Pay attention to how your body feels after the meditation. Where to find more information You can find more information about MBSR from: Your health care provider. Community-based meditation centers or programs. Programs offered near you. Summary Mindfulness-based stress reduction (MBSR) is a program that teaches you how to intentionally pay attention to the present moment. It is used with other treatments to help you cope better with daily stress, emotions, and pain. MBSR focuses on developing self-awareness, which allows you to respond to life stress without judgment or negative emotions. MBSR programs may involve learning different mindfulness practices, such as breathing exercises, meditation, yoga, body scan, or mindful eating. Find a mindfulness practice that works best for you, and set aside time for it on a regular basis. This information is not intended to replace advice given to you by your health care provider. Make sure you discuss any questions you have with your health care provider. Document Released: 03/23/2017 Document Revised: 03/23/2017 Document Reviewed: 03/23/2017 Elsevier Interactive Patient Education  2019 [employer]. Diabetes Mellitus and Nutrition,  Adult When you have diabetes (diabetes mellitus), it is very important to have healthy eating habits because your blood sugar (glucose) levels are greatly affected by what you eat and drink. Eating healthy foods in the appropriate amounts, at about the same times every day, can help you:  Control your blood glucose.  Lower your risk of heart disease.  Improve your blood pressure.  Reach or maintain a healthy weight. Every person with diabetes is different, and each person has different needs for a meal plan. Your health care provider may recommend that you work with a diet and nutrition specialist (dietitian) to make a meal plan that is best for you. Your meal plan may vary depending on factors such as:  The calories you need.  The medicines you take.  Your weight.  Your blood glucose, blood pressure, and cholesterol levels.  Your activity level.  Other health conditions you have, such as heart or kidney disease. How do carbohydrates affect me? Carbohydrates, also called carbs, affect your blood glucose level more than any other type of food. Eating carbs naturally raises the amount of glucose in your blood. Carb counting is a method for keeping track of how many carbs you eat. Counting carbs is important to keep your blood glucose at a healthy level, especially if you use insulin or take certain oral diabetes medicines. It is important to know how many carbs you can safely have in each meal. This is different for every  person. Your dietitian can help you calculate how many carbs you should have at each meal and for each snack. Foods that contain carbs include:  Bread, cereal, rice, pasta, and crackers.  Potatoes and corn.  Peas, beans, and lentils.  Milk and yogurt.  Fruit and juice.  Desserts, such as cakes, cookies, ice cream, and candy. How does alcohol affect me? Alcohol can cause a sudden decrease in blood glucose (hypoglycemia), especially if you use insulin or take  certain oral diabetes medicines. Hypoglycemia can be a life-threatening condition. Symptoms of hypoglycemia (sleepiness, dizziness, and confusion) are similar to symptoms of having too much alcohol. If your health care provider says that alcohol is safe for you, follow these guidelines:  Limit alcohol intake to no more than 1 drink per day for nonpregnant women and 2 drinks per day for men. One drink equals 12 oz of beer, 5 oz of wine, or 1 oz of hard liquor.  Do not drink on an empty stomach.  Keep yourself hydrated with water, diet soda, or unsweetened iced tea.  Keep in mind that regular soda, juice, and other mixers may contain a lot of sugar and must be counted as carbs. What are tips for following this plan?  Reading food labels  Start by checking the serving size on the "Nutrition Facts" label of packaged foods and drinks. The amount of calories, carbs, fats, and other nutrients listed on the label is based on one serving of the item. Many items contain more than one serving per package.  Check the total grams (g) of carbs in one serving. You can calculate the number of servings of carbs in one serving by dividing the total carbs by 15. For example, if a food has 30 g of total carbs, it would be equal to 2 servings of carbs.  Check the number of grams (g) of saturated and trans fats in one serving. Choose foods that have low or no amount of these fats.  Check the number of milligrams (mg) of salt (sodium) in one serving. Most people should limit total sodium intake to less than 2,300 mg per day.  Always check the nutrition information of foods labeled as "low-fat" or "nonfat". These foods may be higher in added sugar or refined carbs and should be avoided.  Talk to your dietitian to identify your daily goals for nutrients listed on the label. Shopping  Avoid buying canned, premade, or processed foods. These foods tend to be high in fat, sodium, and added sugar.  Shop around the  outside edge of the grocery store. This includes fresh fruits and vegetables, bulk grains, fresh meats, and fresh dairy. Cooking  Use low-heat cooking methods, such as baking, instead of high-heat cooking methods like deep frying.  Cook using healthy oils, such as olive, canola, or sunflower oil.  Avoid cooking with butter, cream, or high-fat meats. Meal planning  Eat meals and snacks regularly, preferably at the same times every day. Avoid going long periods of time without eating.  Eat foods high in fiber, such as fresh fruits, vegetables, beans, and whole grains. Talk to your dietitian about how many servings of carbs you can eat at each meal.  Eat 4-6 ounces (oz) of lean protein each day, such as lean meat, chicken, fish, eggs, or tofu. One oz of lean protein is equal to: ? 1 oz of meat, chicken, or fish. ? 1 egg. ?  cup of tofu.  Eat some foods each day that contain healthy fats,  such as avocado, nuts, seeds, and fish. Lifestyle  Check your blood glucose regularly.  Exercise regularly as told by your health care provider. This may include: ? 150 minutes of moderate-intensity or vigorous-intensity exercise each week. This could be brisk walking, biking, or water aerobics. ? Stretching and doing strength exercises, such as yoga or weightlifting, at least 2 times a week.  Take medicines as told by your health care provider.  Do not use any products that contain nicotine or tobacco, such as cigarettes and e-cigarettes. If you need help quitting, ask your health care provider.  Work with a Veterinary surgeon or diabetes educator to identify strategies to manage stress and any emotional and social challenges. Questions to ask a health care provider  Do I need to meet with a diabetes educator?  Do I need to meet with a dietitian?  What number can I call if I have questions?  When are the best times to check my blood glucose? Where to find more information:  American Diabetes  Association: diabetes.org  Academy of Nutrition and Dietetics: www.eatright.[employer] of Diabetes and Digestive and Kidney Diseases (NIH): CarFlippers.tn Summary  A healthy meal plan will help you control your blood glucose and maintain a healthy lifestyle.  Working with a diet and nutrition specialist (dietitian) can help you make a meal plan that is best for you.  Keep in mind that carbohydrates (carbs) and alcohol have immediate effects on your blood glucose levels. It is important to count carbs and to use alcohol carefully. This information is not intended to replace advice given to you by your health care provider. Make sure you discuss any questions you have with your health care provider. Document Released: 08/11/2005 Document Revised: 06/14/2017 Document Reviewed: 12/19/2016 Elsevier Interactive Patient Education  2019 [employer].

## 2018-12-31 NOTE — Progress Notes (Addendum)
BP 121/77   Pulse 84 Comment: apical  Temp 98.3 F (36.8 C) (Oral)   Ht 5\' 4"  (1.626 m)   Wt 189 lb (85.7 kg)   LMP  (LMP Unknown)   SpO2 93%   BMI 32.44 kg/m    Subjective:    Patient ID: Kim Thomas, female    DOB: 12-08-1955, 63 y.o.   MRN: [id number]  HPI: Kim Thomas is a 63 y.o. female  Chief Complaint  Patient presents with  . Diabetes    pt states that her blood sugar has been high lately   DIABETES Was recently started on Jardiance in the hospital and Ozempic was added on 12/17/2018.  Her last A1C was 7.0% 10/10/18.  [facility] came to visit on Saturday, A1C was performed and was 11.  She reports concern for gastroparesis, as pt reports difficulty passing bowels and belching increased since started Ozempic.  Her blood sugar have been 188 to 355 in the morning + evening 208 to 570.  She denies N&V.  States she has had a "desire to vomit", but not nausea or emesis.  Reports belching, states no burning or reflux.  She denies abdominal pain, postprandial fullness, bloating.  At baseline passes bowels without issue, but since starting Ozempic has had difficulty passing bowels.  States it is like she "contracts like a ball".  After couple bites of food it relaxes.  States she is not passing bowels as well, producing "very little" and what she does pass is "very hard".  Had been very regular prior to Ozempic, every day or other day.  [facility] if patient needed insulin, but she reports "I don't want that yet".   Hypoglycemic episodes:no Polydipsia/polyuria: no Visual disturbance: no Chest pain: no Paresthesias: no Glucose Monitoring: yes  Accucheck frequency: BID  Fasting glucose: 188 to 355  Post prandial:  Evening: 208 to 570  Before meals: Taking Insulin?: no  Long acting insulin:  Short acting insulin: Blood Pressure Monitoring: daily Retinal Examination: Up to Date Foot Exam: Up to Date Diabetic Education: ordered Pneumovax: Up  to Date Influenza: Up to Date Aspirin: no  Relevant past medical, surgical, family and social history reviewed and updated as indicated. Interim medical history since our last visit reviewed. Allergies and medications reviewed and updated.  Review of Systems  Constitutional: Negative for activity change, appetite change, diaphoresis, fatigue and fever.  Respiratory: Negative for cough, chest tightness and shortness of breath.   Cardiovascular: Negative for chest pain, palpitations and leg swelling.  Gastrointestinal: Negative for abdominal distention, abdominal pain, constipation, diarrhea, nausea and vomiting.  Endocrine: Negative for cold intolerance, heat intolerance, polydipsia, polyphagia and polyuria.  Neurological: Negative for dizziness, syncope, weakness, light-headedness, numbness and headaches.  Psychiatric/Behavioral: Negative.     Per HPI unless specifically indicated above     Objective:    BP 121/77   Pulse 84 Comment: apical  Temp 98.3 F (36.8 C) (Oral)   Ht 5\' 4"  (1.626 m)   Wt 189 lb (85.7 kg)   LMP  (LMP Unknown)   SpO2 93%   BMI 32.44 kg/m   Wt Readings from Last 3 Encounters:  12/31/18 189 lb (85.7 kg)  12/17/18 190 lb (86.2 kg)  12/13/18 192 lb (87.1 kg)    Physical Exam Vitals signs and nursing note reviewed.  Constitutional:      General: She is awake.     Appearance: She is well-developed.  HENT:  Head: Normocephalic.     Right Ear: Hearing normal.     Left Ear: Hearing normal.     Nose: Nose normal.     Mouth/Throat:     Mouth: Mucous membranes are moist.  Eyes:     General: Lids are normal.        Right eye: No discharge.        Left eye: No discharge.     Conjunctiva/sclera: Conjunctivae normal.     Pupils: Pupils are equal, round, and reactive to light.  Neck:     Musculoskeletal: Normal range of motion and neck supple.     Thyroid: No thyromegaly.     Vascular: No carotid bruit or JVD.  Cardiovascular:     Rate and Rhythm:  Normal rate and regular rhythm.     Heart sounds: Normal heart sounds. No murmur. No gallop.   Pulmonary:     Effort: Pulmonary effort is normal.     Breath sounds: Normal breath sounds.  Abdominal:     General: Bowel sounds are normal.     Palpations: Abdomen is soft. There is no hepatomegaly or splenomegaly.     Comments: Normoactive bowel sounds throughout with soft abdomen to palpation and no tenderness.  Musculoskeletal:     Right lower leg: No edema.     Left lower leg: No edema.  Lymphadenopathy:     Cervical: No cervical adenopathy.  Skin:    General: Skin is warm and dry.  Neurological:     Mental Status: She is alert and oriented to person, place, and time.  Psychiatric:        Attention and Perception: Attention normal.        Mood and Affect: Mood normal.        Behavior: Behavior normal. Behavior is cooperative.        Thought Content: Thought content normal.        Judgment: Judgment normal.     Results for orders placed or performed during the hospital encounter of 12/13/18  Glucose, capillary  Result Value Ref Range   Glucose-Capillary 594 (HH) 70 - 99 mg/dL   Comment 1 Notify RN   Basic metabolic panel  Result Value Ref Range   Sodium 126 (L) 135 - 145 mmol/L   Potassium 5.4 (H) 3.5 - 5.1 mmol/L   Chloride 93 (L) 98 - 111 mmol/L   CO2 21 (L) 22 - 32 mmol/L   Glucose, Bld 631 (HH) 70 - 99 mg/dL   BUN 30 (H) 8 - 23 mg/dL   Creatinine, Ser 1.611.39 (H) 0.44 - 1.00 mg/dL   Calcium 9.3 8.9 - 09.610.3 mg/dL   GFR calc non Af Amer 41 (L) >60 mL/min   GFR calc Af Amer 47 (L) >60 mL/min   Anion gap 12 5 - 15  CBC  Result Value Ref Range   WBC 5.7 4.0 - 10.5 K/uL   RBC 4.74 3.87 - 5.11 MIL/uL   Hemoglobin 14.0 12.0 - 15.0 g/dL   HCT 04.541.2 40.936.0 - 81.146.0 %   MCV 86.9 80.0 - 100.0 fL   MCH 29.5 26.0 - 34.0 pg   MCHC 34.0 30.0 - 36.0 g/dL   RDW 91.412.0 78.211.5 - 95.615.5 %   Platelets 214 150 - 400 K/uL   nRBC 0.0 0.0 - 0.2 %  Urinalysis, Complete w Microscopic  Result Value  Ref Range   Color, Urine YELLOW (A) YELLOW   APPearance CLEAR (A) CLEAR   Specific Gravity, Urine  1.027 1.005 - 1.030   pH 6.0 5.0 - 8.0   Glucose, UA >=500 (A) NEGATIVE mg/dL   Hgb urine dipstick NEGATIVE NEGATIVE   Bilirubin Urine NEGATIVE NEGATIVE   Ketones, ur 5 (A) NEGATIVE mg/dL   Protein, ur NEGATIVE NEGATIVE mg/dL   Nitrite NEGATIVE NEGATIVE   Leukocytes, UA NEGATIVE NEGATIVE   RBC / HPF 0-5 0 - 5 RBC/hpf   WBC, UA 0-5 0 - 5 WBC/hpf   Bacteria, UA NONE SEEN NONE SEEN   Squamous Epithelial / LPF 0-5 0 - 5   Mucus PRESENT   Glucose, capillary  Result Value Ref Range   Glucose-Capillary 490 (H) 70 - 99 mg/dL  Troponin I - Add-On to previous collection  Result Value Ref Range   Troponin I <0.03 <0.03 ng/mL  Glucose, capillary  Result Value Ref Range   Glucose-Capillary 305 (H) 70 - 99 mg/dL      Assessment & Plan:   Problem List Items Addressed This Visit      Endocrine   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  A1C by [facility] NP was 11.  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  Will discontinue this and trial Glipizide, avoid Metformin d/t kidney function.  Continue Jardiance.  Patient to attend diabetic education classes.  To return in one week to assess BS with Glipizide use and determine dose change needs.        Relevant Medications   glipiZIDE (GLUCOTROL) 5 MG tablet       Follow up plan: Return in about 1 week (around 01/07/2019) for T2DM.

## 2019-01-01 ENCOUNTER — Telehealth: Payer: Self-pay | Admitting: Nurse Practitioner

## 2019-01-01 NOTE — Telephone Encounter (Signed)
Spoke to Ms. Atha via telephone call this evening.  She reports improvement in symptoms.  Denies abdominal pain and reports no further nausea.  States she took Miralax this morning and no bowel movement yet.   Denies bloating.  Reports improvement in belching.  States her glucose this evening with change in medications was 82.  She is attending diabetes education on Thursday.  Reports "I am feeling a lot better".  Recommended to notify provider if any consistent BS <70.  She was able to verbalize back these instructions.  Thanked provider "for listening and helping me".  Provider will update [facility] NP in morning.

## 2019-01-02 ENCOUNTER — Telehealth: Payer: Self-pay | Admitting: Nurse Practitioner

## 2019-01-02 ENCOUNTER — Other Ambulatory Visit: Payer: Self-pay

## 2019-01-02 DIAGNOSIS — I10 Essential (primary) hypertension: Secondary | ICD-10-CM

## 2019-01-02 MED ORDER — GLUCOSE BLOOD VI STRP: ORAL_STRIP | 0 refills | Status: DC

## 2019-01-02 MED ORDER — TELMISARTAN 40 MG PO TABS: 40.0000 mg | ORAL_TABLET | Freq: Every day | ORAL | 3 refills | Status: DC

## 2019-01-02 NOTE — Telephone Encounter (Signed)
Attempt to call [facility] NP x 2 and messages left.  No return calls.  Spoke to Ms. Powell yesterday via telephone and she has had improvement in blood sugar readings with Glipizide + was having less nausea.

## 2019-01-02 NOTE — Telephone Encounter (Signed)
Refill request for Telmisartan approved.

## 2019-01-02 NOTE — Telephone Encounter (Signed)
[facility] faxed a Rx refill for Telmisartan tab. Per pt request

## 2019-01-02 NOTE — Telephone Encounter (Signed)
Copied from CRM [id number]. Topic: General - Other >> Jan 01, 2019 11:59 AM Percival Spanish wrote: Arline Asp NP with [facility] saw pt on 12/30/2018 and her A1C was 11 ,she said she also showing signs gasto paresis . She has been checking her on blood sugar and has been getting readings between 300 and 500      [phone]

## 2019-01-03 ENCOUNTER — Encounter: Payer: Self-pay | Admitting: Dietician

## 2019-01-03 ENCOUNTER — Encounter: Payer: Medicare Other | Attending: Nurse Practitioner | Admitting: Dietician

## 2019-01-03 VITALS — Ht 64.0 in | Wt 187.0 lb

## 2019-01-03 DIAGNOSIS — I10 Essential (primary) hypertension: Secondary | ICD-10-CM | POA: Insufficient documentation

## 2019-01-03 DIAGNOSIS — E785 Hyperlipidemia, unspecified: Secondary | ICD-10-CM | POA: Diagnosis not present

## 2019-01-03 DIAGNOSIS — M109 Gout, unspecified: Secondary | ICD-10-CM | POA: Insufficient documentation

## 2019-01-03 DIAGNOSIS — E1121 Type 2 diabetes mellitus with diabetic nephropathy: Secondary | ICD-10-CM | POA: Insufficient documentation

## 2019-01-03 NOTE — Patient Instructions (Signed)
Balance 3 meals per day spaced 4-5 hours apart. Balance meals with 1-3 oz protein (refer to list), 1-2 servings of carbohydrate (starch, fruit, yogurt) and non-starchy vegetables. Include at least 6-8 cups of water daily. Ok to add Splenda to coffee. Sodium- no more than 2000 mg per day. Continue to limit processed foods that have phosphorus additives. Ask MD about potassium supplement you are taking. Keep food records.

## 2019-01-03 NOTE — Progress Notes (Signed)
Medical Nutrition Therapy: Visit start time: 9:00am  end time: 10:10 Assessment:  Diagnosis:Type 2 Diabetes with diabetic nephropathy  Past medical history: hx of hyperlipidemia, hypertension, gout, sleep apnea Psychosocial issues/ stress concerns:  Patient rates her stress as "high" and indicates "ok" as to how well she is dealing with her stress.  Preferred learning method:  . Auditory . Visual . Hands-on  Current weight: 185 lbs    Height: 64 in Medications, supplements: see list  Progress and evaluation:  Patient in for initial medical nutrition assessment appointment. Her HgA1c in 11/'19 was 7 (on no diabetes meds) but increased to 11, 1/'20. Her GFR decreased from 58 to 47. Lipid panel was in normal ranges (on cholesterol medication). She did not tolerate Ozempic medication and was changed to Jardiance in addition to glipizide. Her blood sugars have improved from 200's-400's to FBG's the last 3 mornings of 160, 133, 102 and post meals of 166, 150 and 124. She reports that she is being very restrictive with her diet. She states she is eating mainly non-starchy vegetables at lunch and may add a 2-3 oz piece of baked chicken for dinner. Her breakfast is typically grits and toast or scrambled egg, [place] bacon, coffee and water. Her main beverage is water, drinking typically 8+ cups daily. She states "I'm afraid to eat any fruit or other foods besides chicken and  vegetables because my blood sugars will go up".  Most of her meals are prepared at home. She rarely eats "out". She keeps a package of snack crackers with her in case she has symptoms of low blood sugar. She reports that she adds very little salt to foods, eats very few processed foods, buys fresh vegetables, no canned. Her present diet based on information given is possibly low in protein. The limited variety she is eating is difficult to maintain over time.   Physical activity: "I haven't felt like exercising".    Nutrition  Care Education: Diabetes:   Instructed on a meal plan for diabetes including identifying carbohydrates, carbohydrate counting/portions and how to better balance carbohydrate, protein and non-starchy vegetables. Commended on diet changes patient has made to better control blood sugars but discouraged over- restriction. Gave and reviewed sample menus. Also discussed glucose goals for fasting blood sugars and post meal readings. Reviewed prevention and treatment of low blood sugars. Kidney Disease: Instructed on guidelines for kidney health including sodium, potassium, protein and phosphorus recommendations. She states that she is taking over the counter potassium supplements. Explained that although excessive protein is not healthy for kidneys, adequate protein is needed to maintain muscle and strength. Discouraged potassium supplement if not recommended by MD since her potassium level was slightly elevated 1/'19 and asked her to talk with MD regarding this.  Nutritional Diagnosis:  NB-1.1 Food and nutrition-related knowledge deficit As related to diet/nutrition for kidney health.  As evidenced by diet history.. NI-5.1 Increased nutrient needs (specify): protein As related to patient limiting her protein intake.  As evidenced by diet history..  Intervention: Balance 3 meals per day spaced 4-5 hours apart. Balance meals with 1-3 oz protein (refer to list), 1-2 servings of carbohydrate (starch, fruit, yogurt) and non-starchy vegetables. Include at least 6-8 cups of water daily. Ok to add Splenda to coffee. Sodium- no more than 2000 mg per day. Continue to limit processed foods that have phosphorus additives. Ask MD about potassium supplement you are taking. Keep food records.  Education Materials given:  . Plate Planner . Food lists/ Planning A  Balanced Meal . Sample meal pattern/ menus . Kidney Health handout . Goals/ instructions  Learner/ who was taught:  . Patient  Level of  understanding: . Partial understanding; needs review/ practice Demonstrated degree of understanding via:   Teach back Learning barriers: . None  Willingness to learn/ readiness for change: . Eager, change in progress  Monitoring and Evaluation:  Dietary intake, exercise,  and body weight      follow up: 01/31/2019 at 9:00am

## 2019-01-07 ENCOUNTER — Ambulatory Visit (INDEPENDENT_AMBULATORY_CARE_PROVIDER_SITE_OTHER): Payer: Medicare Other | Admitting: Nurse Practitioner

## 2019-01-07 ENCOUNTER — Encounter: Payer: Self-pay | Admitting: Nurse Practitioner

## 2019-01-07 ENCOUNTER — Other Ambulatory Visit: Payer: Self-pay

## 2019-01-07 VITALS — BP 115/71 | HR 92 | Temp 98.1°F | Ht 64.0 in | Wt 186.0 lb

## 2019-01-07 DIAGNOSIS — E1121 Type 2 diabetes mellitus with diabetic nephropathy: Secondary | ICD-10-CM

## 2019-01-07 LAB — BAYER DCA HB A1C WAIVED: HB A1C (BAYER DCA - WAIVED): 12.1 % — ABNORMAL HIGH (ref <7.0)

## 2019-01-07 NOTE — Progress Notes (Signed)
BP 115/71   Pulse 92   Temp 98.1 F (36.7 C) (Oral)   Ht 5\' 4"  (1.626 m)   Wt 186 lb (84.4 kg)   LMP  (LMP Unknown)   SpO2 99%   BMI 31.93 kg/m    Subjective:    Patient ID: Kim Thomas, female    DOB: Jul 27, 1956, 63 y.o.   MRN: [id number]  HPI: Kim Thomas is a 63 y.o. female  Chief Complaint  Patient presents with  . Diabetes    f/u   DIABETES She continues on Jardiance and was started on Glipizide at last visit d/t [facility] NP reporting that an in home A1C had been done and showed A1C 11 + pt continued to have elevations in FSBS at home.  Ozempic was discontinued at last visit d/t GI issues.  She reports improvement in FSBS with addition of Glipizide.  Is attending diabetic education at [facility].  She has also focused on weight loss and lost 3 pounds since her last visit.  She reports she is overall feeling "a whole lot better" and is "getting out house and exercising". Hypoglycemic episodes:no Polydipsia/polyuria: no Visual disturbance: no Chest pain: no Paresthesias: no Glucose Monitoring: yes  Accucheck frequency: BID  Fasting glucose: 102 -189 (higher level on day of start of Glipizide)  Post prandial:  Evening: 113 -166  Before meals: Taking Insulin?: no  Long acting insulin:  Short acting insulin: Blood Pressure Monitoring: daily Retinal Examination: Up to Date Foot Exam: Up to Date Diabetic Education: Completed Pneumovax: Up to Date Influenza: Up to Date Aspirin: no  Relevant past medical, surgical, family and social history reviewed and updated as indicated. Interim medical history since our last visit reviewed. Allergies and medications reviewed and updated.  Review of Systems  Constitutional: Negative for activity change, appetite change, diaphoresis, fatigue and fever.  Respiratory: Negative for cough, chest tightness and shortness of breath.   Cardiovascular: Negative for chest pain, palpitations and leg swelling.  Gastrointestinal: Negative for  abdominal distention, abdominal pain, constipation, diarrhea, nausea and vomiting.  Endocrine: Negative for cold intolerance, heat intolerance, polydipsia, polyphagia and polyuria.  Neurological: Negative for dizziness, syncope, weakness, light-headedness, numbness and headaches.  Psychiatric/Behavioral: Negative.     Per HPI unless specifically indicated above     Objective:    BP 115/71   Pulse 92   Temp 98.1 F (36.7 C) (Oral)   Ht 5\' 4"  (1.626 m)   Wt 186 lb (84.4 kg)   LMP  (LMP Unknown)   SpO2 99%   BMI 31.93 kg/m   Wt Readings from Last 3 Encounters:  01/07/19 186 lb (84.4 kg)  01/03/19 187 lb (84.8 kg)  12/31/18 189 lb (85.7 kg)    Physical Exam Vitals signs and nursing note reviewed.  Constitutional:      General: She is awake.     Appearance: She is well-developed.  HENT:     Head: Normocephalic.     Right Ear: Hearing normal.     Left Ear: Hearing normal.     Nose: Nose normal.     Mouth/Throat:     Mouth: Mucous membranes are moist.  Eyes:     General: Lids are normal.        Right eye: No discharge.        Left eye: No discharge.     Conjunctiva/sclera: Conjunctivae normal.     Pupils: Pupils are equal, round, and reactive to light.  Neck:     Musculoskeletal: Normal  range of motion and neck supple.     Thyroid: No thyromegaly.     Vascular: No carotid bruit or JVD.  Cardiovascular:     Rate and Rhythm: Normal rate and regular rhythm.     Heart sounds: Normal heart sounds. No murmur. No gallop.   Pulmonary:     Effort: Pulmonary effort is normal.     Breath sounds: Normal breath sounds.  Abdominal:     General: Bowel sounds are normal.     Palpations: Abdomen is soft. There is no hepatomegaly or splenomegaly.  Musculoskeletal:     Right lower leg: No edema.     Left lower leg: No edema.  Lymphadenopathy:     Cervical: No cervical adenopathy.  Skin:    General: Skin is warm and dry.  Neurological:     Mental Status: She is alert and  oriented to person, place, and time.  Psychiatric:        Attention and Perception: Attention normal.        Mood and Affect: Mood normal.        Behavior: Behavior normal. Behavior is cooperative.        Thought Content: Thought content normal.        Judgment: Judgment normal.     Results for orders placed or performed during the hospital encounter of 12/13/18  Glucose, capillary  Result Value Ref Range   Glucose-Capillary 594 (HH) 70 - 99 mg/dL   Comment 1 Notify RN   Basic metabolic panel  Result Value Ref Range   Sodium 126 (L) 135 - 145 mmol/L   Potassium 5.4 (H) 3.5 - 5.1 mmol/L   Chloride 93 (L) 98 - 111 mmol/L   CO2 21 (L) 22 - 32 mmol/L   Glucose, Bld 631 (HH) 70 - 99 mg/dL   BUN 30 (H) 8 - 23 mg/dL   Creatinine, Ser 4.541.39 (H) 0.44 - 1.00 mg/dL   Calcium 9.3 8.9 - 09.810.3 mg/dL   GFR calc non Af Amer 41 (L) >60 mL/min   GFR calc Af Amer 47 (L) >60 mL/min   Anion gap 12 5 - 15  CBC  Result Value Ref Range   WBC 5.7 4.0 - 10.5 K/uL   RBC 4.74 3.87 - 5.11 MIL/uL   Hemoglobin 14.0 12.0 - 15.0 g/dL   HCT 11.941.2 14.736.0 - 82.946.0 %   MCV 86.9 80.0 - 100.0 fL   MCH 29.5 26.0 - 34.0 pg   MCHC 34.0 30.0 - 36.0 g/dL   RDW 56.212.0 13.011.5 - 86.515.5 %   Platelets 214 150 - 400 K/uL   nRBC 0.0 0.0 - 0.2 %  Urinalysis, Complete w Microscopic  Result Value Ref Range   Color, Urine YELLOW (A) YELLOW   APPearance CLEAR (A) CLEAR   Specific Gravity, Urine 1.027 1.005 - 1.030   pH 6.0 5.0 - 8.0   Glucose, UA >=500 (A) NEGATIVE mg/dL   Hgb urine dipstick NEGATIVE NEGATIVE   Bilirubin Urine NEGATIVE NEGATIVE   Ketones, ur 5 (A) NEGATIVE mg/dL   Protein, ur NEGATIVE NEGATIVE mg/dL   Nitrite NEGATIVE NEGATIVE   Leukocytes, UA NEGATIVE NEGATIVE   RBC / HPF 0-5 0 - 5 RBC/hpf   WBC, UA 0-5 0 - 5 WBC/hpf   Bacteria, UA NONE SEEN NONE SEEN   Squamous Epithelial / LPF 0-5 0 - 5   Mucus PRESENT   Glucose, capillary  Result Value Ref Range   Glucose-Capillary 490 (H) 70 - 99 mg/dL  Troponin I -  Add-On to previous collection  Result Value Ref Range   Troponin I <0.03 <0.03 ng/mL  Glucose, capillary  Result Value Ref Range   Glucose-Capillary 305 (H) 70 - 99 mg/dL      Assessment & Plan:   Problem List Items Addressed This Visit      Endocrine   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  A1C today 12.1%, expected based on recent trend of glucose and [facility].  At this time her home sugars are trending downward significantly with Glipizide 2.5 MG daily and Jardiance.  She is also losing weight and reports overall feeling better, she would like to avoid insulin.  Will continue current medication regimen . She will notify me if morning FSBS trend above 120 and we will adjust Glipizide dose as needed.  BMP today to recheck K+ and NA+.  Return in 3 months for A1C.      Relevant Orders   Bayer DCA Hb A1c Waived   Basic Metabolic Panel (BMET)       Follow up plan: Return in about 3 months (around 04/07/2019) for T2DM, HTN/HLD.

## 2019-01-07 NOTE — Patient Instructions (Signed)
Diabetes Mellitus and Nutrition, Adult  When you have diabetes (diabetes mellitus), it is very important to have healthy eating habits because your blood sugar (glucose) levels are greatly affected by what you eat and drink. Eating healthy foods in the appropriate amounts, at about the same times every day, can help you:  · Control your blood glucose.  · Lower your risk of heart disease.  · Improve your blood pressure.  · Reach or maintain a healthy weight.  Every person with diabetes is different, and each person has different needs for a meal plan. Your health care provider may recommend that you work with a diet and nutrition specialist (dietitian) to make a meal plan that is best for you. Your meal plan may vary depending on factors such as:  · The calories you need.  · The medicines you take.  · Your weight.  · Your blood glucose, blood pressure, and cholesterol levels.  · Your activity level.  · Other health conditions you have, such as heart or kidney disease.  How do carbohydrates affect me?  Carbohydrates, also called carbs, affect your blood glucose level more than any other type of food. Eating carbs naturally raises the amount of glucose in your blood. Carb counting is a method for keeping track of how many carbs you eat. Counting carbs is important to keep your blood glucose at a healthy level, especially if you use insulin or take certain oral diabetes medicines.  It is important to know how many carbs you can safely have in each meal. This is different for every person. Your dietitian can help you calculate how many carbs you should have at each meal and for each snack.  Foods that contain carbs include:  · Bread, cereal, rice, pasta, and crackers.  · Potatoes and corn.  · Peas, beans, and lentils.  · Milk and yogurt.  · Fruit and juice.  · Desserts, such as cakes, cookies, ice cream, and candy.  How does alcohol affect me?  Alcohol can cause a sudden decrease in blood glucose (hypoglycemia),  especially if you use insulin or take certain oral diabetes medicines. Hypoglycemia can be a life-threatening condition. Symptoms of hypoglycemia (sleepiness, dizziness, and confusion) are similar to symptoms of having too much alcohol.  If your health care provider says that alcohol is safe for you, follow these guidelines:  · Limit alcohol intake to no more than 1 drink per day for nonpregnant women and 2 drinks per day for men. One drink equals 12 oz of beer, 5 oz of wine, or 1½ oz of hard liquor.  · Do not drink on an empty stomach.  · Keep yourself hydrated with water, diet soda, or unsweetened iced tea.  · Keep in mind that regular soda, juice, and other mixers may contain a lot of sugar and must be counted as carbs.  What are tips for following this plan?    Reading food labels  · Start by checking the serving size on the "Nutrition Facts" label of packaged foods and drinks. The amount of calories, carbs, fats, and other nutrients listed on the label is based on one serving of the item. Many items contain more than one serving per package.  · Check the total grams (g) of carbs in one serving. You can calculate the number of servings of carbs in one serving by dividing the total carbs by 15. For example, if a food has 30 g of total carbs, it would be equal to 2   servings of carbs.  · Check the number of grams (g) of saturated and trans fats in one serving. Choose foods that have low or no amount of these fats.  · Check the number of milligrams (mg) of salt (sodium) in one serving. Most people should limit total sodium intake to less than 2,300 mg per day.  · Always check the nutrition information of foods labeled as "low-fat" or "nonfat". These foods may be higher in added sugar or refined carbs and should be avoided.  · Talk to your dietitian to identify your daily goals for nutrients listed on the label.  Shopping  · Avoid buying canned, premade, or processed foods. These foods tend to be high in fat, sodium,  and added sugar.  · Shop around the outside edge of the grocery store. This includes fresh fruits and vegetables, bulk grains, fresh meats, and fresh dairy.  Cooking  · Use low-heat cooking methods, such as baking, instead of high-heat cooking methods like deep frying.  · Cook using healthy oils, such as olive, canola, or sunflower oil.  · Avoid cooking with butter, cream, or high-fat meats.  Meal planning  · Eat meals and snacks regularly, preferably at the same times every day. Avoid going long periods of time without eating.  · Eat foods high in fiber, such as fresh fruits, vegetables, beans, and whole grains. Talk to your dietitian about how many servings of carbs you can eat at each meal.  · Eat 4-6 ounces (oz) of lean protein each day, such as lean meat, chicken, fish, eggs, or tofu. One oz of lean protein is equal to:  ? 1 oz of meat, chicken, or fish.  ? 1 egg.  ? ¼ cup of tofu.  · Eat some foods each day that contain healthy fats, such as avocado, nuts, seeds, and fish.  Lifestyle  · Check your blood glucose regularly.  · Exercise regularly as told by your health care provider. This may include:  ? 150 minutes of moderate-intensity or vigorous-intensity exercise each week. This could be brisk walking, biking, or water aerobics.  ? Stretching and doing strength exercises, such as yoga or weightlifting, at least 2 times a week.  · Take medicines as told by your health care provider.  · Do not use any products that contain nicotine or tobacco, such as cigarettes and e-cigarettes. If you need help quitting, ask your health care provider.  · Work with a counselor or diabetes educator to identify strategies to manage stress and any emotional and social challenges.  Questions to ask a health care provider  · Do I need to meet with a diabetes educator?  · Do I need to meet with a dietitian?  · What number can I call if I have questions?  · When are the best times to check my blood glucose?  Where to find more  information:  · American Diabetes Association: diabetes.org  · Academy of Nutrition and Dietetics: www.eatright.org  · National Institute of Diabetes and Digestive and Kidney Diseases (NIH): www.niddk.nih.gov  Summary  · A healthy meal plan will help you control your blood glucose and maintain a healthy lifestyle.  · Working with a diet and nutrition specialist (dietitian) can help you make a meal plan that is best for you.  · Keep in mind that carbohydrates (carbs) and alcohol have immediate effects on your blood glucose levels. It is important to count carbs and to use alcohol carefully.  This information is not intended to   replace advice given to you by your health care provider. Make sure you discuss any questions you have with your health care provider.  Document Released: 08/11/2005 Document Revised: 06/14/2017 Document Reviewed: 12/19/2016  Elsevier Interactive Patient Education © 2019 Elsevier Inc.

## 2019-01-07 NOTE — Assessment & Plan Note (Signed)
Chronic, ongoing.  A1C today 12.1%, expected based on recent trend of glucose and recent report from [facility] NP of A1C 11.  At this time her home sugars are trending downward significantly with Glipizide 2.5 MG daily and Jardiance.  She is also losing weight and reports overall feeling better, she would like to avoid insulin.  Will continue current medication regimen . She will notify me if morning FSBS trend above 120 and we will adjust Glipizide dose as needed.  BMP today to recheck K+ and NA+.  Return in 3 months for A1C.

## 2019-01-08 LAB — BASIC METABOLIC PANEL
BUN/Creatinine Ratio: 10 — ABNORMAL LOW (ref 12–28)
BUN: 14 mg/dL (ref 8–27)
CO2: 18 mmol/L — ABNORMAL LOW (ref 20–29)
Calcium: 9.5 mg/dL (ref 8.7–10.3)
Chloride: 103 mmol/L (ref 96–106)
Creatinine, Ser: 1.39 mg/dL — ABNORMAL HIGH (ref 0.57–1.00)
GFR calc Af Amer: 47 mL/min/{1.73_m2} — ABNORMAL LOW (ref >59)
GFR calc non Af Amer: 41 mL/min/{1.73_m2} — ABNORMAL LOW (ref >59)
Glucose: 157 mg/dL — ABNORMAL HIGH (ref 65–99)
Potassium: 4.6 mmol/L (ref 3.5–5.2)
Sodium: 136 mmol/L (ref 134–144)

## 2019-01-15 ENCOUNTER — Other Ambulatory Visit: Payer: Self-pay | Admitting: Unknown Physician Specialty

## 2019-01-15 NOTE — Telephone Encounter (Signed)
Requested medication (s) are due for refill today -yes  Requested medication (s) are on the active medication list -yes  Future visit scheduled -yes  Last refill: 04/03/18  Notes to clinic: Patient is requesting refill of medication that passes protocol- can new PCP prescribe.  Requested Prescriptions  Pending Prescriptions Disp Refills   atorvastatin (LIPITOR) 20 MG tablet [Pharmacy Med Name: ATORVASTATIN  20MG   TAB] 90 tablet 3    Sig: TAKE 1 TABLET BY MOUTH  DAILY     Cardiovascular:  Antilipid - Statins Passed - 01/15/2019 10:17 AM      Passed - Total Cholesterol in normal range and within 360 days    Cholesterol, Total  Date Value Ref Range Status  10/10/2018 135 100 - 199 mg/dL Final   Cholesterol Piccolo, Waived  Date Value Ref Range Status  07/22/2016 153 <200 mg/dL Final    Comment:                            Desirable                <200                         Borderline High      200- 239                         High                     >239          Passed - LDL in normal range and within 360 days    LDL Calculated  Date Value Ref Range Status  10/10/2018 55 0 - 99 mg/dL Final         Passed - HDL in normal range and within 360 days    HDL  Date Value Ref Range Status  10/10/2018 58 >39 mg/dL Final         Passed - Triglycerides in normal range and within 360 days    Triglycerides  Date Value Ref Range Status  10/10/2018 111 0 - 149 mg/dL Final   Triglycerides Piccolo,Waived  Date Value Ref Range Status  07/22/2016 87 <150 mg/dL Final    Comment:                            Normal                   <150                         Borderline High     150 - 199                         High                200 - 499                         Very High                >499          Passed - Patient is not pregnant      Passed - Valid encounter within last 12 months  Recent Outpatient Visits          1 week ago Type 2 diabetes mellitus with diabetic  nephropathy, without long-term current use of insulin (HCC)   [facility] [place], [place] T, NP   2 weeks ago Type 2 diabetes mellitus with diabetic nephropathy, without long-term current use of insulin (HCC)   [facility] [place], Jolene T, NP   4 weeks ago Type 2 diabetes mellitus with diabetic nephropathy, without long-term current use of insulin (HCC)   [facility] [place], [employer] T, NP   3 months ago Type 2 diabetes mellitus without complication, without long-term current use of insulin (HCC)   [facility] [place], [place] T, NP   3 months ago Acute maxillary sinusitis, recurrence not specified   [facility] [place], Salley Hews, PA-C      Future Appointments            In 3 months Cannady, Dorie Rank, NP [facility]   In 3 months  [facility]            Requested Prescriptions  Pending Prescriptions Disp Refills   atorvastatin (LIPITOR) 20 MG tablet [Pharmacy Med Name: ATORVASTATIN  20MG   TAB] 90 tablet 3    Sig: TAKE 1 TABLET BY MOUTH  DAILY     Cardiovascular:  Antilipid - Statins Passed - 01/15/2019 10:17 AM      Passed - Total Cholesterol in normal range and within 360 days    Cholesterol, Total  Date Value Ref Range Status  10/10/2018 135 100 - 199 mg/dL Final   Cholesterol Piccolo, Waived  Date Value Ref Range Status  07/22/2016 153 <200 mg/dL Final    Comment:                            Desirable                <200                         Borderline High      200- 239                         High                     >239          Passed - LDL in normal range and within 360 days    LDL Calculated  Date Value Ref Range Status  10/10/2018 55 0 - 99 mg/dL Final         Passed - HDL in normal range and within 360 days    HDL  Date Value Ref Range Status  10/10/2018 58 >39 mg/dL Final         Passed - Triglycerides in normal range and within 360 days     Triglycerides  Date Value Ref Range Status  10/10/2018 111 0 - 149 mg/dL Final   Triglycerides Piccolo,Waived  Date Value Ref Range Status  07/22/2016 87 <150 mg/dL Final    Comment:                            Normal                   <150  Borderline High     150 - 199                         High                200 - 499                         Very High                >499          Passed - Patient is not pregnant      Passed - Valid encounter within last 12 months    Recent Outpatient Visits          1 week ago Type 2 diabetes mellitus with diabetic nephropathy, without long-term current use of insulin (HCC)   [facility] [place], Jolene T, NP   2 weeks ago Type 2 diabetes mellitus with diabetic nephropathy, without long-term current use of insulin (HCC)   [facility] [place], Jolene T, NP   4 weeks ago Type 2 diabetes mellitus with diabetic nephropathy, without long-term current use of insulin (HCC)   [facility] [place], [place], [place]   3 months ago Type 2 diabetes mellitus without complication, without long-term current use of insulin (HCC)   [facility] [place], [place] T, NP   3 months ago Acute maxillary sinusitis, recurrence not specified   [facility] [place], Salley Hews[place], PA-C      Future Appointments            In 3 months Cannady, Dorie RankJolene T, NP [facility]   In 3 months  [facility]

## 2019-01-25 ENCOUNTER — Ambulatory Visit: Payer: Medicare Other | Admitting: Nurse Practitioner

## 2019-01-28 ENCOUNTER — Other Ambulatory Visit: Payer: Self-pay

## 2019-01-28 MED ORDER — EMPAGLIFLOZIN 25 MG PO TABS: 25.0000 mg | ORAL_TABLET | Freq: Every day | ORAL | 1 refills | Status: DC

## 2019-01-28 NOTE — Telephone Encounter (Signed)
Fax from [employer]. Refill request for Jardiance. Last seen 01/07/2019

## 2019-01-30 ENCOUNTER — Other Ambulatory Visit: Payer: Self-pay | Admitting: *Deleted

## 2019-01-30 ENCOUNTER — Telehealth: Payer: Self-pay | Admitting: Nurse Practitioner

## 2019-01-30 MED ORDER — EMPAGLIFLOZIN 25 MG PO TABS: 25.0000 mg | ORAL_TABLET | Freq: Every day | ORAL | 1 refills | Status: DC

## 2019-01-30 NOTE — Telephone Encounter (Unsigned)
Copied from CRM [id number]. Topic: Quick Communication - Rx Refill/Question >> Jan 30, 2019 12:50 PM Wyonia Hough E wrote: Medication: empagliflozin (JARDIANCE) 25 MG TABS tablet - Rx didn't go thru/ class says print/ can call [phone] to verbal order   Has the patient contacted their pharmacy? Yes - Pharmacy didn't receive  Preferred Pharmacy (with phone number or street name): [facility] - [place], [place] - [address] [phone] (Phone) [phone] (Fax)    Agent: Please be advised that RX refills may take up to 3 business days. We ask that you follow-up with your pharmacy.

## 2019-01-30 NOTE — Progress Notes (Signed)
Changed Rx from print to normal- and resent it to pharmacy.

## 2019-01-30 NOTE — Telephone Encounter (Signed)
Rx resent for patient.  

## 2019-01-31 ENCOUNTER — Encounter: Payer: Self-pay | Admitting: Dietician

## 2019-01-31 ENCOUNTER — Encounter: Payer: Medicare Other | Attending: Nurse Practitioner | Admitting: Dietician

## 2019-01-31 VITALS — Ht 64.0 in | Wt 189.8 lb

## 2019-01-31 DIAGNOSIS — I10 Essential (primary) hypertension: Secondary | ICD-10-CM | POA: Insufficient documentation

## 2019-01-31 DIAGNOSIS — E785 Hyperlipidemia, unspecified: Secondary | ICD-10-CM | POA: Diagnosis not present

## 2019-01-31 DIAGNOSIS — M109 Gout, unspecified: Secondary | ICD-10-CM | POA: Insufficient documentation

## 2019-01-31 DIAGNOSIS — E1121 Type 2 diabetes mellitus with diabetic nephropathy: Secondary | ICD-10-CM | POA: Diagnosis present

## 2019-01-31 NOTE — Patient Instructions (Addendum)
Continue with previous goals: Balance 3 meals per day spaced 4-5 hours apart. Balance meals with 1-3 oz protein (refer to list), 1-2 servings of carbohydrate (starch, fruit, yogurt) and non-starchy vegetables. Include at least 6-8 cups of water daily. Ok to add Splenda to coffee. Sodium- no more than 2000 mg per day. Continue to limit processed foods that have phosphorus additives.  Also: Include at 5-6 oz protein food/ day. ( 1 egg = 1oz) or meat the size of a deck of card = 3 oz; 2 Tbsp peanut butter = 1 oz protein 1/2 cup chicken or tuna salad = 2 oz protein; 1/2 cup of pintos, black beans, northern beans = 1 oz protein.  Include [place] yogurt as a quick breakfast to include both protein and carbohydrate. Be sure to eat 15-30 minutes after taking glipizide to decrease risk of low blood glucose. Take fast acting glucose with you at all times.  Read labels for sodium content with goal of no more than 500-600 mg sodium per meal.

## 2019-01-31 NOTE — Progress Notes (Signed)
Medical Nutrition Therapy Follow-up visit:  Time with patient: 9:10am- 10:15 am Visit #:  2 ASSESSMENT:  Diagnosis:Type 2 Diabetes with diabetic nephropathy  Current weight: 189.8 lbs    Height: 64 in Medications: See list Medical History: hx of hyperlipidemia, hypertension, gout, sleep apnea  Progress and evaluation: Patient in for medical nutrition follow-up.  Her glucose records indicate FBG's in range of 110's-120's and most post meal readings are less than 150.  She has followed through to be less restrictive with her carbohydrate choices and amounts. She kept food records which indicate 3 meals per day. She includes a starch serving at most meal and includes 4-5 vegetable servings daily as well as 1-2 fruit servings daily. Her protein intake, based on food records is lower than recommended several days/week. She limits salt added in cooking and does not add salt at the table. She continues to limit processed foods and rarely eats meals "out". She followed through to ask her MD regarding taking potassium supplements and has discontinued these. Her weight today is 4.8 lbs higher than weight 1 month ago and food records kept for the month do not indicate excessive calories.  Physical activity:no structured exrercise   NUTRITION CARE EDUCATION:  Diabetes:   Reviewed food records with patient, commending on including more of a variety of healthy carbohydrates including fruit. Reviewed protein sources and discussed ways to increase but not exceed recommended amounts for kidney health. Reviewed balance of nutrients using food guide plate and food models.   Reviewed prevention and treatment of hypoglycemia.  Kidney Health: Reviewed dietary guidelines for kidney health. Reviewed recommendations for sodium intake as well as label reading.  INTERVENTION:  Continue with previous goals: Balance 3 meals per day spaced 4-5 hours apart. Balance meals with 1-3 oz protein (refer to list),  1-2 servings of carbohydrate (starch, fruit, yogurt) and non-starchy vegetables. Include at least 6-8 cups of water daily. Ok to add Splenda to coffee. Sodium- no more than 2000 mg per day. Continue to limit processed foods that have phosphorus additives.  Also: Include at 5-6 oz protein food/ day. ( 1 egg = 1oz) or meat the size of a deck of card = 3 oz; 2 Tbsp peanut butter = 1 oz protein 1/2 cup chicken or tuna salad = 2 oz protein; 1/2 cup of pintos, black beans, northern beans = 1 oz protein.  Include [place] yogurt as a quick breakfast to include both protein and carbohydrate rather than skipping. Be sure to eat 15-30 minutes after taking glipizide to decrease risk of low blood glucose. Take fast acting glucose with you at all times.  Read labels for sodium content with goal of no more than 500-600 mg sodium per meal.   EDUCATION MATERIALS GIVEN:  . Protein foods . Goals/ instructions  LEARNER/ who was taught:  . Patient   LEVEL OF UNDERSTANDING: . Verbalizes/ demonstrates competency Demonstrated degree of understanding via teach back.  LEARNING BARRIERS: . None  WILLINGNESS TO LEARN/READINESS FOR CHANGE: . Eager, change in progress  MONITORING AND EVALUATION:   No follow-up was scheduled. Patient was encouraged to call if desires further help regarding her diet/nutrition.

## 2019-02-12 ENCOUNTER — Other Ambulatory Visit: Payer: Self-pay | Admitting: Nurse Practitioner

## 2019-02-12 ENCOUNTER — Ambulatory Visit: Payer: Self-pay

## 2019-02-12 DIAGNOSIS — I10 Essential (primary) hypertension: Secondary | ICD-10-CM

## 2019-02-13 ENCOUNTER — Ambulatory Visit: Payer: Self-pay

## 2019-02-15 ENCOUNTER — Ambulatory Visit: Payer: Self-pay | Admitting: Pharmacist

## 2019-02-15 ENCOUNTER — Telehealth: Payer: Self-pay

## 2019-02-15 DIAGNOSIS — E1121 Type 2 diabetes mellitus with diabetic nephropathy: Secondary | ICD-10-CM

## 2019-02-15 NOTE — Chronic Care Management (AMB) (Signed)
  Chronic Care Management   Note  02/15/2019 Name: Kim Thomas MRN: [id number] DOB: 12-04-1955  Patient is a 62 year old female followed by Aura Dials, NP for primary care services, referred to chronic care management for support with diabetes, HTN, CKD, HF, RLS.   Patient had an initial visit scheduled with pharmacy this week, but she cancelled. Contacted patient to reschedule, but she declined to reschedule a physical visit. Explained that chronic care management services can be completed telephonically, however, she noted that she did not want to engage at this time. Will close referral. CCM team is happy to reopen the referral in the future if patient amenable.   Catie Feliz Beam, PharmD Clinical Pharmacist [facility] [phone]

## 2019-03-05 ENCOUNTER — Other Ambulatory Visit: Payer: Self-pay | Admitting: Nurse Practitioner

## 2019-04-01 ENCOUNTER — Telehealth: Payer: Self-pay | Admitting: Nurse Practitioner

## 2019-04-01 ENCOUNTER — Other Ambulatory Visit: Payer: Self-pay | Admitting: Nurse Practitioner

## 2019-04-01 MED ORDER — EMPAGLIFLOZIN 25 MG PO TABS: 25.0000 mg | ORAL_TABLET | Freq: Every day | ORAL | 2 refills | Status: DC

## 2019-04-01 NOTE — Progress Notes (Signed)
Jardiance refill sent.

## 2019-04-01 NOTE — Telephone Encounter (Signed)
Refill sent.

## 2019-04-01 NOTE — Telephone Encounter (Signed)
Called pt to let her know refill has been sent to pharmacy.

## 2019-04-01 NOTE — Telephone Encounter (Unsigned)
Copied from CRM [id number]. Topic: Quick Communication - Rx Refill/Question >> Apr 01, 2019  1:26 PM Gwenlyn Fudge A wrote: Medication: empagliflozin (JARDIANCE) 25 MG TABS tablet   Has the patient contacted their pharmacy? Yes.  Pt states pharmacy was supposed to call a week ago and failed to, so pt is down to 7 pills. Pt is also requesting a 3 month supply as it is cheaper. Please advise.  (Agent: If no, request that the patient contact the pharmacy for the refill.) (Agent: If yes, when and what did the pharmacy advise?)  Preferred Pharmacy (with phone number or street name):  [facility] - [place], [place] - [address] [place] [address] [place] [address] Phone: [phone] Fax: [phone] Not a 24 hour pharmacy; exact hours not known.   Agent: Please be advised that RX refills may take up to 3 business days. We ask that you follow-up with your pharmacy.

## 2019-04-16 LAB — HM DIABETES EYE EXAM

## 2019-05-09 ENCOUNTER — Ambulatory Visit: Payer: Medicare Other | Admitting: Nurse Practitioner

## 2019-05-09 ENCOUNTER — Ambulatory Visit: Payer: Medicare Other

## 2019-05-28 ENCOUNTER — Encounter: Payer: Self-pay | Admitting: Nurse Practitioner

## 2019-05-28 ENCOUNTER — Ambulatory Visit (INDEPENDENT_AMBULATORY_CARE_PROVIDER_SITE_OTHER): Payer: Medicare Other | Admitting: Nurse Practitioner

## 2019-05-28 ENCOUNTER — Other Ambulatory Visit: Payer: Self-pay

## 2019-05-28 VITALS — BP 132/81 | HR 84 | Temp 98.6°F

## 2019-05-28 DIAGNOSIS — E1121 Type 2 diabetes mellitus with diabetic nephropathy: Secondary | ICD-10-CM

## 2019-05-28 DIAGNOSIS — E1169 Type 2 diabetes mellitus with other specified complication: Secondary | ICD-10-CM

## 2019-05-28 DIAGNOSIS — N183 Chronic kidney disease, stage 3 unspecified: Secondary | ICD-10-CM

## 2019-05-28 DIAGNOSIS — I1310 Hypertensive heart and chronic kidney disease without heart failure, with stage 1 through stage 4 chronic kidney disease, or unspecified chronic kidney disease: Secondary | ICD-10-CM | POA: Diagnosis not present

## 2019-05-28 DIAGNOSIS — E785 Hyperlipidemia, unspecified: Secondary | ICD-10-CM

## 2019-05-28 LAB — LIPID PANEL PICCOLO, WAIVED
Chol/HDL Ratio Piccolo,Waive: 2.1 mg/dL
Cholesterol Piccolo, Waived: 150 mg/dL (ref <200)
HDL Chol Piccolo, Waived: 72 mg/dL (ref >59)
LDL Chol Calc Piccolo Waived: 60 mg/dL (ref <100)
Triglycerides Piccolo,Waived: 92 mg/dL (ref <150)
VLDL Chol Calc Piccolo,Waive: 18 mg/dL (ref <30)

## 2019-05-28 LAB — BAYER DCA HB A1C WAIVED: HB A1C (BAYER DCA - WAIVED): 6.1 % (ref <7.0)

## 2019-05-28 MED ORDER — TRIAMCINOLONE ACETONIDE 0.1 % EX CREA: 1.0000 "application " | TOPICAL_CREAM | Freq: Two times a day (BID) | CUTANEOUS | 2 refills | Status: DC

## 2019-05-28 NOTE — Assessment & Plan Note (Signed)
CMP today.  Continue Telmisartan and Jardiance.

## 2019-05-28 NOTE — Assessment & Plan Note (Signed)
Chronic, ongoing.  LDL 60 and TCHOL 150 today.  Continue current medication regimen and adjust as needed.

## 2019-05-28 NOTE — Patient Instructions (Signed)

## 2019-05-28 NOTE — Assessment & Plan Note (Signed)
Chronic, ongoing.  A1C went from 12.1 to today 6.1%.  Recommend discontinuing Glipizide at this time to help prevent low BS.  Continue Jardiance at this time with goal to someday d/c if possible.  Continue diet focus.  Patient wishes to trial taking Gabapentin down to once or twice a day, will try this.  Return in 3 months.

## 2019-05-28 NOTE — Progress Notes (Signed)
BP 132/81   Pulse 84   Temp 98.6 F (37 C) (Oral)   LMP  (LMP Unknown)   SpO2 98%    Subjective:    Patient ID: Kim Thomas, female    DOB: 11-02-1956, 63 y.o.   MRN: [id number]  HPI: Kim Thomas is a 63 y.o. female  Chief Complaint  Patient presents with  . Diabetes  . Hyperlipidemia  . Hypertension   DIABETES Continues on Glipizide 2.5 MG daily and Jardiance 25 MG daily.  Previously A1C 12.1, today 6.1%.  Her goal is to come completely off medication, she reports she has been focusing on diet more now since her education.  She inquired whether she can stop Jardiance, due to occasional low readings.  Discussed benefit of medication for heart and kidneys and discussed reduction to d/c of Glipizide instead to monitor BS without it. Hypoglycemic episodes:at times 79-80 she has symptoms, once or twice a week. Polydipsia/polyuria: no Visual disturbance: no Chest pain: no Paresthesias: no Glucose Monitoring: yes  Accucheck frequency: BID  Fasting glucose: 79 to 130  Post prandial:  Evening: depends on what she eats, has gone to 210  Before meals: Taking Insulin?: no  Long acting insulin:  Short acting insulin: Blood Pressure Monitoring: not checking Retinal Examination: Not up to Date Foot Exam: Up to Date Pneumovax: Up to Date Influenza: Up to Date Aspirin: no   HYPERTENSION / HYPERLIPIDEMIA Continues on Atorvastatin 20 MG daily and Telmisartan 40 MG.  She would like to come off of medications someday, which provider discussed at length with her today.  Her goal is to focus more on natural supplements someday. Satisfied with current treatment? yes Duration of hypertension: chronic BP monitoring frequency: not checking BP range:  BP medication side effects: no Duration of hyperlipidemia: chronic Cholesterol medication side effects: no Cholesterol supplements: none Medication compliance: good compliance Aspirin: no Recent stressors: no Recurrent headaches: no  Visual changes: no Palpitations: no Dyspnea: no Chest pain: no Lower extremity edema: no Dizzy/lightheaded: no  Relevant past medical, surgical, family and social history reviewed and updated as indicated. Interim medical history since our last visit reviewed. Allergies and medications reviewed and updated.  Review of Systems  Constitutional: Negative for activity change, appetite change, diaphoresis, fatigue and fever.  Respiratory: Negative for cough, chest tightness and shortness of breath.   Cardiovascular: Negative for chest pain, palpitations and leg swelling.  Gastrointestinal: Negative for abdominal distention, abdominal pain, constipation, diarrhea, nausea and vomiting.  Endocrine: Negative for cold intolerance, heat intolerance, polydipsia, polyphagia and polyuria.  Neurological: Negative for dizziness, syncope, weakness, light-headedness, numbness and headaches.  Psychiatric/Behavioral: Negative.     Per HPI unless specifically indicated above     Objective:    BP 132/81   Pulse 84   Temp 98.6 F (37 C) (Oral)   LMP  (LMP Unknown)   SpO2 98%   Wt Readings from Last 3 Encounters:  01/31/19 189 lb 12.8 oz (86.1 kg)  01/07/19 186 lb (84.4 kg)  01/03/19 187 lb (84.8 kg)    Physical Exam Vitals signs and nursing note reviewed.  Constitutional:      General: She is awake. She is not in acute distress.    Appearance: She is well-developed. She is not ill-appearing.  HENT:     Head: Normocephalic.     Right Ear: Hearing normal.     Left Ear: Hearing normal.     Nose: Nose normal.     Mouth/Throat:  Mouth: Mucous membranes are moist.  Eyes:     General: Lids are normal.        Right eye: No discharge.        Left eye: No discharge.     Conjunctiva/sclera: Conjunctivae normal.     Pupils: Pupils are equal, round, and reactive to light.  Neck:     Musculoskeletal: Normal range of motion and neck supple.     Thyroid: No thyromegaly.     Vascular: No carotid  bruit or JVD.  Cardiovascular:     Rate and Rhythm: Normal rate and regular rhythm.     Heart sounds: Normal heart sounds. No murmur. No gallop.   Pulmonary:     Effort: Pulmonary effort is normal. No accessory muscle usage or respiratory distress.     Breath sounds: Normal breath sounds.  Abdominal:     General: Bowel sounds are normal.     Palpations: Abdomen is soft. There is no hepatomegaly or splenomegaly.  Musculoskeletal:     Right lower leg: No edema.     Left lower leg: No edema.  Lymphadenopathy:     Cervical: No cervical adenopathy.  Skin:    General: Skin is warm and dry.  Neurological:     Mental Status: She is alert and oriented to person, place, and time.  Psychiatric:        Attention and Perception: Attention normal.        Mood and Affect: Mood normal.        Behavior: Behavior normal. Behavior is cooperative.        Thought Content: Thought content normal.        Judgment: Judgment normal.     Results for orders placed or performed in visit on 05/28/19  Bayer DCA Hb A1c Waived  Result Value Ref Range   HB A1C (BAYER DCA - WAIVED) 6.1 <7.0 %  Lipid Panel Piccolo, Waived  Result Value Ref Range   Cholesterol Piccolo, Waived 150 <200 mg/dL   HDL Chol Piccolo, Waived 72 >59 mg/dL   Triglycerides Piccolo,Waived 92 <150 mg/dL   Chol/HDL Ratio Piccolo,Waive 2.1 mg/dL   LDL Chol Calc Piccolo Waived 60 <100 mg/dL   VLDL Chol Calc Piccolo,Waive 18 <30 mg/dL      Assessment & Plan:   Problem List Items Addressed This Visit      Cardiovascular and Mediastinum   Hypertensive heart/kidney disease without HF and with CKD stage III (HCC)    Chronic, ongoing.  At goal with BP today and recommend she check BP at home three mornings a week and document for provider.  Continue current medication regimen.  CMP today.  Educated her on Telmisartan and kidney benefit.        Endocrine   Type 2 diabetes mellitus with diabetic nephropathy (HCC) - Primary    Chronic,  ongoing.  A1C went from 12.1 to today 6.1%.  Recommend discontinuing Glipizide at this time to help prevent low BS.  Continue Jardiance at this time with goal to someday d/c if possible.  Continue diet focus.  Patient wishes to trial taking Gabapentin down to once or twice a day, will try this.  Return in 3 months.      Relevant Orders   Bayer DCA Hb A1c Waived (Completed)   Lipid Panel Piccolo, Waived (Completed)   Comprehensive metabolic panel   Hyperlipidemia associated with type 2 diabetes mellitus (HCC)    Chronic, ongoing.  LDL 60 and TCHOL 150 today.  Continue  current medication regimen and adjust as needed.        Genitourinary   CKD (chronic kidney disease), stage III (HCC)    CMP today.  Continue Telmisartan and Jardiance.         Time: 25 minutes, >50% spent counseling on medication and chronic diseases   Follow up plan: Return in about 3 months (around 08/28/2019) for T2DM, HTN/HLD.

## 2019-05-28 NOTE — Assessment & Plan Note (Signed)
Chronic, ongoing.  At goal with BP today and recommend she check BP at home three mornings a week and document for provider.  Continue current medication regimen.  CMP today.  Educated her on Telmisartan and kidney benefit.

## 2019-05-29 LAB — COMPREHENSIVE METABOLIC PANEL
ALT: 16 IU/L (ref 0–32)
AST: 25 IU/L (ref 0–40)
Albumin/Globulin Ratio: 1.3 (ref 1.2–2.2)
Albumin: 4.5 g/dL (ref 3.8–4.8)
Alkaline Phosphatase: 105 IU/L (ref 39–117)
BUN/Creatinine Ratio: 12 (ref 12–28)
BUN: 14 mg/dL (ref 8–27)
Bilirubin Total: 0.3 mg/dL (ref 0.0–1.2)
CO2: 15 mmol/L — ABNORMAL LOW (ref 20–29)
Calcium: 9.6 mg/dL (ref 8.7–10.3)
Chloride: 103 mmol/L (ref 96–106)
Creatinine, Ser: 1.16 mg/dL — ABNORMAL HIGH (ref 0.57–1.00)
GFR calc Af Amer: 58 mL/min/{1.73_m2} — ABNORMAL LOW (ref >59)
GFR calc non Af Amer: 50 mL/min/{1.73_m2} — ABNORMAL LOW (ref >59)
Globulin, Total: 3.5 g/dL (ref 1.5–4.5)
Glucose: 90 mg/dL (ref 65–99)
Potassium: 5.2 mmol/L (ref 3.5–5.2)
Sodium: 142 mmol/L (ref 134–144)
Total Protein: 8 g/dL (ref 6.0–8.5)

## 2019-06-10 ENCOUNTER — Telehealth: Payer: Self-pay

## 2019-06-10 MED ORDER — ONETOUCH ULTRA MINI W/DEVICE KIT: PACK | 2 refills | Status: DC

## 2019-06-12 ENCOUNTER — Other Ambulatory Visit: Payer: Self-pay | Admitting: Nurse Practitioner

## 2019-06-12 DIAGNOSIS — I10 Essential (primary) hypertension: Secondary | ICD-10-CM

## 2019-06-12 MED ORDER — ONETOUCH ULTRA MINI W/DEVICE KIT: PACK | 2 refills | Status: DC

## 2019-06-12 NOTE — Telephone Encounter (Signed)
Pt stated she spoke with [facility] and they did not receive rx for Blood Glucose Monitoring Suppl (ONE TOUCH ULTRA MINI) w/Device KIT sent on 06/10/19. Requesting it be resent as soon as possible. Please advise.  [place], [place] [employer] [phone] (Phone) [phone] (Fax)

## 2019-06-12 NOTE — Telephone Encounter (Signed)
Re-sent script to [facility]. 

## 2019-06-12 NOTE — Telephone Encounter (Signed)
Called pt and told her that prescription has been resent per Jolene. Pt understood

## 2019-06-18 NOTE — Telephone Encounter (Signed)
They no longer have the Ultra mini, it is now the ultra 2. They are getting it ready and will send it out ASAP. Pt aware.

## 2019-06-18 NOTE — Telephone Encounter (Signed)
Thank you!!  You are awesome.

## 2019-06-18 NOTE — Telephone Encounter (Signed)
Can you check on this with [facility] for me.  I have sent two times and they are saying they have not received.  Could you see if the One touch ultra mini with device kit is still available and how Ican go about ensuring they receive script.  Thank you!!

## 2019-06-18 NOTE — Telephone Encounter (Signed)
Patient called to check the status of her medication.  The pharmacy is saying that they still have not received the order.  It might be possible that it was discontinued.  Please let patient know and send an alternative that her insurance will cover.  The fax # to pharmacy is [phone], Phone # [phone].  Please call patient at [phone]

## 2019-08-14 ENCOUNTER — Other Ambulatory Visit: Payer: Self-pay

## 2019-08-14 ENCOUNTER — Ambulatory Visit (INDEPENDENT_AMBULATORY_CARE_PROVIDER_SITE_OTHER): Payer: Medicare Other

## 2019-08-14 VITALS — BP 112/78 | HR 87 | Temp 97.6°F | Ht 64.5 in | Wt 204.0 lb

## 2019-08-14 DIAGNOSIS — Z1239 Encounter for other screening for malignant neoplasm of breast: Secondary | ICD-10-CM | POA: Diagnosis not present

## 2019-08-14 DIAGNOSIS — E1121 Type 2 diabetes mellitus with diabetic nephropathy: Secondary | ICD-10-CM | POA: Diagnosis not present

## 2019-08-14 DIAGNOSIS — M8580 Other specified disorders of bone density and structure, unspecified site: Secondary | ICD-10-CM

## 2019-08-14 DIAGNOSIS — Z0000 Encounter for general adult medical examination without abnormal findings: Secondary | ICD-10-CM

## 2019-08-14 NOTE — Progress Notes (Signed)
Subjective:   Kim Thomas is a 63 y.o. female who presents for Medicare Annual (Subsequent) preventive examination.  Review of Systems:  Cardiac Risk Factors include: advanced age (>48mn, >>56women);diabetes mellitus;dyslipidemia;hypertension;obesity (BMI >30kg/m2)     Objective:     Vitals: BP 112/78 (BP Location: Right Arm, Patient Position: Sitting, Cuff Size: Normal)   Pulse 87   Temp 97.6 F (36.4 C) (Temporal)   Ht 5' 4.5" (1.638 m)   Wt 204 lb (92.5 kg)   LMP  (LMP Unknown)   SpO2 97%   BMI 34.48 kg/m   Body mass index is 34.48 kg/m.  Advanced Directives 08/14/2019 01/03/2019 12/13/2018 09/13/2017 05/24/2017 02/20/2017 07/18/2016  Does Patient Have a Medical Advance Directive? Yes Yes Yes Yes No No No  Type of Advance Directive Living will;Healthcare Power of [place] will - - -  Does patient want to make changes to medical advance directive? - - No - Patient declined - - - -  Copy of [place] Chart? No - copy requested - No - copy requested - - - -  Would patient like information on creating a medical advance directive? - - - - - - -    Tobacco Social History   Tobacco Use  Smoking Status Former Smoker  . Packs/day: 0.25  . Quit date: 05/21/2011  . Years since quitting: 8.2  Smokeless Tobacco Never Used     Counseling given: Not Answered   Clinical Intake:  Pre-visit preparation completed: Yes  Pain : No/denies pain     Nutritional Status: BMI > 30  Obese Nutritional Risks: None Diabetes: Yes CBG done?: No Did pt. bring in CBG monitor from home?: No  How often do you need to have someone help you when you read instructions, pamphlets, or other written materials from your doctor or pharmacy?: 1 - Never  Nutrition Risk Assessment:  Has the patient had any N/V/D within the last 2 months?  No  Does the patient have  any non-healing wounds?  No  Has the patient had any unintentional weight loss or weight gain?  No   Diabetes:  Is the patient diabetic?  Yes  If diabetic, was a CBG obtained today?  No  Did the patient bring in their glucometer from home?  No  How often do you monitor your CBG's? Twice a week or if not feeling well 2-3 times a day  Financial Strains and Diabetes Management:  Are you having any financial strains with the device, your supplies or your medication? No .  Does the patient want to be seen by Chronic Care Management for management of their diabetes?  Yes  Would the patient like to be referred to a Nutritionist or for Diabetic Management?  Yes   Diabetic Exams:  Diabetic Eye Exam: Overdue for diabetic eye exam. Pt has been advised about the importance in completing this exam.[facility]  Diabetic Foot Exam: Completed 05/04/2018. Pt has been advised about the importance in completing this exam.   Interpreter Needed?: No  Information entered by :: Mikayah Joy,LPN  Past Medical History:  Diagnosis Date  . Anxiety   . Chronic kidney disease   . DDD (degenerative disc disease), cervical   . Diabetes mellitus without complication ([place]   . Gout   . Hypertension   . Sleep apnea    Past Surgical History:  Procedure Laterality Date  . BREAST BIOPSY Left    benign  . BREAST CYST EXCISION Left   . BREAST EXCISIONAL BIOPSY Left   . c section     x2  . CHOLECYSTECTOMY    . EYE SURGERY Bilateral 2019   catracts Dr.Hecter in [place]   . FOOT SURGERY Bilateral   . KNEE ARTHROSCOPY Left 07/18/2016   Procedure: ARTHROSCOPY KNEE;  Surgeon: Dereck Leep, MD;  Location: [facility] ORS;  Service: Orthopedics;  Laterality: Left;  . KNEE ARTHROSCOPY WITH LATERAL MENISECTOMY Left 07/18/2016   Procedure: KNEE ARTHROSCOPY WITH PARTIAL  LATERAL MENISECTOMY, CHONDROPLASTY LATERAL FEMORAL CONDYLE;  Surgeon: Dereck Leep, MD;  Location: [facility]  ORS;  Service: Orthopedics;  Laterality: Left;  . SPINE SURGERY  11/09/11   C5  . TUBAL LIGATION     Family History  Problem Relation Age of Onset  . Cancer Mother        breast  . Hypertension Mother   . Hyperlipidemia Mother   . Diabetes Mother   . Glaucoma Mother   . Cataracts Mother   . Breast cancer Mother   . Cancer Father   . Cancer Maternal Grandfather   . Breast cancer Maternal Aunt   . Breast cancer Cousin   . Alcohol abuse Neg Hx    Social History   Socioeconomic History  . Marital status: Single    Spouse name: Not on file  . Number of children: Not on file  . Years of education: Not on file  . Highest education level: Bachelor's degree (e.g., BA, AB, BS)  Occupational History  . Occupation: retired  Scientific laboratory technician  . Financial resource strain: Somewhat hard  . Food insecurity    Worry: Never true    Inability: Never true  . Transportation needs    Medical: No    Non-medical: No  Tobacco Use  . Smoking status: Former Smoker    Packs/day: 0.25    Quit date: 05/21/2011    Years since quitting: 8.2  . Smokeless tobacco: Never Used  Substance and Sexual Activity  . Alcohol use: No    Alcohol/week: 0.0 standard drinks  . Drug use: No  . Sexual activity: Not Currently  Lifestyle  . Physical activity    Days per week: 3 days    Minutes per session: 20 min  . Stress: Not at all  Relationships  . Social connections    Talks on phone: More than three times a week    Gets together: More than three times a week    Attends religious service: More than 4 times per year    Active member of club or organization: No    Attends meetings of clubs or organizations: Never    Relationship status: Not on file  Other Topics Concern  . Not on file  Social History Narrative  . Not on file    Outpatient Encounter Medications as of 08/14/2019  Medication Sig  . acetaminophen (TYLENOL) 500 MG tablet Take 500 mg by mouth daily as needed.  [name] atorvastatin (LIPITOR) 20  MG tablet TAKE 1 TABLET BY MOUTH  DAILY  . BLACK CURRANT SEED OIL PO Take 1,250 mg by mouth 2 (two) times daily.  . Blood Glucose Monitoring Suppl (ONE TOUCH ULTRA MINI) w/Device KIT As directed  . cetirizine (ZYRTEC) 10 MG tablet Take 1 tablet (10 mg total) by mouth daily.  . Cholecalciferol (VITAMIN D3) 2000 units TABS Take 2,000 Units by mouth daily.  [name]  cyclobenzaprine (FLEXERIL) 10 MG tablet Take 10 mg by mouth at bedtime. Take 1/2 tablet to 1 as needed  At bed time  . diclofenac sodium (VOLTAREN) 1 % GEL Apply topically.  . empagliflozin (JARDIANCE) 25 MG TABS tablet Take 25 mg by mouth daily.  . fluticasone (FLONASE) 50 MCG/ACT nasal spray Place 1-2 sprays into both nostrils 2 (two) times daily.  [name] gabapentin (NEURONTIN) 400 MG capsule Take 1 capsule by mouth 3 (three) times daily.  [name] glipiZIDE (GLUCOTROL) 5 MG tablet Take 0.5 tablets (2.5 mg total) by mouth daily before breakfast.  . Glucosamine-Chondroit-Vit C-Mn (GLUCOSAMINE CHONDR 1500 COMPLX PO) Take by mouth.  [name] glucose blood (ONE TOUCH ULTRA TEST) test strip USE TO CHECK BLOOD SUGAR  DAILY  . Magnesium 250 MG TABS Take 250 mg by mouth daily.  . Multiple Vitamin (MULTIVITAMIN) tablet Take 1 tablet by mouth daily.  . Nutritional Supplements (IMMUNE ENHANCE PO) Take by mouth.  Glory Rosebush DELICA LANCETS FINE MISC USE TO CHECK BLOOD SUGAR  DAILY  . Potassium 99 MG TABS Take by mouth.  . pyridOXINE (VITAMIN B-6) 100 MG tablet Take 100 mg by mouth daily.  [name] telmisartan (MICARDIS) 40 MG tablet Take 1 tablet by mouth once daily  . triamcinolone cream (KENALOG) 0.1 % Apply 1 application topically 2 (two) times daily.  [name] UNABLE TO FIND Cherry Juice- pt states she takes 1 tsp daily at bedtime  . vitamin B-12 (CYANOCOBALAMIN) 500 MCG tablet Take 500 mcg by mouth daily.  [name] CINNAMON PO Take 2 capsules daily by mouth.  . ondansetron (ZOFRAN) 4 MG tablet Take 1 tablet (4 mg total) by mouth every 8 (eight) hours as needed for nausea or vomiting.  (Patient not taking: Reported on 08/14/2019)   No facility-administered encounter medications on file as of 08/14/2019.     Activities of Daily Living In your present state of health, do you have any difficulty performing the following activities: 08/14/2019  Hearing? N  Comment no hearing aids  Vision? N  Comment no eyeglasses, goes to [facility] for yearly eye exams.  Difficulty concentrating or making decisions? N  Walking or climbing stairs? Y  Dressing or bathing? N  Doing errands, shopping? N  Preparing Food and eating ? N  Using the Toilet? N  In the past six months, have you accidently leaked urine? N  Do you have problems with loss of bowel control? N  Managing your Medications? N  Managing your Finances? N  Housekeeping or managing your Housekeeping? N  Some recent data might be hidden    Patient Care Team: Venita Lick, NP as PCP - General (Nurse Practitioner) Anabel Bene, MD as Referring Physician (Neurology) Watt Climes, PA as Physician Assistant (Orthopedic Surgery) Dereck Leep, MD (Orthopedic Surgery)    Assessment:   This is a routine wellness examination for [facility].  Exercise Activities and Dietary recommendations Current Exercise Habits: Home exercise routine, Time (Minutes): 15, Frequency (Times/Week): 3, Weekly Exercise (Minutes/Week): 45, Intensity: Mild, Exercise limited by: None identified  Goals    . DIET - INCREASE WATER INTAKE     Recommend drinking at least 6-8 glasses of water a day     . Increase water intake     Recommend drinking at least 4-5 glasses a day       Fall Risk: Fall Risk  08/14/2019 01/31/2019 01/03/2019 05/04/2018 10/02/2017  Falls in the past year? 0 (No Data) 0 Yes Yes  Comment - no falls since  previous visit - - -  Number falls in past yr: - - - 2 or more 2 or more  Comment - - - - 5  Injury with Fall? - - - Yes Yes  Risk Factor Category  - - - High Fall Risk -  Follow up - - - Falls prevention discussed -     FALL RISK PREVENTION PERTAINING TO THE HOME:  Any stairs in or around the home? Yes  If so, are there any without handrails? No   Home free of loose throw rugs in walkways, pet beds, electrical cords, etc? Yes  Adequate lighting in your home to reduce risk of falls? Yes   ASSISTIVE DEVICES UTILIZED TO PREVENT FALLS:  Life alert? Yes  Use of a cane, walker or w/c? Yes  cane and walker as needed Grab bars in the bathroom? Yes  Shower chair or bench in shower? Yes  Elevated toilet seat or a handicapped toilet? Yes   TIMED UP AND GO:  Was the test performed? Yes .  Length of time to ambulate 10 feet: 11 sec.   GAIT:  Appearance of gait: Gait steady and fast without the use of an assistive device.  Education: Fall risk prevention has been discussed.  Intervention(s) required? No   DME/home health order needed?  No    Depression Screen PHQ 2/9 Scores 08/14/2019 01/03/2019 05/04/2018 04/03/2018  PHQ - 2 Score 0 0 0 0  PHQ- 9 Score - - - 1     Cognitive Function     6CIT Screen 05/04/2018 06/02/2017  What Year? 0 points 0 points  What month? 0 points 0 points  What time? 0 points 0 points  Count back from 20 0 points 0 points  Months in reverse 0 points 0 points  Repeat phrase 0 points 0 points  Total Score 0 0    Immunization History  Administered Date(s) Administered  . Td 04/22/2015  . Zoster Recombinat (Shingrix) 02/23/2017, 06/16/2017    Qualifies for Shingles Vaccine? Shingrix completed   Tdap: up to date   Flu Vaccine: Due for Flu vaccine. Does the patient want to receive this vaccine today?  No . Education has been provided regarding the importance of this vaccine but still declined. Advised may receive this vaccine at local pharmacy or Health Dept. Aware to provide a copy of the vaccination record if obtained from local pharmacy or Health Dept. Verbalized acceptance and understanding.  Pneumococcal Vaccine: Due for Pneumococcal vaccine. Does the patient want to  receive this vaccine today?  No . Education has been provided regarding the importance of this vaccine but still declined. Advised may receive this vaccine at local pharmacy or Health Dept. Aware to provide a copy of the vaccination record if obtained from local pharmacy or Health Dept. Verbalized acceptance and understanding.   Screening Tests Health Maintenance  Topic Date Due  . HIV Screening  01/14/1971  . OPHTHALMOLOGY EXAM  02/28/2019  . MAMMOGRAM  03/09/2019  . FOOT EXAM  05/05/2019  . INFLUENZA VACCINE  02/26/2020 (Originally 06/29/2019)  . PNEUMOCOCCAL POLYSACCHARIDE VACCINE AGE 6-64 HIGH RISK  08/13/2020 (Originally 01/14/1958)  . HEMOGLOBIN A1C  11/27/2019  . PAP SMEAR-Modifier  02/21/2020  . COLONOSCOPY  11/29/2023  . TETANUS/TDAP  04/21/2025  . Hepatitis C Screening  Completed    Cancer Screenings:  Colorectal Screening: Completed 11/28/2013. Repeat every 10 years  Mammogram: Completed 02/14/2018. Repeat every year  Bone Density: not indicated  Lung Cancer Screening: (Low Dose CT  Chest recommended if Age 20-80 years, 30 pack-year currently smoking OR have quit w/in 15years.) does not qualify.   Additional Screening:  Hepatitis C Screening: does qualify; Completed 06/30/2017  Dental Screening: Recommended annual dental exams for proper oral hygiene   Community Resource Referral:  CRR required this visit?  No       Plan:  I have personally reviewed and addressed the Medicare Annual Wellness questionnaire and have noted the following in the patient's chart:  A. Medical and social history B. Use of alcohol, tobacco or illicit drugs  C. Current medications and supplements D. Functional ability and status E.  Nutritional status F.  Physical activity G. Advance directives H. List of other physicians I.  Hospitalizations, surgeries, and ER visits in previous 12 months J.  [place] such as hearing and vision if needed, cognitive and depression L. Referrals  and appointments   In addition, I have reviewed and discussed with patient certain preventive protocols, quality metrics, and best practice recommendations. A written personalized care plan for preventive services as well as general preventive health recommendations were provided to patient.   Signed,    Bevelyn Ngo, LPN  [id number] Nurse Health Advisor   Nurse Notes: none

## 2019-08-14 NOTE — Patient Instructions (Signed)
Kim Thomas , Thank you for taking time to come for your Medicare Wellness Visit. I appreciate your ongoing commitment to your health goals. Please review the following plan we discussed and let me know if I can assist you in the future.   Screening recommendations/referrals: Colonoscopy: completed 2015 Mammogram: Please call [phone] to schedule your mammogram.  Bone Density: please schedule  Recommended yearly ophthalmology/optometry visit for glaucoma screening and checkup Recommended yearly dental visit for hygiene and checkup  Vaccinations: Influenza vaccine: declined Pneumococcal vaccine: declined Tdap vaccine: up to date Shingles vaccine: up to date    Advanced directives: Please bring a copy of your health care power of attorney and living will to the office at your convenience.  Conditions/risks identified: diabetic, discussed chronic care management team  Next appointment: Follow up in one year for your annual wellness visit.   Preventive Care 40-64 Years, Female Preventive care refers to lifestyle choices and visits with your health care provider that can promote health and wellness. What does preventive care include?  A yearly physical exam. This is also called an annual well check.  Dental exams once or twice a year.  Routine eye exams. Ask your health care provider how often you should have your eyes checked.  Personal lifestyle choices, including:  Daily care of your teeth and gums.  Regular physical activity.  Eating a healthy diet.  Avoiding tobacco and drug use.  Limiting alcohol use.  Practicing safe sex.  Taking low-dose aspirin daily starting at age 26.  Taking vitamin and mineral supplements as recommended by your health care provider. What happens during an annual well check? The services and screenings done by your health care provider during your annual well check will depend on your age, overall health, lifestyle risk factors, and family  history of disease. Counseling  Your health care provider may ask you questions about your:  Alcohol use.  Tobacco use.  Drug use.  Emotional well-being.  Home and relationship well-being.  Sexual activity.  Eating habits.  Work and work Statistician.  Method of birth control.  Menstrual cycle.  Pregnancy history. Screening  You may have the following tests or measurements:  Height, weight, and BMI.  Blood pressure.  Lipid and cholesterol levels. These may be checked every 5 years, or more frequently if you are over 37 years old.  Skin check.  Lung cancer screening. You may have this screening every year starting at age 91 if you have a 30-pack-year history of smoking and currently smoke or have quit within the past 15 years.  Fecal occult blood test (FOBT) of the stool. You may have this test every year starting at age 11.  Flexible sigmoidoscopy or colonoscopy. You may have a sigmoidoscopy every 5 years or a colonoscopy every 10 years starting at age 44.  Hepatitis C blood test.  Hepatitis B blood test.  Sexually transmitted disease (STD) testing.  Diabetes screening. This is done by checking your blood sugar (glucose) after you have not eaten for a while (fasting). You may have this done every 1-3 years.  Mammogram. This may be done every 1-2 years. Talk to your health care provider about when you should start having regular mammograms. This may depend on whether you have a family history of breast cancer.  BRCA-related cancer screening. This may be done if you have a family history of breast, ovarian, tubal, or peritoneal cancers.  Pelvic exam and Pap test. This may be done every 3 years starting at age  21. Starting at age 59, this may be done every 5 years if you have a Pap test in combination with an HPV test.  Bone density scan. This is done to screen for osteoporosis. You may have this scan if you are at high risk for osteoporosis. Discuss your test  results, treatment options, and if necessary, the need for more tests with your health care provider. Vaccines  Your health care provider may recommend certain vaccines, such as:  Influenza vaccine. This is recommended every year.  Tetanus, diphtheria, and acellular pertussis (Tdap, Td) vaccine. You may need a Td booster every 10 years.  Zoster vaccine. You may need this after age 73.  Pneumococcal 13-valent conjugate (PCV13) vaccine. You may need this if you have certain conditions and were not previously vaccinated.  Pneumococcal polysaccharide (PPSV23) vaccine. You may need one or two doses if you smoke cigarettes or if you have certain conditions. Talk to your health care provider about which screenings and vaccines you need and how often you need them. This information is not intended to replace advice given to you by your health care provider. Make sure you discuss any questions you have with your health care provider. Document Released: 12/11/2015 Document Revised: 08/03/2016 Document Reviewed: 09/15/2015 Elsevier Interactive Patient Education  2017 [place] Prevention in the Home Falls can cause injuries. They can happen to people of all ages. There are many things you can do to make your home safe and to help prevent falls. What can I do on the outside of my home?  Regularly fix the edges of walkways and driveways and fix any cracks.  Remove anything that might make you trip as you walk through a door, such as a raised step or threshold.  Trim any bushes or trees on the path to your home.  Use bright outdoor lighting.  Clear any walking paths of anything that might make someone trip, such as rocks or tools.  Regularly check to see if handrails are loose or broken. Make sure that both sides of any steps have handrails.  Any raised decks and porches should have guardrails on the edges.  Have any leaves, snow, or ice cleared regularly.  Use sand or salt on  walking paths during winter.  Clean up any spills in your garage right away. This includes oil or grease spills. What can I do in the bathroom?  Use night lights.  Install grab bars by the toilet and in the tub and shower. Do not use towel bars as grab bars.  Use non-skid mats or decals in the tub or shower.  If you need to sit down in the shower, use a plastic, non-slip stool.  Keep the floor dry. Clean up any water that spills on the floor as soon as it happens.  Remove soap buildup in the tub or shower regularly.  Attach bath mats securely with double-sided non-slip rug tape.  Do not have throw rugs and other things on the floor that can make you trip. What can I do in the bedroom?  Use night lights.  Make sure that you have a light by your bed that is easy to reach.  Do not use any sheets or blankets that are too big for your bed. They should not hang down onto the floor.  Have a firm chair that has side arms. You can use this for support while you get dressed.  Do not have throw rugs and other things on  the floor that can make you trip. What can I do in the kitchen?  Clean up any spills right away.  Avoid walking on wet floors.  Keep items that you use a lot in easy-to-reach places.  If you need to reach something above you, use a strong step stool that has a grab bar.  Keep electrical cords out of the way.  Do not use floor polish or wax that makes floors slippery. If you must use wax, use non-skid floor wax.  Do not have throw rugs and other things on the floor that can make you trip. What can I do with my stairs?  Do not leave any items on the stairs.  Make sure that there are handrails on both sides of the stairs and use them. Fix handrails that are broken or loose. Make sure that handrails are as long as the stairways.  Check any carpeting to make sure that it is firmly attached to the stairs. Fix any carpet that is loose or worn.  Avoid having throw  rugs at the top or bottom of the stairs. If you do have throw rugs, attach them to the floor with carpet tape.  Make sure that you have a light switch at the top of the stairs and the bottom of the stairs. If you do not have them, ask someone to add them for you. What else can I do to help prevent falls?  Wear shoes that:  Do not have high heels.  Have rubber bottoms.  Are comfortable and fit you well.  Are closed at the toe. Do not wear sandals.  If you use a stepladder:  Make sure that it is fully opened. Do not climb a closed stepladder.  Make sure that both sides of the stepladder are locked into place.  Ask someone to hold it for you, if possible.  Clearly mark and make sure that you can see:  Any grab bars or handrails.  First and last steps.  Where the edge of each step is.  Use tools that help you move around (mobility aids) if they are needed. These include:  Canes.  Walkers.  Scooters.  Crutches.  Turn on the lights when you go into a dark area. Replace any light bulbs as soon as they burn out.  Set up your furniture so you have a clear path. Avoid moving your furniture around.  If any of your floors are uneven, fix them.  If there are any pets around you, be aware of where they are.  Review your medicines with your doctor. Some medicines can make you feel dizzy. This can increase your chance of falling. Ask your doctor what other things that you can do to help prevent falls. This information is not intended to replace advice given to you by your health care provider. Make sure you discuss any questions you have with your health care provider. Document Released: 09/10/2009 Document Revised: 04/21/2016 Document Reviewed: 12/19/2014 Elsevier Interactive Patient Education  2017 [employer].

## 2019-08-27 ENCOUNTER — Other Ambulatory Visit: Payer: Self-pay | Admitting: Nurse Practitioner

## 2019-08-27 DIAGNOSIS — Z1231 Encounter for screening mammogram for malignant neoplasm of breast: Secondary | ICD-10-CM

## 2019-08-28 ENCOUNTER — Encounter: Payer: Self-pay | Admitting: Nurse Practitioner

## 2019-08-28 ENCOUNTER — Other Ambulatory Visit: Payer: Self-pay

## 2019-08-28 ENCOUNTER — Ambulatory Visit (INDEPENDENT_AMBULATORY_CARE_PROVIDER_SITE_OTHER): Payer: Medicare Other | Admitting: Nurse Practitioner

## 2019-08-28 ENCOUNTER — Telehealth: Payer: Self-pay

## 2019-08-28 VITALS — BP 109/66 | Temp 98.1°F | Ht 64.0 in | Wt 206.0 lb

## 2019-08-28 DIAGNOSIS — N183 Chronic kidney disease, stage 3 unspecified: Secondary | ICD-10-CM

## 2019-08-28 DIAGNOSIS — E1169 Type 2 diabetes mellitus with other specified complication: Secondary | ICD-10-CM

## 2019-08-28 DIAGNOSIS — I1310 Hypertensive heart and chronic kidney disease without heart failure, with stage 1 through stage 4 chronic kidney disease, or unspecified chronic kidney disease: Secondary | ICD-10-CM

## 2019-08-28 DIAGNOSIS — E785 Hyperlipidemia, unspecified: Secondary | ICD-10-CM

## 2019-08-28 DIAGNOSIS — E1121 Type 2 diabetes mellitus with diabetic nephropathy: Secondary | ICD-10-CM | POA: Diagnosis not present

## 2019-08-28 LAB — MICROALBUMIN, URINE WAIVED
Creatinine, Urine Waived: 10 mg/dL (ref 10–300)
Microalb, Ur Waived: 10 mg/L (ref 0–19)
Microalb/Creat Ratio: <30 mg/g (ref <30)

## 2019-08-28 LAB — BAYER DCA HB A1C WAIVED: HB A1C (BAYER DCA - WAIVED): 6.2 % (ref <7.0)

## 2019-08-28 NOTE — Assessment & Plan Note (Addendum)
Chronic, ongoing.  At goal with BP today and recommend she check BP at home three mornings a week and document for provider.  Continue current medication regimen.  CMP today.  Educated her on Telmisartan and kidney benefit.  Urine micro alb 10 and A:C <30.

## 2019-08-28 NOTE — Assessment & Plan Note (Signed)
Chronic, ongoing.  Continue current medication regimen and adjust as needed.  CMP today. 

## 2019-08-28 NOTE — Patient Instructions (Signed)
Carbohydrate Counting for Diabetes Mellitus, Adult  Carbohydrate counting is a method of keeping track of how many carbohydrates you eat. Eating carbohydrates naturally increases the amount of sugar (glucose) in the blood. Counting how many carbohydrates you eat helps keep your blood glucose within normal limits, which helps you manage your diabetes (diabetes mellitus). It is important to know how many carbohydrates you can safely have in each meal. This is different for every person. A diet and nutrition specialist (registered dietitian) can help you make a meal plan and calculate how many carbohydrates you should have at each meal and snack. Carbohydrates are found in the following foods:  Grains, such as breads and cereals.  Dried beans and soy products.  Starchy vegetables, such as potatoes, peas, and corn.  Fruit and fruit juices.  Milk and yogurt.  Sweets and snack foods, such as cake, cookies, candy, chips, and soft drinks. How do I count carbohydrates? There are two ways to count carbohydrates in food. You can use either of the methods or a combination of both. Reading "Nutrition Facts" on packaged food The "Nutrition Facts" list is included on the labels of almost all packaged foods and beverages in the [place] It includes:  The serving size.  Information about nutrients in each serving, including the grams (g) of carbohydrate per serving. To use the "Nutrition Facts":  Decide how many servings you will have.  Multiply the number of servings by the number of carbohydrates per serving.  The resulting number is the total amount of carbohydrates that you will be having. Learning standard serving sizes of other foods When you eat carbohydrate foods that are not packaged or do not include "Nutrition Facts" on the label, you need to measure the servings in order to count the amount of carbohydrates:  Measure the foods that you will eat with a food scale or measuring cup, if needed.   Decide how many standard-size servings you will eat.  Multiply the number of servings by 15. Most carbohydrate-rich foods have about 15 g of carbohydrates per serving. ? For example, if you eat 8 oz (170 g) of strawberries, you will have eaten 2 servings and 30 g of carbohydrates (2 servings x 15 g = 30 g).  For foods that have more than one food mixed, such as soups and casseroles, you must count the carbohydrates in each food that is included. The following list contains standard serving sizes of common carbohydrate-rich foods. Each of these servings has about 15 g of carbohydrates:   hamburger bun or  English muffin.   oz (15 mL) syrup.   oz (14 g) jelly.  1 slice of bread.  1 six-inch tortilla.  3 oz (85 g) cooked rice or pasta.  4 oz (113 g) cooked dried beans.  4 oz (113 g) starchy vegetable, such as peas, corn, or potatoes.  4 oz (113 g) hot cereal.  4 oz (113 g) mashed potatoes or  of a large baked potato.  4 oz (113 g) canned or frozen fruit.  4 oz (120 mL) fruit juice.  4-6 crackers.  6 chicken nuggets.  6 oz (170 g) unsweetened dry cereal.  6 oz (170 g) plain fat-free yogurt or yogurt sweetened with artificial sweeteners.  8 oz (240 mL) milk.  8 oz (170 g) fresh fruit or one small piece of fruit.  24 oz (680 g) popped popcorn. Example of carbohydrate counting Sample meal  3 oz (85 g) chicken breast.  6 oz (170 g)   brown rice.  4 oz (113 g) corn.  8 oz (240 mL) milk.  8 oz (170 g) strawberries with sugar-free whipped topping. Carbohydrate calculation 1. Identify the foods that contain carbohydrates: ? Rice. ? Corn. ? Milk. ? Strawberries. 2. Calculate how many servings you have of each food: ? 2 servings rice. ? 1 serving corn. ? 1 serving milk. ? 1 serving strawberries. 3. Multiply each number of servings by 15 g: ? 2 servings rice x 15 g = 30 g. ? 1 serving corn x 15 g = 15 g. ? 1 serving milk x 15 g = 15 g. ? 1 serving  strawberries x 15 g = 15 g. 4. Add together all of the amounts to find the total grams of carbohydrates eaten: ? 30 g + 15 g + 15 g + 15 g = 75 g of carbohydrates total. Summary  Carbohydrate counting is a method of keeping track of how many carbohydrates you eat.  Eating carbohydrates naturally increases the amount of sugar (glucose) in the blood.  Counting how many carbohydrates you eat helps keep your blood glucose within normal limits, which helps you manage your diabetes.  A diet and nutrition specialist (registered dietitian) can help you make a meal plan and calculate how many carbohydrates you should have at each meal and snack. This information is not intended to replace advice given to you by your health care provider. Make sure you discuss any questions you have with your health care provider. Document Released: 11/14/2005 Document Revised: 06/08/2017 Document Reviewed: 04/27/2016 Elsevier Patient Education  2020 Elsevier Inc.  

## 2019-08-28 NOTE — Progress Notes (Signed)
BP 109/66   Temp 98.1 F (36.7 C) (Oral)   Ht 5\' 4"  (1.626 m)   Wt 206 lb (93.4 kg)   LMP  (LMP Unknown)   SpO2 100%   BMI 35.36 kg/m    Subjective:    Patient ID: Kim Thomas Overall, female    DOB: 11/14/1956, 63 y.o.   MRN: [id number]  HPI: Kim Thomas is a 63 y.o. female  Chief Complaint  Patient presents with  . Diabetes  . Hypertension  . Hyperlipidemia   DIABETES Continues on Glipizide 2.5 MG (taking 3-4 times a week, not daily) and Jardiance 25 MG daily.  Previously A1C 6.1%.  Her goal is to come completely off medication, she reports she has been focusing on diet more now since her education.  She was restarted on medication in January due to A1C 12.1%.  Can not take Metformin due to kidney function and GLP caused GI issues.   Hypoglycemic episodes:no Polydipsia/polyuria: no Visual disturbance: no Chest pain: no Paresthesias: no Glucose Monitoring: yes  Accucheck frequency: twice a week  Fasting glucose: 99 to 117  Post prandial:  Evening: 135 to 138  Before meals: 120 at times Taking Insulin?: no  Long acting insulin:  Short acting insulin: Blood Pressure Monitoring: not checking Retinal Examination: Up to Date Foot Exam: Up to Date Diabetic Education: Completed Pneumovax: refused Influenza: refused Aspirin: no   HYPERTENSION / HYPERLIPIDEMIA Continues on Atorvastatin 20 MG daily and Telmisartan 40 MG.  She would like to come off of medications someday, which provider discussed at length with her today.  Her goal is to focus more on natural supplements someday. Satisfied with current treatment? yes Duration of hypertension: chronic BP monitoring frequency: not checking BP range:  BP medication side effects: no Duration of hyperlipidemia: chronic Cholesterol medication side effects: no Cholesterol supplements: none Medication compliance: good compliance Aspirin: no Recent stressors: no Recurrent headaches: no Visual changes: no Palpitations: no  Dyspnea: no Chest pain: no Lower extremity edema: no Dizzy/lightheaded: no  Relevant past medical, surgical, family and social history reviewed and updated as indicated. Interim medical history since our last visit reviewed. Allergies and medications reviewed and updated.  Review of Systems  Constitutional: Negative for activity change, appetite change, diaphoresis, fatigue and fever.  Respiratory: Negative for cough, chest tightness and shortness of breath.   Cardiovascular: Negative for chest pain, palpitations and leg swelling.  Gastrointestinal: Negative for abdominal distention, abdominal pain, constipation, diarrhea, nausea and vomiting.  Endocrine: Negative for cold intolerance, heat intolerance, polydipsia, polyphagia and polyuria.  Neurological: Negative for dizziness, syncope, weakness, light-headedness, numbness and headaches.  Psychiatric/Behavioral: Negative.     Per HPI unless specifically indicated above     Objective:    BP 109/66   Temp 98.1 F (36.7 C) (Oral)   Ht 5\' 4"  (1.626 m)   Wt 206 lb (93.4 kg)   LMP  (LMP Unknown)   SpO2 100%   BMI 35.36 kg/m   Wt Readings from Last 3 Encounters:  08/28/19 206 lb (93.4 kg)  08/14/19 204 lb (92.5 kg)  01/31/19 189 lb 12.8 oz (86.1 kg)    Physical Exam Vitals signs and nursing note reviewed.  Constitutional:      General: She is awake. She is not in acute distress.    Appearance: She is well-developed. She is obese. She is not ill-appearing.  HENT:     Head: Normocephalic.     Right Ear: Hearing normal.     Left Ear: Hearing  normal.  Eyes:     General: Lids are normal.        Right eye: No discharge.        Left eye: No discharge.     Conjunctiva/sclera: Conjunctivae normal.     Pupils: Pupils are equal, round, and reactive to light.  Neck:     Musculoskeletal: Normal range of motion and neck supple.     Vascular: No carotid bruit.  Cardiovascular:     Rate and Rhythm: Normal rate and regular rhythm.      Heart sounds: Normal heart sounds. No murmur. No gallop.   Pulmonary:     Effort: Pulmonary effort is normal. No accessory muscle usage or respiratory distress.     Breath sounds: Normal breath sounds.  Abdominal:     General: Bowel sounds are normal.     Palpations: Abdomen is soft.  Musculoskeletal:     Right lower leg: No edema.     Left lower leg: No edema.  Skin:    General: Skin is warm and dry.  Neurological:     Mental Status: She is alert and oriented to person, place, and time.  Psychiatric:        Attention and Perception: Attention normal.        Mood and Affect: Mood normal.        Behavior: Behavior normal. Behavior is cooperative.        Thought Content: Thought content normal.        Judgment: Judgment normal.    Diabetic Foot Exam - Simple   Simple Foot Form Visual Inspection No deformities, no ulcerations, no other skin breakdown bilaterally: Yes Sensation Testing Intact to touch and monofilament testing bilaterally: Yes Pulse Check Posterior Tibialis and Dorsalis pulse intact bilaterally: Yes Comments     Results for orders placed or performed in visit on 08/15/19  HM DIABETES EYE EXAM  Result Value Ref Range   HM Diabetic Eye Exam No Retinopathy No Retinopathy      Assessment & Plan:   Problem List Items Addressed This Visit      Cardiovascular and Mediastinum   Hypertensive heart/kidney disease without HF and with CKD stage III (HCC)    Chronic, ongoing.  At goal with BP today and recommend she check BP at home three mornings a week and document for provider.  Continue current medication regimen.  CMP today.  Educated her on Telmisartan and kidney benefit.  Urine micro alb 10 and A:C <30.      Relevant Orders   Comprehensive metabolic panel     Endocrine   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  A1C today 6.2%.  Continue Jardiance at this time with goal to someday d/c if possible + Glipizide  on her current schedule.  Continue diet focus and monitoring BS.  Praised for continued success.  Return in 3 months.      Relevant Orders   Bayer DCA Hb A1c Waived   Microalbumin, Urine Waived   Hyperlipidemia associated with type 2 diabetes mellitus (HCC)    Chronic, ongoing.  Continue current medication regimen and adjust as needed.  CMP today.        Relevant Orders   Comprehensive metabolic panel       Follow up plan: Return in about 3 months (around 11/27/2019) for T2DM, HTN/HLD.

## 2019-08-28 NOTE — Assessment & Plan Note (Signed)
Chronic, ongoing.  A1C today 6.2%.  Continue Jardiance at this time with goal to someday d/c if possible + Glipizide on her current schedule.  Continue diet focus and monitoring BS.  Praised for continued success.  Return in 3 months.

## 2019-08-29 LAB — COMPREHENSIVE METABOLIC PANEL
ALT: 19 IU/L (ref 0–32)
AST: 18 IU/L (ref 0–40)
Albumin/Globulin Ratio: 1.4 (ref 1.2–2.2)
Albumin: 4.3 g/dL (ref 3.8–4.8)
Alkaline Phosphatase: 109 IU/L (ref 39–117)
BUN/Creatinine Ratio: 14 (ref 12–28)
BUN: 17 mg/dL (ref 8–27)
Bilirubin Total: <0.2 mg/dL (ref 0.0–1.2)
CO2: 21 mmol/L (ref 20–29)
Calcium: 9.5 mg/dL (ref 8.7–10.3)
Chloride: 106 mmol/L (ref 96–106)
Creatinine, Ser: 1.22 mg/dL — ABNORMAL HIGH (ref 0.57–1.00)
GFR calc Af Amer: 54 mL/min/{1.73_m2} — ABNORMAL LOW (ref >59)
GFR calc non Af Amer: 47 mL/min/{1.73_m2} — ABNORMAL LOW (ref >59)
Globulin, Total: 3.1 g/dL (ref 1.5–4.5)
Glucose: 110 mg/dL — ABNORMAL HIGH (ref 65–99)
Potassium: 5.2 mmol/L (ref 3.5–5.2)
Sodium: 141 mmol/L (ref 134–144)
Total Protein: 7.4 g/dL (ref 6.0–8.5)

## 2019-08-29 NOTE — Progress Notes (Signed)
Patient notified and verbalized understanding. 

## 2019-09-13 ENCOUNTER — Other Ambulatory Visit: Payer: Self-pay | Admitting: Nurse Practitioner

## 2019-09-13 DIAGNOSIS — I10 Essential (primary) hypertension: Secondary | ICD-10-CM

## 2019-09-13 NOTE — Telephone Encounter (Signed)
Requested medication (s) are due for refill today: yes  Requested medication (s) are on the active medication list: yes  Last refill:  06/12/2019  Future visit scheduled: yes  Notes to clinic:  Angiotensin receptor blockers failed  Requested Prescriptions  Pending Prescriptions Disp Refills   telmisartan (MICARDIS) 40 MG tablet [Pharmacy Med Name: Telmisartan 40 MG Oral Tablet] 90 tablet 0    Sig: Take 1 tablet by mouth once daily     Cardiovascular:  Angiotensin Receptor Blockers Failed - 09/13/2019  6:07 PM      Failed - Cr in normal range and within 180 days    Creatinine, Ser  Date Value Ref Range Status  08/28/2019 1.22 (H) 0.57 - 1.00 mg/dL Final         Passed - K in normal range and within 180 days    Potassium  Date Value Ref Range Status  08/28/2019 5.2 3.5 - 5.2 mmol/L Final         Passed - Patient is not pregnant      Passed - Last BP in normal range    BP Readings from Last 1 Encounters:  08/28/19 109/66         Passed - Valid encounter within last 6 months    Recent Outpatient Visits          2 weeks ago Type 2 diabetes mellitus with diabetic nephropathy, without long-term current use of insulin ([place])   Spencer, McConnell T, NP   3 months ago Type 2 diabetes mellitus with diabetic nephropathy, without long-term current use of insulin ([place])   Dale, Jolene T, NP   8 months ago Type 2 diabetes mellitus with diabetic nephropathy, without long-term current use of insulin ([place])   McCook, Jolene T, NP   8 months ago Type 2 diabetes mellitus with diabetic nephropathy, without long-term current use of insulin ([place])   [place], Jolene T, NP   9 months ago Type 2 diabetes mellitus with diabetic nephropathy, without long-term current use of insulin ([place])   [place], [name], NP      Future Appointments            In 2 months Cannady,  [name], NP [employer]

## 2019-09-20 ENCOUNTER — Telehealth: Payer: Self-pay

## 2019-09-25 ENCOUNTER — Telehealth: Payer: Self-pay

## 2019-10-11 ENCOUNTER — Other Ambulatory Visit: Payer: Self-pay | Admitting: Nurse Practitioner

## 2019-10-11 NOTE — Telephone Encounter (Signed)
Forwarding medication refill request to PCP for review. 

## 2019-10-14 ENCOUNTER — Other Ambulatory Visit: Payer: Self-pay

## 2019-10-14 ENCOUNTER — Other Ambulatory Visit: Payer: Medicare Other

## 2019-10-14 MED ORDER — ONETOUCH DELICA PLUS LANCET30G MISC: 1.0000 | Freq: Two times a day (BID) | 12 refills | Status: DC

## 2019-10-18 ENCOUNTER — Telehealth: Payer: Self-pay

## 2019-10-18 ENCOUNTER — Ambulatory Visit: Payer: Self-pay | Admitting: Pharmacist

## 2019-10-18 NOTE — Chronic Care Management (AMB) (Signed)
  Chronic Care Management   Note  10/18/2019 Name: Kim Thomas MRN: [id number] DOB: January 10, 1956  Kim Thomas is a 63 y.o. year old female who is a primary care patient of Cannady, [name], NP. The CCM team was consulted for assistance with chronic disease management and care coordination needs.    Attempted to contact patient for medication management review and discussion of CCM program. Left HIPAA compliant message for patient to return my call at her convenience.   Follow up plan: - If I do not hear back, CCM team will outreach again over the next 3-6 weeks  Catie Darnelle Maffucci, PharmD Clinical Pharmacist [place] [phone]

## 2019-11-05 ENCOUNTER — Ambulatory Visit: Payer: Self-pay | Admitting: *Deleted

## 2019-11-05 ENCOUNTER — Telehealth: Payer: Self-pay

## 2019-11-05 NOTE — Chronic Care Management (AMB) (Signed)
  Chronic Care Management   Outreach Note  11/05/2019 Name: Kim Thomas MRN: [id number] DOB: [birth year]  Referred by: Venita Lick, NP Reason for referral : Chronic Care Management (UNsuccessful Outreach )   An unsuccessful telephone outreach was attempted today. The patient was referred to the case management team by for assistance with care management and care coordination.   Follow Up Plan: A HIPPA compliant phone message was left for the patient providing contact information and requesting a return call.  The care management team will reach out to the patient again over the next 60 days.   Merlene Morse Jacalynn Buzzell RN, BSN Nurse Case Editor, commissioning Family Practice/[facility] Care Management  [phone]) Business Mobile

## 2019-11-06 ENCOUNTER — Ambulatory Visit: Payer: Self-pay | Admitting: *Deleted

## 2019-11-06 DIAGNOSIS — E1121 Type 2 diabetes mellitus with diabetic nephropathy: Secondary | ICD-10-CM

## 2019-11-06 DIAGNOSIS — M25562 Pain in left knee: Secondary | ICD-10-CM

## 2019-11-06 DIAGNOSIS — N183 Chronic kidney disease, stage 3 unspecified: Secondary | ICD-10-CM

## 2019-11-06 NOTE — Chronic Care Management (AMB) (Signed)
Chronic Care Management   Initial Visit Note  11/06/2019 Name: Kim Thomas MRN: [id number] DOB: Mar 20, 1956  Referred by: Venita Lick, NP Reason for referral : Chronic Care Management   Kim Thomas is a 63 y.o. year old female who is a primary care patient of Cannady, [name], NP. The CCM team was consulted for assistance with chronic disease management and care coordination needs related to HTN and DMII  Review of patient status, including review of consultants reports, relevant laboratory and other test results, and collaboration with appropriate care team members and the patient's provider was performed as part of comprehensive patient evaluation and provision of chronic care management services.    SDOH (Social Determinants of Health) screening performed today: Physical Activity. See Care Plan for related entries.   Medications: Outpatient Encounter Medications as of 11/06/2019  Medication Sig Note   acetaminophen (TYLENOL) 500 MG tablet Take 500 mg by mouth daily as needed.    atorvastatin (LIPITOR) 20 MG tablet TAKE 1 TABLET BY MOUTH  DAILY    BLACK CURRANT SEED OIL PO Take 1,250 mg by mouth 2 (two) times daily.    Blood Glucose Monitoring Suppl (ONE TOUCH ULTRA MINI) w/Device KIT As directed    cetirizine (ZYRTEC) 10 MG tablet Take 1 tablet (10 mg total) by mouth daily. 12/17/2018: As needed   Cholecalciferol (VITAMIN D3) 2000 units TABS Take 2,000 Units by mouth daily.    CINNAMON PO Take 2 capsules daily by mouth.    cyclobenzaprine (FLEXERIL) 10 MG tablet Take 10 mg by mouth at bedtime. Take 1/2 tablet to 1 as needed  At bed time    diclofenac sodium (VOLTAREN) 1 % GEL Apply topically.    fluticasone (FLONASE) 50 MCG/ACT nasal spray Place 1-2 sprays into both nostrils 2 (two) times daily.    gabapentin (NEURONTIN) 400 MG capsule Take 1 capsule by mouth 3 (three) times daily.    glipiZIDE (GLUCOTROL) 5 MG tablet Take 0.5 tablets (2.5 mg total) by mouth daily  before breakfast.    Glucosamine-Chondroit-Vit C-Mn (GLUCOSAMINE CHONDR 1500 COMPLX PO) Take by mouth.    glucose blood (ONE TOUCH ULTRA TEST) test strip USE TO CHECK BLOOD SUGAR  DAILY    JARDIANCE 25 MG TABS tablet TAKE 1 TABLET BY MOUTH  DAILY    Lancets (ONETOUCH DELICA PLUS YBFXOV29V) MISC 1 each by Does not apply route 2 (two) times daily.    Magnesium 250 MG TABS Take 250 mg by mouth daily.    Multiple Vitamin (MULTIVITAMIN) tablet Take 1 tablet by mouth daily.    Nutritional Supplements (IMMUNE ENHANCE PO) Take by mouth.    ondansetron (ZOFRAN) 4 MG tablet Take 1 tablet (4 mg total) by mouth every 8 (eight) hours as needed for nausea or vomiting. (Patient not taking: Reported on 08/14/6059)    ONETOUCH DELICA LANCETS FINE MISC USE TO CHECK BLOOD SUGAR  DAILY    Potassium 99 MG TABS Take by mouth.    pyridOXINE (VITAMIN B-6) 100 MG tablet Take 100 mg by mouth daily.    telmisartan (MICARDIS) 40 MG tablet Take 1 tablet by mouth once daily    triamcinolone cream (KENALOG) 0.1 % Apply 1 application topically 2 (two) times daily.    UNABLE TO FIND Cherry Juice- pt states she takes 1 tsp daily at bedtime    vitamin B-12 (CYANOCOBALAMIN) 500 MCG tablet Take 500 mcg by mouth daily.    No facility-administered encounter medications on file as of 11/06/2019.  Objective:   Goals Addressed            This Visit's Progress    RN- I want to work towards being able to exercise (pt-stated)       Current Barriers:   Lacks caregiver support- Patient moved here from [place]- sister lives in [place] and Fr[place]ley still live out [place]. Manages her own healthcare.  Chronic Disease Management support and education needs related to HTN, DMII, CKD, and neuropathy.   Nurse Case Manager Clinical Goal(s):   Over the next 90 days, patient will work with [facility] to address needs related to goals of returning to exercise for weight loss.   Interventions:   Evaluation of current  treatment plan related to DMII and patient's adherence to plan as established by provider.  Collaborated with PCP regarding patient's request for podiatry and chiropractor referral.   Discussed plans with patient for ongoing care management follow up and provided patient with direct contact information for care management team  Patient stated she was previously using orthotics in her shoes to decrease foot pain and she is in need of some more.   Patient stated she has chronic shoulder/back pain and would like to be "adjusted" by a chiropractor that is recommended by her PCP.   Patient stated she feels very supported by her current PCP Kim Thomas and is thankful for her commitment to assist patient in improving her health.  Patient stated she has a goal of losing 15lbs and would like to be able to get back to exercise but pain is hindering her at this time.   Patient Self Care Activities:   Self administers medications as prescribed  Attends all scheduled provider appointments  Performs ADL's independently  Performs IADL's independently  Patient unable to participate in exercise plan related to current pain  Initial goal documentation      RN- Managing chronic diseases (pt-stated)       Current Barriers:   Chronic Disease Management support, education, and care coordination needs related to HTN and DMII  Clinical Goal(s) related to HTN and DMII:  Over the next 90 days, patient will:   Work with the care management team to address educational, disease management, and care coordination needs   Begin or continue self health monitoring activities as directed today Measure and record cbg (blood glucose) x1 times daily  Call provider office for new or worsened signs and symptoms Blood glucose findings outside established parameters  Call care management team with questions or concerns  Verbalize basic understanding of patient centered plan of care established today  Interventions  related to HTN and DMII:   Evaluation of current treatment plans and patient's adherence to plan as established by provider  Assessed patient understanding of disease states  Assessed patient's education and care coordination needs  Provided disease specific education to patient   Collaborated with appropriate clinical care team members regarding patient needs  Patient Self Care Activities related to HTN and DMII:   Patient is unable to independently self-manage chronic health conditions  Initial goal documentation         Kim Thomas was given information about Chronic Care Management services today including:  1. CCM service includes personalized support from designated clinical staff supervised by her physician, including individualized plan of care and coordination with other care providers 2. 24/7 contact phone numbers for assistance for urgent and routine care needs. 3. Service will only be billed when office clinical staff spend 20 minutes or more in a  month to coordinate care. 4. Only one practitioner may furnish and bill the service in a calendar month. 5. The patient may stop CCM services at any time (effective at the end of the month) by phone call to the office staff. 6. The patient will be responsible for cost sharing (co-pay) of up to 20% of the service fee (after annual deductible is met).  Patient agreed to services and verbal consent obtained.   Plan:   The care management team will reach out to the patient again over the next 30 days.  The patient has been provided with contact information for the care management team and has been advised to call with any health related questions or concerns.   Kim Morse Juleen Sorrels RN, BSN Nurse Case Editor, commissioning Family Practice/[facility] Care [facility]  [phone]) Business Mobile

## 2019-11-11 ENCOUNTER — Other Ambulatory Visit: Payer: Self-pay | Admitting: Nurse Practitioner

## 2019-11-11 DIAGNOSIS — E1121 Type 2 diabetes mellitus with diabetic nephropathy: Secondary | ICD-10-CM

## 2019-11-11 DIAGNOSIS — G8929 Other chronic pain: Secondary | ICD-10-CM

## 2019-11-11 MED ORDER — GABAPENTIN 400 MG PO CAPS: 400.0000 mg | ORAL_CAPSULE | Freq: Three times a day (TID) | ORAL | 2 refills | Status: DC

## 2019-11-11 NOTE — Telephone Encounter (Signed)
Requested medication (s) are due for refill today: yes  Requested medication (s) are on the active medication list: yes  Last refill:  04/03/2018  Future visit scheduled: yes  Notes to clinic:  Patient states that pcp will be filling medication now   Requested Prescriptions  Pending Prescriptions Disp Refills   gabapentin (NEURONTIN) 400 MG capsule       Sig: Take 1 capsule (400 mg total) by mouth 3 (three) times daily.      Neurology: Anticonvulsants - gabapentin Passed - 11/11/2019  9:52 AM      Passed - Valid encounter within last 12 months    Recent Outpatient Visits           2 months ago Type 2 diabetes mellitus with diabetic nephropathy, without long-term current use of insulin ([place])   [place], Jolene T, NP   5 months ago Type 2 diabetes mellitus with diabetic nephropathy, without long-term current use of insulin ([place])   [place], Jolene T, NP   10 months ago Type 2 diabetes mellitus with diabetic nephropathy, without long-term current use of insulin ([place])   [place], Jolene T, NP   10 months ago Type 2 diabetes mellitus with diabetic nephropathy, without long-term current use of insulin ([place])   [place] Cannady, Jolene T, NP   10 months ago Type 2 diabetes mellitus with diabetic nephropathy, without long-term current use of insulin ([place])   [place], [name], NP       Future Appointments             In 3 weeks Cannady, [name], NP [employer]

## 2019-11-11 NOTE — Progress Notes (Signed)
Patient request for chiropractor and podiatry referrals.

## 2019-11-11 NOTE — Telephone Encounter (Signed)
Medication Refill - Medication: gabapentin (NEURONTIN) 400 MG capsule  Pt states PCP will now be refilling this rx.   Preferred Pharmacy:  [facility] Phone:  [phone]  Fax:  [phone]       Pt was advised that RX refills may take up to 3 business days. We ask that you follow-up with your pharmacy.

## 2019-11-11 NOTE — Patient Instructions (Signed)
Thank you allowing the Chronic Care Management Team to be a part of your care! It was a pleasure speaking with you today!  CCM (Chronic Care Management) Team   Elayah Klooster RN, BSN Nurse Care Coordinator  [phone]  [facility] PharmD  Clinical Pharmacist  [phone]  Eula Fried LCSW Clinical Social Worker [phone]  Goals Addressed            This Visit's Progress   . RN- I want to work towards being able to exercise (pt-stated)       Current Barriers:  . Lacks caregiver support- Patient moved here from [place]- sister lives in [place] and [place] still live out [place]. Manages her own healthcare. . Chronic Disease Management support and education needs related to HTN, DMII, CKD, and neuropathy.   Nurse Case Manager Clinical Goal(s):  [name] Over the next 90 days, patient will work with [facility] to address needs related to goals of returning to exercise for weight loss.   Interventions:  . Evaluation of current treatment plan related to DMII and patient's adherence to plan as established by provider. Nash Dimmer with PCP regarding patient's request for podiatry and chiropractor referral.  . Discussed plans with patient for ongoing care management follow up and provided patient with direct contact information for care management team . Patient stated she was previously using orthotics in her shoes to decrease foot pain and she is in need of some more.  . Patient stated she has chronic shoulder/back pain and would like to be "adjusted" by a chiropractor that is recommended by her PCP.  [name] Patient stated she feels very supported by her current PCP Jolene and is thankful for her commitment to assist patient in improving her health. . Patient stated she has a goal of losing 15lbs and would like to be able to get back to exercise but pain is hindering her at this time.   Patient Self Care Activities:  . Self administers medications as prescribed . Attends all scheduled  provider appointments . Performs ADL's independently . Performs IADL's independently . Patient unable to participate in exercise plan related to current pain  Initial goal documentation     . RN- Managing chronic diseases (pt-stated)       Current Barriers:  . Chronic Disease Management support, education, and care coordination needs related to HTN and DMII  Clinical Goal(s) related to HTN and DMII:  Over the next 90 days, patient will:  . Work with the care management team to address educational, disease management, and care coordination needs  . Begin or continue self health monitoring activities as directed today Measure and record cbg (blood glucose) x1 times daily . Call provider office for new or worsened signs and symptoms Blood glucose findings outside established parameters . Call care management team with questions or concerns . Verbalize basic understanding of patient centered plan of care established today  Interventions related to HTN and DMII:  . Evaluation of current treatment plans and patient's adherence to plan as established by provider . Assessed patient understanding of disease states . Assessed patient's education and care coordination needs . Provided disease specific education to patient  . Collaborated with appropriate clinical care team members regarding patient needs  Patient Self Care Activities related to HTN and DMII:  . Patient is unable to independently self-manage chronic health conditions  Initial goal documentation        The patient verbalized understanding of instructions provided today and declined a print copy  of patient instruction materials.   The patient has been provided with contact information for the care management team and has been advised to call with any health related questions or concerns.

## 2019-11-11 NOTE — Telephone Encounter (Signed)
Routing to provider  

## 2019-11-14 ENCOUNTER — Other Ambulatory Visit: Payer: Medicare Other

## 2019-11-14 ENCOUNTER — Ambulatory Visit: Payer: Medicare Other

## 2019-11-20 ENCOUNTER — Ambulatory Visit (INDEPENDENT_AMBULATORY_CARE_PROVIDER_SITE_OTHER): Payer: Medicare Other | Admitting: Pharmacist

## 2019-11-20 DIAGNOSIS — E1121 Type 2 diabetes mellitus with diabetic nephropathy: Secondary | ICD-10-CM | POA: Diagnosis not present

## 2019-11-20 NOTE — Chronic Care Management (AMB) (Signed)
Chronic Care Management   Note  11/20/2019 Name: Brooklyne Radke MRN: [id number] DOB: 1956/08/14   Subjective:  Kim Thomas is a 63 y.o. year old female who is a primary care patient of Cannady, [name], NP. The CCM team was consulted for assistance with chronic disease management and care coordination needs.    Contacted patient for medication management review.   Review of patient status, including review of consultants reports, laboratory and other test data, was performed as part of comprehensive evaluation and provision of chronic care management services.   Objective:  Lab Results  Component Value Date   CREATININE 1.22 (H) 08/28/2019   CREATININE 1.16 (H) 05/28/2019   CREATININE 1.39 (H) 01/07/2019    Lab Results  Component Value Date   HGBA1C 6.2 08/28/2019       Component Value Date/Time   CHOL 150 05/28/2019 1018   TRIG 92 05/28/2019 1018   HDL 58 10/10/2018 1024   CHOLHDL 2.5 07/10/2018 1503   VLDL 18 05/28/2019 1018   LDLCALC 55 10/10/2018 1024    Clinical ASCVD: No  The 10-year ASCVD risk score Mikey Bussing DC Jr., et al., 2013) is: 8.8%   Values used to calculate the score:     Age: 89 years     Sex: Female     Is Non-Hispanic African American: Yes     Diabetic: Yes     Tobacco smoker: No     Systolic Blood Pressure: 725 mmHg     Is BP treated: Yes     HDL Cholesterol: 72 mg/dL     Total Cholesterol: 150 mg/dL    BP Readings from Last 3 Encounters:  08/28/19 109/66  08/14/19 112/78  05/28/19 132/81    Allergies  Allergen Reactions  . Morphine And Related Itching    It burned, and made patient itch and go out of her mind.  . Erythromycin     "Been so long ago," patient unable to remember reaction  . Hydrocodone-Acetaminophen Itching    Medications Reviewed Today    Reviewed by De Hollingshead, [facility] (Pharmacist) on 11/20/19 at [place] List Status: <None>  Medication Order Taking? Sig Documenting Provider Last Dose Status Informant    acetaminophen (TYLENOL) 500 MG tablet [id number] Yes Take 500 mg by mouth daily as needed. [provider] Taking Active            Med Note Darnelle Maffucci, Arville Lime   Wed Nov 20, 2019  1:11 PM) Generally taking 500 mg BID, occasionally 1000 mg BID  atorvastatin (LIPITOR) 20 MG tablet [id number] Yes TAKE 1 TABLET BY MOUTH  DAILY Cannady, Jolene T, NP Taking Active   BLACK CURRANT SEED OIL PO [id number] Yes Take 1,250 mg by mouth 2 (two) times daily. [provider] Taking Active   Blood Glucose Monitoring Suppl (ONE TOUCH ULTRA MINI) w/Device KIT [id number] Yes As directed Marnee Guarneri T, NP Taking Active   cetirizine (ZYRTEC) 10 MG tablet [id number] Yes Take 1 tablet (10 mg total) by mouth daily. Volney American, PA-C Taking Active            Med Note Sharlyne Pacas Dec 17, 2018  2:06 PM) As needed  Cholecalciferol (VITAMIN D3) 2000 units TABS [id number] Yes Take 2,000 Units by mouth daily. [provider] Taking Active   Co-Enzyme Q-10 100 MG CAPS [id number] Yes Take 50 mg by mouth daily. [provider] Taking Active   cyclobenzaprine (FLEXERIL) 10 MG tablet [id number] No  Take 10 mg by mouth at bedtime. Take 1/2 tablet to 1 as needed  At bed time [provider] Not Taking Active   diclofenac sodium (VOLTAREN) 1 % GEL [id number] Yes Apply topically. [provider] Taking Active   fluticasone (FLONASE) 50 MCG/ACT nasal spray [id number] Yes Place 1-2 sprays into both nostrils 2 (two) times daily. Volney American, [place] Taking Active   gabapentin (NEURONTIN) 400 MG capsule [id number] Yes Take 1 capsule (400 mg total) by mouth 3 (three) times daily. Marnee Guarneri T, NP Taking Active   glipiZIDE (GLUCOTROL) 5 MG tablet [id number] Yes Take 0.5 tablets (2.5 mg total) by mouth daily before breakfast. Venita Lick, NP Taking Active            Med Note Kelby Aline Nov 20, 2019  1:06 PM) Taking every other day currently         Discontinued 11/20/19 1320 (Patient Preference)   glucose blood (ONE TOUCH ULTRA TEST) test strip [id number] Yes USE TO CHECK BLOOD SUGAR  DAILY Marnee Guarneri T, NP Taking Active   JARDIANCE 25 MG TABS tablet [id number] Yes TAKE 1 TABLET BY MOUTH  DAILY Venita Lick, NP Taking Active   Lancets (ONETOUCH DELICA PLUS LSLHTD42A) [place] [id number] Yes 1 each by Does not apply route 2 (two) times daily. Marnee Guarneri T, NP Taking Active         Discontinued 11/20/19 1316 (Patient Preference)   Multiple Vitamin (MULTIVITAMIN) tablet [id number] Yes Take 1 tablet by mouth daily. [provider] Taking Active Self        Discontinued 11/20/19 1320 (Patient Preference)   Omega-3 1000 MG CAPS [id number] Yes Take 1 capsule by mouth daily. [provider] Taking Active   ondansetron (ZOFRAN) 4 MG tablet [id number] No Take 1 tablet (4 mg total) by mouth every 8 (eight) hours as needed for nausea or vomiting.  Patient not taking: Reported on 08/14/2019   Marnee Guarneri T, NP Not Taking Active   Potassium 99 MG TABS [id number] Yes Take by mouth. [provider] Taking Active   telmisartan (MICARDIS) 40 MG tablet [id number] Yes Take 1 tablet by mouth once daily Cannady, Jolene T, NP Taking Active   triamcinolone cream (KENALOG) 0.1 % [id number] Yes Apply 1 application topically 2 (two) times daily. Marnee Guarneri T, NP Taking Active   vitamin E 100 UNIT capsule [id number] Yes Take 100 Units by mouth daily. [provider] Taking Active            Assessment:   Goals Addressed            This Visit's Progress     Patient Stated   . PharmD "I want to stay healthy" (pt-stated)       Current Barriers:  . Polypharmacy; complex patient with multiple comorbidities including T2DM, CKD, peripheral neuropathy, allergies . Self-manages medications o T2DM: last A1c 6.1%, currently taking Jardiance 25 mg daily; glipizide 2.5 mg every other day, plans to discontinue  glipizide; fasting sugars 90-110s, notes bedtime readings are 160s at highest   o HTN: telmisartan 40 mg daily;  o ASCVD risk reduction: atorvastatin 20 mg daily; last LDL at goal <70 o Peripheral neuropathy: gabapentin 400 mg TID; notes that she occasionally skips midday dose because it makes her sleepy o Allergies: cetirizine 10 mg daily PRN, flonase nasal PRN o Supplements: black seed oil, Vitamin E, CoQ10, omega 3 fatty acids   Pharmacist Clinical Goal(s):  [name] Over the  next 90 days, patient will work with PharmD and provider towards optimized medication management  Interventions: . Comprehensive medication review performed; medication list updated in electronic medical record . Reviewed goal A1c, goal fasting glucose, and goal 2 hour post prandial readings.  . Recommend discontinuing glipizide at next provider appointment, as previously planned . Discussed that gabapentin can commonly make people sleepy. If nerve pain remains well controlled without needing midday dose, that is fine. . Reviewed supplement use, purposes, and data behind it. Discussed importance of daily Vitamin D for bone health.  . She is excited for upcoming [place] and Chiropractor appointment.   Patient Self Care Activities:  . Patient will take medications as prescribed  Initial goal documentation        Plan: - Scheduled follow up phone call on 12/31/19 @ 1 pm  Catie Darnelle Maffucci, PharmD, [place] [phone]

## 2019-11-20 NOTE — Patient Instructions (Signed)
Visit Information  Goals Addressed            This Visit's Progress     Patient Stated   . PharmD "I want to stay healthy" (pt-stated)       Current Barriers:  . Polypharmacy; complex patient with multiple comorbidities including T2DM, CKD, peripheral neuropathy, allergies . Self-manages medications o T2DM: last A1c 6.1%, currently taking Jardiance 25 mg daily; glipizide 2.5 mg every other day, plans to discontinue glipizide; fasting sugars 90-110s, notes bedtime readings are 160s at highest   o HTN: telmisartan 40 mg daily;  o ASCVD risk reduction: atorvastatin 20 mg daily; last LDL at goal <70 o Peripheral neuropathy: gabapentin 400 mg TID; notes that she occasionally skips midday dose because it makes her sleepy o Allergies: cetirizine 10 mg daily PRN, flonase nasal PRN o Supplements: black seed oil, Vitamin E, CoQ10, omega 3 fatty acids   Pharmacist Clinical Goal(s):  [name] Over the next 90 days, patient will work with PharmD and provider towards optimized medication management  Interventions: . Comprehensive medication review performed; medication list updated in electronic medical record . Reviewed goal A1c, goal fasting glucose, and goal 2 hour post prandial readings.  . Recommend discontinuing glipizide at next provider appointment, as previously planned . Discussed that gabapentin can commonly make people sleepy. If nerve pain remains well controlled without needing midday dose, that is fine. . Reviewed supplement use, purposes, and data behind it. Discussed importance of daily Vitamin D for bone health.  . She is excited for upcoming [place] and Chiropractor appointment.   Patient Self Care Activities:  . Patient will take medications as prescribed  Initial goal documentation        The patient verbalized understanding of instructions provided today and declined a print copy of patient instruction materials.   Plan: - Scheduled follow up phone call on 12/31/19 @ 1  pm  Catie Darnelle Maffucci, PharmD, [place] [phone]

## 2019-11-25 ENCOUNTER — Ambulatory Visit: Payer: Medicare Other | Admitting: Podiatry

## 2019-11-25 ENCOUNTER — Encounter: Payer: Self-pay | Admitting: Podiatry

## 2019-11-25 ENCOUNTER — Other Ambulatory Visit: Payer: Self-pay

## 2019-11-25 DIAGNOSIS — E1142 Type 2 diabetes mellitus with diabetic polyneuropathy: Secondary | ICD-10-CM

## 2019-11-25 NOTE — Progress Notes (Signed)
This patient presents to the office for an evaluation of her diabetic feet.  She was referred to this office by Marnee Guarneri.  Patient states that she has a callus that developed on the bottom of her right foot which she works on herself with the blade and a rasping tool.  This patient states that she has difficulty walking and frequently stumbles.  She says after significant time her left foot is difficult to control.  She says that off her shoes appear to have marks caused by hitting her toe box as she walks.She has history of previous foot surgery.  Patient has previously been diagnosed with diabetic neuropathy.  She presents the office today for evaluation of this condition as well as a diabetic foot exam.    Vascular  Dorsalis pedis and posterior tibial pulses are palpable  B/L.  Capillary return  WNL.  Temperature gradient is  WNL.  Skin turgor  WNL  Sensorium  Senn Weinstein monofilament wire  WNL. Normal tactile sensation. Muscle power 1/4 left foot.  Muscle power 2/4  Right foot.  Nail Exam  Patient has normal nails with no evidence of bacterial or fungal infection.  Orthopedic  Exam  Muscle tone and muscle strength  WNL.  No limitations of motion feet  B/L.  No crepitus or joint effusion noted.  Hammer toe 2,4  B/L.  HAV  B/L.  MTA  B/L.  Skin  No open lesions.  Normal skin texture and turgor.    Musclle power wealness  B/L  E 11.4  IE  Diabetic foot exam.  Discussed this condition with this patient.  Told her her vascular and neurologic findings were within normal limits.  I told her I was concerned of the muscle weakness that was present in the left foot rater than the right foot.  Therefore I will refer her to Dr. Posey Pronto for a work-up and possible AFO bracing.  RTC 1 year for annual exam.  Appointment with Dr.  Posey Pronto when convenient.   Gardiner Barefoot DPM

## 2019-12-03 ENCOUNTER — Encounter: Payer: Self-pay | Admitting: Podiatry

## 2019-12-03 ENCOUNTER — Ambulatory Visit: Payer: Self-pay | Admitting: Pharmacist

## 2019-12-03 ENCOUNTER — Ambulatory Visit (INDEPENDENT_AMBULATORY_CARE_PROVIDER_SITE_OTHER): Payer: Medicare Other | Admitting: *Deleted

## 2019-12-03 ENCOUNTER — Ambulatory Visit: Payer: Medicare Other | Admitting: Podiatry

## 2019-12-03 ENCOUNTER — Ambulatory Visit (INDEPENDENT_AMBULATORY_CARE_PROVIDER_SITE_OTHER): Payer: Medicare Other

## 2019-12-03 ENCOUNTER — Other Ambulatory Visit: Payer: Self-pay

## 2019-12-03 ENCOUNTER — Ambulatory Visit (INDEPENDENT_AMBULATORY_CARE_PROVIDER_SITE_OTHER): Payer: Medicare Other | Admitting: Nurse Practitioner

## 2019-12-03 ENCOUNTER — Encounter: Payer: Self-pay | Admitting: Nurse Practitioner

## 2019-12-03 VITALS — BP 130/75 | HR 93 | Temp 98.0°F

## 2019-12-03 DIAGNOSIS — M79671 Pain in right foot: Secondary | ICD-10-CM

## 2019-12-03 DIAGNOSIS — E1142 Type 2 diabetes mellitus with diabetic polyneuropathy: Secondary | ICD-10-CM | POA: Diagnosis not present

## 2019-12-03 DIAGNOSIS — I1310 Hypertensive heart and chronic kidney disease without heart failure, with stage 1 through stage 4 chronic kidney disease, or unspecified chronic kidney disease: Secondary | ICD-10-CM

## 2019-12-03 DIAGNOSIS — E1121 Type 2 diabetes mellitus with diabetic nephropathy: Secondary | ICD-10-CM

## 2019-12-03 DIAGNOSIS — Z9181 History of falling: Secondary | ICD-10-CM

## 2019-12-03 DIAGNOSIS — M25562 Pain in left knee: Secondary | ICD-10-CM

## 2019-12-03 DIAGNOSIS — E1169 Type 2 diabetes mellitus with other specified complication: Secondary | ICD-10-CM | POA: Diagnosis not present

## 2019-12-03 DIAGNOSIS — E559 Vitamin D deficiency, unspecified: Secondary | ICD-10-CM | POA: Insufficient documentation

## 2019-12-03 DIAGNOSIS — E785 Hyperlipidemia, unspecified: Secondary | ICD-10-CM

## 2019-12-03 DIAGNOSIS — N1831 Chronic kidney disease, stage 3a: Secondary | ICD-10-CM

## 2019-12-03 DIAGNOSIS — M79672 Pain in left foot: Secondary | ICD-10-CM

## 2019-12-03 DIAGNOSIS — M6281 Muscle weakness (generalized): Secondary | ICD-10-CM

## 2019-12-03 LAB — BAYER DCA HB A1C WAIVED: HB A1C (BAYER DCA - WAIVED): 7.5 % — ABNORMAL HIGH (ref <7.0)

## 2019-12-03 NOTE — Progress Notes (Signed)
Subjective:  Patient ID: Kim Thomas, female    DOB: August 28, 1956,  MRN: [id number]  Chief Complaint  Patient presents with  . Ankle Pain    pt is here from a referral from Dr. Prudence Davidson. Pt states that she is experiencing muscle weakness (no pain), that has been going on for about a year, pt states that she has recently recieved xrays for her spine and believes that that could be the cause for her muscle weakness.    64 y.o. female presents with the above complaint.  Patient presents with generalized left lower extremity weakness.  Patient was referred to me by Dr. Prudence Davidson.  Patient states been going on for about a year.  She has last seen her chiropractor which had told her that she may have nerve impingement coming from the back that could likely be causing the generalized weakness.  She also states that she had x-rays done of the spine which showed joint space narrowing as well as arthritic changes.  She states the weakness is elevated when is walking.  She has history of falling down.  She states that she falls down multiple times a week.  She would like to discuss if there is any kind of bracing or orthotics that can help address this as well as decrease the amount of falls.   Review of Systems: Negative except as noted in the HPI. Denies N/V/F/Ch.  Past Medical History:  Diagnosis Date  . Anxiety   . Chronic kidney disease   . DDD (degenerative disc disease), cervical   . Diabetes mellitus without complication ([place])   . Gout   . Hypertension   . Sleep apnea     Current Outpatient Medications:  .  acetaminophen (TYLENOL) 500 MG tablet, Take 500 mg by mouth daily as needed., Disp: , Rfl:  .  atorvastatin (LIPITOR) 20 MG tablet, TAKE 1 TABLET BY MOUTH  DAILY, Disp: 90 tablet, Rfl: 3 .  BLACK CURRANT SEED OIL PO, Take 1,250 mg by mouth 2 (two) times daily., Disp: , Rfl:  .  Blood Glucose Monitoring Suppl (ONE TOUCH ULTRA MINI) w/Device KIT, As directed, Disp: 1 kit, Rfl: 2 .  cetirizine  (ZYRTEC) 10 MG tablet, Take 1 tablet (10 mg total) by mouth daily., Disp: 90 tablet, Rfl: 3 .  Cholecalciferol (VITAMIN D3) 2000 units TABS, Take 2,000 Units by mouth daily., Disp: , Rfl:  .  Co-Enzyme Q-10 100 MG CAPS, Take 50 mg by mouth daily., Disp: , Rfl:  .  cyclobenzaprine (FLEXERIL) 10 MG tablet, Take 10 mg by mouth at bedtime. Take 1/2 tablet to 1 as needed  At bed time, Disp: , Rfl:  .  diclofenac sodium (VOLTAREN) 1 % GEL, Apply topically., Disp: , Rfl:  .  fluticasone (FLONASE) 50 MCG/ACT nasal spray, Place 1-2 sprays into both nostrils 2 (two) times daily., Disp: 16 g, Rfl: 6 .  gabapentin (NEURONTIN) 400 MG capsule, Take 1 capsule (400 mg total) by mouth 3 (three) times daily., Disp: 270 capsule, Rfl: 2 .  glipiZIDE (GLUCOTROL) 5 MG tablet, Take 0.5 tablets (2.5 mg total) by mouth daily before breakfast., Disp: 30 tablet, Rfl: 5 .  glucose blood (ONE TOUCH ULTRA TEST) test strip, USE TO CHECK BLOOD SUGAR  DAILY, Disp: 100 each, Rfl: 2 .  JARDIANCE 25 MG TABS tablet, TAKE 1 TABLET BY MOUTH  DAILY, Disp: 90 tablet, Rfl: 3 .  Lancets (ONETOUCH DELICA PLUS [id number]) MISC, 1 each by Does not apply route 2 (two) times daily.,  Disp: 100 each, Rfl: 12 .  Multiple Vitamin (MULTIVITAMIN) tablet, Take 1 tablet by mouth daily., Disp: , Rfl:  .  Omega-3 1000 MG CAPS, Take 1 capsule by mouth daily., Disp: , Rfl:  .  ondansetron (ZOFRAN) 4 MG tablet, Take 1 tablet (4 mg total) by mouth every 8 (eight) hours as needed for nausea or vomiting., Disp: 20 tablet, Rfl: 0 .  Potassium 99 MG TABS, Take by mouth., Disp: , Rfl:  .  telmisartan (MICARDIS) 40 MG tablet, Take 1 tablet by mouth once daily, Disp: 90 tablet, Rfl: 3 .  triamcinolone cream (KENALOG) 0.1 %, Apply 1 application topically 2 (two) times daily., Disp: 30 g, Rfl: 2 .  vitamin E 100 UNIT capsule, Take 100 Units by mouth daily., Disp: , Rfl:   Social History   Tobacco Use  Smoking Status Former Smoker  . Packs/day: 0.25  . Quit date:  05/21/2011  . Years since quitting: 8.5  Smokeless Tobacco Never Used    Allergies  Allergen Reactions  . Morphine And Related Itching    It burned, and made patient itch and go out of her mind.  . Erythromycin     "Been so long ago," patient unable to remember reaction  . Hydrocodone-Acetaminophen Itching   Objective:  There were no vitals filed for this visit. There is no height or weight on file to calculate BMI. Constitutional Well developed. Well nourished.  Vascular Dorsalis pedis pulses palpable bilaterally. Posterior tibial pulses palpable bilaterally. Capillary refill normal to all digits.  No cyanosis or clubbing noted. Pedal hair growth normal.  Neurologic Normal speech. Oriented to person, place, and time. Epicritic sensation to light touch grossly present bilaterally.  Dermatologic Nails well groomed and normal in appearance. No open wounds. No skin lesions.  Orthopedic:  Manual muscle testing of anterior compartment is 4 out of 5, posterior superficial and deep compartment is 4 out of 5, lateral compartment is 4 out of 5. Patient's gait was examined.  Patient may have of mild foot drop on the left side.  No other gait abnormalities noted.  Mild antalgic gait noted.   Radiographs: 2 views of skeletally mature adult ankle: Mild arthritic changes noted at the ankle joint.  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  Posterior heel spur noted.  No other bony deformities or arthritic changes noted. Assessment:   1. Diabetic polyneuropathy associated with type 2 diabetes mellitus ([place])   2. Generalized muscle weakness   3. History of fall    Plan:  Patient was evaluated and treated and all questions answered.  Intermittent generalized lower extremity left side weakness -I explained to the patient the likely cause of intermittent generalized weakness of the left lower extremity might possibly coming from the lower back.  Given the patient's chiropractor obtain  lower back x-ray which showed arthritic changes with possible nerve impingement I believe patient will benefit from seeing a spine specialist as the generalized weakness could be coming from lumbar region of the spine.  I will defer further recommendations to spine specialist. -I also believe that patient will benefit from balance bracing to prevent falls when she does experience generalized weakness.  Patient agrees with this plan and would like to get [facility] balance bracing to help prevent weakness therefore fall. -Patient will be scheduled to see Liliane Channel for [facility] bracing  Return in about 8 weeks (around 01/28/2020), or For Dr. Posey Pronto, for See Liliane Channel for [facility] bracing.

## 2019-12-03 NOTE — Assessment & Plan Note (Signed)
Chronic, ongoing.  A1C today 7.5%.  Continue Jardiance at this time with goal to someday d/c if possible + Glipizide on her current schedule.  Continue increased diet focus (had ate more sweets over holidays) and monitoring BS.  Return in 3 months.

## 2019-12-03 NOTE — Assessment & Plan Note (Signed)
Continue daily supplement and check Vit D level today.

## 2019-12-03 NOTE — Assessment & Plan Note (Signed)
Chronic, ongoing.  Continue current medication regimen and adjust as needed. Lipid panel today. 

## 2019-12-03 NOTE — Patient Instructions (Signed)
Visit Information  Goals Addressed            This Visit's Progress     Patient Stated   . PharmD "I want to stay healthy" (pt-stated)       Current Barriers:  . Polypharmacy; complex patient with multiple comorbidities including T2DM, CKD, peripheral neuropathy, allergies . Self-manages medications o T2DM: A1c today 7.5%, currently taking Jardiance 25 mg daily; glipizide 2.5 mg every other day, will avoid d/c glipizide at this time d/t elevated A1c. Patient noted that she has been eating more sweets lately over the holidays, and hasn't been exercising as much. Notes that she has been going to chiropractor and podiatrist, as related to goal of being able to exercise better w/o pain o HTN: telmisartan 40 mg daily;  o ASCVD risk reduction: atorvastatin 20 mg daily; last LDL at goal <70 o Peripheral neuropathy: gabapentin 400 mg TID; notes that she occasionally skips midday dose because it makes her sleepy o Allergies: cetirizine 10 mg daily PRN, flonase nasal PRN o Supplements: black seed oil, Vitamin E, CoQ10, omega 3 fatty acids   Pharmacist Clinical Goal(s):  [name] Over the next 90 days, patient will work with PharmD and provider towards optimized medication management  Interventions: . Reviewed goal A1c, goal fasting glucose, and goal 2 hour post prandial readings.  . Collaboratively discussed w/ RN CM lifestyle management strategies  . Will follow up with patient on exercise and BG at next phone visit  Patient Self Care Activities:  . Patient will take medications as prescribed  Please see past updates related to this goal by clicking on the "Past Updates" button in the selected goal         The patient verbalized understanding of instructions provided today and declined a print copy of patient instruction materials.   Plan: - Will follow up with patient at scheduled phone call on 12/31/19  Catie Feliz Beam, PharmD, [facility] Clinical Pharmacist [facility] [phone]

## 2019-12-03 NOTE — Assessment & Plan Note (Signed)
Chronic, ongoing.  Continue Telmisartan for kidney protection.  Check BMP today.  Refer to nephrology if decline noted in function. °

## 2019-12-03 NOTE — Patient Instructions (Signed)
Carbohydrate Counting for Diabetes Mellitus, Adult  Carbohydrate counting is a method of keeping track of how many carbohydrates you eat. Eating carbohydrates naturally increases the amount of sugar (glucose) in the blood. Counting how many carbohydrates you eat helps keep your blood glucose within normal limits, which helps you manage your diabetes (diabetes mellitus). It is important to know how many carbohydrates you can safely have in each meal. This is different for every person. A diet and nutrition specialist (registered dietitian) can help you make a meal plan and calculate how many carbohydrates you should have at each meal and snack. Carbohydrates are found in the following foods:  Grains, such as breads and cereals.  Dried beans and soy products.  Starchy vegetables, such as potatoes, peas, and corn.  Fruit and fruit juices.  Milk and yogurt.  Sweets and snack foods, such as cake, cookies, candy, chips, and soft drinks. How do I count carbohydrates? There are two ways to count carbohydrates in food. You can use either of the methods or a combination of both. Reading "Nutrition Facts" on packaged food The "Nutrition Facts" list is included on the labels of almost all packaged foods and beverages in the [place] It includes:  The serving size.  Information about nutrients in each serving, including the grams (g) of carbohydrate per serving. To use the "Nutrition Facts":  Decide how many servings you will have.  Multiply the number of servings by the number of carbohydrates per serving.  The resulting number is the total amount of carbohydrates that you will be having. Learning standard serving sizes of other foods When you eat carbohydrate foods that are not packaged or do not include "Nutrition Facts" on the label, you need to measure the servings in order to count the amount of carbohydrates:  Measure the foods that you will eat with a food scale or measuring cup, if  needed.  Decide how many standard-size servings you will eat.  Multiply the number of servings by 15. Most carbohydrate-rich foods have about 15 g of carbohydrates per serving. ? For example, if you eat 8 oz (170 g) of strawberries, you will have eaten 2 servings and 30 g of carbohydrates (2 servings x 15 g = 30 g).  For foods that have more than one food mixed, such as soups and casseroles, you must count the carbohydrates in each food that is included. The following list contains standard serving sizes of common carbohydrate-rich foods. Each of these servings has about 15 g of carbohydrates:   hamburger bun or  English muffin.   oz (15 mL) syrup.   oz (14 g) jelly.  1 slice of bread.  1 six-inch tortilla.  3 oz (85 g) cooked rice or pasta.  4 oz (113 g) cooked dried beans.  4 oz (113 g) starchy vegetable, such as peas, corn, or potatoes.  4 oz (113 g) hot cereal.  4 oz (113 g) mashed potatoes or  of a large baked potato.  4 oz (113 g) canned or frozen fruit.  4 oz (120 mL) fruit juice.  4-6 crackers.  6 chicken nuggets.  6 oz (170 g) unsweetened dry cereal.  6 oz (170 g) plain fat-free yogurt or yogurt sweetened with artificial sweeteners.  8 oz (240 mL) milk.  8 oz (170 g) fresh fruit or one small piece of fruit.  24 oz (680 g) popped popcorn. Example of carbohydrate counting Sample meal  3 oz (85 g) chicken breast.  6 oz (170 g)   brown rice.  4 oz (113 g) corn.  8 oz (240 mL) milk.  8 oz (170 g) strawberries with sugar-free whipped topping. Carbohydrate calculation 1. Identify the foods that contain carbohydrates: ? Rice. ? Corn. ? Milk. ? Strawberries. 2. Calculate how many servings you have of each food: ? 2 servings rice. ? 1 serving corn. ? 1 serving milk. ? 1 serving strawberries. 3. Multiply each number of servings by 15 g: ? 2 servings rice x 15 g = 30 g. ? 1 serving corn x 15 g = 15 g. ? 1 serving milk x 15 g = 15 g. ? 1  serving strawberries x 15 g = 15 g. 4. Add together all of the amounts to find the total grams of carbohydrates eaten: ? 30 g + 15 g + 15 g + 15 g = 75 g of carbohydrates total. Summary  Carbohydrate counting is a method of keeping track of how many carbohydrates you eat.  Eating carbohydrates naturally increases the amount of sugar (glucose) in the blood.  Counting how many carbohydrates you eat helps keep your blood glucose within normal limits, which helps you manage your diabetes.  A diet and nutrition specialist (registered dietitian) can help you make a meal plan and calculate how many carbohydrates you should have at each meal and snack. This information is not intended to replace advice given to you by your health care provider. Make sure you discuss any questions you have with your health care provider. Document Revised: 06/08/2017 Document Reviewed: 04/27/2016 Elsevier Patient Education  2020 [employer]vier Inc.  

## 2019-12-03 NOTE — Assessment & Plan Note (Signed)
Chronic, ongoing.  At goal with BP today and recommend she check BP at home three mornings a week and document for provider.  Continue current medication regimen.  BMP today.  Educated her on Telmisartan and kidney benefit.  Urine micro alb 10 and A:C <30 recent visit.

## 2019-12-03 NOTE — Progress Notes (Signed)
BP 130/75 (BP Location: Left Arm, Patient Position: Sitting, Cuff Size: Normal)   Pulse 93   Temp 98 F (36.7 C) (Oral)   LMP  (LMP Unknown)   SpO2 98%    Subjective:    Patient ID: Kim Thomas [place], female    DOB: [birth year], 64 y.o.   MRN: [id number]  HPI: Kim Thomas is a 64 y.o. female  Chief Complaint  Patient presents with  . Follow-up    3 month   DIABETES Continues on Glipizide 2.5 MG (taking 3-4 times a week, not daily) and Jardiance 25 MG daily. Previously A1C 6.2%.Her goal is to come completely off medication, she reports she has been focusing on diet more now since her education.  She was restarted on medication in January due to A1C 12.1%. Can not take Metformin due to kidney function and GLP caused GI issues. Has not been doing "any foolish stuff", she does endorse some weight gain and poor eating over Christmas.  Did cook some sweets and did not have family there to eat them. Hypoglycemic episodes:no Polydipsia/polyuria: no Visual disturbance: no Chest pain: no Paresthesias: no Glucose Monitoring: yes             Accucheck frequency: twice a week             Fasting glucose: 100 TO 110             Post prandial:             Evening: 150             Before meals:  Taking Insulin?: no             Long acting insulin:             Short acting insulin: Blood Pressure Monitoring: not checking Retinal Examination: Up to Date Foot Exam: Up to Date Diabetic Education: Completed Pneumovax: refused Influenza: refused Aspirin: no   HYPERTENSION / HYPERLIPIDEMIA Continues on Atorvastatin 20 MG daily and Telmisartan 40 MG.She would like to come off of medications someday, which provider discussed at length with her today. Her goal is to focus more on natural supplements someday. Satisfied with current treatment? yes Duration of hypertension: chronic BP monitoring frequency: not checking BP range:  BP medication side effects: no Duration of hyperlipidemia:  chronic Cholesterol medication side effects: no Cholesterol supplements: none Medication compliance: good compliance Aspirin: no Recent stressors: no Recurrent headaches: no Visual changes: no Palpitations: no Dyspnea: no Chest pain: no Lower extremity edema: no Dizzy/lightheaded: no  CHRONIC KIDNEY DISEASE CKD status: stable Medications renally dose: yes Previous renal evaluation: yes Pneumovax:  Not up to Date Influenza Vaccine:  Not up to Date   VITAMIN D DEFICIENCY: History of low level on labs, takes daily supplement.  No recent falls or fractures.    Relevant past medical, surgical, family and social history reviewed and updated as indicated. Interim medical history since our last visit reviewed. Allergies and medications reviewed and updated.  Review of Systems  Constitutional: Negative for activity change, appetite change, diaphoresis, fatigue and fever.  Respiratory: Negative for cough, chest tightness and shortness of breath.   Cardiovascular: Negative for chest pain, palpitations and leg swelling.  Gastrointestinal: Negative.   Endocrine: Negative for cold intolerance, heat intolerance, polydipsia, polyphagia and polyuria.  Neurological: Negative.   Psychiatric/Behavioral: Negative.     Per HPI unless specifically indicated above     Objective:    BP 130/75 (BP Location: Left Arm, Patient Position:  Sitting, Cuff Size: Normal)   Pulse 93   Temp 98 F (36.7 C) (Oral)   LMP  (LMP Unknown)   SpO2 98%   Wt Readings from Last 3 Encounters:  08/28/19 206 lb (93.4 kg)  08/14/19 204 lb (92.5 kg)  01/31/19 189 lb 12.8 oz (86.1 kg)    Physical Exam Vitals and nursing note reviewed.  Constitutional:      General: She is awake. She is not in acute distress.    Appearance: She is well-developed. She is obese. She is not ill-appearing.  HENT:     Head: Normocephalic.     Right Ear: Hearing normal.     Left Ear: Hearing normal.  Eyes:     General: Lids are  normal.        Right eye: No discharge.        Left eye: No discharge.     Conjunctiva/sclera: Conjunctivae normal.     Pupils: Pupils are equal, round, and reactive to light.  Neck:     Vascular: No carotid bruit.  Cardiovascular:     Rate and Rhythm: Normal rate and regular rhythm.     Heart sounds: Normal heart sounds. No murmur. No gallop.   Pulmonary:     Effort: Pulmonary effort is normal. No accessory muscle usage or respiratory distress.     Breath sounds: Normal breath sounds.  Abdominal:     General: Bowel sounds are normal.     Palpations: Abdomen is soft.  Musculoskeletal:     Cervical back: Normal range of motion and neck supple.     Right lower leg: No edema.     Left lower leg: No edema.  Skin:    General: Skin is warm and dry.  Neurological:     Mental Status: She is alert and oriented to person, place, and time.  Psychiatric:        Attention and Perception: Attention normal.        Mood and Affect: Mood normal.        Behavior: Behavior normal. Behavior is cooperative.        Thought Content: Thought content normal.        Judgment: Judgment normal.    Results for orders placed or performed in visit on 08/28/19  Comprehensive metabolic panel  Result Value Ref Range   Glucose 110 (H) 65 - 99 mg/dL   BUN 17 8 - 27 mg/dL   Creatinine, Ser 9.14 (H) 0.57 - 1.00 mg/dL   GFR calc non Af Amer 47 (L) >59 mL/min/1.73   GFR calc Af Amer 54 (L) >59 mL/min/1.73   BUN/Creatinine Ratio 14 12 - 28   Sodium 141 134 - 144 mmol/L   Potassium 5.2 3.5 - 5.2 mmol/L   Chloride 106 96 - 106 mmol/L   CO2 21 20 - 29 mmol/L   Calcium 9.5 8.7 - 10.3 mg/dL   Total Protein 7.4 6.0 - 8.5 g/dL   Albumin 4.3 3.8 - 4.8 g/dL   Globulin, Total 3.1 1.5 - 4.5 g/dL   Albumin/Globulin Ratio 1.4 1.2 - 2.2   Bilirubin Total <0.2 0.0 - 1.2 mg/dL   Alkaline Phosphatase 109 39 - 117 IU/L   AST 18 0 - 40 IU/L   ALT 19 0 - 32 IU/L  Bayer DCA Hb A1c Waived  Result Value Ref Range   HB A1C  (BAYER DCA - WAIVED) 6.2 <7.0 %  Microalbumin, Urine Waived  Result Value Ref Range   Microalb,  Ur Waived 10 0 - 19 mg/L   Creatinine, Urine Waived 10 10 - 300 mg/dL   Microalb/Creat Ratio <30 <30 mg/g      Assessment & Plan:   Problem List Items Addressed This Visit      Cardiovascular and Mediastinum   Hypertensive heart/kidney disease without HF and with CKD stage III    Chronic, ongoing.  At goal with BP today and recommend she check BP at home three mornings a week and document for provider.  Continue current medication regimen.  BMP today.  Educated her on Telmisartan and kidney benefit.  Urine micro alb 10 and A:C <30 recent visit.      Relevant Orders   Basic Metabolic Panel (BMET)     Endocrine   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  A1C today 7.5%.  Continue Jardiance at this time with goal to someday d/c if possible + Glipizide on her current schedule.  Continue increased diet focus (had ate more sweets over holidays) and monitoring BS.  Return in 3 months.      Relevant Orders   Basic Metabolic Panel (BMET)   Bayer DCA Hb A1c Waived   Hyperlipidemia associated with type 2 diabetes mellitus (HCC)    Chronic, ongoing.  Continue current medication regimen and adjust as needed.  Lipid panel today.      Relevant Orders   Lipid Panel w/o Chol/HDL Ratio out   Bayer DCA Hb A1c Waived     Genitourinary   CKD (chronic kidney disease), stage III    Chronic, ongoing.  Continue Telmisartan for kidney protection.  Check BMP today.  Refer to nephrology if decline noted in function.      Relevant Orders   Basic Metabolic Panel (BMET)     Other   Vitamin D deficiency    Continue daily supplement and check Vit D level today.      Relevant Orders   Vit D  25 hydroxy (rtn osteoporosis monitoring)       Follow up plan: Return in about 3 months (around 03/02/2020) for Annual physical with diabetes check.

## 2019-12-03 NOTE — Chronic Care Management (AMB) (Signed)
Chronic Care Management   Follow Up Note   12/03/2019 Name: Kim Thomas MRN: [id number] DOB: August 29, 1956  Referred by: Kim Lick, NP Reason for referral : Chronic Care Management (Medication Management)   Kim Thomas is a 64 y.o. year old female who is a primary care patient of Cannady, [name], NP. The CCM team was consulted for assistance with chronic disease management and care coordination needs.    Met with patient face to face after PCP visit today.   Review of patient status, including review of consultants reports, relevant laboratory and other test results, and collaboration with appropriate care team members and the patient's provider was performed as part of comprehensive patient evaluation and provision of chronic care management services.    SDOH (Social Determinants of Health) screening performed today: Physical Activity. See Care Plan for related entries.   Outpatient Encounter Medications as of 12/03/2019  Medication Sig Note  . acetaminophen (TYLENOL) 500 MG tablet Take 500 mg by mouth daily as needed. 11/20/2019: Generally taking 500 mg BID, occasionally 1000 mg BID  . atorvastatin (LIPITOR) 20 MG tablet TAKE 1 TABLET BY MOUTH  DAILY   . BLACK CURRANT SEED OIL PO Take 1,250 mg by mouth 2 (two) times daily.   . Blood Glucose Monitoring Suppl (ONE TOUCH ULTRA MINI) w/Device KIT As directed   . cetirizine (ZYRTEC) 10 MG tablet Take 1 tablet (10 mg total) by mouth daily. 12/17/2018: As needed  . Cholecalciferol (VITAMIN D3) 2000 units TABS Take 2,000 Units by mouth daily.   [name] Co-Enzyme Q-10 100 MG CAPS Take 50 mg by mouth daily.   . cyclobenzaprine (FLEXERIL) 10 MG tablet Take 10 mg by mouth at bedtime. Take 1/2 tablet to 1 as needed  At bed time   . diclofenac sodium (VOLTAREN) 1 % GEL Apply topically.   . fluticasone (FLONASE) 50 MCG/ACT nasal spray Place 1-2 sprays into both nostrils 2 (two) times daily.   [name] gabapentin (NEURONTIN) 400 MG capsule Take 1 capsule  (400 mg total) by mouth 3 (three) times daily.   [name] glipiZIDE (GLUCOTROL) 5 MG tablet Take 0.5 tablets (2.5 mg total) by mouth daily before breakfast. 11/20/2019: Taking every other day currently  . glucose blood (ONE TOUCH ULTRA TEST) test strip USE TO CHECK BLOOD SUGAR  DAILY   . JARDIANCE 25 MG TABS tablet TAKE 1 TABLET BY MOUTH  DAILY   . Lancets (ONETOUCH DELICA PLUS [id number]) MISC 1 each by Does not apply route 2 (two) times daily.   . Multiple Vitamin (MULTIVITAMIN) tablet Take 1 tablet by mouth daily.   . Omega-3 1000 MG CAPS Take 1 capsule by mouth daily.   . ondansetron (ZOFRAN) 4 MG tablet Take 1 tablet (4 mg total) by mouth every 8 (eight) hours as needed for nausea or vomiting.   . Potassium 99 MG TABS Take by mouth.   . telmisartan (MICARDIS) 40 MG tablet Take 1 tablet by mouth once daily   . triamcinolone cream (KENALOG) 0.1 % Apply 1 application topically 2 (two) times daily.   . vitamin E 100 UNIT capsule Take 100 Units by mouth daily.    No facility-administered encounter medications on file as of 12/03/2019.     Goals Addressed            This Visit's Progress     Patient Stated   . PharmD "I want to stay healthy" (pt-stated)       Current Barriers:  . Polypharmacy; complex patient with  multiple comorbidities including T2DM, CKD, peripheral neuropathy, allergies . Self-manages medications o T2DM: A1c today 7.5%, currently taking Jardiance 25 mg daily; glipizide 2.5 mg every other day, will avoid d/c glipizide at this time d/t elevated A1c. Patient noted that she has been eating more sweets lately over the holidays, and hasn't been exercising as much. Notes that she has been going to chiropractor and podiatrist, as related to goal of being able to exercise better w/o pain o HTN: telmisartan 40 mg daily;  o ASCVD risk reduction: atorvastatin 20 mg daily; last LDL at goal <70 o Peripheral neuropathy: gabapentin 400 mg TID; notes that she occasionally skips midday dose  because it makes her sleepy o Allergies: cetirizine 10 mg daily PRN, flonase nasal PRN o Supplements: black seed oil, Vitamin E, CoQ10, omega 3 fatty acids   Pharmacist Clinical Goal(s):  [name] Over the next 90 days, patient will work with PharmD and provider towards optimized medication management  Interventions: . Reviewed goal A1c, goal fasting glucose, and goal 2 hour post prandial readings.  . Collaboratively discussed w/ RN CM lifestyle management strategies  . Will follow up with patient on exercise and BG at next phone visit  Patient Self Care Activities:  . Patient will take medications as prescribed  Please see past updates related to this goal by clicking on the "Past Updates" button in the selected goal         Plan: - Will follow up with patient at scheduled phone call on 12/31/19  Catie Darnelle Maffucci, PharmD, [place] [phone]

## 2019-12-03 NOTE — Chronic Care Management (AMB) (Signed)
Chronic Care Management   Follow Up Note   12/03/2019 Name: Kim Thomas MRN: [id number] DOB: 04-May-1956  Referred by: Venita Lick, NP Reason for referral : No chief complaint on file.   Kim Thomas is a 64 y.o. year old female who is a primary care patient of Cannady, [name], NP. The CCM team was consulted for assistance with chronic disease management and care coordination needs.    Review of patient status, including review of consultants reports, relevant laboratory and other test results, and collaboration with appropriate care team members and the patient's provider was performed as part of comprehensive patient evaluation and provision of chronic care management services.    SDOH (Social Determinants of Health) screening performed today: Physical Activity. See Care Plan for related entries.   Outpatient Encounter Medications as of 12/03/2019  Medication Sig Note  . acetaminophen (TYLENOL) 500 MG tablet Take 500 mg by mouth daily as needed. 11/20/2019: Generally taking 500 mg BID, occasionally 1000 mg BID  . atorvastatin (LIPITOR) 20 MG tablet TAKE 1 TABLET BY MOUTH  DAILY   . BLACK CURRANT SEED OIL PO Take 1,250 mg by mouth 2 (two) times daily.   . Blood Glucose Monitoring Suppl (ONE TOUCH ULTRA MINI) w/Device KIT As directed   . cetirizine (ZYRTEC) 10 MG tablet Take 1 tablet (10 mg total) by mouth daily. 12/17/2018: As needed  . Cholecalciferol (VITAMIN D3) 2000 units TABS Take 2,000 Units by mouth daily.   [name] Co-Enzyme Q-10 100 MG CAPS Take 50 mg by mouth daily.   . cyclobenzaprine (FLEXERIL) 10 MG tablet Take 10 mg by mouth at bedtime. Take 1/2 tablet to 1 as needed  At bed time   . diclofenac sodium (VOLTAREN) 1 % GEL Apply topically.   . fluticasone (FLONASE) 50 MCG/ACT nasal spray Place 1-2 sprays into both nostrils 2 (two) times daily.   [name] gabapentin (NEURONTIN) 400 MG capsule Take 1 capsule (400 mg total) by mouth 3 (three) times daily.   [name] glipiZIDE (GLUCOTROL) 5 MG  tablet Take 0.5 tablets (2.5 mg total) by mouth daily before breakfast. 11/20/2019: Taking every other day currently  . glucose blood (ONE TOUCH ULTRA TEST) test strip USE TO CHECK BLOOD SUGAR  DAILY   . JARDIANCE 25 MG TABS tablet TAKE 1 TABLET BY MOUTH  DAILY   . Lancets (ONETOUCH DELICA PLUS [id number]) MISC 1 each by Does not apply route 2 (two) times daily.   . Multiple Vitamin (MULTIVITAMIN) tablet Take 1 tablet by mouth daily.   . Omega-3 1000 MG CAPS Take 1 capsule by mouth daily.   . ondansetron (ZOFRAN) 4 MG tablet Take 1 tablet (4 mg total) by mouth every 8 (eight) hours as needed for nausea or vomiting.   . Potassium 99 MG TABS Take by mouth.   . telmisartan (MICARDIS) 40 MG tablet Take 1 tablet by mouth once daily   . triamcinolone cream (KENALOG) 0.1 % Apply 1 application topically 2 (two) times daily.   . vitamin E 100 UNIT capsule Take 100 Units by mouth daily.    No facility-administered encounter medications on file as of 12/03/2019.     Goals Addressed            This Visit's Progress   . RN- I want to work towards being able to exercise (pt-stated)       Current Barriers:  . Lacks caregiver support- Patient moved here from [place]- sister lives in [place] and [place] still live out [place].  Manages her own healthcare. . Chronic Disease Management support and education needs related to HTN, DMII, CKD, and neuropathy.   Nurse Case Manager Clinical Goal(s):  [name] Over the next 90 days, patient will work with [facility] to address needs related to goals of returning to exercise for weight loss.   Interventions:  . Evaluation of current treatment plan related to DMII and patient's adherence to plan as established by provider. . Discussed plans with patient for ongoing care management follow up and provided patient with direct contact information for care management team . Patient stated she has been to the chiropractor and it was very helpful.  . She is working towards getting a  brace for leg.   . Patient stated she has a goal of losing 15lbs and would like to be able to get back to exercise but pain is hindering her at this time.  . Warm hand off to on boarding [facility] Pam.   Patient Self Care Activities:  . Self administers medications as prescribed . Attends all scheduled provider appointments . Performs ADL's independently . Performs IADL's independently . Patient unable to participate in exercise plan related to current pain  Initial goal documentation         The care management team will reach out to the patient again over the next 60 days.  The patient has been provided with contact information for the care management team and has been advised to call with any health related questions or concerns.    Merlene Morse Jolinda Pinkstaff RN, BSN Nurse Case Editor, [facility]ing [facility] Care Management  [phone]) Business Mobile

## 2019-12-04 ENCOUNTER — Telehealth: Payer: Self-pay | Admitting: *Deleted

## 2019-12-04 DIAGNOSIS — M6281 Muscle weakness (generalized): Secondary | ICD-10-CM

## 2019-12-04 DIAGNOSIS — Z9181 History of falling: Secondary | ICD-10-CM

## 2019-12-04 DIAGNOSIS — E1142 Type 2 diabetes mellitus with diabetic polyneuropathy: Secondary | ICD-10-CM

## 2019-12-04 LAB — VITAMIN D 25 HYDROXY (VIT D DEFICIENCY, FRACTURES): Vit D, 25-Hydroxy: 49.6 ng/mL (ref 30.0–100.0)

## 2019-12-04 LAB — BASIC METABOLIC PANEL
BUN/Creatinine Ratio: 16 (ref 12–28)
BUN: 19 mg/dL (ref 8–27)
CO2: 21 mmol/L (ref 20–29)
Calcium: 9.3 mg/dL (ref 8.7–10.3)
Chloride: 104 mmol/L (ref 96–106)
Creatinine, Ser: 1.2 mg/dL — ABNORMAL HIGH (ref 0.57–1.00)
GFR calc Af Amer: 56 mL/min/{1.73_m2} — ABNORMAL LOW (ref >59)
GFR calc non Af Amer: 48 mL/min/{1.73_m2} — ABNORMAL LOW (ref >59)
Glucose: 188 mg/dL — ABNORMAL HIGH (ref 65–99)
Potassium: 3.9 mmol/L (ref 3.5–5.2)
Sodium: 140 mmol/L (ref 134–144)

## 2019-12-04 LAB — LIPID PANEL W/O CHOL/HDL RATIO
Cholesterol, Total: 166 mg/dL (ref 100–199)
HDL: 64 mg/dL (ref >39)
LDL Chol Calc (NIH): 68 mg/dL (ref 0–99)
Triglycerides: 209 mg/dL — ABNORMAL HIGH (ref 0–149)
VLDL Cholesterol Cal: 34 mg/dL (ref 5–40)

## 2019-12-04 NOTE — Telephone Encounter (Signed)
Faxed required form, clinical 12/03/2019 - 12/29/2015, demographics to [facility].

## 2019-12-04 NOTE — Progress Notes (Signed)
Spoke to patient on telephone and discussed recent lab results, no changes at this time.

## 2019-12-04 NOTE — Telephone Encounter (Signed)
-----   Message from Candelaria Stagers, DPM sent at 12/03/2019  3:10 PM EST ----- Regarding: Referral to Ortho or neuro spine Hi Analleli Gierke,  Would you be able to put in a referral to orthopedic/neuro spine specialist to be evaluated for intermittent generalized lower extremity weakness of the left side.  Thanks Caryn Bee

## 2019-12-05 NOTE — Patient Instructions (Signed)
Thank you allowing the Chronic Care Management Team to be a part of your care! It was a pleasure speaking with you today!  CCM (Chronic Care Management) Team   Rakin Lemelle RN, BSN Nurse Care Coordinator  [phone]  Catie [facility] PharmD  Clinical Pharmacist  [phone]  Dickie La LCSW Clinical Social Worker [phone]  Goals Addressed            This Visit's Progress   . RN- I want to work towards being able to exercise (pt-stated)       Current Barriers:  . Lacks caregiver support- Patient moved here from [place]- sister lives in [place] and [place]. Manages her own healthcare. . Chronic Disease Management support and education needs related to HTN, DMII, CKD, and neuropathy.   Nurse Case Manager Clinical Goal(s):  [name] Over the next 90 days, patient will work with [facility] to address needs related to goals of returning to exercise for weight loss.   Interventions:  . Evaluation of current treatment plan related to DMII and patient's adherence to plan as established by provider. . Discussed plans with patient for ongoing care management follow up and provided patient with direct contact information for care management team . Patient stated she has been to the chiropractor and it was very helpful.  . She is working towards getting a brace for leg.   . Patient stated she has a goal of losing 15lbs and would like to be able to get back to exercise but pain is hindering her at this time.  . Warm hand off to on boarding [facility] Pam.   Patient Self Care Activities:  . Self administers medications as prescribed . Attends all scheduled provider appointments . Performs ADL's independently . Performs IADL's independently . Patient unable to participate in exercise plan related to current pain  Initial goal documentation        The patient verbalized understanding of instructions provided today and declined a print copy of patient instruction  materials.   The patient has been provided with contact information for the care management team and has been advised to call with any health related questions or concerns.

## 2019-12-10 ENCOUNTER — Other Ambulatory Visit: Payer: Self-pay | Admitting: Family Medicine

## 2019-12-13 ENCOUNTER — Other Ambulatory Visit: Payer: Self-pay

## 2019-12-13 DIAGNOSIS — M6281 Muscle weakness (generalized): Secondary | ICD-10-CM

## 2019-12-13 DIAGNOSIS — E1121 Type 2 diabetes mellitus with diabetic nephropathy: Secondary | ICD-10-CM

## 2019-12-13 NOTE — Progress Notes (Signed)
Patient called and requested to go to a Neurosurgeon at [facility] instead of Dr. Dutch Quint in [place].   New referral has been faxed to  [facility] clinic.

## 2019-12-17 ENCOUNTER — Ambulatory Visit: Payer: Medicare Other | Admitting: Orthotics

## 2019-12-17 ENCOUNTER — Other Ambulatory Visit: Payer: Self-pay

## 2019-12-17 DIAGNOSIS — M6281 Muscle weakness (generalized): Secondary | ICD-10-CM

## 2019-12-17 DIAGNOSIS — E1142 Type 2 diabetes mellitus with diabetic polyneuropathy: Secondary | ICD-10-CM

## 2019-12-17 DIAGNOSIS — Z9181 History of falling: Secondary | ICD-10-CM

## 2019-12-17 NOTE — Progress Notes (Signed)
Patient came in today for Eval/assessment for Moore Balance Brace.  Patient presents with a history of falling, and also fearful of falling.  Balance tests performed and patient does have issues keeping balance especially in foot to heel test.  Patient has week dorsiflexors and well as inverters/everters muscle group.  Demonstrants ankle instability.   

## 2019-12-17 NOTE — Progress Notes (Signed)
Patient is scheduled to see Ivar Drape, PA, on 12/20/2019 @  10am

## 2019-12-18 ENCOUNTER — Telehealth: Payer: Self-pay

## 2019-12-18 DIAGNOSIS — E1121 Type 2 diabetes mellitus with diabetic nephropathy: Secondary | ICD-10-CM

## 2019-12-18 NOTE — Telephone Encounter (Signed)
Patient called about financial concerns regarding boots (moore balance brace per note from [facility] yesterday 12/17/2019) she will have to wear. They are in the works of getting these made and was told it was going to be around 400 dollars after insurance. She was inquring if there was anything we knew of to help cover this cost or some of it. Sending referral to ccm and connected care to see if there are any resources available.

## 2019-12-20 ENCOUNTER — Other Ambulatory Visit: Payer: Self-pay | Admitting: Student

## 2019-12-20 DIAGNOSIS — M5416 Radiculopathy, lumbar region: Secondary | ICD-10-CM

## 2019-12-20 DIAGNOSIS — R29898 Other symptoms and signs involving the musculoskeletal system: Secondary | ICD-10-CM

## 2019-12-20 DIAGNOSIS — R2689 Other abnormalities of gait and mobility: Secondary | ICD-10-CM

## 2019-12-23 ENCOUNTER — Telehealth: Payer: Self-pay | Admitting: Nurse Practitioner

## 2019-12-23 NOTE — Chronic Care Management (AMB) (Signed)
  Chronic Care Management   Note  12/23/2019 Name: Kim Thomas MRN: [id number] DOB: 11-28-1956  Kim Thomas is a 64 y.o. year old female who is a primary care patient of Cannady, Dorie Rank, NP. Kim Thomas is currently enrolled in care management services. An additional referral for RN Case manager was placed.   Follow up plan: Telephone appointment with care management team member scheduled for:01/17/2020  Kim Ponder, LPN Health Advisor, Embedded Care Coordination [facility] Health Care Management ??[name] ??[phone]

## 2019-12-25 ENCOUNTER — Telehealth: Payer: Self-pay

## 2019-12-31 ENCOUNTER — Other Ambulatory Visit: Payer: Self-pay | Admitting: Nurse Practitioner

## 2019-12-31 ENCOUNTER — Ambulatory Visit (INDEPENDENT_AMBULATORY_CARE_PROVIDER_SITE_OTHER): Payer: Medicare Other | Admitting: Pharmacist

## 2019-12-31 ENCOUNTER — Telehealth: Payer: Self-pay | Admitting: Nurse Practitioner

## 2019-12-31 DIAGNOSIS — I1310 Hypertensive heart and chronic kidney disease without heart failure, with stage 1 through stage 4 chronic kidney disease, or unspecified chronic kidney disease: Secondary | ICD-10-CM

## 2019-12-31 DIAGNOSIS — E1121 Type 2 diabetes mellitus with diabetic nephropathy: Secondary | ICD-10-CM

## 2019-12-31 MED ORDER — OMEPRAZOLE 20 MG PO CPDR: 20.0000 mg | DELAYED_RELEASE_CAPSULE | Freq: Every day | ORAL | 3 refills | Status: DC

## 2019-12-31 NOTE — Telephone Encounter (Signed)
    Called pt regarding [employer] Referral for financial assistance. Patient is a 64 y.o. female  and is under the care of Cannady, Dorie Rank, NP Pt was fitted for [employer] by Ria Clock, CPED COF at [facility]. Pt stated her co-pay for the braces will be close to $400 she will have $100 to put towards them on the day she picks them up. Advised her to call [facility] to let them know that they will need to bill her for the remaining portion of the braces and that she should call [employer] to set up payment plan to pay down remaining $300 of co-payment. Provided my direct # should she need anything further. Closing referral pending any other needs of patient.  Manuela Schwartz  Care Guide . Embedded Care Coordination [place]  Care Management ??[email or web address]  ??[phone]

## 2019-12-31 NOTE — Chronic Care Management (AMB) (Signed)
Chronic Care Management   Follow Up Note   12/31/2019 Name: Kim Thomas MRN: [id number] DOB: 1956-06-07  Referred by: Venita Lick, NP Reason for referral : Chronic Care Management (Medication Management)   Kim Thomas is a 64 y.o. year old female who is a primary care patient of Cannady, [name], NP. The CCM team was consulted for assistance with chronic disease management and care coordination needs.    Contacted patient for medication management review.   Review of patient status, including review of consultants reports, relevant laboratory and other test results, and collaboration with appropriate care team members and the patient's provider was performed as part of comprehensive patient evaluation and provision of chronic care management services.    SDOH (Social Determinants of Health) screening performed today: Financial Strain . See Care Plan for related entries.   Outpatient Encounter Medications as of 12/31/2019  Medication Sig Note  . Blood Glucose Monitoring Suppl (ONE TOUCH ULTRA MINI) w/Device KIT As directed   . glipiZIDE (GLUCOTROL) 5 MG tablet Take 0.5 tablets (2.5 mg total) by mouth daily before breakfast.   . JARDIANCE 25 MG TABS tablet TAKE 1 TABLET BY MOUTH  DAILY   . acetaminophen (TYLENOL) 500 MG tablet Take 500 mg by mouth daily as needed. 11/20/2019: Generally taking 500 mg BID, occasionally 1000 mg BID  . atorvastatin (LIPITOR) 20 MG tablet TAKE 1 TABLET BY MOUTH  DAILY   . BLACK CURRANT SEED OIL PO Take 1,250 mg by mouth 2 (two) times daily.   . cetirizine (ZYRTEC) 10 MG tablet Take 1 tablet by mouth once daily   . Cholecalciferol (VITAMIN D3) 2000 units TABS Take 2,000 Units by mouth daily.   [name] Co-Enzyme Q-10 100 MG CAPS Take 50 mg by mouth daily.   . cyclobenzaprine (FLEXERIL) 10 MG tablet Take 10 mg by mouth at bedtime. Take 1/2 tablet to 1 as needed  At bed time   . diclofenac sodium (VOLTAREN) 1 % GEL Apply topically.   . fluticasone (FLONASE) 50  MCG/ACT nasal spray Place 1-2 sprays into both nostrils 2 (two) times daily.   [name] gabapentin (NEURONTIN) 400 MG capsule Take 1 capsule (400 mg total) by mouth 3 (three) times daily.   [name] glucose blood (ONE TOUCH ULTRA TEST) test strip USE TO CHECK BLOOD SUGAR  DAILY   . Lancets (ONETOUCH DELICA PLUS [id number]) MISC 1 each by Does not apply route 2 (two) times daily.   . Multiple Vitamin (MULTIVITAMIN) tablet Take 1 tablet by mouth daily.   . Omega-3 1000 MG CAPS Take 1 capsule by mouth daily.   . ondansetron (ZOFRAN) 4 MG tablet Take 1 tablet (4 mg total) by mouth every 8 (eight) hours as needed for nausea or vomiting.   . Potassium 99 MG TABS Take by mouth.   . telmisartan (MICARDIS) 40 MG tablet Take 1 tablet by mouth once daily   . triamcinolone cream (KENALOG) 0.1 % Apply 1 application topically 2 (two) times daily.   . vitamin E 100 UNIT capsule Take 100 Units by mouth daily.    No facility-administered encounter medications on file as of 12/31/2019.     Objective:   Goals Addressed            This Visit's Progress     Patient Stated   . PharmD "I want to stay healthy" (pt-stated)       Current Barriers:  . Polypharmacy; complex patient with multiple comorbidities including T2DM, CKD, peripheral neuropathy, allergies o  Reports 2 episodes last week: sensation of a "thing" sticking in her throat; took "activated charcoal tablet", but 2 tabs resulted in nausea/vomiting and vomiting episode caused urinary incontinence;  o Reports "knot in throat that won't go down or won't come up". Denies burning.  o Reports this has happened occasionally historically, but more frequently recently. Describes receiving an EGD that just showed severe acid reflux. Had been on Prilosec before. Notes that she stopped "a while ago" because she didn't think she needed anymore, and wasn't having acid reflux issues . Self-manages medications o T2DM: Most recent A1c 7.5%, currently taking Jardiance 25 mg daily;  glipizide 2.5 mg every day  - Glucose readings: [place] 100s if she checks before drinking coffee + sweet and low, but ~120s if she checks after coffee; Bedtime: if she's had starch, readings ~140-150s; if has eaten better  - Exercise: has been working w/ Scientist, water quality to work on mobility and exercise. Notes that she was fitted for boots that are going to be >$400. Care Guide referral placed by [facility] LPN on 5/78/46 o HTN: telmisartan 40 mg daily;  o ASCVD risk reduction: atorvastatin 20 mg daily; last LDL at goal <70 o Peripheral neuropathy: gabapentin 400 mg TID; notes that she occasionally skips midday dose because it makes her sleepy o Allergies: cetirizine 10 mg daily PRN, flonase nasal PRN o Supplements: black seed oil, Vitamin E, CoQ10, omega 3 fatty acids    Pharmacist Clinical Goal(s):  [name] Over the next 90 days, patient will work with PharmD and provider towards optimized medication management  Interventions: . Will relay above symptoms to [employer]. Discussed with patient that she may want her to see GI. Patient notes she would prefer to try a treatment (eg PPI course) before another specialist referral, but will defer to PCP recommendation . Will outreach Care Guide to notify that patient is awaiting her call for financial support.   Patient Self Care Activities:  . Patient will take medications as prescribed  Please see past updates related to this goal by clicking on the "Past Updates" button in the selected goal          Plan:  - Scheduled f/u call 02/18/20  Catie Darnelle Maffucci, PharmD, [place] [phone]

## 2019-12-31 NOTE — Chronic Care Management (AMB) (Signed)
  Chronic Care Management   Note  12/31/2019 Name: Kim Thomas MRN: [id number] DOB: Nov 12, 1956  Kim Thomas is a 64 y.o. year old female who is a primary care patient of Cannady, Dorie Rank, NP. The CCM team was consulted for assistance with chronic disease management and care coordination needs.    Called patient back, counseled on prescription for omeprazole that was sent to [facility] by PCP. Patient aware this is a trial course for a few months, not to stop abruptly, and to contact PCP office for further work up if issues persist on PPI therapy. Patient has PCP f/u scheduled 03/04/20.   Catie Feliz Beam, PharmD, [facility] Clinical Pharmacist [facility] [phone]

## 2019-12-31 NOTE — Progress Notes (Signed)
ilo

## 2019-12-31 NOTE — Patient Instructions (Signed)
Visit Information  Goals Addressed            This Visit's Progress     Patient Stated   . PharmD "I want to stay healthy" (pt-stated)       Current Barriers:  . Polypharmacy; complex patient with multiple comorbidities including T2DM, CKD, peripheral neuropathy, allergies o Reports 2 episodes last week: sensation of a "thing" sticking in her throat; took "activated charcoal tablet", but 2 tabs resulted in nausea/vomiting and vomiting episode caused urinary incontinence;  o Reports "knot in throat that won't go down or won't come up". Denies burning.  o Reports this has happened occasionally historically, but more frequently recently. Describes receiving an EGD that just showed severe acid reflux. Had been on Prilosec before. Notes that she stopped "a while ago" because she didn't think she needed anymore, and wasn't having acid reflux issues . Self-manages medications o T2DM: Most recent A1c 7.5%, currently taking Jardiance 25 mg daily; glipizide 2.5 mg every day  - Glucose readings: [place] 100s if she checks before drinking coffee + sweet and low, but ~120s if she checks after coffee; Bedtime: if she's had starch, readings ~140-150s; if has eaten better  - Exercise: has been working w/ Higher education careers adviser to work on mobility and exercise. Notes that she was fitted for boots that are going to be >$400. Care Guide referral placed by [facility] LPN on 9/47/09 o HTN: telmisartan 40 mg daily;  o ASCVD risk reduction: atorvastatin 20 mg daily; last LDL at goal <70 o Peripheral neuropathy: gabapentin 400 mg TID; notes that she occasionally skips midday dose because it makes her sleepy o Allergies: cetirizine 10 mg daily PRN, flonase nasal PRN o Supplements: black seed oil, Vitamin E, CoQ10, omega 3 fatty acids    Pharmacist Clinical Goal(s):  [name] Over the next 90 days, patient will work with PharmD and provider towards optimized medication management  Interventions: . Will relay above  symptoms to [employer]. Discussed with patient that she may want her to see GI. Patient notes she would prefer to try a treatment (eg PPI course) before another specialist referral, but will defer to PCP recommendation . Will outreach Care Guide to notify that patient is awaiting her call for financial support.   Patient Self Care Activities:  . Patient will take medications as prescribed  Please see past updates related to this goal by clicking on the "Past Updates" button in the selected goal         The patient verbalized understanding of instructions provided today and declined a print copy of patient instruction materials.    Plan:  - Scheduled f/u call 02/18/20  Catie Feliz Beam, PharmD, [facility] Clinical Pharmacist [facility] [phone]

## 2020-01-01 ENCOUNTER — Ambulatory Visit
Admission: RE | Admit: 2020-01-01 | Discharge: 2020-01-01 | Disposition: A | Discharge status: Home / Self Care | Payer: Medicare Other | Source: Ambulatory Visit | Attending: Student | Admitting: Student

## 2020-01-01 ENCOUNTER — Other Ambulatory Visit: Payer: Self-pay

## 2020-01-01 DIAGNOSIS — M5416 Radiculopathy, lumbar region: Secondary | ICD-10-CM | POA: Insufficient documentation

## 2020-01-01 DIAGNOSIS — R29898 Other symptoms and signs involving the musculoskeletal system: Secondary | ICD-10-CM | POA: Diagnosis present

## 2020-01-01 DIAGNOSIS — R2689 Other abnormalities of gait and mobility: Secondary | ICD-10-CM | POA: Insufficient documentation

## 2020-01-02 ENCOUNTER — Other Ambulatory Visit: Payer: Self-pay | Admitting: Nurse Practitioner

## 2020-01-02 ENCOUNTER — Encounter: Payer: Self-pay | Admitting: Nurse Practitioner

## 2020-01-02 ENCOUNTER — Other Ambulatory Visit: Payer: Self-pay

## 2020-01-02 DIAGNOSIS — E042 Nontoxic multinodular goiter: Secondary | ICD-10-CM

## 2020-01-02 NOTE — Addendum Note (Signed)
Addended by: Aura Dials T on: 01/02/2020 03:30 PM   Modules accepted: Orders

## 2020-01-02 NOTE — Progress Notes (Signed)
Reviewed recent spinal MRI from neurosurgery and thoracic imaging noted "enlarged multinodular thyroid gland, incompletely evaluated. Recommend further evaluation with thyroid ultrasound.".  Discussed with patient, she has not had thyroid labs recently.  Will order these outpatient and order thyroid u/s.  She appreciate call.

## 2020-01-03 ENCOUNTER — Ambulatory Visit (INDEPENDENT_AMBULATORY_CARE_PROVIDER_SITE_OTHER): Payer: Medicare Other | Admitting: Orthotics

## 2020-01-03 ENCOUNTER — Other Ambulatory Visit: Payer: Self-pay

## 2020-01-03 ENCOUNTER — Other Ambulatory Visit: Payer: Medicare Other

## 2020-01-03 DIAGNOSIS — M6281 Muscle weakness (generalized): Secondary | ICD-10-CM

## 2020-01-03 DIAGNOSIS — Z9181 History of falling: Secondary | ICD-10-CM

## 2020-01-03 DIAGNOSIS — E1142 Type 2 diabetes mellitus with diabetic polyneuropathy: Secondary | ICD-10-CM | POA: Diagnosis not present

## 2020-01-03 DIAGNOSIS — E042 Nontoxic multinodular goiter: Secondary | ICD-10-CM

## 2020-01-04 LAB — THYROID PANEL WITH TSH
Free Thyroxine Index: 2.2 (ref 1.2–4.9)
T3 Uptake Ratio: 28 % (ref 24–39)
T4, Total: 7.8 ug/dL (ref 4.5–12.0)
TSH: 1.36 u[IU]/mL (ref 0.450–4.500)

## 2020-01-04 NOTE — Progress Notes (Signed)
Contacted via MyChart

## 2020-01-09 ENCOUNTER — Other Ambulatory Visit: Payer: Self-pay

## 2020-01-09 ENCOUNTER — Ambulatory Visit
Admission: RE | Admit: 2020-01-09 | Discharge: 2020-01-09 | Disposition: A | Discharge status: Home / Self Care | Payer: Medicare Other | Source: Ambulatory Visit | Attending: Nurse Practitioner | Admitting: Nurse Practitioner

## 2020-01-09 DIAGNOSIS — E042 Nontoxic multinodular goiter: Secondary | ICD-10-CM | POA: Diagnosis present

## 2020-01-10 NOTE — Progress Notes (Signed)
Spoke to patient on telephone and discussed lab results, will obtain TPO antibodies testing next visit.  She denied any concerns and all questions answered.

## 2020-01-17 ENCOUNTER — Telehealth: Payer: Medicare Other | Admitting: General Practice

## 2020-01-17 ENCOUNTER — Ambulatory Visit: Payer: Self-pay | Admitting: General Practice

## 2020-01-17 DIAGNOSIS — N183 Chronic kidney disease, stage 3 unspecified: Secondary | ICD-10-CM

## 2020-01-17 DIAGNOSIS — I1310 Hypertensive heart and chronic kidney disease without heart failure, with stage 1 through stage 4 chronic kidney disease, or unspecified chronic kidney disease: Secondary | ICD-10-CM

## 2020-01-17 DIAGNOSIS — E1169 Type 2 diabetes mellitus with other specified complication: Secondary | ICD-10-CM

## 2020-01-17 DIAGNOSIS — E1121 Type 2 diabetes mellitus with diabetic nephropathy: Secondary | ICD-10-CM

## 2020-01-17 DIAGNOSIS — E785 Hyperlipidemia, unspecified: Secondary | ICD-10-CM

## 2020-01-17 DIAGNOSIS — N1831 Chronic kidney disease, stage 3a: Secondary | ICD-10-CM | POA: Diagnosis not present

## 2020-01-17 NOTE — Patient Instructions (Signed)
Visit Information  Goals Addressed            This Visit's Progress   . RN- I want to work towards being able to exercise (pt-stated)       Current Barriers:  . Lacks caregiver support- Patient moved here from [place]- sister lives in [place] and [place] still live out [place]. Manages her own healthcare. . Chronic Disease Management support and education needs related to HTN, DMII, CKD, and neuropathy.   Nurse Case Manager Clinical Goal(s):  [name] Over the next 90 days, patient will work with [facility] to address needs related to goals of returning to exercise for weight loss.   Interventions:  . Evaluation of current treatment plan related to DMII and patient's adherence to plan as established by provider. . Discussed plans with patient for ongoing care management follow up and provided patient with direct contact information for care management team . Evaluation of working with chiropractor-Patient stated she has been to the chiropractor and it was very helpful. The patient states she is down to one visit a month and feels this will stop soon as she is doing well . Evaluation of medical boots to assist with ambulation. Patient confirms she has the boots but has not been able to get out and use them a lot due to the bad weather. She is hopeful soon she can get out and do more activity. Education on 15 to 20 minutes exercise at least 3 or 4 times a week can be beneficial in reducing blood sugars and help her overall health and well being.  . Education and support on the benefits of good dietary choices and exercising in helping control her DM and other chronic health conditions.  . Review and evaluation of upcoming appointments with provider: See pcp on 03-04-2020 at 0800am   Patient Self Care Activities:  . Self administers medications as prescribed . Attends all scheduled provider appointments . Performs ADL's independently . Performs IADL's independently . Patient unable to participate in  exercise plan related to current pain  Please see past updates related to this goal by clicking on the "Past Updates" button in the selected goal      . RN- Managing chronic diseases (pt-stated)       Current Barriers:  . Chronic Disease Management support, education, and care coordination needs related to HTN and DMII  Clinical Goal(s) related to HTN and DMII:  Over the next 90 days, patient will:  . Work with the care management team to address educational, disease management, and care coordination needs  . Begin or continue self health monitoring activities as directed today Measure and record cbg (blood glucose) x1 times daily and Measure and record blood pressure 4 times per week . Call provider office for new or worsened signs and symptoms Blood glucose findings outside established parameters . Call care management team with questions or concerns . Verbalize basic understanding of patient centered plan of care established today  Interventions related to HTN and DMII:  . Evaluation of current treatment plans and patient's adherence to plan as established by provider . Assessed patient understanding of disease states- the patient desires to have a lower A1C when she goes back to the provider in April. She is being mindful of her dietary intake. Education and support . Assessed patient's education and care coordination needs . Provided disease specific education to patient  . Collaborated with appropriate clinical care team members regarding patient needs . Education on the OTC benefit through  [employer] and the monitoring program through [facility] where she can get a blood pressure cuff to use to monitor her blood pressures and record on a daily basis- the patient currently does not have a blood pressure cuff, but is aware of the over the counter benefit, the housecalls benefit and has the life alert system   Patient Self Care Activities related to HTN and DMII:  . Patient is unable to independently  self-manage chronic health conditions  Please see past updates related to this goal by clicking on the "Past Updates" button in the selected goal         Patient verbalizes understanding of instructions provided today.   Telephone follow up appointment with care management team member scheduled for: 03-10-2020  Alto Denver RN, MSN, CCM Community Care Coordinator [place]  [facility] Mobile: [phone]

## 2020-01-17 NOTE — Chronic Care Management (AMB) (Signed)
Chronic Care Management   Follow Up Note   01/17/2020 Name: Kim Thomas MRN: [id number] DOB: July 12, 1956  Referred by: Venita Lick, NP Reason for referral : Chronic Care Management (Follow up on Medical boots and Chronic Medical conditions)   Kim Thomas is a 64 y.o. year old female who is a primary care patient of Cannady, [name], NP. The CCM team was consulted for assistance with chronic disease management and care coordination needs.    Review of patient status, including review of consultants reports, relevant laboratory and other test results, and collaboration with appropriate care team members and the patient's provider was performed as part of comprehensive patient evaluation and provision of chronic care management services.    SDOH (Social Determinants of Health) assessments performed: Yes SDOH Interventions     Most Recent Value  SDOH Interventions  SDOH Interventions for the Following Domains  Physical Activity  Physical Activity Interventions  Other (Comments) [patient sometimes cannot get to exercise due to the weather and her boots, but is doing better]       Outpatient Encounter Medications as of 01/17/2020  Medication Sig Note  . acetaminophen (TYLENOL) 500 MG tablet Take 500 mg by mouth daily as needed. 11/20/2019: Generally taking 500 mg BID, occasionally 1000 mg BID  . atorvastatin (LIPITOR) 20 MG tablet TAKE 1 TABLET BY MOUTH  DAILY   . BLACK CURRANT SEED OIL PO Take 1,250 mg by mouth 2 (two) times daily.   . Blood Glucose Monitoring Suppl (ONE TOUCH ULTRA MINI) w/Device KIT As directed   . cetirizine (ZYRTEC) 10 MG tablet Take 1 tablet by mouth once daily   . Cholecalciferol (VITAMIN D3) 2000 units TABS Take 2,000 Units by mouth daily.   [name] Co-Enzyme Q-10 100 MG CAPS Take 50 mg by mouth daily.   . cyclobenzaprine (FLEXERIL) 10 MG tablet Take 10 mg by mouth at bedtime. Take 1/2 tablet to 1 as needed  At bed time   . diclofenac sodium (VOLTAREN) 1 % GEL  Apply topically.   . fluticasone (FLONASE) 50 MCG/ACT nasal spray Place 1-2 sprays into both nostrils 2 (two) times daily.   [name] gabapentin (NEURONTIN) 400 MG capsule Take 1 capsule (400 mg total) by mouth 3 (three) times daily.   [name] glipiZIDE (GLUCOTROL) 5 MG tablet Take 0.5 tablets (2.5 mg total) by mouth daily before breakfast.   . glucose blood (ONE TOUCH ULTRA TEST) test strip USE TO CHECK BLOOD SUGAR  DAILY   . JARDIANCE 25 MG TABS tablet TAKE 1 TABLET BY MOUTH  DAILY   . Lancets (ONETOUCH DELICA PLUS [id number]) MISC 1 each by Does not apply route 2 (two) times daily.   . Multiple Vitamin (MULTIVITAMIN) tablet Take 1 tablet by mouth daily.   . Omega-3 1000 MG CAPS Take 1 capsule by mouth daily.   [name] omeprazole (PRILOSEC) 20 MG capsule Take 1 capsule (20 mg total) by mouth daily.   . ondansetron (ZOFRAN) 4 MG tablet Take 1 tablet (4 mg total) by mouth every 8 (eight) hours as needed for nausea or vomiting.   . Potassium 99 MG TABS Take by mouth.   . telmisartan (MICARDIS) 40 MG tablet Take 1 tablet by mouth once daily   . triamcinolone cream (KENALOG) 0.1 % Apply 1 application topically 2 (two) times daily.   . vitamin E 100 UNIT capsule Take 100 Units by mouth daily.    No facility-administered encounter medications on file as of 01/17/2020.     Objective:  Lab Results  Component Value Date   HGBA1C 7.5 (H) 12/03/2019   BP Readings from Last 3 Encounters:  12/03/19 130/75  08/28/19 109/66  08/14/19 112/78    Goals Addressed            This Visit's Progress   . RN- I want to work towards being able to exercise (pt-stated)       Current Barriers:  . Lacks caregiver support- Patient moved here from [place]- sister lives in [place] and [place] still live out [place]. Manages her own healthcare. . Chronic Disease Management support and education needs related to HTN, DMII, CKD, and neuropathy.   Nurse Case Manager Clinical Goal(s):  [name] Over the next 90 days, patient will work  with [facility] to address needs related to goals of returning to exercise for weight loss.   Interventions:  . Evaluation of current treatment plan related to DMII and patient's adherence to plan as established by provider. . Discussed plans with patient for ongoing care management follow up and provided patient with direct contact information for care management team . Evaluation of working with chiropractor-Patient stated she has been to the chiropractor and it was very helpful. The patient states she is down to one visit a month and feels this will stop soon as she is doing well . Evaluation of medical boots to assist with ambulation. Patient confirms she has the boots but has not been able to get out and use them a lot due to the bad weather. She is hopeful soon she can get out and do more activity. Education on 15 to 20 minutes exercise at least 3 or 4 times a week can be beneficial in reducing blood sugars and help her overall health and well being.  . Education and support on the benefits of good dietary choices and exercising in helping control her DM and other chronic health conditions.  . Review and evaluation of upcoming appointments with provider: See pcp on 03-04-2020 at 0800am   Patient Self Care Activities:  . Self administers medications as prescribed . Attends all scheduled provider appointments . Performs ADL's independently . Performs IADL's independently . Patient unable to participate in exercise plan related to current pain  Please see past updates related to this goal by clicking on the "Past Updates" button in the selected goal      . RN- Managing chronic diseases (pt-stated)       Current Barriers:  . Chronic Disease Management support, education, and care coordination needs related to HTN and DMII  Clinical Goal(s) related to HTN and DMII:  Over the next 90 days, patient will:  . Work with the care management team to address educational, disease management, and care  coordination needs  . Begin or continue self health monitoring activities as directed today Measure and record cbg (blood glucose) x1 times daily and Measure and record blood pressure 4 times per week . Call provider office for new or worsened signs and symptoms Blood glucose findings outside established parameters . Call care management team with questions or concerns . Verbalize basic understanding of patient centered plan of care established today  Interventions related to HTN and DMII:  . Evaluation of current treatment plans and patient's adherence to plan as established by provider . Assessed patient understanding of disease states- the patient desires to have a lower A1C when she goes back to the provider in April. She is being mindful of her dietary intake. Education and support . Assessed patient's education  and care coordination needs . Provided disease specific education to patient  . Collaborated with appropriate clinical care team members regarding patient needs . Education on the OTC benefit through [facility] and the monitoring program through [facility] where she can get a blood pressure cuff to use to monitor her blood pressures and record on a daily basis- the patient currently does not have a blood pressure cuff, but is aware of the over the counter benefit, the housecalls benefit and has the life alert system   Patient Self Care Activities related to HTN and DMII:  . Patient is unable to independently self-manage chronic health conditions  Please see past updates related to this goal by clicking on the "Past Updates" button in the selected goal          Plan:   Telephone follow up appointment with care management team member scheduled for:03-10-2020 at 0900 am   [place], MSN, [facility] Mobile: [phone]

## 2020-01-28 ENCOUNTER — Ambulatory Visit
Admission: RE | Admit: 2020-01-28 | Discharge: 2020-01-28 | Disposition: A | Discharge status: Home / Self Care | Payer: Medicare Other | Source: Ambulatory Visit | Attending: Nurse Practitioner | Admitting: Nurse Practitioner

## 2020-01-28 ENCOUNTER — Other Ambulatory Visit: Payer: Self-pay | Admitting: Family Medicine

## 2020-01-28 DIAGNOSIS — Z780 Asymptomatic menopausal state: Secondary | ICD-10-CM | POA: Insufficient documentation

## 2020-01-28 DIAGNOSIS — M85852 Other specified disorders of bone density and structure, left thigh: Secondary | ICD-10-CM | POA: Diagnosis not present

## 2020-01-28 DIAGNOSIS — Z1231 Encounter for screening mammogram for malignant neoplasm of breast: Secondary | ICD-10-CM | POA: Insufficient documentation

## 2020-01-28 DIAGNOSIS — Z13820 Encounter for screening for osteoporosis: Secondary | ICD-10-CM | POA: Insufficient documentation

## 2020-01-28 DIAGNOSIS — M8580 Other specified disorders of bone density and structure, unspecified site: Secondary | ICD-10-CM

## 2020-01-28 NOTE — Progress Notes (Signed)
Contacted via MyChart

## 2020-01-30 ENCOUNTER — Other Ambulatory Visit: Payer: Self-pay

## 2020-01-30 ENCOUNTER — Other Ambulatory Visit: Payer: Self-pay | Admitting: Nurse Practitioner

## 2020-01-30 ENCOUNTER — Ambulatory Visit: Payer: Medicare Other | Admitting: Podiatry

## 2020-01-30 DIAGNOSIS — E1142 Type 2 diabetes mellitus with diabetic polyneuropathy: Secondary | ICD-10-CM | POA: Diagnosis not present

## 2020-01-30 DIAGNOSIS — Z9181 History of falling: Secondary | ICD-10-CM | POA: Diagnosis not present

## 2020-01-30 DIAGNOSIS — M778 Other enthesopathies, not elsewhere classified: Secondary | ICD-10-CM

## 2020-01-30 DIAGNOSIS — M6281 Muscle weakness (generalized): Secondary | ICD-10-CM

## 2020-01-30 MED ORDER — GLIPIZIDE 5 MG PO TABS: 2.5000 mg | ORAL_TABLET | Freq: Every day | ORAL | 11 refills | Status: DC

## 2020-01-30 NOTE — Telephone Encounter (Signed)
Requested Prescriptions  Pending Prescriptions Disp Refills  . ONETOUCH ULTRA test strip [employer] Med Name: T/S ONE TOUCH ULTRA BLUE] 100 strip 3    Sig: USE TO CHECK BLOOD SUGAR  DAILY     Endocrinology: Diabetes - Testing Supplies Passed - 01/30/2020  9:22 AM      Passed - Valid encounter within last 12 months    Recent Outpatient Visits          1 month ago Type 2 diabetes mellitus with diabetic nephropathy, without long-term current use of insulin (HCC)   [facility] [place], Jolene T, NP   5 months ago Type 2 diabetes mellitus with diabetic nephropathy, without long-term current use of insulin (HCC)   [facility] [place], Jolene T, NP   8 months ago Type 2 diabetes mellitus with diabetic nephropathy, without long-term current use of insulin (HCC)   [facility] [place], Norene T, NP   1 year ago Type 2 diabetes mellitus with diabetic nephropathy, without long-term current use of insulin (HCC)   [facility] [place], Jud T, NP   1 year ago Type 2 diabetes mellitus with diabetic nephropathy, without long-term current use of insulin (HCC)   [facility] [place], Dorie Rank, NP      Future Appointments            In 1 month Cannady, Dorie Rank, NP [employer], [facility]

## 2020-01-30 NOTE — Telephone Encounter (Signed)
Refill request for Glipizide 5mg  LOV: 12/03/2019 Next Appt: 03/04/2020

## 2020-01-31 ENCOUNTER — Encounter: Payer: Self-pay | Admitting: Podiatry

## 2020-01-31 NOTE — Progress Notes (Signed)
Subjective:  Patient ID: [name] Overall, female    DOB: 09-Feb-1956,  MRN: [id number]  Chief Complaint  Patient presents with  . Diabetes    pt is here for a f/u on polyneuropathy, pt states that she is feeling more of a soreness sensation than actual pain, pt also states that the braces she has recieved last time has been helping as well.    64 y.o. female presents with the above complaint.  64 y.o. female presents with the above complaint.  Patient presents with a follow-up of generalized left lower extremity.  Patient states the left side has been doing much better.  She has been able to gain some of her strength back to the left side.  Patient was able to obtain [facility] balance bracing that has been able to to let her ambulate without constant feeling of falling down.  She has not had a fall since after obtaining the balance bracing.  She now has a new complaint on the right foot submetatarsal 5 pain.  Patient states the pain started after wearing the balance bracing.  It appears that the pain is getting worse because of excessive pressure to the submetatarsal 5.  Patient states that there is more soreness and sensation since the last time she was here.  She has been seen by spine specialist at [facility].  She has mild stenosis of the lumbar region but nothing that is causing impingement to the left lower extremity.  Therefore no surgical intervention will be needed at this time.  Patient denies any other complaints   Review of Systems: Negative except as noted in the HPI. Denies N/V/F/Ch.  Past Medical History:  Diagnosis Date  . Anxiety   . Chronic kidney disease   . DDD (degenerative disc disease), cervical   . Diabetes mellitus without complication ([place])   . Gout   . Hypertension   . Sleep apnea     Current Outpatient Medications:  .  acetaminophen (TYLENOL) 500 MG tablet, Take 500 mg by mouth daily as needed., Disp: , Rfl:  .  atorvastatin (LIPITOR) 20 MG tablet, TAKE 1 TABLET BY  MOUTH  DAILY, Disp: 90 tablet, Rfl: 3 .  BLACK CURRANT SEED OIL PO, Take 1,250 mg by mouth 2 (two) times daily., Disp: , Rfl:  .  Blood Glucose Monitoring Suppl (ONE TOUCH ULTRA MINI) w/Device KIT, As directed, Disp: 1 kit, Rfl: 2 .  cetirizine (ZYRTEC) 10 MG tablet, Take 1 tablet by mouth once daily, Disp: 90 tablet, Rfl: 0 .  Cholecalciferol (VITAMIN D3) 2000 units TABS, Take 2,000 Units by mouth daily., Disp: , Rfl:  .  Co-Enzyme Q-10 100 MG CAPS, Take 50 mg by mouth daily., Disp: , Rfl:  .  cyclobenzaprine (FLEXERIL) 10 MG tablet, Take 10 mg by mouth at bedtime. Take 1/2 tablet to 1 as needed  At bed time, Disp: , Rfl:  .  diclofenac sodium (VOLTAREN) 1 % GEL, Apply topically., Disp: , Rfl:  .  fluticasone (FLONASE) 50 MCG/ACT nasal spray, USE 1 TO 2 SPRAY(S) IN EACH NOSTRIL TWICE DAILY, Disp: 16 g, Rfl: 0 .  gabapentin (NEURONTIN) 400 MG capsule, Take 1 capsule (400 mg total) by mouth 3 (three) times daily., Disp: 270 capsule, Rfl: 2 .  glipiZIDE (GLUCOTROL) 5 MG tablet, Take 0.5 tablets (2.5 mg total) by mouth daily before breakfast., Disp: 30 tablet, Rfl: 11 .  JARDIANCE 25 MG TABS tablet, TAKE 1 TABLET BY MOUTH  DAILY, Disp: 90 tablet, Rfl: 3 .  Lancets (ONETOUCH DELICA PLUS [id number]) MISC, 1 each by Does not apply route 2 (two) times daily., Disp: 100 each, Rfl: 12 .  Multiple Vitamin (MULTIVITAMIN) tablet, Take 1 tablet by mouth daily., Disp: , Rfl:  .  Omega-3 1000 MG CAPS, Take 1 capsule by mouth daily., Disp: , Rfl:  .  omeprazole (PRILOSEC) 20 MG capsule, Take 1 capsule (20 mg total) by mouth daily., Disp: 30 capsule, Rfl: 3 .  ondansetron (ZOFRAN) 4 MG tablet, Take 1 tablet (4 mg total) by mouth every 8 (eight) hours as needed for nausea or vomiting., Disp: 20 tablet, Rfl: 0 .  ONETOUCH ULTRA test strip, USE TO CHECK BLOOD SUGAR  DAILY, Disp: 100 strip, Rfl: 3 .  Potassium 99 MG TABS, Take by mouth., Disp: , Rfl:  .  telmisartan (MICARDIS) 40 MG tablet, Take 1 tablet by mouth once  daily, Disp: 90 tablet, Rfl: 3 .  triamcinolone cream (KENALOG) 0.1 %, Apply 1 application topically 2 (two) times daily., Disp: 30 g, Rfl: 2 .  vitamin E 100 UNIT capsule, Take 100 Units by mouth daily., Disp: , Rfl:   Social History   Tobacco Use  Smoking Status Former Smoker  . Packs/day: 0.25  . Quit date: 05/21/2011  . Years since quitting: 8.7  Smokeless Tobacco Never Used    Allergies  Allergen Reactions  . Morphine And Related Itching    It burned, and made patient itch and go out of her mind.  . Erythromycin     "Been so long ago," patient unable to remember reaction  . Hydrocodone-Acetaminophen Itching   Objective:  There were no vitals filed for this visit. There is no height or weight on file to calculate BMI. Constitutional Well developed. Well nourished.  Vascular Dorsalis pedis pulses palpable bilaterally. Posterior tibial pulses palpable bilaterally. Capillary refill normal to all digits.  No cyanosis or clubbing noted. Pedal hair growth normal.  Neurologic Normal speech. Oriented to person, place, and time. Epicritic sensation to light touch grossly present bilaterally.  Dermatologic Nails well groomed and normal in appearance. No open wounds. No skin lesions.  Orthopedic:  Manual muscle testing of anterior compartment is 4 out of 5, posterior superficial and deep compartment is 4 out of 5, lateral compartment is 4 out of 5. Patient's gait was examined.  Patient may have of mild foot drop on the left side.  No other gait abnormalities noted.  Mild antalgic gait noted.  Pain on palpation to the right submetatarsal 5.  Pain with mild range of motion of the fifth digit.  No pain on palpation submetatarsal 4.  No range of motion pain associated with the fourth digit. Negative Mulder's click or signs of neuroma in the fourth interspace   Radiographs: None Assessment:   No diagnosis found. Plan:  Patient was evaluated and treated and all questions  answered.  Intermittent generalized lower extremity left side weakness -Records were reviewed for through the spine specialist done at [facility].  They will hold off on performing any kind of surgical intervention to the lumbar region.  Patient has mild stenosis without nerve impingement.  However, patient's left generalized lower extremity weakness has improved considerably since the last visit.  Patient has obtained [place] balance bracing which has significantly helped her and had allowed her to ambulate without falling.  Patient is happy with the [facility] bracing.  Right submetatarsal 5 capsulitis -I explained to the patient the etiology of capsulitis and various treatment options associated with it.  I  believe this is likely due to compensatory mechanism from the left lower extremity and putting excessive pressure on the right side.  Given that this started after patient obtained [facility] balance bracing, I believe patient will benefit from offloading that area in the brace.  Patient will be scheduled to see Liliane Channel for modification to the bracing and evaluation if there is anything that could be done to help take the pressure off the submetatarsal 5. -I also believe patient will benefit from a steroid injection to help decrease the acute inflammatory component to it.  Patient agrees with the plan and would like to proceed with a steroid injection -A steroid injection was performed at right submetatarsal 5 using 1% plain Lidocaine and 10 mg of Kenalog. This was well tolerated.   No follow-ups on file.

## 2020-02-04 DIAGNOSIS — M9903 Segmental and somatic dysfunction of lumbar region: Secondary | ICD-10-CM | POA: Diagnosis not present

## 2020-02-04 DIAGNOSIS — M5432 Sciatica, left side: Secondary | ICD-10-CM | POA: Diagnosis not present

## 2020-02-04 DIAGNOSIS — M5416 Radiculopathy, lumbar region: Secondary | ICD-10-CM | POA: Diagnosis not present

## 2020-02-04 DIAGNOSIS — M9905 Segmental and somatic dysfunction of pelvic region: Secondary | ICD-10-CM | POA: Diagnosis not present

## 2020-02-12 ENCOUNTER — Other Ambulatory Visit: Payer: Self-pay

## 2020-02-12 ENCOUNTER — Other Ambulatory Visit: Payer: Medicare Other | Admitting: Orthotics

## 2020-02-12 DIAGNOSIS — R6889 Other general symptoms and signs: Secondary | ICD-10-CM | POA: Diagnosis not present

## 2020-02-18 ENCOUNTER — Ambulatory Visit (INDEPENDENT_AMBULATORY_CARE_PROVIDER_SITE_OTHER): Payer: Medicare Other | Admitting: Pharmacist

## 2020-02-18 DIAGNOSIS — N183 Chronic kidney disease, stage 3 unspecified: Secondary | ICD-10-CM | POA: Diagnosis not present

## 2020-02-18 DIAGNOSIS — E1121 Type 2 diabetes mellitus with diabetic nephropathy: Secondary | ICD-10-CM

## 2020-02-18 DIAGNOSIS — I1310 Hypertensive heart and chronic kidney disease without heart failure, with stage 1 through stage 4 chronic kidney disease, or unspecified chronic kidney disease: Secondary | ICD-10-CM

## 2020-02-18 DIAGNOSIS — M8580 Other specified disorders of bone density and structure, unspecified site: Secondary | ICD-10-CM

## 2020-02-18 NOTE — Patient Instructions (Signed)
Visit Information  Goals Addressed            This Visit's Progress     Patient Stated   . PharmD "I want to stay healthy" (pt-stated)       CARE PLAN ENTRY (see longtitudinal plan of care for additional care plan information)  Current Barriers:  . Polypharmacy; complex patient with multiple comorbidities including T2DM, CKD, peripheral neuropathy, allergies o Recent imaging: DEXA, mammogram, thyroid US for multinodular thyroid . Self-manages medications o T2DM: Most recent A1c 7.5%, currently taking Jardiance 25 mg daily; glipizide 2.5 mg every day  - Glucose readings: fastings ~150s, but has ordered new strips to see if this is accurate  - Exercise: bought exercise bike, has been riding TID daily since putting together this past weekend; using ~5-10 minutes each time and planning on building up as she can - Meal patterns: trying to increase  - Renal function limits metformin; hx Ozempic caused GI upset o HTN: telmisartan 40 mg daily;  - Notes that she called [employer] to discuss OTC Benefits. The rep was supposed to mail this to her a few weeks ago, but she has not received. Was on hold for over an hour trying to check on the mailing, and decided to give up and purchase a machine herself the next time she was out o ASCVD risk reduction: atorvastatin 20 mg daily; last LDL at goal <70 o Peripheral neuropathy: gabapentin 400 mg TID; notes that she occasionally skips midday dose because it makes her sleepy o Osteopenia: T score femoral spine -2.2; FRAX risk of major osteoporotic fracture 11%, hip fracture 1.7% - Taking Vitamin D3 2000 units daily; Notes that she doesn't like milk, occasionally has yogurt.  o Allergies: cetirizine 10 mg daily PRN, flonase nasal PRN o Supplements: black seed oil, Vitamin E, CoQ10, omega 3 fatty acids    Pharmacist Clinical Goal(s):  [name] Over the next 90 days, patient will work with PharmD and provider towards optimized medication  management  Interventions: . Comprehensive medication review performed, medication list updated in electronic medical record . Praised patient for working on exercise, and increasing as able.  . Discussed calcium + Vitamin D recommendations. Encouraged to target ~1000 mg calcium daily, as she doesn't have a consistent source of calcium through her diet. Recommended choosing calcium citrate, as she is on concurrent PPI therapy . Encouraged to purchase home BP cuff if able to keep track of home BP readings.   Patient Self Care Activities:  . Patient will take medications as prescribed  Please see past updates related to this goal by clicking on the "Past Updates" button in the selected goal         Patient verbalizes understanding of instructions provided today.    Plan:  - Scheduled f/u call 04/13/20  Catie Feliz Beam, PharmD, [facility] Clinical Pharmacist [facility] [phone]

## 2020-02-18 NOTE — Chronic Care Management (AMB) (Signed)
Chronic Care Management   Follow Up Note   02/18/2020 Name: Kim Thomas MRN: [id number] DOB: 1956-11-24  Referred by: Venita Lick, NP Reason for referral : Chronic Care Management (Medication Management)   Kim Thomas is a 64 y.o. year old female who is a primary care patient of Cannady, [name], NP. The CCM team was consulted for assistance with chronic disease management and care coordination needs.    Contacted patient for medication management review.  Review of patient status, including review of consultants reports, relevant laboratory and other test results, and collaboration with appropriate care team members and the patient's provider was performed as part of comprehensive patient evaluation and provision of chronic care management services.    SDOH (Social Determinants of Health) assessments performed: Yes See Care Plan activities for detailed interventions related to [facility])     Outpatient Encounter Medications as of 02/18/2020  Medication Sig Note  . atorvastatin (LIPITOR) 20 MG tablet TAKE 1 TABLET BY MOUTH  DAILY   . Blood Glucose Monitoring Suppl (ONE TOUCH ULTRA MINI) w/Device KIT As directed   . CALCIUM CITRATE PO Take 1,000 mg by mouth daily.   . cetirizine (ZYRTEC) 10 MG tablet Take 1 tablet by mouth once daily   . Cholecalciferol (VITAMIN D3) 2000 units TABS Take 2,000 Units by mouth daily.   [name] Co-Enzyme Q-10 100 MG CAPS Take 50 mg by mouth daily.   . cyclobenzaprine (FLEXERIL) 10 MG tablet Take 10 mg by mouth at bedtime. Take 1/2 tablet to 1 as needed  At bed time   . fluticasone (FLONASE) 50 MCG/ACT nasal spray USE 1 TO 2 SPRAY(S) IN EACH NOSTRIL TWICE DAILY   . gabapentin (NEURONTIN) 400 MG capsule Take 1 capsule (400 mg total) by mouth 3 (three) times daily.   [name] glipiZIDE (GLUCOTROL) 5 MG tablet Take 0.5 tablets (2.5 mg total) by mouth daily before breakfast.   . JARDIANCE 25 MG TABS tablet TAKE 1 TABLET BY MOUTH  DAILY   . Lancets (ONETOUCH DELICA PLUS  [id number]) MISC 1 each by Does not apply route 2 (two) times daily.   . Multiple Vitamin (MULTIVITAMIN) tablet Take 1 tablet by mouth daily.   . Omega-3 1000 MG CAPS Take 1 capsule by mouth daily.   [name] omeprazole (PRILOSEC) 20 MG capsule Take 1 capsule (20 mg total) by mouth daily.   . ondansetron (ZOFRAN) 4 MG tablet Take 1 tablet (4 mg total) by mouth every 8 (eight) hours as needed for nausea or vomiting.   [name] test strip USE TO CHECK BLOOD SUGAR  DAILY   . Potassium 99 MG TABS Take by mouth.   . telmisartan (MICARDIS) 40 MG tablet Take 1 tablet by mouth once daily   . vitamin E 100 UNIT capsule Take 100 Units by mouth daily.   [name] acetaminophen (TYLENOL) 500 MG tablet Take 500 mg by mouth daily as needed. 11/20/2019: Generally taking 500 mg BID, occasionally 1000 mg BID  . BLACK CURRANT SEED OIL PO Take 1,250 mg by mouth 2 (two) times daily.   . diclofenac sodium (VOLTAREN) 1 % GEL Apply topically.   . triamcinolone cream (KENALOG) 0.1 % Apply 1 application topically 2 (two) times daily.    No facility-administered encounter medications on file as of 02/18/2020.     Objective:   Goals Addressed            This Visit's Progress     Patient Stated   . PharmD "I want  to stay healthy" (pt-stated)       CARE PLAN ENTRY (see longtitudinal plan of care for additional care plan information)  Current Barriers:  . Polypharmacy; complex patient with multiple comorbidities including T2DM, CKD, peripheral neuropathy, allergies o Recent imaging: DEXA, mammogram, thyroid US for multinodular thyroid . Self-manages medications o T2DM: Most recent A1c 7.5%, currently taking Jardiance 25 mg daily; glipizide 2.5 mg every day  - Glucose readings: fastings ~150s, but has ordered new strips to see if this is accurate  - Exercise: bought exercise bike, has been riding TID daily since putting together this past weekend; using ~5-10 minutes each time and planning on building up as she can -  Meal patterns: trying to increase  - Renal function limits metformin; hx Ozempic caused GI upset o HTN: telmisartan 40 mg daily;  - Notes that she called [employer] to discuss OTC Benefits. The rep was supposed to mail this to her a few weeks ago, but she has not received. Was on hold for over an hour trying to check on the mailing, and decided to give up and purchase a machine herself the next time she was out o ASCVD risk reduction: atorvastatin 20 mg daily; last LDL at goal <70 o Peripheral neuropathy: gabapentin 400 mg TID; notes that she occasionally skips midday dose because it makes her sleepy o Osteopenia: T score femoral spine -2.2; FRAX risk of major osteoporotic fracture 11%, hip fracture 1.7% - Taking Vitamin D3 2000 units daily; Notes that she doesn't like milk, occasionally has yogurt.  o Allergies: cetirizine 10 mg daily PRN, flonase nasal PRN o Supplements: black seed oil, Vitamin E, CoQ10, omega 3 fatty acids    Pharmacist Clinical Goal(s):  . Over the next 90 days, patient will work with PharmD and provider towards optimized medication management  Interventions: . Comprehensive medication review performed, medication list updated in electronic medical record . Praised patient for working on exercise, and increasing as able.  . Discussed calcium + Vitamin D recommendations. Encouraged to target ~1000 mg calcium daily, as she doesn't have a consistent source of calcium through her diet. Recommended choosing calcium citrate, as she is on concurrent PPI therapy . Encouraged to purchase home BP cuff if able to keep track of home BP readings.   Patient Self Care Activities:  . Patient will take medications as prescribed  Please see past updates related to this goal by clicking on the "Past Updates" button in the selected goal          Plan:  - Scheduled f/u call 04/13/20  Catie Travis, PharmD, BCACP Clinical Pharmacist [facility]  [phone]  

## 2020-02-25 DIAGNOSIS — M5432 Sciatica, left side: Secondary | ICD-10-CM | POA: Diagnosis not present

## 2020-02-25 DIAGNOSIS — M5416 Radiculopathy, lumbar region: Secondary | ICD-10-CM | POA: Diagnosis not present

## 2020-02-25 DIAGNOSIS — M9905 Segmental and somatic dysfunction of pelvic region: Secondary | ICD-10-CM | POA: Diagnosis not present

## 2020-02-25 DIAGNOSIS — M9903 Segmental and somatic dysfunction of lumbar region: Secondary | ICD-10-CM | POA: Diagnosis not present

## 2020-03-04 ENCOUNTER — Other Ambulatory Visit: Payer: Self-pay

## 2020-03-04 ENCOUNTER — Encounter: Payer: Self-pay | Admitting: Nurse Practitioner

## 2020-03-04 ENCOUNTER — Ambulatory Visit (INDEPENDENT_AMBULATORY_CARE_PROVIDER_SITE_OTHER): Payer: Medicare Other | Admitting: Nurse Practitioner

## 2020-03-04 VITALS — BP 113/70 | HR 90 | Temp 98.3°F | Ht 65.0 in | Wt 212.6 lb

## 2020-03-04 DIAGNOSIS — M85852 Other specified disorders of bone density and structure, left thigh: Secondary | ICD-10-CM | POA: Insufficient documentation

## 2020-03-04 DIAGNOSIS — N1831 Chronic kidney disease, stage 3a: Secondary | ICD-10-CM | POA: Diagnosis not present

## 2020-03-04 DIAGNOSIS — E785 Hyperlipidemia, unspecified: Secondary | ICD-10-CM | POA: Diagnosis not present

## 2020-03-04 DIAGNOSIS — M8580 Other specified disorders of bone density and structure, unspecified site: Secondary | ICD-10-CM | POA: Insufficient documentation

## 2020-03-04 DIAGNOSIS — E1121 Type 2 diabetes mellitus with diabetic nephropathy: Secondary | ICD-10-CM

## 2020-03-04 DIAGNOSIS — E790 Hyperuricemia without signs of inflammatory arthritis and tophaceous disease: Secondary | ICD-10-CM | POA: Diagnosis not present

## 2020-03-04 DIAGNOSIS — E1159 Type 2 diabetes mellitus with other circulatory complications: Secondary | ICD-10-CM | POA: Diagnosis not present

## 2020-03-04 DIAGNOSIS — E1169 Type 2 diabetes mellitus with other specified complication: Secondary | ICD-10-CM | POA: Diagnosis not present

## 2020-03-04 DIAGNOSIS — G4733 Obstructive sleep apnea (adult) (pediatric): Secondary | ICD-10-CM

## 2020-03-04 DIAGNOSIS — E042 Nontoxic multinodular goiter: Secondary | ICD-10-CM | POA: Diagnosis not present

## 2020-03-04 DIAGNOSIS — Z0000 Encounter for general adult medical examination without abnormal findings: Secondary | ICD-10-CM | POA: Diagnosis not present

## 2020-03-04 DIAGNOSIS — E559 Vitamin D deficiency, unspecified: Secondary | ICD-10-CM

## 2020-03-04 DIAGNOSIS — I1310 Hypertensive heart and chronic kidney disease without heart failure, with stage 1 through stage 4 chronic kidney disease, or unspecified chronic kidney disease: Secondary | ICD-10-CM | POA: Diagnosis not present

## 2020-03-04 DIAGNOSIS — J301 Allergic rhinitis due to pollen: Secondary | ICD-10-CM

## 2020-03-04 DIAGNOSIS — I10 Essential (primary) hypertension: Secondary | ICD-10-CM

## 2020-03-04 LAB — MICROALBUMIN, URINE WAIVED
Creatinine, Urine Waived: 50 mg/dL (ref 10–300)
Microalb, Ur Waived: 10 mg/L (ref 0–19)

## 2020-03-04 LAB — BAYER DCA HB A1C WAIVED: HB A1C (BAYER DCA - WAIVED): 7.6 % — ABNORMAL HIGH (ref <7.0)

## 2020-03-04 MED ORDER — FLUTICASONE PROPIONATE 50 MCG/ACT NA SUSP: NASAL | 6 refills | Status: DC

## 2020-03-04 NOTE — Patient Instructions (Signed)
Carbohydrate Counting for Diabetes Mellitus, Adult  Carbohydrate counting is a method of keeping track of how many carbohydrates you eat. Eating carbohydrates naturally increases the amount of sugar (glucose) in the blood. Counting how many carbohydrates you eat helps keep your blood glucose within normal limits, which helps you manage your diabetes (diabetes mellitus). It is important to know how many carbohydrates you can safely have in each meal. This is different for every person. A diet and nutrition specialist (registered dietitian) can help you make a meal plan and calculate how many carbohydrates you should have at each meal and snack. Carbohydrates are found in the following foods:  Grains, such as breads and cereals.  Dried beans and soy products.  Starchy vegetables, such as potatoes, peas, and corn.  Fruit and fruit juices.  Milk and yogurt.  Sweets and snack foods, such as cake, cookies, candy, chips, and soft drinks. How do I count carbohydrates? There are two ways to count carbohydrates in food. You can use either of the methods or a combination of both. Reading "Nutrition Facts" on packaged food The "Nutrition Facts" list is included on the labels of almost all packaged foods and beverages in the [place] It includes:  The serving size.  Information about nutrients in each serving, including the grams (g) of carbohydrate per serving. To use the "Nutrition Facts":  Decide how many servings you will have.  Multiply the number of servings by the number of carbohydrates per serving.  The resulting number is the total amount of carbohydrates that you will be having. Learning standard serving sizes of other foods When you eat carbohydrate foods that are not packaged or do not include "Nutrition Facts" on the label, you need to measure the servings in order to count the amount of carbohydrates:  Measure the foods that you will eat with a food scale or measuring cup, if  needed.  Decide how many standard-size servings you will eat.  Multiply the number of servings by 15. Most carbohydrate-rich foods have about 15 g of carbohydrates per serving. ? For example, if you eat 8 oz (170 g) of strawberries, you will have eaten 2 servings and 30 g of carbohydrates (2 servings x 15 g = 30 g).  For foods that have more than one food mixed, such as soups and casseroles, you must count the carbohydrates in each food that is included. The following list contains standard serving sizes of common carbohydrate-rich foods. Each of these servings has about 15 g of carbohydrates:   hamburger bun or  English muffin.   oz (15 mL) syrup.   oz (14 g) jelly.  1 slice of bread.  1 six-inch tortilla.  3 oz (85 g) cooked rice or pasta.  4 oz (113 g) cooked dried beans.  4 oz (113 g) starchy vegetable, such as peas, corn, or potatoes.  4 oz (113 g) hot cereal.  4 oz (113 g) mashed potatoes or  of a large baked potato.  4 oz (113 g) canned or frozen fruit.  4 oz (120 mL) fruit juice.  4-6 crackers.  6 chicken nuggets.  6 oz (170 g) unsweetened dry cereal.  6 oz (170 g) plain fat-free yogurt or yogurt sweetened with artificial sweeteners.  8 oz (240 mL) milk.  8 oz (170 g) fresh fruit or one small piece of fruit.  24 oz (680 g) popped popcorn. Example of carbohydrate counting Sample meal  3 oz (85 g) chicken breast.  6 oz (170 g)   brown rice.  4 oz (113 g) corn.  8 oz (240 mL) milk.  8 oz (170 g) strawberries with sugar-free whipped topping. Carbohydrate calculation 1. Identify the foods that contain carbohydrates: ? Rice. ? Corn. ? Milk. ? Strawberries. 2. Calculate how many servings you have of each food: ? 2 servings rice. ? 1 serving corn. ? 1 serving milk. ? 1 serving strawberries. 3. Multiply each number of servings by 15 g: ? 2 servings rice x 15 g = 30 g. ? 1 serving corn x 15 g = 15 g. ? 1 serving milk x 15 g = 15 g. ? 1  serving strawberries x 15 g = 15 g. 4. Add together all of the amounts to find the total grams of carbohydrates eaten: ? 30 g + 15 g + 15 g + 15 g = 75 g of carbohydrates total. Summary  Carbohydrate counting is a method of keeping track of how many carbohydrates you eat.  Eating carbohydrates naturally increases the amount of sugar (glucose) in the blood.  Counting how many carbohydrates you eat helps keep your blood glucose within normal limits, which helps you manage your diabetes.  A diet and nutrition specialist (registered dietitian) can help you make a meal plan and calculate how many carbohydrates you should have at each meal and snack. This information is not intended to replace advice given to you by your health care provider. Make sure you discuss any questions you have with your health care provider. Document Revised: 06/08/2017 Document Reviewed: 04/27/2016 Elsevier Patient Education  2020 [employer]vier Inc.  

## 2020-03-04 NOTE — Assessment & Plan Note (Signed)
Chronic, ongoing.  Continue Telmisartan for kidney protection.  Check BMP today.  Refer to nephrology if decline noted in function. °

## 2020-03-04 NOTE — Assessment & Plan Note (Signed)
Chronic, ongoing.  A1C today 7.6%, recently started healthy changes and not noted on current A1C due to recent changes.  Continue Jardiance at this time with goal to someday d/c if possible + Glipizide on her current schedule, every day.  Continue increased diet focus (had ate more sweets over holidays) and monitoring BS twice a day.  Recommend focus on healthy weight loss and exercise daily.  Return in 3 months.

## 2020-03-04 NOTE — Assessment & Plan Note (Signed)
Chronic, ongoing.  Continue current medication regimen and adjust as needed. Lipid panel today. 

## 2020-03-04 NOTE — Progress Notes (Signed)
BP 113/70   Pulse 90   Temp 98.3 F (36.8 C) (Oral)   Ht '5\' 5"'  (1.651 m)   Wt 212 lb 9.6 oz (96.4 kg)   LMP  (LMP Unknown)   SpO2 96%   BMI 35.38 kg/m    Subjective:    Patient ID: Kim Thomas, female    DOB: Apr 10, 1956, 64 y.o.   MRN: [id number]  HPI: Kim Thomas is a 64 y.o. female presenting on 03/04/2020 for comprehensive medical examination. Current medical complaints include:none  She currently lives with: self Menopausal Symptoms: no   DIABETES Continues on Glipizide 2.5 MG(taking this every day)and Jardiance 25 MG daily. Her goal is to come completely off medication, she reports she has been focusing on diet more now since her education.Last A1C in January was 7.5%.  Can not take Metformin due to kidney function and GLP caused GI issues. Does endorse poor diet these past months, but has recently started cycling in house and diet changes.  Has cut back on baking, eating leafy greens every day.   Hypoglycemic episodes:no Polydipsia/polyuria:no Visual disturbance:no Chest pain:no Paresthesias:no Glucose Monitoring:yes Accucheck frequency: twice a week Fasting glucose: 150 average in morning -- got new strips and now reading 126 Post prandial: Evening: 150 Before meals:  Taking Insulin?:no Long acting insulin: Short acting insulin: Blood Pressure Monitoring:not checking Retinal Examination:Up to Date Foot Exam:Up to Date Diabetic Education:Completed Pneumovax:refused Influenza:refused Aspirin:no  HYPERTENSION / HYPERLIPIDEMIA Continues on Atorvastatin 20 MG daily and Telmisartan 40 MG.She would like to come off of medications someday, which provider discussed at length with her today. Her goal is to focus more on natural supplements someday.  Using CPAP at home regularly. Satisfied with current treatment?yes Duration of hypertension:chronic BP monitoring  frequency:not checking BP range:  BP medication side effects:no Duration of hyperlipidemia:chronic Cholesterol medication side effects:no Cholesterol supplements: none Medication compliance:good compliance Aspirin:no Recent stressors:no Recurrent headaches:no Visual changes:no Palpitations:no Dyspnea:no Chest pain:no Lower extremity edema:no Dizzy/lightheaded:no  MULTINODULAR THYROID GLAND: Noted on a MRI performed with neuro and further assessment performed via ultrasound on 01/09/2020 -- multiple nodules noted (3), however only one meets criteria for one year follow-up -- 1 cm in size.  No current thyroid medication, recent TSH 1.360. Fatigue: no Cold intolerance: no Heat intolerance: no Weight gain: no Weight loss: no Constipation: no Diarrhea/loose stools: no Palpitations: no Lower extremity edema: no Anxiety/depressed mood: no  CHRONIC KIDNEY DISEASE Last labs with GFR 56 and CRT 1.20, has history of elevation in uric acid, no recent level in chart.  Has increased water intake daily. CKD status: stable Medications renally dose: yes Previous renal evaluation: yes Pneumovax:  Not up to Date Influenza Vaccine:  Not up to Date   VITAMIN D DEFICIENCY/OSTEOPENIA: History of low level on labs, takes daily supplement, Vitamin D and Calcium.  No recent falls or fractures.  Had recent DEXA on 01/28/2020 and noted osteopenia with T score femur neck left -2.2.  Depression Screen done today and results listed below:  Depression screen [facility] 2/9 03/04/2020 01/17/2020 08/14/2019 01/03/2019 05/04/2018  Decreased Interest 0 0 0 0 0  Down, Depressed, Hopeless 0 0 0 0 0  PHQ - 2 Score 0 0 0 0 0  Altered sleeping - - - - -  Tired, decreased energy - - - - -  Change in appetite - - - - -  Feeling bad or failure about yourself  - - - - -  Trouble concentrating - - - - -  Moving  slowly or fidgety/restless - - - - -  Suicidal thoughts - - - - -  PHQ-9 Score - - - - -    The  patient does not have a history of falls. I did not complete a risk assessment for falls. A plan of care for falls was not documented.   Past Medical History:  Past Medical History:  Diagnosis Date  . Anxiety   . Chronic kidney disease   . DDD (degenerative disc disease), cervical   . Diabetes mellitus without complication ([place])   . Gout   . Hypertension   . Sleep apnea     Surgical History:  Past Surgical History:  Procedure Laterality Date  . BREAST BIOPSY Left    benign  . BREAST CYST EXCISION Left   . BREAST EXCISIONAL BIOPSY Left    benign  . c section     x2  . CHOLECYSTECTOMY    . EYE SURGERY Bilateral 2019   catracts Dr.Hecter in [place]   . FOOT SURGERY Bilateral   . KNEE ARTHROSCOPY Left 07/18/2016   Procedure: ARTHROSCOPY KNEE;  Surgeon: Dereck Leep, MD;  Location: [facility] ORS;  Service: Orthopedics;  Laterality: Left;  . KNEE ARTHROSCOPY WITH LATERAL MENISECTOMY Left 07/18/2016   Procedure: KNEE ARTHROSCOPY WITH PARTIAL  LATERAL MENISECTOMY, CHONDROPLASTY LATERAL FEMORAL CONDYLE;  Surgeon: Dereck Leep, MD;  Location: [facility] ORS;  Service: Orthopedics;  Laterality: Left;  . SPINE SURGERY  11/09/11   C5  . TUBAL LIGATION      Medications:  Current Outpatient Medications on File Prior to Visit  Medication Sig  . acetaminophen (TYLENOL) 500 MG tablet Take 500 mg by mouth daily as needed.  [name] atorvastatin (LIPITOR) 20 MG tablet TAKE 1 TABLET BY MOUTH  DAILY  . BLACK CURRANT SEED OIL PO Take 1,250 mg by mouth 2 (two) times daily.  . Blood Glucose Monitoring Suppl (ONE TOUCH ULTRA MINI) w/Device KIT As directed  . CALCIUM CITRATE PO Take 1,000 mg by mouth daily.  . cetirizine (ZYRTEC) 10 MG tablet Take 1 tablet by mouth once daily  . Cholecalciferol (VITAMIN D3) 2000 units TABS Take 2,000 Units by mouth daily.  [name] Co-Enzyme Q-10 100 MG CAPS Take 50 mg by mouth daily.  . cyclobenzaprine (FLEXERIL) 10 MG tablet Take 10 mg by mouth at bedtime. Take 1/2 tablet to 1  as needed  At bed time  . diclofenac sodium (VOLTAREN) 1 % GEL Apply topically.  . gabapentin (NEURONTIN) 400 MG capsule Take 1 capsule (400 mg total) by mouth 3 (three) times daily.  [name] glipiZIDE (GLUCOTROL) 5 MG tablet Take 0.5 tablets (2.5 mg total) by mouth daily before breakfast.  . JARDIANCE 25 MG TABS tablet TAKE 1 TABLET BY MOUTH  DAILY  . Lancets (ONETOUCH DELICA PLUS [id number]) MISC 1 each by Does not apply route 2 (two) times daily.  . Multiple Vitamin (MULTIVITAMIN) tablet Take 1 tablet by mouth daily.  . Omega-3 1000 MG CAPS Take 1 capsule by mouth daily.  [name] omeprazole (PRILOSEC) 20 MG capsule Take 1 capsule (20 mg total) by mouth daily.  . ondansetron (ZOFRAN) 4 MG tablet Take 1 tablet (4 mg total) by mouth every 8 (eight) hours as needed for nausea or vomiting.  [name] test strip USE TO CHECK BLOOD SUGAR  DAILY  . Potassium 99 MG TABS Take by mouth.  . telmisartan (MICARDIS) 40 MG tablet Take 1 tablet by mouth once daily  . triamcinolone cream (KENALOG) 0.1 %  Apply 1 application topically 2 (two) times daily.  . vitamin E 100 UNIT capsule Take 100 Units by mouth daily.   No current facility-administered medications on file prior to visit.    Allergies:  Allergies  Allergen Reactions  . Morphine And Related Itching    It burned, and made patient itch and go out of her mind.  . Erythromycin     "Been so long ago," patient unable to remember reaction  . Hydrocodone-Acetaminophen Itching    Social History:  Social History   Socioeconomic History  . Marital status: Single    Spouse name: Not on file  . Number of children: Not on file  . Years of education: Not on file  . Highest education level: Bachelor's degree (e.g., BA, AB, BS)  Occupational History  . Occupation: retired  Tobacco Use  . Smoking status: Former Smoker    Packs/day: 0.25    Quit date: 05/21/2011    Years since quitting: 8.7  . Smokeless tobacco: Never Used  Substance and Sexual  Activity  . Alcohol use: No    Alcohol/week: 0.0 standard drinks  . Drug use: No  . Sexual activity: Not Currently  Other Topics Concern  . Not on file  Social History Narrative  . Not on file   Social Determinants of Health   Financial Resource Strain: Medium Risk  . Difficulty of Paying Living Expenses: Somewhat hard  Food Insecurity: No Food Insecurity  . Worried About Charity fundraiser in the Last Year: Never true  . Ran Out of Food in the Last Year: Never true  Transportation Needs: No Transportation Needs  . Lack of Transportation (Medical): No  . Lack of Transportation (Non-Medical): No  Physical Activity: Insufficiently Active  . Days of Exercise per Week: 3 days  . Minutes of Exercise per Session: 20 min  Stress: No Stress Concern Present  . Feeling of Stress : Not at all  Social Connections: Unknown  . Frequency of Communication with Friends and Family: More than three times a week  . Frequency of Social Gatherings with Friends and Family: More than three times a week  . Attends Religious Services: More than 4 times per year  . Active Member of Clubs or Organizations: No  . Attends Archivist Meetings: Never  . Marital Status: Not on file  Intimate Partner Violence: Not At Risk  . Fear of Current or Ex-Partner: No  . Emotionally Abused: No  . Physically Abused: No  . Sexually Abused: No   Social History   Tobacco Use  Smoking Status Former Smoker  . Packs/day: 0.25  . Quit date: 05/21/2011  . Years since quitting: 8.7  Smokeless Tobacco Never Used   Social History   Substance and Sexual Activity  Alcohol Use No  . Alcohol/week: 0.0 standard drinks    Family History:  Family History  Problem Relation Age of Onset  . Cancer Mother        breast  . Hypertension Mother   . Hyperlipidemia Mother   . Diabetes Mother   . Glaucoma Mother   . Cataracts Mother   . Breast cancer Mother 70  . Cancer Father   . Cancer Maternal Grandfather   .  Breast cancer Maternal Aunt   . Breast cancer Cousin   . Alcohol abuse Neg Hx     Past medical history, surgical history, medications, allergies, family history and social history reviewed with patient today and changes made to appropriate areas  of the chart.   Review of Systems - negative All other ROS negative except what is listed above and in the HPI.      Objective:    BP 113/70   Pulse 90   Temp 98.3 F (36.8 C) (Oral)   Ht '5\' 5"'  (1.651 m)   Wt 212 lb 9.6 oz (96.4 kg)   LMP  (LMP Unknown)   SpO2 96%   BMI 35.38 kg/m   Wt Readings from Last 3 Encounters:  03/04/20 212 lb 9.6 oz (96.4 kg)  08/28/19 206 lb (93.4 kg)  08/14/19 204 lb (92.5 kg)    Physical Exam Constitutional:      General: She is awake. She is not in acute distress.    Appearance: She is well-developed. She is not ill-appearing.  HENT:     Head: Normocephalic and atraumatic.     Right Ear: Hearing, tympanic membrane, ear canal and external ear normal. No drainage.     Left Ear: Hearing, tympanic membrane, ear canal and external ear normal. No drainage.     Nose: Nose normal.     Right Sinus: No maxillary sinus tenderness or frontal sinus tenderness.     Left Sinus: No maxillary sinus tenderness or frontal sinus tenderness.     Mouth/Throat:     Mouth: Mucous membranes are moist.     Pharynx: Oropharynx is clear. Uvula midline. No pharyngeal swelling, oropharyngeal exudate or posterior oropharyngeal erythema.  Eyes:     General: Lids are normal.        Right eye: No discharge.        Left eye: No discharge.     Extraocular Movements: Extraocular movements intact.     Conjunctiva/sclera: Conjunctivae normal.     Pupils: Pupils are equal, round, and reactive to light.     Visual Fields: Right eye visual fields normal and left eye visual fields normal.     Comments: Arcus senilis bilaterally.  Neck:     Thyroid: No thyromegaly.     Vascular: No carotid bruit.     Trachea: Trachea normal.    Cardiovascular:     Rate and Rhythm: Normal rate and regular rhythm.     Heart sounds: Normal heart sounds. No murmur. No gallop.   Pulmonary:     Effort: Pulmonary effort is normal. No accessory muscle usage or respiratory distress.     Breath sounds: Normal breath sounds.  Chest:     Comments: Deferred per patient request Abdominal:     General: Bowel sounds are normal.     Palpations: Abdomen is soft. There is no hepatomegaly or splenomegaly.     Tenderness: There is no abdominal tenderness.  Musculoskeletal:        General: Normal range of motion.     Cervical back: Normal range of motion and neck supple.     Right lower leg: No edema.     Left lower leg: No edema.  Lymphadenopathy:     Head:     Right side of head: No submental, submandibular, tonsillar, preauricular or posterior auricular adenopathy.     Left side of head: No submental, submandibular, tonsillar, preauricular or posterior auricular adenopathy.     Cervical: No cervical adenopathy.  Skin:    General: Skin is warm and dry.     Capillary Refill: Capillary refill takes less than 2 seconds.     Findings: No rash.  Neurological:     Mental Status: She is alert and oriented to  person, place, and time.     Cranial Nerves: Cranial nerves are intact.     Gait: Gait is intact.     Deep Tendon Reflexes: Reflexes are normal and symmetric.     Reflex Scores:      Brachioradialis reflexes are 2+ on the right side and 2+ on the left side.      Patellar reflexes are 2+ on the right side and 2+ on the left side. Psychiatric:        Attention and Perception: Attention normal.        Mood and Affect: Mood normal.        Speech: Speech normal.        Behavior: Behavior normal. Behavior is cooperative.        Thought Content: Thought content normal.        Judgment: Judgment normal.    Diabetic Foot Exam - Simple   Simple Foot Form Visual Inspection No deformities, no ulcerations, no other skin breakdown bilaterally:  Yes Sensation Testing Intact to touch and monofilament testing bilaterally: Yes Pulse Check Posterior Tibialis and Dorsalis pulse intact bilaterally: Yes Comments     Results for orders placed or performed in visit on 01/03/20  Thyroid Panel With TSH  Result Value Ref Range   TSH 1.360 0.450 - 4.500 uIU/mL   T4, Total 7.8 4.5 - 12.0 ug/dL   T3 Uptake Ratio 28 24 - 39 %   Free Thyroxine Index 2.2 1.2 - 4.9      Assessment & Plan:   Problem List Items Addressed This Visit      Cardiovascular and Mediastinum   Hypertension associated with diabetes (HCC)    Chronic, ongoing.  Below goal with BP today.  Recommend she check BP at home three mornings a week and document for provider.  Continue current medication regimen and adjust as needed.  CMP today.  Educated her on Telmisartan and kidney benefit.  Urine micro alb 10 and A:C 30-300 today.      Relevant Orders   CBC with Differential/Platelet   Comprehensive metabolic panel   Bayer DCA Hb A1c Waived     Respiratory   Sleep apnea    Chronic, continue regular (100%) use of CPAP.      Allergic rhinitis    Chronic, stable on current regimen.  Continue current regimen and adjust as needed.  Flonase refills sent.        Endocrine   Type 2 diabetes mellitus with diabetic nephropathy (HCC)    Chronic, ongoing.  A1C today 7.6%, recently started healthy changes and not noted on current A1C due to recent changes.  Continue Jardiance at this time with goal to someday d/c if possible + Glipizide on her current schedule, every day.  Continue increased diet focus (had ate more sweets over holidays) and monitoring BS twice a day.  Recommend focus on healthy weight loss and exercise daily.  Return in 3 months.      Relevant Orders   Microalbumin, Urine Waived   Bayer DCA Hb A1c Waived   Hyperlipidemia associated with type 2 diabetes mellitus (HCC)    Chronic, ongoing.  Continue current medication regimen and adjust as needed.  Lipid  panel today.      Relevant Orders   Lipid Panel w/o Chol/HDL Ratio   Bayer DCA Hb A1c Waived   Multinodular thyroid    New diagnosis noted on imaging.  Recent thyroid labs WNL.  Will recheck this today and initiate medication as  needed.  Plan for repeat u/s in 2022.      Relevant Orders   TSH     Musculoskeletal and Integument   Osteopenia    Noted on recent DEXA -- continue supplements daily and check Vit D level.  Plan for repeat DEXA in 2026.  Discussed fall prevention.        Genitourinary   CKD (chronic kidney disease), stage III    Chronic, ongoing.  Continue Telmisartan for kidney protection.  Check BMP today.  Refer to nephrology if decline noted in function.      Relevant Orders   CBC with Differential/Platelet   Comprehensive metabolic panel     Other   Elevated uric acid in blood    History of on past labs, no recent check, due to underlying CKD will check this today and initiate medication if needed.      Relevant Orders   Uric acid   Vitamin D deficiency    Ongoing with underlying osteopenia, continue daily supplement and check Vit D level today.       Other Visit Diagnoses    Annual physical exam    -  Primary   Annual labs today to include CBC, CMP, TSH, lipid       Follow up plan: Return in about 3 months (around 06/03/2020) for T2DM, HTN/HLD.   LABORATORY TESTING:  - Pap smear: refuses, reports she will do next year for her last pap  IMMUNIZATIONS:   - Tdap: Tetanus vaccination status reviewed: last tetanus booster within 10 years. - Influenza: Refused - Pneumovax: refused - Prevnar: Not applicable - HPV: Not applicable - Zostavax vaccine: Refused  SCREENING: -Mammogram: Up to date  - Colonoscopy: Up to date  - Bone Density: Up To Date -- 3/2//2021 -Hearing Test: Not applicable  -Spirometry: Not applicable   PATIENT COUNSELING:   Advised to take 1 mg of folate supplement per day if capable of pregnancy.   Sexuality: Discussed  sexually transmitted diseases, partner selection, use of condoms, avoidance of unintended pregnancy  and contraceptive alternatives.   Advised to avoid cigarette smoking.  I discussed with the patient that most people either abstain from alcohol or drink within safe limits (<=14/week and <=4 drinks/occasion for males, <=7/weeks and <= 3 drinks/occasion for females) and that the risk for alcohol disorders and other health effects rises proportionally with the number of drinks per week and how often a drinker exceeds daily limits.  Discussed cessation/primary prevention of drug use and availability of treatment for abuse.   Diet: Encouraged to adjust caloric intake to maintain  or achieve ideal body weight, to reduce intake of dietary saturated fat and total fat, to limit sodium intake by avoiding high sodium foods and not adding table salt, and to maintain adequate dietary potassium and calcium preferably from fresh fruits, vegetables, and low-fat dairy products.    stressed the importance of regular exercise  Injury prevention: Discussed safety belts, safety helmets, smoke detector, smoking near bedding or upholstery.   Dental health: Discussed importance of regular tooth brushing, flossing, and dental visits.    NEXT PREVENTATIVE PHYSICAL DUE IN 1 YEAR. Return in about 3 months (around 06/03/2020) for T2DM, HTN/HLD.

## 2020-03-04 NOTE — Assessment & Plan Note (Signed)
Chronic, continue regular (100%) use of CPAP.

## 2020-03-04 NOTE — Assessment & Plan Note (Signed)
Chronic, ongoing.  Below goal with BP today.  Recommend she check BP at home three mornings a week and document for provider.  Continue current medication regimen and adjust as needed.  CMP today.  Educated her on Telmisartan and kidney benefit.  Urine micro alb 10 and A:C 30-300 today.

## 2020-03-04 NOTE — Assessment & Plan Note (Signed)
History of on past labs, no recent check, due to underlying CKD will check this today and initiate medication if needed.

## 2020-03-04 NOTE — Assessment & Plan Note (Signed)
Ongoing with underlying osteopenia, continue daily supplement and check Vit D level today. 

## 2020-03-04 NOTE — Assessment & Plan Note (Signed)
Noted on recent DEXA -- continue supplements daily and check Vit D level.  Plan for repeat DEXA in 2026.  Discussed fall prevention.

## 2020-03-04 NOTE — Assessment & Plan Note (Signed)
Chronic, stable on current regimen.  Continue current regimen and adjust as needed.  Flonase refills sent.

## 2020-03-04 NOTE — Assessment & Plan Note (Signed)
New diagnosis noted on imaging.  Recent thyroid labs WNL.  Will recheck this today and initiate medication as needed.  Plan for repeat u/s in 2022.

## 2020-03-05 ENCOUNTER — Other Ambulatory Visit: Payer: Self-pay | Admitting: Nurse Practitioner

## 2020-03-05 LAB — CBC WITH DIFFERENTIAL/PLATELET
Basophils Absolute: 0.1 10*3/uL (ref 0.0–0.2)
Basos: 1 %
EOS (ABSOLUTE): 0.1 10*3/uL (ref 0.0–0.4)
Eos: 2 %
Hematocrit: 41.9 % (ref 34.0–46.6)
Hemoglobin: 13.2 g/dL (ref 11.1–15.9)
Immature Grans (Abs): 0 10*3/uL (ref 0.0–0.1)
Immature Granulocytes: 0 %
Lymphocytes Absolute: 2.1 10*3/uL (ref 0.7–3.1)
Lymphs: 36 %
MCH: 29.3 pg (ref 26.6–33.0)
MCHC: 31.5 g/dL (ref 31.5–35.7)
MCV: 93 fL (ref 79–97)
Monocytes Absolute: 0.7 10*3/uL (ref 0.1–0.9)
Monocytes: 12 %
Neutrophils Absolute: 2.9 10*3/uL (ref 1.4–7.0)
Neutrophils: 49 %
Platelets: 209 10*3/uL (ref 150–450)
RBC: 4.5 x10E6/uL (ref 3.77–5.28)
RDW: 13.3 % (ref 11.7–15.4)
WBC: 5.9 10*3/uL (ref 3.4–10.8)

## 2020-03-05 LAB — LIPID PANEL W/O CHOL/HDL RATIO
Cholesterol, Total: 141 mg/dL (ref 100–199)
HDL: 52 mg/dL (ref >39)
LDL Chol Calc (NIH): 62 mg/dL (ref 0–99)
Triglycerides: 160 mg/dL — ABNORMAL HIGH (ref 0–149)
VLDL Cholesterol Cal: 27 mg/dL (ref 5–40)

## 2020-03-05 LAB — COMPREHENSIVE METABOLIC PANEL
ALT: 12 IU/L (ref 0–32)
AST: 18 IU/L (ref 0–40)
Albumin/Globulin Ratio: 1.4 (ref 1.2–2.2)
Albumin: 4.1 g/dL (ref 3.8–4.8)
Alkaline Phosphatase: 101 IU/L (ref 39–117)
BUN/Creatinine Ratio: 18 (ref 12–28)
BUN: 22 mg/dL (ref 8–27)
Bilirubin Total: 0.3 mg/dL (ref 0.0–1.2)
CO2: 19 mmol/L — ABNORMAL LOW (ref 20–29)
Calcium: 9.3 mg/dL (ref 8.7–10.3)
Chloride: 103 mmol/L (ref 96–106)
Creatinine, Ser: 1.19 mg/dL — ABNORMAL HIGH (ref 0.57–1.00)
GFR calc Af Amer: 56 mL/min/{1.73_m2} — ABNORMAL LOW (ref >59)
GFR calc non Af Amer: 48 mL/min/{1.73_m2} — ABNORMAL LOW (ref >59)
Globulin, Total: 2.9 g/dL (ref 1.5–4.5)
Glucose: 94 mg/dL (ref 65–99)
Potassium: 4.3 mmol/L (ref 3.5–5.2)
Sodium: 138 mmol/L (ref 134–144)
Total Protein: 7 g/dL (ref 6.0–8.5)

## 2020-03-05 LAB — TSH: TSH: 1.67 u[IU]/mL (ref 0.450–4.500)

## 2020-03-05 LAB — URIC ACID: Uric Acid: 5.1 mg/dL (ref 3.0–7.2)

## 2020-03-05 NOTE — Progress Notes (Signed)
Contacted via MyChart

## 2020-03-10 ENCOUNTER — Ambulatory Visit (INDEPENDENT_AMBULATORY_CARE_PROVIDER_SITE_OTHER): Payer: Medicare Other | Admitting: General Practice

## 2020-03-10 ENCOUNTER — Telehealth: Payer: Self-pay | Admitting: General Practice

## 2020-03-10 DIAGNOSIS — E785 Hyperlipidemia, unspecified: Secondary | ICD-10-CM

## 2020-03-10 DIAGNOSIS — E1159 Type 2 diabetes mellitus with other circulatory complications: Secondary | ICD-10-CM | POA: Diagnosis not present

## 2020-03-10 DIAGNOSIS — E1169 Type 2 diabetes mellitus with other specified complication: Secondary | ICD-10-CM

## 2020-03-10 DIAGNOSIS — E1121 Type 2 diabetes mellitus with diabetic nephropathy: Secondary | ICD-10-CM

## 2020-03-10 DIAGNOSIS — I152 Hypertension secondary to endocrine disorders: Secondary | ICD-10-CM

## 2020-03-10 DIAGNOSIS — I10 Essential (primary) hypertension: Secondary | ICD-10-CM | POA: Diagnosis not present

## 2020-03-10 DIAGNOSIS — N1831 Chronic kidney disease, stage 3a: Secondary | ICD-10-CM

## 2020-03-10 NOTE — Chronic Care Management (AMB) (Signed)
Chronic Care Management   Follow Up Note   03/10/2020 Name: Kim Thomas MRN: [id number] DOB: 01-30-1956  Referred by: Venita Lick, NP Reason for referral : Chronic Care Management (RNCM Chronic Disease Management and care coordination Needs)   Kim Thomas is a 64 y.o. year old female who is a primary care patient of Cannady, [name], NP. The CCM team was consulted for assistance with chronic disease management and care coordination needs.    Review of patient status, including review of consultants reports, relevant laboratory and other test results, and collaboration with appropriate care team members and the patient's provider was performed as part of comprehensive patient evaluation and provision of chronic care management services.    SDOH (Social Determinants of Health) assessments performed: Yes- the patient is exercising more daily See Care Plan activities for detailed interventions related to [facility])     Outpatient Encounter Medications as of 03/10/2020  Medication Sig Note  . acetaminophen (TYLENOL) 500 MG tablet Take 500 mg by mouth daily as needed. 11/20/2019: Generally taking 500 mg BID, occasionally 1000 mg BID  . atorvastatin (LIPITOR) 20 MG tablet TAKE 1 TABLET BY MOUTH  DAILY   . BLACK CURRANT SEED OIL PO Take 1,250 mg by mouth 2 (two) times daily.   . Blood Glucose Monitoring Suppl (ONE TOUCH ULTRA MINI) w/Device KIT As directed   . CALCIUM CITRATE PO Take 1,000 mg by mouth daily.   . Cholecalciferol (VITAMIN D3) 2000 units TABS Take 2,000 Units by mouth daily.   [name] Co-Enzyme Q-10 100 MG CAPS Take 50 mg by mouth daily.   . cyclobenzaprine (FLEXERIL) 10 MG tablet Take 10 mg by mouth at bedtime. Take 1/2 tablet to 1 as needed  At bed time   . diclofenac sodium (VOLTAREN) 1 % GEL Apply topically.   [name] ALLERGY RELIEF, CETIRIZINE, 10 MG tablet Take 1 tablet by mouth once daily   . fluticasone (FLONASE) 50 MCG/ACT nasal spray USE 1 TO 2 SPRAY(S) IN EACH NOSTRIL TWICE  DAILY   . gabapentin (NEURONTIN) 400 MG capsule Take 1 capsule (400 mg total) by mouth 3 (three) times daily.   [name] glipiZIDE (GLUCOTROL) 5 MG tablet Take 0.5 tablets (2.5 mg total) by mouth daily before breakfast.   . JARDIANCE 25 MG TABS tablet TAKE 1 TABLET BY MOUTH  DAILY   . Lancets (ONETOUCH DELICA PLUS [id number]) MISC 1 each by Does not apply route 2 (two) times daily.   . Multiple Vitamin (MULTIVITAMIN) tablet Take 1 tablet by mouth daily.   . Omega-3 1000 MG CAPS Take 1 capsule by mouth daily.   [name] omeprazole (PRILOSEC) 20 MG capsule Take 1 capsule (20 mg total) by mouth daily.   . ondansetron (ZOFRAN) 4 MG tablet Take 1 tablet (4 mg total) by mouth every 8 (eight) hours as needed for nausea or vomiting.   [name] test strip USE TO CHECK BLOOD SUGAR  DAILY   . Potassium 99 MG TABS Take by mouth.   . telmisartan (MICARDIS) 40 MG tablet Take 1 tablet by mouth once daily   . triamcinolone cream (KENALOG) 0.1 % Apply 1 application topically 2 (two) times daily.   . vitamin E 100 UNIT capsule Take 100 Units by mouth daily.    No facility-administered encounter medications on file as of 03/10/2020.     Objective:  BP Readings from Last 3 Encounters:  03/04/20 113/70  12/03/19 130/75  08/28/19 109/66   Lab Results  Component Value  Date   HGBA1C 7.6 (H) 03/04/2020    Goals Addressed            This Visit's Progress   . RN- Managing chronic diseases (pt-stated)       Current Barriers:  . Chronic Disease Management support, education, and care coordination needs related to HTN, HLD, DMII, and CKD Stage 3 . Does not have a blood pressure cuff-plans to get one soon  Clinical Goal(s) related to HTN, HLD, DMII, and CKD Stage 3 :  Over the next 120 days, patient will:  . Work with the care management team to address educational, disease management, and care coordination needs  . Begin or continue self health monitoring activities as directed today Measure and record cbg  (blood glucose) x1 times daily and Measure and record blood pressure 2 times per week . Call provider office for new or worsened signs and symptoms Blood glucose findings outside established parameters . Call care management team with questions or concerns . Verbalize basic understanding of patient centered plan of care established today  Interventions related to HTN, HLD, DMII, and CKD Stage 3 :  . Evaluation of current treatment plans and patient's adherence to plan as established by provider. The patient has seen the pcp recently.  Review of chronic conditions and plan of care.  . Assessed patient understanding of disease states- the patient desires to have a lower A1C- currently value is 7.6. She is being mindful of her dietary intake. She is eating better and has decided to eat sweets only on Sundays.  Education and support- will send information by my Chart and Emmi on healthy food choices for heart healthy/ADA diet . Assessed patient's education and care coordination needs . Provided disease specific education to patient . Collaborated with appropriate clinical care team members regarding patient needs . Education on the OTC benefit through [facility] and the monitoring program through [facility] where she can get a blood pressure cuff to use to monitor her blood pressures and record on a daily basis- the patient currently does not have a blood pressure cuff, but is aware of the over the counter benefit, the housecalls benefit and has the life alert system   Patient Self Care Activities related to HTN, HLD, DMII, and CKD Stage 3 :  . Patient is unable to independently self-manage chronic health conditions  Please see past updates related to this goal by clicking on the "Past Updates" button in the selected goal          Plan:   Telephone follow up appointment with care management team member scheduled for: 05-12-2020 at 0900   [place], MSN, [facility] Mobile: [phone]

## 2020-03-10 NOTE — Patient Instructions (Signed)
Visit Information  Goals Addressed            This Visit's Progress   . RN- Managing chronic diseases (pt-stated)       Current Barriers:  . Chronic Disease Management support, education, and care coordination needs related to HTN, HLD, DMII, and CKD Stage 3 . Does not have a blood pressure cuff-plans to get one soon  Clinical Goal(s) related to HTN, HLD, DMII, and CKD Stage 3 :  Over the next 120 days, patient will:  . Work with the care management team to address educational, disease management, and care coordination needs  . Begin or continue self health monitoring activities as directed today Measure and record cbg (blood glucose) x1 times daily and Measure and record blood pressure 2 times per week . Call provider office for new or worsened signs and symptoms Blood glucose findings outside established parameters . Call care management team with questions or concerns . Verbalize basic understanding of patient centered plan of care established today  Interventions related to HTN, HLD, DMII, and CKD Stage 3 :  . Evaluation of current treatment plans and patient's adherence to plan as established by provider. The patient has seen the pcp recently.  Review of chronic conditions and plan of care.  . Assessed patient understanding of disease states- the patient desires to have a lower A1C- currently value is 7.6. She is being mindful of her dietary intake. She is eating better and has decided to eat sweets only on Sundays.  Education and support- will send information by my Chart and Emmi on healthy food choices for heart healthy/ADA diet . Assessed patient's education and care coordination needs . Provided disease specific education to patient . Collaborated with appropriate clinical care team members regarding patient needs . Education on the OTC benefit through [facility] and the monitoring program through [facility] where she can get a blood pressure cuff to use to monitor her blood pressures and  record on a daily basis- the patient currently does not have a blood pressure cuff, but is aware of the over the counter benefit, the housecalls benefit and has the life alert system   Patient Self Care Activities related to HTN, HLD, DMII, and CKD Stage 3 :  . Patient is unable to independently self-manage chronic health conditions  Please see past updates related to this goal by clicking on the "Past Updates" button in the selected goal         Patient verbalizes understanding of instructions provided today.   Telephone follow up appointment with care management team member scheduled for: 05-12-2020 at 0900  [place], MSN, [facility] Mobile: [phone]   Carbohydrate Counting for Diabetes Mellitus, Adult  Carbohydrate counting is a method of keeping track of how many carbohydrates you eat. Eating carbohydrates naturally increases the amount of sugar (glucose) in the blood. Counting how many carbohydrates you eat helps keep your blood glucose within normal limits, which helps you manage your diabetes (diabetes mellitus). It is important to know how many carbohydrates you can safely have in each meal. This is different for every person. A diet and nutrition specialist (registered dietitian) can help you make a meal plan and calculate how many carbohydrates you should have at each meal and snack. Carbohydrates are found in the following foods:  Grains, such as breads and cereals.  Dried beans and soy products.  Starchy vegetables, such as potatoes, peas, and  corn.  Fruit and fruit juices.  Milk and yogurt.  Sweets and snack foods, such as cake, cookies, candy, chips, and soft drinks. How do I count carbohydrates? There are two ways to count carbohydrates in food. You can use either of the methods or a combination of both. Reading "Nutrition Facts" on packaged food The "Nutrition Facts" list  is included on the labels of almost all packaged foods and beverages in the [place] It includes:  The serving size.  Information about nutrients in each serving, including the grams (g) of carbohydrate per serving. To use the "Nutrition Facts":  Decide how many servings you will have.  Multiply the number of servings by the number of carbohydrates per serving.  The resulting number is the total amount of carbohydrates that you will be having. Learning standard serving sizes of other foods When you eat carbohydrate foods that are not packaged or do not include "Nutrition Facts" on the label, you need to measure the servings in order to count the amount of carbohydrates:  Measure the foods that you will eat with a food scale or measuring cup, if needed.  Decide how many standard-size servings you will eat.  Multiply the number of servings by 15. Most carbohydrate-rich foods have about 15 g of carbohydrates per serving. ? For example, if you eat 8 oz (170 g) of strawberries, you will have eaten 2 servings and 30 g of carbohydrates (2 servings x 15 g = 30 g).  For foods that have more than one food mixed, such as soups and casseroles, you must count the carbohydrates in each food that is included. The following list contains standard serving sizes of common carbohydrate-rich foods. Each of these servings has about 15 g of carbohydrates:   hamburger bun or  English muffin.   oz (15 mL) syrup.   oz (14 g) jelly.  1 slice of bread.  1 six-inch tortilla.  3 oz (85 g) cooked rice or pasta.  4 oz (113 g) cooked dried beans.  4 oz (113 g) starchy vegetable, such as peas, corn, or potatoes.  4 oz (113 g) hot cereal.  4 oz (113 g) mashed potatoes or  of a large baked potato.  4 oz (113 g) canned or frozen fruit.  4 oz (120 mL) fruit juice.  4-6 crackers.  6 chicken nuggets.  6 oz (170 g) unsweetened dry cereal.  6 oz (170 g) plain fat-free yogurt or yogurt sweetened with  artificial sweeteners.  8 oz (240 mL) milk.  8 oz (170 g) fresh fruit or one small piece of fruit.  24 oz (680 g) popped popcorn. Example of carbohydrate counting Sample meal  3 oz (85 g) chicken breast.  6 oz (170 g) brown rice.  4 oz (113 g) corn.  8 oz (240 mL) milk.  8 oz (170 g) strawberries with sugar-free whipped topping. Carbohydrate calculation 1. Identify the foods that contain carbohydrates: ? Rice. ? Corn. ? Milk. ? Strawberries. 2. Calculate how many servings you have of each food: ? 2 servings rice. ? 1 serving corn. ? 1 serving milk. ? 1 serving strawberries. 3. Multiply each number of servings by 15 g: ? 2 servings rice x 15 g = 30 g. ? 1 serving corn x 15 g = 15 g. ? 1 serving milk x 15 g = 15 g. ? 1 serving strawberries x 15 g = 15 g. 4. Add together all of the amounts to find the total grams of carbohydrates  eaten: ? 30 g + 15 g + 15 g + 15 g = 75 g of carbohydrates total. Summary  Carbohydrate counting is a method of keeping track of how many carbohydrates you eat.  Eating carbohydrates naturally increases the amount of sugar (glucose) in the blood.  Counting how many carbohydrates you eat helps keep your blood glucose within normal limits, which helps you manage your diabetes.  A diet and nutrition specialist (registered dietitian) can help you make a meal plan and calculate how many carbohydrates you should have at each meal and snack. This information is not intended to replace advice given to you by your health care provider. Make sure you discuss any questions you have with your health care provider. Document Revised: 06/08/2017 Document Reviewed: 04/27/2016 Elsevier Patient Education  2020 Elsevier Inc.  Hemoglobin A1c Test Why am I having this test? You may have the hemoglobin A1c test (HbA1c test) done to:  Evaluate your risk for developing diabetes (diabetes mellitus).  Diagnose diabetes.  Monitor long-term control of blood sugar  (glucose) in people who have diabetes and help make treatment decisions. This test may be done with other blood glucose tests, such as fasting blood glucose and oral glucose tolerance tests. What is being tested? Hemoglobin is a type of protein in the blood that carries oxygen. Glucose attaches to hemoglobin to form glycated hemoglobin. This test checks the amount of glycated hemoglobin in your blood, which is a good indicator of the average amount of glucose in your blood during the past 2-3 months. What kind of sample is taken?  A blood sample is required for this test. It is usually collected by inserting a needle into a blood vessel. Tell a health care provider about:  All medicines you are taking, including vitamins, herbs, eye drops, creams, and over-the-counter medicines.  Any blood disorders you have.  Any surgeries you have had.  Any medical conditions you have.  Whether you are pregnant or may be pregnant. How are the results reported? Your results will be reported as a percentage that indicates how much of your hemoglobin has glucose attached to it (is glycated). Your health care provider will compare your results to normal ranges that were established after testing a large group of people (reference ranges). Reference ranges may vary among labs and hospitals. For this test, common reference ranges are:  Adult or child without diabetes: 4-5.6%.  Adult or child with diabetes and good blood glucose control: less than 7%. What do the results mean? If you have diabetes:  A result of less than 7% is considered normal, meaning that your blood glucose is well controlled.  A result higher than 7% means that your blood glucose is not well controlled, and your treatment plan may need to be adjusted. If you do not have diabetes:  A result within the reference range is considered normal, meaning that you are not at high risk for diabetes.  A result of 5.7-6.4% means that you have a  high risk of developing diabetes, and you may have prediabetes. Prediabetes is the condition of having a blood glucose level that is higher than it should be, but not high enough for you to be diagnosed with diabetes. Having prediabetes puts you at risk for developing type 2 diabetes (type 2 diabetes mellitus). You may have more tests, including a repeat HbA1c test.  Results of 6.5% or higher on two separate HbA1c tests mean that you have diabetes. You may have more tests to confirm  the diagnosis. Abnormally low HbA1c values may be caused by:  Pregnancy.  Severe blood loss.  Receiving donated blood (transfusions).  Low red blood cell count (anemia).  Long-term kidney failure.  Some unusual forms (variants) of hemoglobin. Talk with your health care provider about what your results mean. Questions to ask your health care provider Ask your health care provider, or the department that is doing the test:  When will my results be ready?  How will I get my results?  What are my treatment options?  What other tests do I need?  What are my next steps? Summary  The hemoglobin A1c test (HbA1c test) may be done to evaluate your risk for developing diabetes, to diagnose diabetes, and to monitor long-term control of blood sugar (glucose) in people who have diabetes and help make treatment decisions.  Hemoglobin is a type of protein in the blood that carries oxygen. Glucose attaches to hemoglobin to form glycated hemoglobin. This test checks the amount of glycated hemoglobin in your blood, which is a good indicator of the average amount of glucose in your blood during the past 2-3 months.  Talk with your health care provider about what your results mean. This information is not intended to replace advice given to you by your health care provider. Make sure you discuss any questions you have with your health care provider. Document Revised: 10/27/2017 Document Reviewed: 06/27/2017 Elsevier  Patient Education  2020 [employer].  Diabetes Mellitus and Nutrition, Adult When you have diabetes (diabetes mellitus), it is very important to have healthy eating habits because your blood sugar (glucose) levels are greatly affected by what you eat and drink. Eating healthy foods in the appropriate amounts, at about the same times every day, can help you:  Control your blood glucose.  Lower your risk of heart disease.  Improve your blood pressure.  Reach or maintain a healthy weight. Every person with diabetes is different, and each person has different needs for a meal plan. Your health care provider may recommend that you work with a diet and nutrition specialist (dietitian) to make a meal plan that is best for you. Your meal plan may vary depending on factors such as:  The calories you need.  The medicines you take.  Your weight.  Your blood glucose, blood pressure, and cholesterol levels.  Your activity level.  Other health conditions you have, such as heart or kidney disease. How do carbohydrates affect me? Carbohydrates, also called carbs, affect your blood glucose level more than any other type of food. Eating carbs naturally raises the amount of glucose in your blood. Carb counting is a method for keeping track of how many carbs you eat. Counting carbs is important to keep your blood glucose at a healthy level, especially if you use insulin or take certain oral diabetes medicines. It is important to know how many carbs you can safely have in each meal. This is different for every person. Your dietitian can help you calculate how many carbs you should have at each meal and for each snack. Foods that contain carbs include:  Bread, cereal, rice, pasta, and crackers.  Potatoes and corn.  Peas, beans, and lentils.  Milk and yogurt.  Fruit and juice.  Desserts, such as cakes, cookies, ice cream, and candy. How does alcohol affect me? Alcohol can cause a sudden decrease  in blood glucose (hypoglycemia), especially if you use insulin or take certain oral diabetes medicines. Hypoglycemia can be a life-threatening condition. Symptoms of  hypoglycemia (sleepiness, dizziness, and confusion) are similar to symptoms of having too much alcohol. If your health care provider says that alcohol is safe for you, follow these guidelines:  Limit alcohol intake to no more than 1 drink per day for nonpregnant women and 2 drinks per day for men. One drink equals 12 oz of beer, 5 oz of wine, or 1 oz of hard liquor.  Do not drink on an empty stomach.  Keep yourself hydrated with water, diet soda, or unsweetened iced tea.  Keep in mind that regular soda, juice, and other mixers may contain a lot of sugar and must be counted as carbs. What are tips for following this plan?  Reading food labels  Start by checking the serving size on the "Nutrition Facts" label of packaged foods and drinks. The amount of calories, carbs, fats, and other nutrients listed on the label is based on one serving of the item. Many items contain more than one serving per package.  Check the total grams (g) of carbs in one serving. You can calculate the number of servings of carbs in one serving by dividing the total carbs by 15. For example, if a food has 30 g of total carbs, it would be equal to 2 servings of carbs.  Check the number of grams (g) of saturated and trans fats in one serving. Choose foods that have low or no amount of these fats.  Check the number of milligrams (mg) of salt (sodium) in one serving. Most people should limit total sodium intake to less than 2,300 mg per day.  Always check the nutrition information of foods labeled as "low-fat" or "nonfat". These foods may be higher in added sugar or refined carbs and should be avoided.  Talk to your dietitian to identify your daily goals for nutrients listed on the label. Shopping  Avoid buying canned, premade, or processed foods. These  foods tend to be high in fat, sodium, and added sugar.  Shop around the outside edge of the grocery store. This includes fresh fruits and vegetables, bulk grains, fresh meats, and fresh dairy. Cooking  Use low-heat cooking methods, such as baking, instead of high-heat cooking methods like deep frying.  Cook using healthy oils, such as olive, canola, or sunflower oil.  Avoid cooking with butter, cream, or high-fat meats. Meal planning  Eat meals and snacks regularly, preferably at the same times every day. Avoid going long periods of time without eating.  Eat foods high in fiber, such as fresh fruits, vegetables, beans, and whole grains. Talk to your dietitian about how many servings of carbs you can eat at each meal.  Eat 4-6 ounces (oz) of lean protein each day, such as lean meat, chicken, fish, eggs, or tofu. One oz of lean protein is equal to: ? 1 oz of meat, chicken, or fish. ? 1 egg. ?  cup of tofu.  Eat some foods each day that contain healthy fats, such as avocado, nuts, seeds, and fish. Lifestyle  Check your blood glucose regularly.  Exercise regularly as told by your health care provider. This may include: ? 150 minutes of moderate-intensity or vigorous-intensity exercise each week. This could be brisk walking, biking, or water aerobics. ? Stretching and doing strength exercises, such as yoga or weightlifting, at least 2 times a week.  Take medicines as told by your health care provider.  Do not use any products that contain nicotine or tobacco, such as cigarettes and e-cigarettes. If you need help quitting,  ask your health care provider.  Work with a Veterinary surgeon or diabetes educator to identify strategies to manage stress and any emotional and social challenges. Questions to ask a health care provider  Do I need to meet with a diabetes educator?  Do I need to meet with a dietitian?  What number can I call if I have questions?  When are the best times to check my  blood glucose? Where to find more information:  American Diabetes Association: diabetes.org  Academy of Nutrition and Dietetics: www.eatright.[employer] of Diabetes and Digestive and Kidney Diseases (NIH): CarFlippers.tn Summary  A healthy meal plan will help you control your blood glucose and maintain a healthy lifestyle.  Working with a diet and nutrition specialist (dietitian) can help you make a meal plan that is best for you.  Keep in mind that carbohydrates (carbs) and alcohol have immediate effects on your blood glucose levels. It is important to count carbs and to use alcohol carefully. This information is not intended to replace advice given to you by your health care provider. Make sure you discuss any questions you have with your health care provider. Document Revised: 10/27/2017 Document Reviewed: 12/19/2016 Elsevier Patient Education  2020 Elsevier Inc.  DASH Eating Plan DASH stands for "Dietary Approaches to Stop Hypertension." The DASH eating plan is a healthy eating plan that has been shown to reduce high blood pressure (hypertension). It may also reduce your risk for type 2 diabetes, heart disease, and stroke. The DASH eating plan may also help with weight loss. What are tips for following this plan?  General guidelines  Avoid eating more than 2,300 mg (milligrams) of salt (sodium) a day. If you have hypertension, you may need to reduce your sodium intake to 1,500 mg a day.  Limit alcohol intake to no more than 1 drink a day for nonpregnant women and 2 drinks a day for men. One drink equals 12 oz of beer, 5 oz of wine, or 1 oz of hard liquor.  Work with your health care provider to maintain a healthy body weight or to lose weight. Ask what an ideal weight is for you.  Get at least 30 minutes of exercise that causes your heart to beat faster (aerobic exercise) most days of the week. Activities may include walking, swimming, or biking.  Work with your  health care provider or diet and nutrition specialist (dietitian) to adjust your eating plan to your individual calorie needs. Reading food labels   Check food labels for the amount of sodium per serving. Choose foods with less than 5 percent of the Daily Value of sodium. Generally, foods with less than 300 mg of sodium per serving fit into this eating plan.  To find whole grains, look for the word "whole" as the first word in the ingredient list. Shopping  Buy products labeled as "low-sodium" or "no salt added."  Buy fresh foods. Avoid canned foods and premade or frozen meals. Cooking  Avoid adding salt when cooking. Use salt-free seasonings or herbs instead of table salt or sea salt. Check with your health care provider or pharmacist before using salt substitutes.  Do not fry foods. Cook foods using healthy methods such as baking, boiling, grilling, and broiling instead.  Cook with heart-healthy oils, such as olive, canola, soybean, or sunflower oil. Meal planning  Eat a balanced diet that includes: ? 5 or more servings of fruits and vegetables each day. At each meal, try to fill half of your plate  with fruits and vegetables. ? Up to 6-8 servings of whole grains each day. ? Less than 6 oz of lean meat, poultry, or fish each day. A 3-oz serving of meat is about the same size as a deck of cards. One egg equals 1 oz. ? 2 servings of low-fat dairy each day. ? A serving of nuts, seeds, or beans 5 times each week. ? Heart-healthy fats. Healthy fats called Omega-3 fatty acids are found in foods such as flaxseeds and coldwater fish, like sardines, salmon, and mackerel.  Limit how much you eat of the following: ? Canned or prepackaged foods. ? Food that is high in trans fat, such as fried foods. ? Food that is high in saturated fat, such as fatty meat. ? Sweets, desserts, sugary drinks, and other foods with added sugar. ? Full-fat dairy products.  Do not salt foods before eating.  Try  to eat at least 2 vegetarian meals each week.  Eat more home-cooked food and less restaurant, buffet, and fast food.  When eating at a restaurant, ask that your food be prepared with less salt or no salt, if possible. What foods are recommended? The items listed may not be a complete list. Talk with your dietitian about what dietary choices are best for you. Grains Whole-grain or whole-wheat bread. Whole-grain or whole-wheat pasta. Brown rice. Orpah Cobb. Bulgur. Whole-grain and low-sodium cereals. Pita bread. Low-fat, low-sodium crackers. Whole-wheat flour tortillas. Vegetables Fresh or frozen vegetables (raw, steamed, roasted, or grilled). Low-sodium or reduced-sodium tomato and vegetable juice. Low-sodium or reduced-sodium tomato sauce and tomato paste. Low-sodium or reduced-sodium canned vegetables. Fruits All fresh, dried, or frozen fruit. Canned fruit in natural juice (without added sugar). Meat and other protein foods Skinless chicken or [place]. Ground chicken or [place]. Pork with fat trimmed off. Fish and seafood. Egg whites. Dried beans, peas, or lentils. Unsalted nuts, nut butters, and seeds. Unsalted canned beans. Lean cuts of beef with fat trimmed off. Low-sodium, lean deli meat. Dairy Low-fat (1%) or fat-free (skim) milk. Fat-free, low-fat, or reduced-fat cheeses. Nonfat, low-sodium ricotta or cottage cheese. Low-fat or nonfat yogurt. Low-fat, low-sodium cheese. Fats and oils Soft margarine without trans fats. Vegetable oil. Low-fat, reduced-fat, or light mayonnaise and salad dressings (reduced-sodium). Canola, safflower, olive, soybean, and sunflower oils. Avocado. Seasoning and other foods Herbs. Spices. Seasoning mixes without salt. Unsalted popcorn and pretzels. Fat-free sweets. What foods are not recommended? The items listed may not be a complete list. Talk with your dietitian about what dietary choices are best for you. Grains Baked goods made with fat, such as  croissants, muffins, or some breads. Dry pasta or rice meal packs. Vegetables Creamed or fried vegetables. Vegetables in a cheese sauce. Regular canned vegetables (not low-sodium or reduced-sodium). Regular canned tomato sauce and paste (not low-sodium or reduced-sodium). Regular tomato and vegetable juice (not low-sodium or reduced-sodium). Rosita Fire. Olives. Fruits Canned fruit in a light or heavy syrup. Fried fruit. Fruit in cream or butter sauce. Meat and other protein foods Fatty cuts of meat. Ribs. Fried meat. Tomasa Blase. Sausage. Bologna and other processed lunch meats. Salami. Fatback. Hotdogs. Bratwurst. Salted nuts and seeds. Canned beans with added salt. Canned or smoked fish. Whole eggs or egg yolks. Chicken or [place] with skin. Dairy Whole or 2% milk, cream, and half-and-half. Whole or full-fat cream cheese. Whole-fat or sweetened yogurt. Full-fat cheese. Nondairy creamers. Whipped toppings. Processed cheese and cheese spreads. Fats and oils Butter. Stick margarine. Lard. Shortening. Ghee. Bacon fat. Tropical oils, such as coconut,  palm kernel, or palm oil. Seasoning and other foods Salted popcorn and pretzels. Onion salt, garlic salt, seasoned salt, table salt, and sea salt. Worcestershire sauce. Tartar sauce. Barbecue sauce. Teriyaki sauce. Soy sauce, including reduced-sodium. Steak sauce. Canned and packaged gravies. Fish sauce. Oyster sauce. Cocktail sauce. Horseradish that you find on the shelf. Ketchup. Mustard. Meat flavorings and tenderizers. Bouillon cubes. Hot sauce and Tabasco sauce. Premade or packaged marinades. Premade or packaged taco seasonings. Relishes. Regular salad dressings. Where to find more information:  National Heart, Lung, and Blood Institute: PopSteam.is  American Heart Association: www.heart.org Summary  The DASH eating plan is a healthy eating plan that has been shown to reduce high blood pressure (hypertension). It may also reduce your risk for type 2  diabetes, heart disease, and stroke.  With the DASH eating plan, you should limit salt (sodium) intake to 2,300 mg a day. If you have hypertension, you may need to reduce your sodium intake to 1,500 mg a day.  When on the DASH eating plan, aim to eat more fresh fruits and vegetables, whole grains, lean proteins, low-fat dairy, and heart-healthy fats.  Work with your health care provider or diet and nutrition specialist (dietitian) to adjust your eating plan to your individual calorie needs. This information is not intended to replace advice given to you by your health care provider. Make sure you discuss any questions you have with your health care provider. Document Revised: 10/27/2017 Document Reviewed: 11/07/2016 Elsevier Patient Education  2020 [employer].

## 2020-03-19 ENCOUNTER — Ambulatory Visit: Payer: Medicare Other | Admitting: Orthotics

## 2020-03-19 ENCOUNTER — Other Ambulatory Visit: Payer: Self-pay

## 2020-03-19 DIAGNOSIS — M6281 Muscle weakness (generalized): Secondary | ICD-10-CM

## 2020-03-19 DIAGNOSIS — R6889 Other general symptoms and signs: Secondary | ICD-10-CM | POA: Diagnosis not present

## 2020-03-19 DIAGNOSIS — Z9181 History of falling: Secondary | ICD-10-CM

## 2020-03-19 DIAGNOSIS — E1142 Type 2 diabetes mellitus with diabetic polyneuropathy: Secondary | ICD-10-CM

## 2020-03-19 NOTE — Progress Notes (Signed)
Picked up corrected left MBB.

## 2020-03-23 ENCOUNTER — Ambulatory Visit (INDEPENDENT_AMBULATORY_CARE_PROVIDER_SITE_OTHER): Payer: Medicare Other | Admitting: Family Medicine

## 2020-03-23 ENCOUNTER — Encounter: Payer: Self-pay | Admitting: Family Medicine

## 2020-03-23 ENCOUNTER — Other Ambulatory Visit: Payer: Self-pay

## 2020-03-23 DIAGNOSIS — E1121 Type 2 diabetes mellitus with diabetic nephropathy: Secondary | ICD-10-CM

## 2020-03-23 NOTE — Assessment & Plan Note (Signed)
Needs DM shoes- Rx signed today. Call with any concerns. Continue to monitor.

## 2020-03-23 NOTE — Progress Notes (Signed)
BP 122/71 (BP Location: Left Arm, Patient Position: Sitting, Cuff Size: Normal)   Pulse 95   Temp 98.4 F (36.9 C) (Oral)   Wt 213 lb 9.6 oz (96.9 kg)   LMP  (LMP Unknown)   SpO2 96%   BMI 35.54 kg/m    Subjective:    Patient ID: Kim Thomas, female    DOB: [birth year], 64 y.o.   MRN: [id number]  HPI: Kim Thomas is a 64 y.o. female  Chief Complaint  Patient presents with  . Diabetes   Patient is working closely with her PCP on her sugars. Seems to be doing better. She has a pair of diabetic shoes, but they are too hot for the summer time. Needs another pair. She has known hammer toes. She is feeling well with no other concerns or complaints at this time. She is due for a repeat A1c in July.  Relevant past medical, surgical, family and social history reviewed and updated as indicated. Interim medical history since our last visit reviewed. Allergies and medications reviewed and updated.  Review of Systems  Constitutional: Negative.   Respiratory: Negative.   Cardiovascular: Negative.   Gastrointestinal: Negative.   Musculoskeletal: Negative.   Neurological: Negative.   Psychiatric/Behavioral: Negative.     Per HPI unless specifically indicated above     Objective:    BP 122/71 (BP Location: Left Arm, Patient Position: Sitting, Cuff Size: Normal)   Pulse 95   Temp 98.4 F (36.9 C) (Oral)   Wt 213 lb 9.6 oz (96.9 kg)   LMP  (LMP Unknown)   SpO2 96%   BMI 35.54 kg/m   Wt Readings from Last 3 Encounters:  03/23/20 213 lb 9.6 oz (96.9 kg)  03/04/20 212 lb 9.6 oz (96.4 kg)  08/28/19 206 lb (93.4 kg)    Physical Exam Vitals and nursing note reviewed.  Constitutional:      General: She is not in acute distress.    Appearance: Normal appearance. She is not ill-appearing, toxic-appearing or diaphoretic.  HENT:     Head: Normocephalic and atraumatic.     Right Ear: External ear normal.     Left Ear: External ear normal.     Nose: Nose normal.     Mouth/Throat:       Mouth: Mucous membranes are moist.     Pharynx: Oropharynx is clear.  Eyes:     General: No scleral icterus.       Right eye: No discharge.        Left eye: No discharge.     Extraocular Movements: Extraocular movements intact.     Conjunctiva/sclera: Conjunctivae normal.     Pupils: Pupils are equal, round, and reactive to light.  Cardiovascular:     Rate and Rhythm: Normal rate and regular rhythm.     Pulses: Normal pulses.     Heart sounds: Normal heart sounds. No murmur. No friction rub. No gallop.   Pulmonary:     Effort: Pulmonary effort is normal. No respiratory distress.     Breath sounds: Normal breath sounds. No stridor. No wheezing, rhonchi or rales.  Chest:     Chest wall: No tenderness.  Musculoskeletal:        General: Normal range of motion.     Cervical back: Normal range of motion and neck supple.     Comments: Hammer toes bilaterally  Skin:    General: Skin is warm and dry.     Capillary Refill: Capillary refill takes less than  2 seconds.     Coloration: Skin is not jaundiced or pale.     Findings: No bruising, erythema, lesion or rash.  Neurological:     General: No focal deficit present.     Mental Status: She is alert and oriented to person, place, and time. Mental status is at baseline.  Psychiatric:        Mood and Affect: Mood normal.        Behavior: Behavior normal.        Thought Content: Thought content normal.        Judgment: Judgment normal.     Results for orders placed or performed in visit on 03/04/20  CBC with Differential/Platelet  Result Value Ref Range   WBC 5.9 3.4 - 10.8 x10E3/uL   RBC 4.50 3.77 - 5.28 x10E6/uL   Hemoglobin 13.2 11.1 - 15.9 g/dL   Hematocrit 90.2 40.9 - 46.6 %   MCV 93 79 - 97 fL   MCH 29.3 26.6 - 33.0 pg   MCHC 31.5 31.5 - 35.7 g/dL   RDW 73.5 32.9 - 92.4 %   Platelets 209 150 - 450 x10E3/uL   Neutrophils 49 Not Estab. %   Lymphs 36 Not Estab. %   Monocytes 12 Not Estab. %   Eos 2 Not Estab. %   Basos  1 Not Estab. %   Neutrophils Absolute 2.9 1.4 - 7.0 x10E3/uL   Lymphocytes Absolute 2.1 0.7 - 3.1 x10E3/uL   Monocytes Absolute 0.7 0.1 - 0.9 x10E3/uL   EOS (ABSOLUTE) 0.1 0.0 - 0.4 x10E3/uL   Basophils Absolute 0.1 0.0 - 0.2 x10E3/uL   Immature Granulocytes 0 Not Estab. %   Immature Grans (Abs) 0.0 0.0 - 0.1 x10E3/uL  Comprehensive metabolic panel  Result Value Ref Range   Glucose 94 65 - 99 mg/dL   BUN 22 8 - 27 mg/dL   Creatinine, Ser 2.68 (H) 0.57 - 1.00 mg/dL   GFR calc non Af Amer 48 (L) >59 mL/min/1.73   GFR calc Af Amer 56 (L) >59 mL/min/1.73   BUN/Creatinine Ratio 18 12 - 28   Sodium 138 134 - 144 mmol/L   Potassium 4.3 3.5 - 5.2 mmol/L   Chloride 103 96 - 106 mmol/L   CO2 19 (L) 20 - 29 mmol/L   Calcium 9.3 8.7 - 10.3 mg/dL   Total Protein 7.0 6.0 - 8.5 g/dL   Albumin 4.1 3.8 - 4.8 g/dL   Globulin, Total 2.9 1.5 - 4.5 g/dL   Albumin/Globulin Ratio 1.4 1.2 - 2.2   Bilirubin Total 0.3 0.0 - 1.2 mg/dL   Alkaline Phosphatase 101 39 - 117 IU/L   AST 18 0 - 40 IU/L   ALT 12 0 - 32 IU/L  Lipid Panel w/o Chol/HDL Ratio  Result Value Ref Range   Cholesterol, Total 141 100 - 199 mg/dL   Triglycerides 341 (H) 0 - 149 mg/dL   HDL 52 >96 mg/dL   VLDL Cholesterol Cal 27 5 - 40 mg/dL   LDL Chol Calc (NIH) 62 0 - 99 mg/dL  TSH  Result Value Ref Range   TSH 1.670 0.450 - 4.500 uIU/mL  Microalbumin, Urine Waived  Result Value Ref Range   Microalb, Ur Waived 10 0 - 19 mg/L   Creatinine, Urine Waived 50 10 - 300 mg/dL   Microalb/Creat Ratio 30-300 (H) <30 mg/g  Uric acid  Result Value Ref Range   Uric Acid 5.1 3.0 - 7.2 mg/dL  Bayer DCA Hb Q2W Waived  Result Value Ref Range   HB A1C (BAYER DCA - WAIVED) 7.6 (H) <7.0 %      Assessment & Plan:   Problem List Items Addressed This Visit      Endocrine   Type 2 diabetes mellitus with diabetic nephropathy ([place])    Needs DM shoes- Rx signed today. Call with any concerns. Continue to monitor.           Follow up  plan: Return if symptoms worsen or fail to improve.

## 2020-03-24 DIAGNOSIS — M5432 Sciatica, left side: Secondary | ICD-10-CM | POA: Diagnosis not present

## 2020-03-24 DIAGNOSIS — M5416 Radiculopathy, lumbar region: Secondary | ICD-10-CM | POA: Diagnosis not present

## 2020-03-24 DIAGNOSIS — M9905 Segmental and somatic dysfunction of pelvic region: Secondary | ICD-10-CM | POA: Diagnosis not present

## 2020-03-24 DIAGNOSIS — M9903 Segmental and somatic dysfunction of lumbar region: Secondary | ICD-10-CM | POA: Diagnosis not present

## 2020-03-26 ENCOUNTER — Ambulatory Visit (INDEPENDENT_AMBULATORY_CARE_PROVIDER_SITE_OTHER): Payer: Medicare Other | Admitting: Podiatry

## 2020-03-26 ENCOUNTER — Encounter: Payer: Self-pay | Admitting: Podiatry

## 2020-03-26 ENCOUNTER — Other Ambulatory Visit: Payer: Self-pay

## 2020-03-26 DIAGNOSIS — E1142 Type 2 diabetes mellitus with diabetic polyneuropathy: Secondary | ICD-10-CM | POA: Diagnosis not present

## 2020-03-26 DIAGNOSIS — M6281 Muscle weakness (generalized): Secondary | ICD-10-CM

## 2020-03-26 DIAGNOSIS — L84 Corns and callosities: Secondary | ICD-10-CM | POA: Diagnosis not present

## 2020-03-27 ENCOUNTER — Encounter: Payer: Self-pay | Admitting: Podiatry

## 2020-03-27 NOTE — Progress Notes (Signed)
Subjective:  Patient ID: Kim Thomas, female    DOB: 06/19/1956,  MRN: [id number]  Chief Complaint  Patient presents with  . Foot Pain    "its doing a little better"    64 y.o. female presents with the above complaint.  64 y.o. female presents with the above complaint.  Patient presents with a follow-up of generalized left lower extremity.  Patient states the left side has been doing much better.  She has been able to gain some of her strength back to the left side.  Patient was able to obtain [facility] balance bracing that has been able to to let her ambulate without constant feeling of falling down.  She has not had a fall since after obtaining the balance bracing.  She now has a new complaint on the right foot submetatarsal 5 pain.  Patient states the pain started after wearing the balance bracing.  It appears that the pain is getting worse because of excessive pressure to the submetatarsal 5.  Patient states that there is more soreness and sensation since the last time she was here.  She has been seen by spine specialist at [facility].  She has mild stenosis of the lumbar region but nothing that is causing impingement to the left lower extremity.  Therefore no surgical intervention will be needed at this time.  Patient denies any other complaints   Review of Systems: Negative except as noted in the HPI. Denies N/V/F/Ch.  Past Medical History:  Diagnosis Date  . Anxiety   . Chronic kidney disease   . DDD (degenerative disc disease), cervical   . Diabetes mellitus without complication ([place])   . Gout   . Hypertension   . Sleep apnea     Current Outpatient Medications:  .  acetaminophen (TYLENOL) 500 MG tablet, Take 500 mg by mouth daily as needed., Disp: , Rfl:  .  atorvastatin (LIPITOR) 20 MG tablet, TAKE 1 TABLET BY MOUTH  DAILY, Disp: 90 tablet, Rfl: 3 .  BLACK CURRANT SEED OIL PO, Take 1,250 mg by mouth 2 (two) times daily., Disp: , Rfl:  .  Blood Glucose Monitoring Suppl (ONE  TOUCH ULTRA MINI) w/Device KIT, As directed, Disp: 1 kit, Rfl: 2 .  CALCIUM CITRATE PO, Take 1,000 mg by mouth daily., Disp: , Rfl:  .  Cholecalciferol (VITAMIN D3) 2000 units TABS, Take 2,000 Units by mouth daily., Disp: , Rfl:  .  Co-Enzyme Q-10 100 MG CAPS, Take 50 mg by mouth daily., Disp: , Rfl:  .  cyclobenzaprine (FLEXERIL) 10 MG tablet, Take 10 mg by mouth at bedtime. Take 1/2 tablet to 1 as needed  At bed time, Disp: , Rfl:  .  diclofenac sodium (VOLTAREN) 1 % GEL, Apply topically., Disp: , Rfl:  .  EQ ALLERGY RELIEF, CETIRIZINE, 10 MG tablet, Take 1 tablet by mouth once daily, Disp: 90 tablet, Rfl: 0 .  fluticasone (FLONASE) 50 MCG/ACT nasal spray, USE 1 TO 2 SPRAY(S) IN EACH NOSTRIL TWICE DAILY, Disp: 16 g, Rfl: 6 .  gabapentin (NEURONTIN) 400 MG capsule, Take 1 capsule (400 mg total) by mouth 3 (three) times daily., Disp: 270 capsule, Rfl: 2 .  glipiZIDE (GLUCOTROL) 5 MG tablet, Take 0.5 tablets (2.5 mg total) by mouth daily before breakfast., Disp: 30 tablet, Rfl: 11 .  JARDIANCE 25 MG TABS tablet, TAKE 1 TABLET BY MOUTH  DAILY, Disp: 90 tablet, Rfl: 3 .  Lancets (ONETOUCH DELICA PLUS MEBRAX09M) MISC, 1 each by Does not apply route 2 (  two) times daily., Disp: 100 each, Rfl: 12 .  Multiple Vitamin (MULTIVITAMIN) tablet, Take 1 tablet by mouth daily., Disp: , Rfl:  .  Omega-3 1000 MG CAPS, Take 1 capsule by mouth daily., Disp: , Rfl:  .  omeprazole (PRILOSEC) 20 MG capsule, Take 1 capsule (20 mg total) by mouth daily., Disp: 30 capsule, Rfl: 3 .  ondansetron (ZOFRAN) 4 MG tablet, Take 1 tablet (4 mg total) by mouth every 8 (eight) hours as needed for nausea or vomiting. (Patient not taking: Reported on 03/23/2020), Disp: 20 tablet, Rfl: 0 .  ONETOUCH ULTRA test strip, USE TO CHECK BLOOD SUGAR  DAILY, Disp: 100 strip, Rfl: 3 .  Potassium 99 MG TABS, Take by mouth., Disp: , Rfl:  .  telmisartan (MICARDIS) 40 MG tablet, Take 1 tablet by mouth once daily, Disp: 90 tablet, Rfl: 3 .   triamcinolone cream (KENALOG) 0.1 %, Apply 1 application topically 2 (two) times daily., Disp: 30 g, Rfl: 2 .  vitamin E 100 UNIT capsule, Take 100 Units by mouth daily., Disp: , Rfl:   Social History   Tobacco Use  Smoking Status Former Smoker  . Packs/day: 0.25  . Quit date: 05/21/2011  . Years since quitting: 8.8  Smokeless Tobacco Never Used    Allergies  Allergen Reactions  . Morphine And Related Itching    It burned, and made patient itch and go out of her mind.  . Erythromycin     "Been so long ago," patient unable to remember reaction  . Hydrocodone-Acetaminophen Itching   Objective:  There were no vitals filed for this visit. There is no height or weight on file to calculate BMI. Constitutional Well developed. Well nourished.  Vascular Dorsalis pedis pulses palpable bilaterally. Posterior tibial pulses palpable bilaterally. Capillary refill normal to all digits.  No cyanosis or clubbing noted. Pedal hair growth normal.  Neurologic Normal speech. Oriented to person, place, and time. Epicritic sensation to light touch grossly present bilaterally.  Dermatologic Nails well groomed and normal in appearance. No open wounds. Heloma molle noted in the interdigital digital space of the right foot between the fourth and fifth digit mild in nature.  Orthopedic:  Manual muscle testing of anterior compartment is 4 out of 5, posterior superficial and deep compartment is 4 out of 5, lateral compartment is 4 out of 5. Patient's gait was examined.  Patient may have of mild foot drop on the left side.  No other gait abnormalities noted.  Mild antalgic gait noted.  No pain on palpation to the right submetatarsal 5.  No pain with mild range of motion of the fifth digit.  No pain on palpation submetatarsal 4.  No range of motion pain associated with the fourth digit. Negative Mulder's click or signs of neuroma in the fourth interspace   Radiographs: None Assessment:   1. Diabetic  polyneuropathy associated with type 2 diabetes mellitus ([place])   2. Generalized muscle weakness   3. Heloma molle    Plan:  Patient was evaluated and treated and all questions answered.  Right fourth/fifth digit heloma molle -I explained to the patient the etiology of heloma molle and various treatment options were extensively discussed.  It appears that the fifth digit is getting excessive external pressure into the fourth digit leading to pain in between the toes.  Patient has tried toe protector however given the size of the digit the toe protector keeps falling off. -I dispensed toe separators and see if this will allow the  proper pain relief. -Ultimately patient will benefit from shoe gear modification which she is working concurrently with Liliane Channel to obtain shoes with wider toe box.  Intermittent generalized lower extremity left side weakness -Records were reviewed for through the spine specialist done at [facility].  They will hold off on performing any kind of surgical intervention to the lumbar region.  Patient has mild stenosis without nerve impingement.  However, patient's left generalized lower extremity weakness has improved considerably since the last visit.  Patient has obtained [facility] balance bracing which has significantly helped her and had allowed her to ambulate without falling.  Patient is happy with the [facility] bracing.  Right submetatarsal 5 capsulitis -I explained to the patient the etiology of capsulitis and various treatment options associated with it.  I believe this is likely due to compensatory mechanism from the left lower extremity and putting excessive pressure on the right side.  Given that this started after patient obtained [facility] balance bracing, I believe patient will benefit from offloading that area in the brace.   -This appears to be improving with modification [facility] bracing.  Patient has been working separately with Liliane Channel to make modification. -She will also plan on  getting different shoes which are wider in nature that can take some of the pressure away from the digits.     No follow-ups on file.

## 2020-04-08 ENCOUNTER — Other Ambulatory Visit: Payer: Self-pay | Admitting: Nurse Practitioner

## 2020-04-08 NOTE — Telephone Encounter (Signed)
Requested medication (s) are due for refill today: Yes  Requested medication (s) are on the active medication list: Yes  Last refill:  01/15/19  Future visit scheduled: Yes  Notes to clinic:  Prescription has expired.    Requested Prescriptions  Pending Prescriptions Disp Refills   atorvastatin (LIPITOR) 20 MG tablet [Pharmacy Med Name: ATORVASTATIN  20MG   TAB] 90 tablet 3    Sig: TAKE 1 TABLET BY MOUTH  DAILY      Cardiovascular:  Antilipid - Statins Failed - 04/08/2020 12:34 PM      Failed - Triglycerides in normal range and within 360 days    Triglycerides  Date Value Ref Range Status  03/04/2020 160 (H) 0 - 149 mg/dL Final   Triglycerides Piccolo,Waived  Date Value Ref Range Status  05/28/2019 92 <150 mg/dL Final    Comment:                            Normal                   <150                         Borderline High     150 - 199                         High                200 - 499                         Very High                >499           Passed - Total Cholesterol in normal range and within 360 days    Cholesterol, Total  Date Value Ref Range Status  03/04/2020 141 100 - 199 mg/dL Final   Cholesterol Piccolo, Waived  Date Value Ref Range Status  05/28/2019 150 <200 mg/dL Final    Comment:                            Desirable                <200                         Borderline High      200- 239                         High                     >239           Passed - LDL in normal range and within 360 days    LDL Chol Calc (NIH)  Date Value Ref Range Status  03/04/2020 62 0 - 99 mg/dL Final          Passed - HDL in normal range and within 360 days    HDL  Date Value Ref Range Status  03/04/2020 52 >39 mg/dL Final          Passed - Patient is not pregnant      Passed - Valid encounter within last 12 months  Recent Outpatient Visits           2 weeks ago Type 2 diabetes mellitus with diabetic nephropathy, without long-term  current use of insulin ([place])   [place], Megan P, DO   1 month ago Annual physical exam   [place] Cannady, Jolene T, NP   4 months ago Type 2 diabetes mellitus with diabetic nephropathy, without long-term current use of insulin ([place])   [place], Jolene T, NP   7 months ago Type 2 diabetes mellitus with diabetic nephropathy, without long-term current use of insulin ([place])   [place], Jolene T, NP   10 months ago Type 2 diabetes mellitus with diabetic nephropathy, without long-term current use of insulin ([place])   [place], [name], NP       Future Appointments             In 1 month Cannady, [place] T, NP [facility]             Signed Prescriptions Disp Refills   omeprazole (PRILOSEC) 20 MG capsule 90 capsule 0    Sig: TAKE 1 CAPSULE BY MOUTH  DAILY      Gastroenterology: Proton Pump Inhibitors Passed - 04/08/2020 12:34 PM      Passed - Valid encounter within last 12 months    Recent Outpatient Visits           2 weeks ago Type 2 diabetes mellitus with diabetic nephropathy, without long-term current use of insulin ([place])   [place], Megan P, DO   1 month ago Annual physical exam   [place] Cannady, Jolene T, NP   4 months ago Type 2 diabetes mellitus with diabetic nephropathy, without long-term current use of insulin ([place])   [place], Jolene T, NP   7 months ago Type 2 diabetes mellitus with diabetic nephropathy, without long-term current use of insulin ([place])   [place] Cannady, Jolene T, NP   10 months ago Type 2 diabetes mellitus with diabetic nephropathy, without long-term current use of insulin ([place])   [place], [name], NP       Future Appointments             In 1 month Cannady, [name], NP [facility]

## 2020-04-14 ENCOUNTER — Ambulatory Visit (INDEPENDENT_AMBULATORY_CARE_PROVIDER_SITE_OTHER): Payer: Medicare Other | Admitting: Pharmacist

## 2020-04-14 DIAGNOSIS — E1121 Type 2 diabetes mellitus with diabetic nephropathy: Secondary | ICD-10-CM

## 2020-04-14 DIAGNOSIS — E1159 Type 2 diabetes mellitus with other circulatory complications: Secondary | ICD-10-CM

## 2020-04-14 DIAGNOSIS — I10 Essential (primary) hypertension: Secondary | ICD-10-CM | POA: Diagnosis not present

## 2020-04-14 NOTE — Chronic Care Management (AMB) (Signed)
Chronic Care Management   Follow Up Note   04/14/2020 Name: Kim Thomas MRN: [id number] DOB: [birth year]  Referred by: Venita Lick, NP Reason for referral : Chronic Care Management (Medication Management)   Kim Thomas is a 64 y.o. year old female who is a primary care patient of Cannady, [name], NP. The CCM team was consulted for assistance with chronic disease management and care coordination needs.    Contacted patient for medication management.   Review of patient status, including review of consultants reports, relevant laboratory and other test results, and collaboration with appropriate care team members and the patient's provider was performed as part of comprehensive patient evaluation and provision of chronic care management services.    SDOH (Social Determinants of Health) assessments performed: Yes See Care Plan activities for detailed interventions related to [facility])     Outpatient Encounter Medications as of 04/14/2020  Medication Sig Note   glipiZIDE (GLUCOTROL) 5 MG tablet Take 0.5 tablets (2.5 mg total) by mouth daily before breakfast.    JARDIANCE 25 MG TABS tablet TAKE 1 TABLET BY MOUTH  DAILY    ONETOUCH ULTRA test strip USE TO CHECK BLOOD SUGAR  DAILY    acetaminophen (TYLENOL) 500 MG tablet Take 500 mg by mouth daily as needed. 11/20/2019: Generally taking 500 mg BID, occasionally 1000 mg BID   atorvastatin (LIPITOR) 20 MG tablet TAKE 1 TABLET BY MOUTH  DAILY    BLACK CURRANT SEED OIL PO Take 1,250 mg by mouth 2 (two) times daily.    Blood Glucose Monitoring Suppl (ONE TOUCH ULTRA MINI) w/Device KIT As directed    CALCIUM CITRATE PO Take 1,000 mg by mouth daily.    Cholecalciferol (VITAMIN D3) 2000 units TABS Take 2,000 Units by mouth daily.    Co-Enzyme Q-10 100 MG CAPS Take 50 mg by mouth daily.    cyclobenzaprine (FLEXERIL) 10 MG tablet Take 10 mg by mouth at bedtime. Take 1/2 tablet to 1 as needed  At bed time    diclofenac sodium  (VOLTAREN) 1 % GEL Apply topically.    EQ ALLERGY RELIEF, CETIRIZINE, 10 MG tablet Take 1 tablet by mouth once daily    fluticasone (FLONASE) 50 MCG/ACT nasal spray USE 1 TO 2 SPRAY(S) IN EACH NOSTRIL TWICE DAILY    gabapentin (NEURONTIN) 400 MG capsule Take 1 capsule (400 mg total) by mouth 3 (three) times daily.    Lancets (ONETOUCH DELICA PLUS [id number]) MISC 1 each by Does not apply route 2 (two) times daily.    Multiple Vitamin (MULTIVITAMIN) tablet Take 1 tablet by mouth daily.    Omega-3 1000 MG CAPS Take 1 capsule by mouth daily.    omeprazole (PRILOSEC) 20 MG capsule TAKE 1 CAPSULE BY MOUTH  DAILY    ondansetron (ZOFRAN) 4 MG tablet Take 1 tablet (4 mg total) by mouth every 8 (eight) hours as needed for nausea or vomiting. (Patient not taking: Reported on 03/23/2020)    Potassium 99 MG TABS Take by mouth.    telmisartan (MICARDIS) 40 MG tablet Take 1 tablet by mouth once daily    triamcinolone cream (KENALOG) 0.1 % Apply 1 application topically 2 (two) times daily.    vitamin E 100 UNIT capsule Take 100 Units by mouth daily.    No facility-administered encounter medications on file as of 04/14/2020.     Objective:   Goals Addressed            This Visit's Progress     Patient Stated  PharmD "I want to stay healthy" (pt-stated)       CARE PLAN ENTRY (see longtitudinal plan of care for additional care plan information)  Current Barriers:   Polypharmacy; complex patient with multiple comorbidities including T2DM, CKD, peripheral neuropathy, allergies o Reports that she plans on going out to [facility] [place] to visit her daughter and son and their families in the next few months. Waiting on diabetic shoe inserts.   Self-manages medications o T2DM: Most recent A1c 7.6%, currently taking Jardiance 25 mg daily; glipizide 2.5 mg QAM - Glucose readings: fastings and early afternoons ~ 100-110s - Exercise: using exercise bike, stretch bands, and range of motion  exercises - Meal patterns:   Has increased more leafy greens  Decreased sweets intake; limits portion sizes when she does eat some sweets - Renal function limits metformin; hx Ozempic caused GI upset o HTN: telmisartan 40 mg daily - Not checking BP yet o ASCVD risk reduction: atorvastatin 20 mg daily; last LDL at goal <70 o Peripheral neuropathy: gabapentin 400 mg TID; notes that she occasionally skips midday dose because it makes her sleepy o Osteopenia: T score femoral spine -2.2; FRAX risk of major osteoporotic fracture 11%, hip fracture 1.7% - Vitamin D3 2000 units daily o Allergies: cetirizine 10 mg daily PRN, flonase nasal PRN o Supplements: black seed oil, Vitamin E, CoQ10, omega 3 fatty acids    Pharmacist Clinical Goal(s):   Over the next 90 days, patient will work with PharmD and provider towards optimized medication management  Interventions:  Comprehensive medication review performed, medication list updated in electronic medical record  Inter-disciplinary care team collaboration (see longitudinal plan of care)  Encouraged to start checking occasional 2 hour post prandial sugar readings. Providing with blood sugar and blood pressure log to document readings.   Discussed glipizide's impact on prandial sugar readings. Discussed that breakfast is a smaller meal for her. May consider changing glipizide to supper based upon readings, if still needed.   Patient notes she is planning on going out to shop tomorrow, so will pick up a blood pressure machine. Encouraged to start documenting BP readings as well.  Patient Self Care Activities:   Patient will take medications as prescribed  Please see past updates related to this goal by clicking on the "Past Updates" button in the selected goal          Plan:  - Scheduled f/u call in ~ 12 weeks  Catie Darnelle Maffucci, PharmD, [place] [phone]

## 2020-04-14 NOTE — Patient Instructions (Addendum)
[name],   Keep up the great work!  For an A1c of <7%, we are aiming for fasting readings <130 and 2 hour after meal readings <180. Start checking some readings 2 hours after breakfast and 2 hours after supper. If they are >180, you need to think about cutting back on carbohydrate/sugar portion sizes. If consistently elevated, maybe we need to think about adjusting when you take the glipizide.   Start occasionally checking blood pressures at home. I've included columns for you to checking in the morning or the afternoon/evening. Our goal is generally <130/80.   Please call with any questions! Enjoy your time with your family!  Catie Feliz Beam, PharmD [phone]  Visit Information  Goals Addressed            This Visit's Progress     Patient Stated   . PharmD "I want to stay healthy" (pt-stated)       CARE PLAN ENTRY (see longtitudinal plan of care for additional care plan information)  Current Barriers:  . Polypharmacy; complex patient with multiple comorbidities including T2DM, CKD, peripheral neuropathy, allergies o Reports that she plans on going out to [facility] [place] to visit her daughter and son and their families in the next few months. Waiting on diabetic shoe inserts.  . Self-manages medications o T2DM: Most recent A1c 7.6%, currently taking Jardiance 25 mg daily; glipizide 2.5 mg QAM - Glucose readings: fastings and early afternoons ~ 100-110s - Exercise: using exercise bike, stretch bands, and range of motion exercises - Meal patterns:  . Has increased more leafy greens . Decreased sweets intake; limits portion sizes when she does eat some sweets - Renal function limits metformin; hx Ozempic caused GI upset o HTN: telmisartan 40 mg daily - Not checking BP yet o ASCVD risk reduction: atorvastatin 20 mg daily; last LDL at goal <70 o Peripheral neuropathy: gabapentin 400 mg TID; notes that she occasionally skips midday dose because it makes her  sleepy o Osteopenia: T score femoral spine -2.2; FRAX risk of major osteoporotic fracture 11%, hip fracture 1.7% - Vitamin D3 2000 units daily o Allergies: cetirizine 10 mg daily PRN, flonase nasal PRN o Supplements: black seed oil, Vitamin E, CoQ10, omega 3 fatty acids    Pharmacist Clinical Goal(s):  Kim Thomas Over the next 90 days, patient will work with PharmD and provider towards optimized medication management  Interventions: . Comprehensive medication review performed, medication list updated in electronic medical record . Inter-disciplinary care team collaboration (see longitudinal plan of care) . Encouraged to start checking occasional 2 hour post prandial sugar readings. Providing with blood sugar and blood pressure log to document readings.  . Discussed glipizide's impact on prandial sugar readings. Discussed that breakfast is a smaller meal for her. May consider changing glipizide to supper based upon readings, if still needed.  . Patient notes she is planning on going out to shop tomorrow, so will pick up a blood pressure machine. Encouraged to start documenting BP readings as well.  Patient Self Care Activities:  . Patient will take medications as prescribed  Please see past updates related to this goal by clicking on the "Past Updates" button in the selected goal         The patient verbalized understanding of instructions provided today and agreed to receive a mailed copy of patient instruction and/or educational materials.  Plan:  - Scheduled f/u call in ~ 12 weeks  Catie Feliz Beam, PharmD, [facility] Clinical Pharmacist [facility] [phone]

## 2020-04-21 DIAGNOSIS — M5432 Sciatica, left side: Secondary | ICD-10-CM | POA: Diagnosis not present

## 2020-04-21 DIAGNOSIS — M9905 Segmental and somatic dysfunction of pelvic region: Secondary | ICD-10-CM | POA: Diagnosis not present

## 2020-04-21 DIAGNOSIS — M5416 Radiculopathy, lumbar region: Secondary | ICD-10-CM | POA: Diagnosis not present

## 2020-04-21 DIAGNOSIS — M9903 Segmental and somatic dysfunction of lumbar region: Secondary | ICD-10-CM | POA: Diagnosis not present

## 2020-05-01 ENCOUNTER — Ambulatory Visit (INDEPENDENT_AMBULATORY_CARE_PROVIDER_SITE_OTHER): Payer: Medicare Other | Admitting: Orthotics

## 2020-05-01 ENCOUNTER — Other Ambulatory Visit: Payer: Self-pay

## 2020-05-01 DIAGNOSIS — E1142 Type 2 diabetes mellitus with diabetic polyneuropathy: Secondary | ICD-10-CM | POA: Diagnosis not present

## 2020-05-01 DIAGNOSIS — L84 Corns and callosities: Secondary | ICD-10-CM | POA: Diagnosis not present

## 2020-05-01 DIAGNOSIS — M6281 Muscle weakness (generalized): Secondary | ICD-10-CM

## 2020-05-01 DIAGNOSIS — M778 Other enthesopathies, not elsewhere classified: Secondary | ICD-10-CM

## 2020-05-01 NOTE — Progress Notes (Signed)

## 2020-05-07 ENCOUNTER — Encounter: Payer: Self-pay | Admitting: Nurse Practitioner

## 2020-05-12 ENCOUNTER — Telehealth: Payer: Self-pay

## 2020-05-12 ENCOUNTER — Ambulatory Visit: Payer: Self-pay | Admitting: General Practice

## 2020-05-12 NOTE — Chronic Care Management (AMB) (Signed)
  Chronic Care Management   Outreach Note  05/12/2020 Name: Kim Thomas MRN: [id number] DOB: Jun 08, 1956  Referred by: Marjie Skiff, NP Reason for referral : Chronic Care Management (RNCM Chronic disease management and Care coordination needs)   An unsuccessful telephone outreach was attempted today. The patient was referred to the case management team for assistance with care management and care coordination.   Follow Up Plan: A HIPPA compliant phone message was left for the patient providing contact information and requesting a return call.   Alto Denver RN, MSN, CCM Community Care Coordinator [place]  [facility] Mobile: [phone]

## 2020-06-05 ENCOUNTER — Ambulatory Visit: Payer: Medicare Other | Admitting: Nurse Practitioner

## 2020-06-09 ENCOUNTER — Ambulatory Visit: Payer: Medicare Other | Admitting: Nurse Practitioner

## 2020-06-14 ENCOUNTER — Other Ambulatory Visit: Payer: Self-pay | Admitting: Nurse Practitioner

## 2020-06-14 NOTE — Telephone Encounter (Signed)
Requested Prescriptions  Pending Prescriptions Disp Refills  . omeprazole (PRILOSEC) 20 MG capsule [Pharmacy Med Name: OMEPRAZOLE  20MG   CAP] 90 capsule 3    Sig: TAKE 1 CAPSULE BY MOUTH  DAILY     Gastroenterology: Proton Pump Inhibitors Passed - 06/14/2020  7:05 AM      Passed - Valid encounter within last 12 months    Recent Outpatient Visits          2 months ago Type 2 diabetes mellitus with diabetic nephropathy, without long-term current use of insulin (HCC)   [facility] 06/16/2020, Megan P, DO   3 months ago Annual physical exam   [facility] Cannady, Jolene T, NP   6 months ago Type 2 diabetes mellitus with diabetic nephropathy, without long-term current use of insulin (HCC)   [facility] [place], Jolene T, NP   9 months ago Type 2 diabetes mellitus with diabetic nephropathy, without long-term current use of insulin (HCC)   [facility] [place], [place] T, NP   1 year ago Type 2 diabetes mellitus with diabetic nephropathy, without long-term current use of insulin (HCC)   [facility] [place], [place], NP

## 2020-06-23 ENCOUNTER — Telehealth: Payer: Self-pay | Admitting: General Practice

## 2020-06-23 ENCOUNTER — Telehealth: Payer: Self-pay

## 2020-06-23 NOTE — Telephone Encounter (Signed)
  Chronic Care Management   Outreach Note  06/23/2020 Name: Kim Thomas MRN: [id number] DOB: 1956/08/22  Referred by: Marjie Skiff, NP Reason for referral : Appointment (Scheduled RNCM follow up appointment: 2nd attempt RNCM Chronic Disease Management and Care Coordination Needs)   A second unsuccessful telephone outreach was attempted today. The patient was referred to the case management team for assistance with care management and care coordination.   Follow Up Plan: A HIPPA compliant phone message was left for the patient providing contact information and requesting a return call.  Alto Denver RN, MSN, CCM Community Care Coordinator [place]  [facility] Mobile: [phone]

## 2020-06-24 ENCOUNTER — Telehealth: Payer: Self-pay

## 2020-06-24 NOTE — Chronic Care Management (AMB) (Signed)
  Care Management   Note  06/24/2020 Name: [name] MRN: [id number] DOB: [birth year]  Kim Thomas is a 64 y.o. year old female who is a primary care patient of Marjie Skiff, NP and is actively engaged with the care management team. I reached out to Elyn Aquas by phone today to assist with re-scheduling a follow up visit with the RN Case Manager  Follow up plan: Unsuccessful telephone outreach attempt made. A HIPPA compliant phone message was left for the patient providing contact information and requesting a return call.  The care management team will reach out to the patient again over the next 7 days.  If patient returns call to provider office, please advise to call Embedded Care Management Care Guide Penne Lash  at [phone]  Penne Lash, RMA Care Guide, Embedded Care Coordination [facility]  [place], [place] [address] Direct Dial: [phone] [email or web address]: [email or web address]

## 2020-06-25 NOTE — Telephone Encounter (Signed)
Patient called back stated that she is out of town on vacation and was wondering why she was been contacted by people she does not know I tried explaining to her but she was a bit confused she will contact Pam when she get back in [place].

## 2020-06-25 NOTE — Telephone Encounter (Signed)
FYI Pt called back, Pt will call you when she returns to [facility]

## 2020-06-29 NOTE — Chronic Care Management (AMB) (Signed)
°  Care Management   Note  06/29/2020 Name: Zari Cly MRN: [id number] DOB: February 29, 1956  Kim Thomas is a 64 y.o. year old female who is a primary care patient of Marjie Skiff, NP and is actively engaged with the care management team. I reached out to Elyn Aquas by phone today to assist with re-scheduling a follow up visit with the RN Case Manager  Follow up plan: Telephone appointment with care management team member scheduled for:09/01/2020  Penne Lash, RMA Care Guide, Embedded Care Coordination [facility]  [place], [place] [address] Direct Dial: [phone] [email or web address]: [email or web address]

## 2020-06-29 NOTE — Progress Notes (Signed)
Per Pam place pt on schedule in October, due to pt being upset that I called to r/s appt

## 2020-07-07 ENCOUNTER — Telehealth: Payer: Self-pay

## 2020-07-13 ENCOUNTER — Ambulatory Visit (INDEPENDENT_AMBULATORY_CARE_PROVIDER_SITE_OTHER): Payer: Medicare Other | Admitting: Family Medicine

## 2020-07-13 ENCOUNTER — Encounter: Payer: Self-pay | Admitting: Family Medicine

## 2020-07-13 ENCOUNTER — Other Ambulatory Visit: Payer: Self-pay

## 2020-07-13 VITALS — BP 116/75 | HR 87 | Temp 98.4°F | Wt 203.0 lb

## 2020-07-13 DIAGNOSIS — M79661 Pain in right lower leg: Secondary | ICD-10-CM

## 2020-07-13 MED ORDER — TRAMADOL HCL 50 MG PO TABS: 50.0000 mg | ORAL_TABLET | Freq: Three times a day (TID) | ORAL | 0 refills | Status: AC | PRN

## 2020-07-13 MED ORDER — SULFAMETHOXAZOLE-TRIMETHOPRIM 800-160 MG PO TABS: 1.0000 | ORAL_TABLET | Freq: Two times a day (BID) | ORAL | 0 refills | Status: DC

## 2020-07-13 MED ORDER — KETOROLAC TROMETHAMINE 60 MG/2ML IM SOLN
60.0000 mg | Freq: Once | INTRAMUSCULAR | Status: AC
  Administered 2020-07-13: 60 mg via INTRAMUSCULAR

## 2020-07-13 NOTE — Progress Notes (Signed)
BP 116/75   Pulse 87   Temp 98.4 F (36.9 C) (Oral)   Wt 203 lb (92.1 kg)   LMP  (LMP Unknown)   SpO2 96%   BMI 33.78 kg/m    Subjective:    Patient ID: Kim Thomas, female    DOB: May 26, 1956, 64 y.o.   MRN: [id number]  HPI: Berdene Askari is a 64 y.o. female  Chief Complaint  Patient presents with  . Edema    right calf warm and painfull since last friday   First noticed a warm knot about the size of a golf ball on back of right leg that was tender about 3 days ago. Area became larger and more painful the next day, then started to limp. There are certain positions that relieve the pain but other positions, particularly walking and weight bearing worsens. Has been taking tylenol twice daily without relief of pain. Had worn ted hose for a bit but they became too tight. No known injury to area, fever, chills, sweats, CP, SOB. Strong fhx of DVT, and also a personal history of them - states this doesn't feel consistent and she does take an aspirin daily.   Relevant past medical, surgical, family and social history reviewed and updated as indicated. Interim medical history since our last visit reviewed. Allergies and medications reviewed and updated.  Review of Systems  Per HPI unless specifically indicated above     Objective:    BP 116/75   Pulse 87   Temp 98.4 F (36.9 C) (Oral)   Wt 203 lb (92.1 kg)   LMP  (LMP Unknown)   SpO2 96%   BMI 33.78 kg/m   Wt Readings from Last 3 Encounters:  07/13/20 203 lb (92.1 kg)  03/23/20 213 lb 9.6 oz (96.9 kg)  03/04/20 212 lb 9.6 oz (96.4 kg)    Physical Exam Vitals and nursing note reviewed.  Constitutional:      Appearance: Normal appearance. She is not ill-appearing.  HENT:     Head: Atraumatic.  Eyes:     Extraocular Movements: Extraocular movements intact.     Conjunctiva/sclera: Conjunctivae normal.  Cardiovascular:     Rate and Rhythm: Normal rate and regular rhythm.     Heart sounds: Normal heart sounds.    Pulmonary:     Effort: Pulmonary effort is normal.     Breath sounds: Normal breath sounds.  Musculoskeletal:        General: Swelling (area of redness, tenderness and firmness right posterior calf, about 2 - 2.5 cm diameter) and tenderness (diffuse area of ttp where swollen right posterior calf) present. Normal range of motion.     Cervical back: Normal range of motion and neck supple.     Comments: Tender to squeeze pressure, and pain exacerbated with dorsiflexion of right foot  Skin:    General: Skin is warm and dry.  Neurological:     Mental Status: She is alert and oriented to person, place, and time.  Psychiatric:        Mood and Affect: Mood normal.        Thought Content: Thought content normal.        Judgment: Judgment normal.     Results for orders placed or performed in visit on 03/04/20  CBC with Differential/Platelet  Result Value Ref Range   WBC 5.9 3.4 - 10.8 x10E3/uL   RBC 4.50 3.77 - 5.28 x10E6/uL   Hemoglobin 13.2 11.1 - 15.9 g/dL   Hematocrit 27.2 53.6 -  46.6 %   MCV 93 79 - 97 fL   MCH 29.3 26.6 - 33.0 pg   MCHC 31.5 31 - 35 g/dL   RDW 46.9 62.9 - 52.8 %   Platelets 209 150 - 450 x10E3/uL   Neutrophils 49 Not Estab. %   Lymphs 36 Not Estab. %   Monocytes 12 Not Estab. %   Eos 2 Not Estab. %   Basos 1 Not Estab. %   Neutrophils Absolute 2.9 1 - 7 x10E3/uL   Lymphocytes Absolute 2.1 0 - 3 x10E3/uL   Monocytes Absolute 0.7 0 - 0 x10E3/uL   EOS (ABSOLUTE) 0.1 0.0 - 0.4 x10E3/uL   Basophils Absolute 0.1 0 - 0 x10E3/uL   Immature Granulocytes 0 Not Estab. %   Immature Grans (Abs) 0.0 0.0 - 0.1 x10E3/uL  Comprehensive metabolic panel  Result Value Ref Range   Glucose 94 65 - 99 mg/dL   BUN 22 8 - 27 mg/dL   Creatinine, Ser 4.13 (H) 0.57 - 1.00 mg/dL   GFR calc non Af Amer 48 (L) >59 mL/min/1.73   GFR calc Af Amer 56 (L) >59 mL/min/1.73   BUN/Creatinine Ratio 18 12 - 28   Sodium 138 134 - 144 mmol/L   Potassium 4.3 3.5 - 5.2 mmol/L   Chloride 103 96  - 106 mmol/L   CO2 19 (L) 20 - 29 mmol/L   Calcium 9.3 8.7 - 10.3 mg/dL   Total Protein 7.0 6.0 - 8.5 g/dL   Albumin 4.1 3.8 - 4.8 g/dL   Globulin, Total 2.9 1.5 - 4.5 g/dL   Albumin/Globulin Ratio 1.4 1.2 - 2.2   Bilirubin Total 0.3 0.0 - 1.2 mg/dL   Alkaline Phosphatase 101 39 - 117 IU/L   AST 18 0 - 40 IU/L   ALT 12 0 - 32 IU/L  Lipid Panel w/o Chol/HDL Ratio  Result Value Ref Range   Cholesterol, Total 141 100 - 199 mg/dL   Triglycerides 244 (H) 0 - 149 mg/dL   HDL 52 >01 mg/dL   VLDL Cholesterol Cal 27 5 - 40 mg/dL   LDL Chol Calc (NIH) 62 0 - 99 mg/dL  TSH  Result Value Ref Range   TSH 1.670 0.450 - 4.500 uIU/mL  Microalbumin, Urine Waived  Result Value Ref Range   Microalb, Ur Waived 10 0 - 19 mg/L   Creatinine, Urine Waived 50 10 - 300 mg/dL   Microalb/Creat Ratio 30-300 (H) <30 mg/g  Uric acid  Result Value Ref Range   Uric Acid 5.1 3.0 - 7.2 mg/dL  Bayer DCA Hb U2V Waived  Result Value Ref Range   HB A1C (BAYER DCA - WAIVED) 7.6 (H) <7.0 %      Assessment & Plan:   Problem List Items Addressed This Visit    None    Visit Diagnoses    Right calf pain    -  Primary   Exam more consistent w/ cellulitis, but given hx and location of sxs will order DVT r/o u/s. Abx, tramadol prn, IM toradol. Strict ER precautions if worsening.   Relevant Medications   ketorolac (TORADOL) injection 60 mg   Other Relevant Orders   US Venous Img Lower Unilateral Right (DVT)       Follow up plan: Return for as scheduled next week.

## 2020-07-15 ENCOUNTER — Other Ambulatory Visit: Payer: Self-pay

## 2020-07-15 DIAGNOSIS — I10 Essential (primary) hypertension: Secondary | ICD-10-CM

## 2020-07-15 MED ORDER — TELMISARTAN 40 MG PO TABS: 40.0000 mg | ORAL_TABLET | Freq: Every day | ORAL | 3 refills | Status: DC

## 2020-07-15 NOTE — Telephone Encounter (Signed)
Patient need RX sent to mail order instead of local pharmacy

## 2020-07-17 ENCOUNTER — Encounter: Payer: Self-pay | Admitting: Family Medicine

## 2020-07-17 ENCOUNTER — Encounter: Payer: Self-pay | Admitting: Nurse Practitioner

## 2020-07-17 DIAGNOSIS — E669 Obesity, unspecified: Secondary | ICD-10-CM | POA: Insufficient documentation

## 2020-07-21 ENCOUNTER — Other Ambulatory Visit: Payer: Self-pay

## 2020-07-21 ENCOUNTER — Encounter: Payer: Self-pay | Admitting: Nurse Practitioner

## 2020-07-21 ENCOUNTER — Ambulatory Visit (INDEPENDENT_AMBULATORY_CARE_PROVIDER_SITE_OTHER): Payer: Medicare Other | Admitting: Nurse Practitioner

## 2020-07-21 VITALS — BP 112/71 | HR 86 | Temp 98.4°F | Wt 203.2 lb

## 2020-07-21 DIAGNOSIS — E785 Hyperlipidemia, unspecified: Secondary | ICD-10-CM

## 2020-07-21 DIAGNOSIS — N1831 Chronic kidney disease, stage 3a: Secondary | ICD-10-CM | POA: Diagnosis not present

## 2020-07-21 DIAGNOSIS — L03115 Cellulitis of right lower limb: Secondary | ICD-10-CM

## 2020-07-21 DIAGNOSIS — E1159 Type 2 diabetes mellitus with other circulatory complications: Secondary | ICD-10-CM

## 2020-07-21 DIAGNOSIS — I10 Essential (primary) hypertension: Secondary | ICD-10-CM

## 2020-07-21 DIAGNOSIS — E6609 Other obesity due to excess calories: Secondary | ICD-10-CM

## 2020-07-21 DIAGNOSIS — E1121 Type 2 diabetes mellitus with diabetic nephropathy: Secondary | ICD-10-CM | POA: Diagnosis not present

## 2020-07-21 DIAGNOSIS — Z6833 Body mass index (BMI) 33.0-33.9, adult: Secondary | ICD-10-CM

## 2020-07-21 DIAGNOSIS — E1169 Type 2 diabetes mellitus with other specified complication: Secondary | ICD-10-CM

## 2020-07-21 LAB — BAYER DCA HB A1C WAIVED: HB A1C (BAYER DCA - WAIVED): 6 % (ref <7.0)

## 2020-07-21 NOTE — Assessment & Plan Note (Signed)
BMI 33.81.  Praised for 10 pound weight loss.  Recommended eating smaller high protein, low fat meals more frequently and exercising 30 mins a day 5 times a week with a goal of 10-15lb weight loss in the next 3 months. Patient voiced their understanding and motivation to adhere to these recommendations.

## 2020-07-21 NOTE — Assessment & Plan Note (Signed)
Chronic, ongoing.  A1C today 6%, downward from previous 7.6%, praised for success and 10 pound weight loss.  Continue Jardiance at this time with goal to someday d/c if possible + Glipizide on her current schedule.  Continue increased diet focus and monitoring BS twice a day.  Recommend focus on healthy weight loss and exercise daily. Return in 3 months.

## 2020-07-21 NOTE — Assessment & Plan Note (Signed)
Chronic, ongoing.  Continue current medication regimen and adjust as needed.  Lipid panel next visit.    

## 2020-07-21 NOTE — Assessment & Plan Note (Signed)
Chronic, ongoing.  Continue Telmisartan for kidney protection.  Check BMP today.  Refer to nephrology if decline noted in function.

## 2020-07-21 NOTE — Progress Notes (Signed)
BP 112/71    Pulse 86    Temp 98.4 F (36.9 C) (Oral)    Wt 203 lb 3.2 oz (92.2 kg)    LMP  (LMP Unknown)    SpO2 96%    BMI 33.81 kg/m    Subjective:    Patient ID: Kim Thomas, female    DOB: 1956/11/22, 64 y.o.   MRN: [id number]  HPI: Kim Thomas is a 64 y.o. female  Chief Complaint  Patient presents with   Diabetes   Hyperlipidemia   Hypertension   Wound Check   DIABETES Continues on Glipizide 2.5 MG (taking 3-4 times a week, not daily) and Jardiance 25 MG daily. April A1C 7.6%.Her goal is to come completely off medication, she reports she has been focusing on diet more now since her education.  Went on vacation to see kids, was gone for two months, diet was a bit off with this time away -- [facility] and [place]. Hypoglycemic episodes:no Polydipsia/polyuria: no Visual disturbance: no Chest pain: no Paresthesias: no Glucose Monitoring: yes             Accucheck frequency: not checking, was on vacation             Fasting glucose:              Post prandial:             Evening:              Before meals:  Taking Insulin?: no             Long acting insulin:             Short acting insulin: Blood Pressure Monitoring: not checking Retinal Examination: Not Up to Date Foot Exam: Up to Date Diabetic Education: Completed Pneumovax: refused Influenza: refused Aspirin: no   HYPERTENSION / HYPERLIPIDEMIA Continues on Atorvastatin 20 MG daily and Telmisartan 40 MG.She would like to come off of medications someday, which provider discussed at length with her today. Her goal is to focus more on natural supplements someday.  Her last LDL in April -- 62. Satisfied with current treatment? yes Duration of hypertension: chronic BP monitoring frequency: not checking BP range:  BP medication side effects: no Duration of hyperlipidemia: chronic Cholesterol medication side effects: no Cholesterol supplements: none Medication compliance: good compliance Aspirin:  no Recent stressors: no Recurrent headaches: no Visual changes: no Palpitations: no Dyspnea: no Chest pain: no Lower extremity edema: no Dizzy/lightheaded: no  CHRONIC KIDNEY DISEASE Last labs in April -- CRT 1.19 and GFR 56, BUN 22. CKD status: stable Medications renally dose: yes Previous renal evaluation: yes Pneumovax:  Not up to Date Influenza Vaccine:  Not up to Date   SKIN INFECTION Was treated on 07/13/20 for cellulitis to posterior right calf after insect bite.  Finished treatment last night.  Reports this is feeling better, just a little tender, but no more warmth or or color change. Pain: no Redness: no Swelling: no Oozing: no Pus: no Fevers: no Nausea/vomiting: no Status: better Treatments attempted:antibiotics and warm compresses  Tetanus: UTD  Relevant past medical, surgical, family and social history reviewed and updated as indicated. Interim medical history since our last visit reviewed. Allergies and medications reviewed and updated.  Review of Systems  Constitutional: Negative for activity change, appetite change, diaphoresis, fatigue and fever.  Respiratory: Negative for cough, chest tightness and shortness of breath.   Cardiovascular: Negative for chest pain, palpitations and leg swelling.  Gastrointestinal:  Negative.   Endocrine: Negative for cold intolerance, heat intolerance, polydipsia, polyphagia and polyuria.  Neurological: Negative.   Psychiatric/Behavioral: Negative.     Per HPI unless specifically indicated above     Objective:    BP 112/71    Pulse 86    Temp 98.4 F (36.9 C) (Oral)    Wt 203 lb 3.2 oz (92.2 kg)    LMP  (LMP Unknown)    SpO2 96%    BMI 33.81 kg/m   Wt Readings from Last 3 Encounters:  07/21/20 203 lb 3.2 oz (92.2 kg)  07/13/20 203 lb (92.1 kg)  03/23/20 213 lb 9.6 oz (96.9 kg)    Physical Exam Vitals and nursing note reviewed.  Constitutional:      General: She is awake. She is not in acute distress.     Appearance: She is well-developed. She is obese. She is not ill-appearing.  HENT:     Head: Normocephalic.     Right Ear: Hearing normal.     Left Ear: Hearing normal.  Eyes:     General: Lids are normal.        Right eye: No discharge.        Left eye: No discharge.     Conjunctiva/sclera: Conjunctivae normal.     Pupils: Pupils are equal, round, and reactive to light.  Neck:     Vascular: No carotid bruit.  Cardiovascular:     Rate and Rhythm: Normal rate and regular rhythm.     Heart sounds: Normal heart sounds. No murmur heard.  No gallop.   Pulmonary:     Effort: Pulmonary effort is normal. No accessory muscle usage or respiratory distress.     Breath sounds: Normal breath sounds.  Abdominal:     General: Bowel sounds are normal.     Palpations: Abdomen is soft.  Musculoskeletal:     Cervical back: Normal range of motion and neck supple.     Right lower leg: No edema.     Left lower leg: No edema.  Skin:    General: Skin is warm and dry.       Neurological:     Mental Status: She is alert and oriented to person, place, and time.  Psychiatric:        Attention and Perception: Attention normal.        Mood and Affect: Mood normal.        Speech: Speech normal.        Behavior: Behavior normal. Behavior is cooperative.        Thought Content: Thought content normal.    Results for orders placed or performed in visit on 03/04/20  CBC with Differential/Platelet  Result Value Ref Range   WBC 5.9 3.4 - 10.8 x10E3/uL   RBC 4.50 3.77 - 5.28 x10E6/uL   Hemoglobin 13.2 11.1 - 15.9 g/dL   Hematocrit 32.4 40.1 - 46.6 %   MCV 93 79 - 97 fL   MCH 29.3 26.6 - 33.0 pg   MCHC 31.5 31 - 35 g/dL   RDW 02.7 25.3 - 66.4 %   Platelets 209 150 - 450 x10E3/uL   Neutrophils 49 Not Estab. %   Lymphs 36 Not Estab. %   Monocytes 12 Not Estab. %   Eos 2 Not Estab. %   Basos 1 Not Estab. %   Neutrophils Absolute 2.9 1 - 7 x10E3/uL   Lymphocytes Absolute 2.1 0 - 3 x10E3/uL    Monocytes Absolute 0.7 0 -  0 x10E3/uL   EOS (ABSOLUTE) 0.1 0.0 - 0.4 x10E3/uL   Basophils Absolute 0.1 0 - 0 x10E3/uL   Immature Granulocytes 0 Not Estab. %   Immature Grans (Abs) 0.0 0.0 - 0.1 x10E3/uL  Comprehensive metabolic panel  Result Value Ref Range   Glucose 94 65 - 99 mg/dL   BUN 22 8 - 27 mg/dL   Creatinine, Ser [phone] (H) 0.57 - 1.00 mg/dL   GFR calc non Af Amer 48 (L) >59 mL/min/1.73   GFR calc Af Amer 56 (L) >59 mL/min/1.73   BUN/Creatinine Ratio 18 12 - 28   Sodium 138 134 - 144 mmol/L   Potassium 4.3 3.5 - 5.2 mmol/L   Chloride 103 96 - 106 mmol/L   CO2 19 (L) 20 - 29 mmol/L   Calcium 9.3 8.7 - 10.3 mg/dL   Total Protein 7.0 6.0 - 8.5 g/dL   Albumin 4.1 3.8 - 4.8 g/dL   Globulin, Total 2.9 1.5 - 4.5 g/dL   Albumin/Globulin Ratio 1.4 1.2 - 2.2   Bilirubin Total 0.3 0.0 - 1.2 mg/dL   Alkaline Phosphatase 101 39 - 117 IU/L   AST 18 0 - 40 IU/L   ALT 12 0 - 32 IU/L  Lipid Panel w/o Chol/HDL Ratio  Result Value Ref Range   Cholesterol, Total 141 100 - 199 mg/dL   Triglycerides 213160 (H) 0 - 149 mg/dL   HDL 52 >08>39 mg/dL   VLDL Cholesterol Cal 27 5 - 40 mg/dL   LDL Chol Calc (NIH) 62 0 - 99 mg/dL  TSH  Result Value Ref Range   TSH 1.670 0.450 - 4.500 uIU/mL  Microalbumin, Urine Waived  Result Value Ref Range   Microalb, Ur Waived 10 0 - 19 mg/L   Creatinine, Urine Waived 50 10 - 300 mg/dL   Microalb/Creat Ratio 30-300 (H) <30 mg/g  Uric acid  Result Value Ref Range   Uric Acid 5.1 3.0 - 7.2 mg/dL  Bayer DCA Hb M5HA1c Waived  Result Value Ref Range   HB A1C (BAYER DCA - WAIVED) 7.6 (H) <7.0 %      Assessment & Plan:   Problem List Items Addressed This Visit      Cardiovascular and Mediastinum   Hypertension associated with diabetes (HCC) - Primary    Chronic, ongoing.  Below goal with BP today.  Recommend she check BP at home three mornings a week and document for provider.  Continue current medication regimen and adjust as needed.  BMP today.  Educated her on  Telmisartan and kidney benefit.  Urine micro alb 10 and A:C 30-300 at beginning of year.  Focus on DASH diet at home.  Return in 3 months.      Relevant Orders   Basic metabolic panel     Endocrine   Type 2 diabetes mellitus with diabetic nephropathy (HCC)    Chronic, ongoing.  A1C today 6%, downward from previous 7.6%, praised for success and 10 pound weight loss.  Continue Jardiance at this time with goal to someday d/c if possible + Glipizide on her current schedule.  Continue increased diet focus and monitoring BS twice a day.  Recommend focus on healthy weight loss and exercise daily. Return in 3 months.      Relevant Orders   Basic metabolic panel   Bayer DCA Hb Q4OA1c Waived   Hyperlipidemia associated with type 2 diabetes mellitus (HCC)    Chronic, ongoing.  Continue current medication regimen and adjust as needed.  Lipid  panel next visit.        Genitourinary   CKD (chronic kidney disease), stage III    Chronic, ongoing.  Continue Telmisartan for kidney protection.  Check BMP today.  Refer to nephrology if decline noted in function.        Other   Obesity    BMI 33.81.  Praised for 10 pound weight loss.  Recommended eating smaller high protein, low fat meals more frequently and exercising 30 mins a day 5 times a week with a goal of 10-15lb weight loss in the next 3 months. Patient voiced their understanding and motivation to adhere to these recommendations.        Other Visit Diagnoses    Cellulitis of right lower extremity       Improved at this time, recommend she contine to monitor and return if symptoms return or any changes noted.       Follow up plan: Return in about 3 months (around 10/21/2020) for T2DM, HTN/HLD.

## 2020-07-21 NOTE — Patient Instructions (Signed)

## 2020-07-21 NOTE — Assessment & Plan Note (Signed)
Chronic, ongoing.  Below goal with BP today.  Recommend she check BP at home three mornings a week and document for provider.  Continue current medication regimen and adjust as needed.  BMP today.  Educated her on Telmisartan and kidney benefit.  Urine micro alb 10 and A:C 30-300 at beginning of year.  Focus on DASH diet at home.  Return in 3 months.

## 2020-07-22 LAB — BASIC METABOLIC PANEL
BUN/Creatinine Ratio: 11 — ABNORMAL LOW (ref 12–28)
BUN: 18 mg/dL (ref 8–27)
CO2: 19 mmol/L — ABNORMAL LOW (ref 20–29)
Calcium: 9.2 mg/dL (ref 8.7–10.3)
Chloride: 103 mmol/L (ref 96–106)
Creatinine, Ser: 1.61 mg/dL — ABNORMAL HIGH (ref 0.57–1.00)
GFR calc Af Amer: 39 mL/min/{1.73_m2} — ABNORMAL LOW (ref >59)
GFR calc non Af Amer: 34 mL/min/{1.73_m2} — ABNORMAL LOW (ref >59)
Glucose: 130 mg/dL — ABNORMAL HIGH (ref 65–99)
Potassium: 4.4 mmol/L (ref 3.5–5.2)
Sodium: 137 mmol/L (ref 134–144)

## 2020-07-22 NOTE — Progress Notes (Signed)
Contacted via MyChart  Good afternoon Znya, your labs have returned.  Kidney function has declined a little on this check, we will recheck next visit and look at parathyroid labs too.  If continues to be at this level or shows a little more decline may consider sending to kidney doctor.  I recommend increasing water intake at home and avoiding anything with Ibuprofen.  Any questions?   Keep being awesome!!  Thank you for allowing me to participate in your care. Kindest regards, Britainy Kozub

## 2020-07-27 ENCOUNTER — Ambulatory Visit: Payer: Medicare Other | Admitting: Nurse Practitioner

## 2020-08-14 ENCOUNTER — Ambulatory Visit (INDEPENDENT_AMBULATORY_CARE_PROVIDER_SITE_OTHER): Payer: Medicare Other

## 2020-08-14 VITALS — Ht 64.0 in | Wt 198.0 lb

## 2020-08-14 DIAGNOSIS — Z0000 Encounter for general adult medical examination without abnormal findings: Secondary | ICD-10-CM

## 2020-08-14 NOTE — Progress Notes (Signed)
I connected with Kim Thomas today by telephone and verified that I am speaking with the correct person using two identifiers. Location patient: home Location provider: work Persons participating in the virtual visit: Milana Salay, Glenna Durand LPN.   I discussed the limitations, risks, security and privacy concerns of performing an evaluation and management service by telephone and the availability of in person appointments. I also discussed with the patient that there may be a patient responsible charge related to this service. The patient expressed understanding and verbally consented to this telephonic visit.    Interactive audio and video telecommunications were attempted between this provider and patient, however failed, due to patient having technical difficulties OR patient did not have access to video capability.  We continued and completed visit with audio only.     Vital signs may be patient reported or missing.  Subjective:   Kim Thomas is a 64 y.o. female who presents for Medicare Annual (Subsequent) preventive examination.  Review of Systems     Cardiac Risk Factors include: diabetes mellitus;dyslipidemia;hypertension;obesity (BMI >30kg/m2);sedentary lifestyle     Objective:    Today's Vitals   08/14/20 1027 08/14/20 1028  Weight: 198 lb (89.8 kg)   Height: 5' 4" (1.626 m)   PainSc:  8    Body mass index is 33.99 kg/m.  Advanced Directives 08/14/2020 08/14/2019 01/03/2019 12/13/2018 09/13/2017 05/24/2017 02/20/2017  Does Patient Have a Medical Advance Directive? _0  No No  Type of Paramedic of [place];Living will Living will;Healthcare Power of [place];Living will [place];Living will [place];Living will - -  Does patient want to make changes to medical advance directive? - - - No - Patient declined - - -  Copy of [place] in Chart? No -  copy requested No - copy requested - No - copy requested - - -  Would patient like information on creating a medical advance directive? - - - - - - -    Current Medications (verified) Outpatient Encounter Medications as of 08/14/2020  Medication Sig  . acetaminophen (TYLENOL) 500 MG tablet Take 500 mg by mouth daily as needed.  [name] atorvastatin (LIPITOR) 20 MG tablet TAKE 1 TABLET BY MOUTH  DAILY  . BLACK CURRANT SEED OIL PO Take 1,250 mg by mouth 2 (two) times daily.  . Blood Glucose Monitoring Suppl (ONE TOUCH ULTRA MINI) w/Device KIT As directed  . CALCIUM CITRATE PO Take 1,000 mg by mouth daily.  . Cholecalciferol (VITAMIN D3) 2000 units TABS Take 2,000 Units by mouth daily.  [name] Co-Enzyme Q-10 100 MG CAPS Take 50 mg by mouth daily.  . cyclobenzaprine (FLEXERIL) 10 MG tablet Take 10 mg by mouth at bedtime. Take 1/2 tablet to 1 as needed  At bed time  . diclofenac sodium (VOLTAREN) 1 % GEL Apply topically.  [name] ALLERGY RELIEF, CETIRIZINE, 10 MG tablet Take 1 tablet by mouth once daily  . fluticasone (FLONASE) 50 MCG/ACT nasal spray USE 1 TO 2 SPRAY(S) IN EACH NOSTRIL TWICE DAILY  . gabapentin (NEURONTIN) 400 MG capsule Take 1 capsule (400 mg total) by mouth 3 (three) times daily.  [name] glipiZIDE (GLUCOTROL) 5 MG tablet Take 0.5 tablets (2.5 mg total) by mouth daily before breakfast.  . JARDIANCE 25 MG TABS tablet TAKE 1 TABLET BY MOUTH  DAILY  . Lancets (ONETOUCH DELICA PLUS [id number]) MISC 1 each by Does not apply route 2 (two) times daily.  [name]  Multiple Vitamin (MULTIVITAMIN) tablet Take 1 tablet by mouth daily.  . Omega-3 1000 MG CAPS Take 1 capsule by mouth daily.  [name] omeprazole (PRILOSEC) 20 MG capsule TAKE 1 CAPSULE BY MOUTH  DAILY  . ONETOUCH ULTRA test strip USE TO CHECK BLOOD SUGAR  DAILY  . Potassium 99 MG TABS Take by mouth.  . telmisartan (MICARDIS) 40 MG tablet Take 1 tablet (40 mg total) by mouth daily.  . vitamin E 100 UNIT capsule Take 100 Units by mouth daily.  [name] triamcinolone  cream (KENALOG) 0.1 % Apply 1 application topically 2 (two) times daily.   No facility-administered encounter medications on file as of 08/14/2020.    Allergies (verified) Morphine and related, Erythromycin, and Hydrocodone-acetaminophen   History: Past Medical History:  Diagnosis Date  . Anxiety   . Chronic kidney disease   . DDD (degenerative disc disease), cervical   . Diabetes mellitus without complication ([place])   . Gout   . Hypertension   . Sleep apnea    Past Surgical History:  Procedure Laterality Date  . BREAST BIOPSY Left    benign  . BREAST CYST EXCISION Left   . BREAST EXCISIONAL BIOPSY Left    benign  . c section     x2  . CHOLECYSTECTOMY    . EYE SURGERY Bilateral 2019   catracts Dr.Hecter in [place]   . FOOT SURGERY Bilateral   . KNEE ARTHROSCOPY Left 07/18/2016   Procedure: ARTHROSCOPY KNEE;  Surgeon: Dereck Leep, MD;  Location: [facility] ORS;  Service: Orthopedics;  Laterality: Left;  . KNEE ARTHROSCOPY WITH LATERAL MENISECTOMY Left 07/18/2016   Procedure: KNEE ARTHROSCOPY WITH PARTIAL  LATERAL MENISECTOMY, CHONDROPLASTY LATERAL FEMORAL CONDYLE;  Surgeon: Dereck Leep, MD;  Location: [facility] ORS;  Service: Orthopedics;  Laterality: Left;  . SPINE SURGERY  11/09/11   C5  . TUBAL LIGATION     Family History  Problem Relation Age of Onset  . Cancer Mother        breast  . Hypertension Mother   . Hyperlipidemia Mother   . Diabetes Mother   . Glaucoma Mother   . Cataracts Mother   . Breast cancer Mother 55  . Cancer Father   . Cancer Maternal Grandfather   . Breast cancer Maternal Aunt   . Breast cancer Cousin   . Alcohol abuse Neg Hx    Social History   Socioeconomic History  . Marital status: Single    Spouse name: Not on file  . Number of children: Not on file  . Years of education: Not on file  . Highest education level: Bachelor's degree (e.g., BA, AB, BS)  Occupational History  . Occupation: retired  Tobacco Use  . Smoking status:  Former Smoker    Packs/day: 0.25    Quit date: 05/21/2011    Years since quitting: 9.2  . Smokeless tobacco: Never Used  Vaping Use  . Vaping Use: Never used  Substance and Sexual Activity  . Alcohol use: No    Alcohol/week: 0.0 standard drinks  . Drug use: No  . Sexual activity: Not Currently  Other Topics Concern  . Not on file  Social History Narrative  . Not on file   Social Determinants of Health   Financial Resource Strain: Low Risk   . Difficulty of Paying Living Expenses: Not very hard  Food Insecurity: No Food Insecurity  . Worried About Charity fundraiser in the Last Year: Never true  . Ran Out of Food  in the Last Year: Never true  Transportation Needs: No Transportation Needs  . Lack of Transportation (Medical): No  . Lack of Transportation (Non-Medical): No  Physical Activity: Inactive  . Days of Exercise per Week: 0 days  . Minutes of Exercise per Session: 0 min  Stress: No Stress Concern Present  . Feeling of Stress : Not at all  Social Connections: Unknown  . Frequency of Communication with Friends and Family: More than three times a week  . Frequency of Social Gatherings with Friends and Family: More than three times a week  . Attends Religious Services: More than 4 times per year  . Active Member of Clubs or Organizations: No  . Attends Archivist Meetings: Never  . Marital Status: Not on file    Tobacco Counseling Counseling given: Not Answered   Clinical Intake:  Pre-visit preparation completed: Yes  Pain : 0-10 Pain Score: 8  Pain Type: Acute pain Pain Location: Foot Pain Orientation: Left Pain Descriptors / Indicators: Throbbing, Stabbing Pain Onset: 1 to 4 weeks ago Pain Frequency: Intermittent     Nutritional Status: BMI > 30  Obese Nutritional Risks: None Diabetes: Yes  How often do you need to have someone help you when you read instructions, pamphlets, or other written materials from your doctor or pharmacy?: 1 -  Never What is the last grade level you completed in school?: BS  Diabetic? Yes Nutrition Risk Assessment:  Has the patient had any N/V/D within the last 2 months?  No  Does the patient have any non-healing wounds?  No  Has the patient had any unintentional weight loss or weight gain?  No   Diabetes:  Is the patient diabetic?  Yes  If diabetic, was a CBG obtained today?  No  Did the patient bring in their glucometer from home?  No  How often do you monitor your CBG's? Twice a week.   Financial Strains and Diabetes Management:  Are you having any financial strains with the device, your supplies or your medication? No .  Does the patient want to be seen by Chronic Care Management for management of their diabetes?  No  Would the patient like to be referred to a Nutritionist or for Diabetic Management?  No   Diabetic Exams:  Diabetic Eye Exam: Overdue for diabetic eye exam. Pt has been advised about the importance in completing this exam. Patient advised to call and schedule an eye exam. Diabetic Foot Exam: Completed 03/04/2020   Interpreter Needed?: No  Information entered by :: NAllen LPN   Activities of Daily Living In your present state of health, do you have any difficulty performing the following activities: 08/14/2020 03/04/2020  Hearing? N N  Vision? N N  Difficulty concentrating or making decisions? N N  Walking or climbing stairs? Y Y  Dressing or bathing? N N  Doing errands, shopping? N N  Preparing Food and eating ? N -  Using the Toilet? N -  In the past six months, have you accidently leaked urine? N -  Do you have problems with loss of bowel control? N -  Managing your Medications? N -  Managing your Finances? N -  Housekeeping or managing your Housekeeping? N -  Some recent data might be hidden    Patient Care Team: Venita Lick, NP as PCP - General (Nurse Practitioner) Anabel Bene, MD as Referring Physician (Neurology) Watt Climes, PA as  Physician Assistant (Orthopedic Surgery) Dereck Leep, MD (  Orthopedic Surgery) Vanita Ingles, RN as Registered Nurse (General Practice)  Indicate any recent Medical Services you may have received from other than [facility] providers in the past year (date may be approximate).     Assessment:   This is a routine wellness examination for [facility].  Hearing/Vision screen  Hearing Screening   125Hz 250Hz 500Hz 1000Hz 2000Hz 3000Hz 4000Hz 6000Hz 8000Hz  Right ear:           Left ear:           Vision Screening Comments: Regular eye exams, [facility]  Dietary issues and exercise activities discussed: Current Exercise Habits: The patient does not participate in regular exercise at present  Goals    .  DIET - INCREASE WATER INTAKE      Recommend drinking at least 6-8 glasses of water a day     .  Increase water intake      Recommend drinking at least 4-5 glasses a day    .  Patient Stated      08/14/2020, lose 15-20 pounds    .  PharmD "I want to stay healthy" (pt-stated)      CARE PLAN ENTRY (see longtitudinal plan of care for additional care plan information)  Current Barriers:  . Polypharmacy; complex patient with multiple comorbidities including T2DM, CKD, peripheral neuropathy, allergies o Reports that she plans on going out to [facility] [place] to visit her daughter and son and their families in the next few months. Waiting on diabetic shoe inserts.  . Self-manages medications o T2DM: Most recent A1c 7.6%, currently taking Jardiance 25 mg daily; glipizide 2.5 mg QAM - Glucose readings: fastings and early afternoons ~ 100-110s - Exercise: using exercise bike, stretch bands, and range of motion exercises - Meal patterns:  . Has increased more leafy greens . Decreased sweets intake; limits portion sizes when she does eat some sweets - Renal function limits metformin; hx Ozempic caused GI upset o HTN: telmisartan 40 mg daily - Not checking BP yet o ASCVD risk reduction:  atorvastatin 20 mg daily; last LDL at goal <70 o Peripheral neuropathy: gabapentin 400 mg TID; notes that she occasionally skips midday dose because it makes her sleepy o Osteopenia: T score femoral spine -2.2; FRAX risk of major osteoporotic fracture 11%, hip fracture 1.7% - Vitamin D3 2000 units daily o Allergies: cetirizine 10 mg daily PRN, flonase nasal PRN o Supplements: black seed oil, Vitamin E, CoQ10, omega 3 fatty acids    Pharmacist Clinical Goal(s):  [name] Over the next 90 days, patient will work with PharmD and provider towards optimized medication management  Interventions: . Comprehensive medication review performed, medication list updated in electronic medical record . Inter-disciplinary care team collaboration (see longitudinal plan of care) . Encouraged to start checking occasional 2 hour post prandial sugar readings. Providing with blood sugar and blood pressure log to document readings.  . Discussed glipizide's impact on prandial sugar readings. Discussed that breakfast is a smaller meal for her. May consider changing glipizide to supper based upon readings, if still needed.  . Patient notes she is planning on going out to shop tomorrow, so will pick up a blood pressure machine. Encouraged to start documenting BP readings as well.  Patient Self Care Activities:  . Patient will take medications as prescribed  Please see past updates related to this goal by clicking on the "Past Updates" button in the selected goal      .  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 Lacks caregiver support- Patient moved here from [place]- sister lives in [place] and [place] still live out [place]. Manages her own healthcare. . Chronic Disease Management support and education needs related to HTN, DMII, CKD, and neuropathy.   Nurse Case Manager Clinical Goal(s):  [name] Over the next 90 days, patient will work with [facility] to address needs related to  goals of returning to exercise for weight loss.   Interventions:  . Evaluation of current treatment plan related to DMII and patient's adherence to plan as established by provider. . Discussed plans with patient for ongoing care management follow up and provided patient with direct contact information for care management team . Evaluation of working with chiropractor-Patient stated she has been to the chiropractor and it was very helpful. The patient states she is down to one visit a month and feels this will stop soon as she is doing well . Evaluation of medical boots to assist with ambulation. Patient confirms she has the boots but has not been able to get out and use them a lot due to the bad weather. She is hopeful soon she can get out and do more activity. Education on 15 to 20 minutes exercise at least 3 or 4 times a week can be beneficial in reducing blood sugars and help her Thomas health and well being.  . Education and support on the benefits of good dietary choices and exercising in helping control her DM and other chronic health conditions.  . Review and evaluation of upcoming appointments with provider: See pcp on 03-04-2020 at 0800am   Patient Self Care Activities:  . Self administers medications as prescribed . Attends all scheduled provider appointments . Performs ADL's independently . Performs IADL's independently . Patient unable to participate in exercise plan related to current pain  Please see past updates related to this goal by clicking on the "Past Updates" button in the selected goal      .  RN- Managing chronic diseases (pt-stated)      Current Barriers:  . Chronic Disease Management support, education, and care coordination needs related to HTN, HLD, DMII, and CKD Stage 3 . Does not have a blood pressure cuff-plans to get one soon  Clinical Goal(s) related to HTN, HLD, DMII, and CKD Stage 3 :  Over the next 120 days, patient will:  . Work with the care management  team to address educational, disease management, and care coordination needs  . Begin or continue self health monitoring activities as directed today Measure and record cbg (blood glucose) x1 times daily and Measure and record blood pressure 2 times per week . Call provider office for new or worsened signs and symptoms Blood glucose findings outside established parameters . Call care management team with questions or concerns . Verbalize basic understanding of patient centered plan of care established today  Interventions related to HTN, HLD, DMII, and CKD Stage 3 :  . Evaluation of current treatment plans and patient's adherence to plan as established by provider. The patient has seen the pcp recently.  Review of chronic conditions and plan of care.  . Assessed patient understanding of disease states- the patient desires to have a lower A1C- currently value is 7.6. She is being mindful of her dietary intake. She is eating better and has decided to eat sweets only on Sundays.  Education and support- will send information by my Chart and Emmi on  healthy food choices for heart healthy/ADA diet . Assessed patient's education and care coordination needs . Provided disease specific education to patient . Collaborated with appropriate clinical care team members regarding patient needs . Education on the OTC benefit through [facility] and the monitoring program through [facility] where she can get a blood pressure cuff to use to monitor her blood pressures and record on a daily basis- the patient currently does not have a blood pressure cuff, but is aware of the over the counter benefit, the housecalls benefit and has the life alert system   Patient Self Care Activities related to HTN, HLD, DMII, and CKD Stage 3 :  . Patient is unable to independently self-manage chronic health conditions  Please see past updates related to this goal by clicking on the "Past Updates" button in the selected goal        Depression  Screen PHQ 2/9 Scores 08/14/2020 03/04/2020 01/17/2020 08/14/2019 01/03/2019 05/04/2018 04/03/2018  PHQ - 2 Score 0 0 0 0 0 0 0  PHQ- 9 Score - - - - - - 1    Fall Risk Fall Risk  08/14/2020 03/04/2020 08/14/2019 01/31/2019 01/03/2019  Falls in the past year? 0 0 0 (No Data) 0  Comment - - - no falls since previous visit -  Number falls in past yr: - 0 - - -  Comment - - - - -  Injury with Fall? - 0 - - -  Risk Factor Category  - - - - -  Risk for fall due to : Medication side effect - - - -  Follow up Falls evaluation completed;Education provided;Falls prevention discussed Falls evaluation completed - - -    Any stairs in or around the home? Yes  If so, are there any without handrails? No  Home free of loose throw rugs in walkways, pet beds, electrical cords, etc? Yes  Adequate lighting in your home to reduce risk of falls? Yes   ASSISTIVE DEVICES UTILIZED TO PREVENT FALLS:  Life alert? No  Use of a cane, walker or w/c? Yes  Grab bars in the bathroom? Yes  Shower chair or bench in shower? Yes  Elevated toilet seat or a handicapped toilet? Yes   TIMED UP AND GO:  Was the test performed? No .   Cognitive Function:     6CIT Screen 08/14/2020 05/04/2018 06/02/2017  What Year? 0 points 0 points 0 points  What month? 0 points 0 points 0 points  What time? 0 points 0 points 0 points  Count back from 20 0 points 0 points 0 points  Months in reverse 0 points 0 points 0 points  Repeat phrase 0 points 0 points 0 points  Total Score 0 0 0    Immunizations Immunization History  Administered Date(s) Administered  . PFIZER SARS-COV-2 Vaccination 03/28/2020, 04/18/2020  . Td 04/22/2015  . Zoster Recombinat (Shingrix) 02/23/2017, 06/16/2017    TDAP status: Up to date Flu Vaccine status: Declined, Education has been provided regarding the importance of this vaccine but patient still declined. Advised may receive this vaccine at local pharmacy or Health Dept. Aware to provide a copy of the vaccination  record if obtained from local pharmacy or Health Dept. Verbalized acceptance and understanding. Pneumococcal vaccine status: Up to date Covid-19 vaccine status: Completed vaccines  Qualifies for Shingles Vaccine? Yes   Zostavax completed No   Shingrix Completed?: Yes  Screening Tests Health Maintenance  Topic Date Due  . OPHTHALMOLOGY EXAM  04/15/2020  .  HIV Screening  12/02/2020 (Originally 01/14/1971)  . INFLUENZA VACCINE  02/25/2021 (Originally 06/28/2020)  . HEMOGLOBIN A1C  01/21/2021  . FOOT EXAM  03/04/2021  . MAMMOGRAM  01/27/2022  . PAP SMEAR-Modifier  02/20/2022  . COLONOSCOPY  11/29/2023  . TETANUS/TDAP  04/21/2025  . COVID-19 Vaccine  Completed  . Hepatitis C Screening  Completed    Health Maintenance  Health Maintenance Due  Topic Date Due  . OPHTHALMOLOGY EXAM  04/15/2020    Colorectal cancer screening: Completed 11/28/2013. Repeat every 10 years Mammogram status: Completed 01/28/2020. Repeat every year Bone Density status: Completed 01/28/2020.   Lung Cancer Screening: (Low Dose CT Chest recommended if Age 30-80 years, 30 pack-year currently smoking OR have quit w/in 15years.) does not qualify.   Lung Cancer Screening Referral: no  Additional Screening:  Hepatitis C Screening: does qualify; Completed 06/30/2017  Vision Screening: Recommended annual ophthalmology exams for early detection of glaucoma and other disorders of the eye. Is the patient up to date with their annual eye exam?  No  Who is the provider or what is the name of the office in which the patient attends annual eye exams? [facility] If pt is not established with a provider, would they like to be referred to a provider to establish care? No .   Dental Screening: Recommended annual dental exams for proper oral hygiene  Community Resource Referral / Chronic Care Management: CRR required this visit?  No   CCM required this visit?  No      Plan:     I have personally reviewed and noted  the following in the patient's chart:   . Medical and social history . Use of alcohol, tobacco or illicit drugs  . Current medications and supplements . Functional ability and status . Nutritional status . Physical activity . Advanced directives . List of other physicians . Hospitalizations, surgeries, and ER visits in previous 12 months . Vitals . Screenings to include cognitive, depression, and falls . Referrals and appointments  In addition, I have reviewed and discussed with patient certain preventive protocols, quality metrics, and best practice recommendations. A written personalized care plan for preventive services as well as general preventive health recommendations were provided to patient.     Kellie Simmering, LPN   2/99/2426   Nurse Notes:

## 2020-08-14 NOTE — Patient Instructions (Signed)
Kim Thomas , Thank you for taking time to come for your Medicare Wellness Visit. I appreciate your ongoing commitment to your health goals. Please review the following plan we discussed and let me know if I can assist you in the future.   Screening recommendations/referrals: Colonoscopy: completed 11/28/2013 Mammogram: completed 01/28/2020 Bone Density: completed 01/28/2020 Recommended yearly ophthalmology/optometry visit for glaucoma screening and checkup Recommended yearly dental visit for hygiene and checkup  Vaccinations: Influenza vaccine: decline Pneumococcal vaccine: n/a Tdap vaccine: completed 04/22/2015 Shingles vaccine: completed  Covid-19:  Completed 04/18/2020, 03/28/2020  Advanced directives: Please bring a copy of your POA (Power of Attorney) and/or Living Will to your next appointment.   Conditions/risks identified: none  Next appointment: Follow up in one year for your annual wellness visit.   Preventive Care 40-64 Years, Female Preventive care refers to lifestyle choices and visits with your health care provider that can promote health and wellness. What does preventive care include?  A yearly physical exam. This is also called an annual well check.  Dental exams once or twice a year.  Routine eye exams. Ask your health care provider how often you should have your eyes checked.  Personal lifestyle choices, including:  Daily care of your teeth and gums.  Regular physical activity.  Eating a healthy diet.  Avoiding tobacco and drug use.  Limiting alcohol use.  Practicing safe sex.  Taking low-dose aspirin daily starting at age 73.  Taking vitamin and mineral supplements as recommended by your health care provider. What happens during an annual well check? The services and screenings done by your health care provider during your annual well check will depend on your age, overall health, lifestyle risk factors, and family history of disease. Counseling  Your  health care provider may ask you questions about your:  Alcohol use.  Tobacco use.  Drug use.  Emotional well-being.  Home and relationship well-being.  Sexual activity.  Eating habits.  Work and work Statistician.  Method of birth control.  Menstrual cycle.  Pregnancy history. Screening  You may have the following tests or measurements:  Height, weight, and BMI.  Blood pressure.  Lipid and cholesterol levels. These may be checked every 5 years, or more frequently if you are over 47 years old.  Skin check.  Lung cancer screening. You may have this screening every year starting at age 32 if you have a 30-pack-year history of smoking and currently smoke or have quit within the past 15 years.  Fecal occult blood test (FOBT) of the stool. You may have this test every year starting at age 16.  Flexible sigmoidoscopy or colonoscopy. You may have a sigmoidoscopy every 5 years or a colonoscopy every 10 years starting at age 64.  Hepatitis C blood test.  Hepatitis B blood test.  Sexually transmitted disease (STD) testing.  Diabetes screening. This is done by checking your blood sugar (glucose) after you have not eaten for a while (fasting). You may have this done every 1-3 years.  Mammogram. This may be done every 1-2 years. Talk to your health care provider about when you should start having regular mammograms. This may depend on whether you have a family history of breast cancer.  BRCA-related cancer screening. This may be done if you have a family history of breast, ovarian, tubal, or peritoneal cancers.  Pelvic exam and Pap test. This may be done every 3 years starting at age 37. Starting at age 54, this may be done every 5 years if  you have a Pap test in combination with an HPV test.  Bone density scan. This is done to screen for osteoporosis. You may have this scan if you are at high risk for osteoporosis. Discuss your test results, treatment options, and if  necessary, the need for more tests with your health care provider. Vaccines  Your health care provider may recommend certain vaccines, such as:  Influenza vaccine. This is recommended every year.  Tetanus, diphtheria, and acellular pertussis (Tdap, Td) vaccine. You may need a Td booster every 10 years.  Zoster vaccine. You may need this after age 64.  Pneumococcal 13-valent conjugate (PCV13) vaccine. You may need this if you have certain conditions and were not previously vaccinated.  Pneumococcal polysaccharide (PPSV23) vaccine. You may need one or two doses if you smoke cigarettes or if you have certain conditions. Talk to your health care provider about which screenings and vaccines you need and how often you need them. This information is not intended to replace advice given to you by your health care provider. Make sure you discuss any questions you have with your health care provider. Document Released: 12/11/2015 Document Revised: 08/03/2016 Document Reviewed: 09/15/2015 Elsevier Interactive Patient Education  2017 [employer] Prevention in the Home Falls can cause injuries. They can happen to people of all ages. There are many things you can do to make your home safe and to help prevent falls. What can I do on the outside of my home?  Regularly fix the edges of walkways and driveways and fix any cracks.  Remove anything that might make you trip as you walk through a door, such as a raised step or threshold.  Trim any bushes or trees on the path to your home.  Use bright outdoor lighting.  Clear any walking paths of anything that might make someone trip, such as rocks or tools.  Regularly check to see if handrails are loose or broken. Make sure that both sides of any steps have handrails.  Any raised decks and porches should have guardrails on the edges.  Have any leaves, snow, or ice cleared regularly.  Use sand or salt on walking paths during  winter.  Clean up any spills in your garage right away. This includes oil or grease spills. What can I do in the bathroom?  Use night lights.  Install grab bars by the toilet and in the tub and shower. Do not use towel bars as grab bars.  Use non-skid mats or decals in the tub or shower.  If you need to sit down in the shower, use a plastic, non-slip stool.  Keep the floor dry. Clean up any water that spills on the floor as soon as it happens.  Remove soap buildup in the tub or shower regularly.  Attach bath mats securely with double-sided non-slip rug tape.  Do not have throw rugs and other things on the floor that can make you trip. What can I do in the bedroom?  Use night lights.  Make sure that you have a light by your bed that is easy to reach.  Do not use any sheets or blankets that are too big for your bed. They should not hang down onto the floor.  Have a firm chair that has side arms. You can use this for support while you get dressed.  Do not have throw rugs and other things on the floor that can make you trip. What can I do in the  kitchen?  Clean up any spills right away.  Avoid walking on wet floors.  Keep items that you use a lot in easy-to-reach places.  If you need to reach something above you, use a strong step stool that has a grab bar.  Keep electrical cords out of the way.  Do not use floor polish or wax that makes floors slippery. If you must use wax, use non-skid floor wax.  Do not have throw rugs and other things on the floor that can make you trip. What can I do with my stairs?  Do not leave any items on the stairs.  Make sure that there are handrails on both sides of the stairs and use them. Fix handrails that are broken or loose. Make sure that handrails are as long as the stairways.  Check any carpeting to make sure that it is firmly attached to the stairs. Fix any carpet that is loose or worn.  Avoid having throw rugs at the top or  bottom of the stairs. If you do have throw rugs, attach them to the floor with carpet tape.  Make sure that you have a light switch at the top of the stairs and the bottom of the stairs. If you do not have them, ask someone to add them for you. What else can I do to help prevent falls?  Wear shoes that:  Do not have high heels.  Have rubber bottoms.  Are comfortable and fit you well.  Are closed at the toe. Do not wear sandals.  If you use a stepladder:  Make sure that it is fully opened. Do not climb a closed stepladder.  Make sure that both sides of the stepladder are locked into place.  Ask someone to hold it for you, if possible.  Clearly mark and make sure that you can see:  Any grab bars or handrails.  First and last steps.  Where the edge of each step is.  Use tools that help you move around (mobility aids) if they are needed. These include:  Canes.  Walkers.  Scooters.  Crutches.  Turn on the lights when you go into a dark area. Replace any light bulbs as soon as they burn out.  Set up your furniture so you have a clear path. Avoid moving your furniture around.  If any of your floors are uneven, fix them.  If there are any pets around you, be aware of where they are.  Review your medicines with your doctor. Some medicines can make you feel dizzy. This can increase your chance of falling. Ask your doctor what other things that you can do to help prevent falls. This information is not intended to replace advice given to you by your health care provider. Make sure you discuss any questions you have with your health care provider. Document Released: 09/10/2009 Document Revised: 04/21/2016 Document Reviewed: 12/19/2014 Elsevier Interactive Patient Education  2017 [employer].

## 2020-09-01 ENCOUNTER — Ambulatory Visit (INDEPENDENT_AMBULATORY_CARE_PROVIDER_SITE_OTHER): Payer: Medicare Other | Admitting: General Practice

## 2020-09-01 ENCOUNTER — Telehealth: Payer: Medicare Other | Admitting: General Practice

## 2020-09-01 DIAGNOSIS — N1831 Chronic kidney disease, stage 3a: Secondary | ICD-10-CM

## 2020-09-01 DIAGNOSIS — E1159 Type 2 diabetes mellitus with other circulatory complications: Secondary | ICD-10-CM | POA: Diagnosis not present

## 2020-09-01 DIAGNOSIS — E1121 Type 2 diabetes mellitus with diabetic nephropathy: Secondary | ICD-10-CM | POA: Diagnosis not present

## 2020-09-01 DIAGNOSIS — E1169 Type 2 diabetes mellitus with other specified complication: Secondary | ICD-10-CM | POA: Diagnosis not present

## 2020-09-01 DIAGNOSIS — E785 Hyperlipidemia, unspecified: Secondary | ICD-10-CM | POA: Diagnosis not present

## 2020-09-01 DIAGNOSIS — I152 Hypertension secondary to endocrine disorders: Secondary | ICD-10-CM

## 2020-09-01 NOTE — Patient Instructions (Signed)
Visit Information  Goals Addressed              This Visit's Progress     COMPLETED: RN- I want to work towards being able to exercise (pt-stated)        Current Barriers:   Lacks caregiver support- Patient moved here from [place]- sister lives in [place] and [place]. Manages her own healthcare.  Chronic Disease Management support and education needs related to HTN, DMII, CKD, and neuropathy.   Nurse Case Manager Clinical Goal(s):   Over the next 90 days, patient will work with [facility] to address needs related to goals of returning to exercise for weight loss.   Interventions:   Evaluation of current treatment plan related to DMII and patient's adherence to plan as established by provider.  Discussed plans with patient for ongoing care management follow up and provided patient with direct contact information for care management team  Evaluation of working with chiropractor-Patient stated she has been to the chiropractor and it was very helpful. The patient states she is down to one visit a month and feels this will stop soon as she is doing well  Evaluation of medical boots to assist with ambulation. Patient confirms she has the boots but has not been able to get out and use them a lot due to the bad weather. She is hopeful soon she can get out and do more activity. Education on 15 to 20 minutes exercise at least 3 or 4 times a week can be beneficial in reducing blood sugars and help her overall health and well being.   Education and support on the benefits of good dietary choices and exercising in helping control her DM and other chronic health conditions.   Review and evaluation of upcoming appointments with provider: See pcp on 03-04-2020 at 0800am   Patient Self Care Activities:   Self administers medications as prescribed  Attends all scheduled provider appointments  Performs ADL's independently  Performs IADL's independently  Patient unable to  participate in exercise plan related to current pain  Please see past updates related to this goal by clicking on the "Past Updates" button in the selected goal        RN- Managing chronic diseases (pt-stated)        Current Barriers:   Chronic Disease Management support, education, and care coordination needs related to HTN, HLD, DMII, and CKD Stage 3  Does not have a blood pressure cuff-plans to get one soon  Clinical Goal(s) related to HTN, HLD, DMII, and CKD Stage 3 :  Over the next 120 days, patient will:   Work with the care management team to address educational, disease management, and care coordination needs   Begin or continue self health monitoring activities as directed today Measure and record cbg (blood glucose) x1 times daily and Measure and record blood pressure 2 times per week  Call provider office for new or worsened signs and symptoms Blood glucose findings outside established parameters  Call care management team with questions or concerns  Verbalize basic understanding of patient centered plan of care established today  Interventions related to HTN, HLD, DMII, and CKD Stage 3 :   Evaluation of current treatment plans and patient's adherence to plan as established by provider. The patient has seen the pcp recently.  Review of chronic conditions and plan of care.   Assessed patient understanding of disease states- 09-01-2020: The patient saw the pcp in August and her hemoglobin A1C  was 6.0.  She has also lost 10 pounds. She states she doesn't really know what she did because she did not make any changes, but she is happy and feels great.   Assessed patient's education and care coordination needs.  09-01-2020: The patient denies any needs.  Did review dietary restrictions.  The patient states she is watching what she eats. She does go to the podiatrist on Thursday 09-03-2020.  The patient stumped her toe before leaving to go to [place] and it is still bother her. She  is hopeful that the podiatrist will be able to give her answers.   Provided disease specific education to patient.  09-01-2020: The patient is having some "nerve sensations" and noticing it more since she had the injury to her toe.  She is going to discuss this with the podiatrist and ask the podiatrist his thoughts. Educated on writing questions down to ask the provider at the visit. This is concerning her and the last time she had this was when she was having issues with her neck in 2012.  For some reason she noticed this when she stumped her toe and it is something she wants to get checked out.   Collaborated with appropriate clinical care team members regarding patient needs  Education on the OTC benefit through [facility] and the monitoring program through [facility] where she can get a blood pressure cuff to use to monitor her blood pressures and record on a daily basis- the patient currently does not have a blood pressure cuff, but is aware of the over the counter benefit, the housecalls benefit and has the life alert system.  09-01-2020: The patient has a new card number and it is [id number].  Instructed the patient to bring it in the next times she comes so it can be updated in the system.   Patient Self Care Activities related to HTN, HLD, DMII, and CKD Stage 3 :   Patient is unable to independently self-manage chronic health conditions  Please see past updates related to this goal by clicking on the "Past Updates" button in the selected goal         Patient verbalizes understanding of instructions provided today.   Telephone follow up appointment with care management team member scheduled for: 10-27-2020 at 10:30 am  Alto Denver RN, MSN, CCM Community Care Coordinator [place]   [facility] Mobile: [phone]

## 2020-09-01 NOTE — Chronic Care Management (AMB) (Signed)
Chronic Care Management   Follow Up Note   09/01/2020 Name: Kim Thomas MRN: [id number] DOB: 11/07/1956  Referred by: Venita Lick, NP Reason for referral : Chronic Care Management [facility] Out[facility] follow up for Chronic Disease Management and Care Coordination Needs)   Kim Thomas is a 64 y.o. year old female who is a primary care patient of Cannady, [name], NP. The CCM team was consulted for assistance with chronic disease management and care coordination needs.    Review of patient status, including review of consultants reports, relevant laboratory and other test results, and collaboration with appropriate care team members and the patient's provider was performed as part of comprehensive patient evaluation and provision of chronic care management services.    SDOH (Social Determinants of Health) assessments performed: Yes See Care Plan activities for detailed interventions related to [facility])     Outpatient Encounter Medications as of 09/01/2020  Medication Sig Note  . acetaminophen (TYLENOL) 500 MG tablet Take 500 mg by mouth daily as needed. 07/13/2020: Taking one tablet twice a day.  [name] atorvastatin (LIPITOR) 20 MG tablet TAKE 1 TABLET BY MOUTH  DAILY   . BLACK CURRANT SEED OIL PO Take 1,250 mg by mouth 2 (two) times daily.   . Blood Glucose Monitoring Suppl (ONE TOUCH ULTRA MINI) w/Device KIT As directed   . CALCIUM CITRATE PO Take 1,000 mg by mouth daily.   . Cholecalciferol (VITAMIN D3) 2000 units TABS Take 2,000 Units by mouth daily.   [name] Co-Enzyme Q-10 100 MG CAPS Take 50 mg by mouth daily.   . cyclobenzaprine (FLEXERIL) 10 MG tablet Take 10 mg by mouth at bedtime. Take 1/2 tablet to 1 as needed  At bed time   . diclofenac sodium (VOLTAREN) 1 % GEL Apply topically.   [name] ALLERGY RELIEF, CETIRIZINE, 10 MG tablet Take 1 tablet by mouth once daily   . fluticasone (FLONASE) 50 MCG/ACT nasal spray USE 1 TO 2 SPRAY(S) IN EACH NOSTRIL TWICE DAILY   . gabapentin (NEURONTIN) 400  MG capsule Take 1 capsule (400 mg total) by mouth 3 (three) times daily.   [name] glipiZIDE (GLUCOTROL) 5 MG tablet Take 0.5 tablets (2.5 mg total) by mouth daily before breakfast.   . JARDIANCE 25 MG TABS tablet TAKE 1 TABLET BY MOUTH  DAILY   . Lancets (ONETOUCH DELICA PLUS [id number]) MISC 1 each by Does not apply route 2 (two) times daily.   . Multiple Vitamin (MULTIVITAMIN) tablet Take 1 tablet by mouth daily.   . Omega-3 1000 MG CAPS Take 1 capsule by mouth daily.   [name] omeprazole (PRILOSEC) 20 MG capsule TAKE 1 CAPSULE BY MOUTH  DAILY   . ONETOUCH ULTRA test strip USE TO CHECK BLOOD SUGAR  DAILY   . Potassium 99 MG TABS Take by mouth.   . telmisartan (MICARDIS) 40 MG tablet Take 1 tablet (40 mg total) by mouth daily.   [name] triamcinolone cream (KENALOG) 0.1 % Apply 1 application topically 2 (two) times daily.   . vitamin E 100 UNIT capsule Take 100 Units by mouth daily.    No facility-administered encounter medications on file as of 09/01/2020.     Objective:   BP Readings from Last 3 Encounters:  07/21/20 112/71  07/13/20 116/75  03/23/20 122/71   Goals Addressed              This Visit's Progress   .  COMPLETED: RN- I want to work towards being able to exercise (pt-stated)  Current Barriers:  . Lacks caregiver support- Patient moved here from [place]- sister lives in [place] and [place] still live out [place]. Manages her own healthcare. . Chronic Disease Management support and education needs related to HTN, DMII, CKD, and neuropathy.   Nurse Case Manager Clinical Goal(s):  [name] Over the next 90 days, patient will work with [facility] to address needs related to goals of returning to exercise for weight loss.   Interventions:  . Evaluation of current treatment plan related to DMII and patient's adherence to plan as established by provider. . Discussed plans with patient for ongoing care management follow up and provided patient with direct contact information for care management  team . Evaluation of working with chiropractor-Patient stated she has been to the chiropractor and it was very helpful. The patient states she is down to one visit a month and feels this will stop soon as she is doing well . Evaluation of medical boots to assist with ambulation. Patient confirms she has the boots but has not been able to get out and use them a lot due to the bad weather. She is hopeful soon she can get out and do more activity. Education on 15 to 20 minutes exercise at least 3 or 4 times a week can be beneficial in reducing blood sugars and help her overall health and well being.  . Education and support on the benefits of good dietary choices and exercising in helping control her DM and other chronic health conditions.  . Review and evaluation of upcoming appointments with provider: See pcp on 03-04-2020 at 0800am   Patient Self Care Activities:  . Self administers medications as prescribed . Attends all scheduled provider appointments . Performs ADL's independently . Performs IADL's independently . Patient unable to participate in exercise plan related to current pain  Please see past updates related to this goal by clicking on the "Past Updates" button in the selected goal      .  RN- Managing chronic diseases (pt-stated)        Current Barriers:  . Chronic Disease Management support, education, and care coordination needs related to HTN, HLD, DMII, and CKD Stage 3 . Does not have a blood pressure cuff-plans to get one soon  Clinical Goal(s) related to HTN, HLD, DMII, and CKD Stage 3 :  Over the next 120 days, patient will:  . Work with the care management team to address educational, disease management, and care coordination needs  . Begin or continue self health monitoring activities as directed today Measure and record cbg (blood glucose) x1 times daily and Measure and record blood pressure 2 times per week . Call provider office for new or worsened signs and symptoms  Blood glucose findings outside established parameters . Call care management team with questions or concerns . Verbalize basic understanding of patient centered plan of care established today  Interventions related to HTN, HLD, DMII, and CKD Stage 3 :  . Evaluation of current treatment plans and patient's adherence to plan as established by provider. The patient has seen the pcp recently.  Review of chronic conditions and plan of care.  . Assessed patient understanding of disease states- 09-01-2020: The patient saw the pcp in August and her hemoglobin A1C was 6.0.  She has also lost 10 pounds. She states she doesn't really know what she did because she did not make any changes, but she is happy and feels great.  . Assessed patient's education and care coordination needs.  09-01-2020:  The patient denies any needs.  Did review dietary restrictions.  The patient states she is watching what she eats. She does go to the podiatrist on Thursday 09-03-2020.  The patient stumped her toe before leaving to go to [place] and it is still bother her. She is hopeful that the podiatrist will be able to give her answers.  . Provided disease specific education to patient.  09-01-2020: The patient is having some "nerve sensations" and noticing it more since she had the injury to her toe.  She is going to discuss this with the podiatrist and ask the podiatrist his thoughts. Educated on writing questions down to ask the provider at the visit. This is concerning her and the last time she had this was when she was having issues with her neck in 2012.  For some reason she noticed this when she stumped her toe and it is something she wants to get checked out.  Nash Dimmer with appropriate clinical care team members regarding patient needs . Education on the OTC benefit through [facility] and the monitoring program through [facility] where she can get a blood pressure cuff to use to monitor her blood pressures and record on a daily basis- the  patient currently does not have a blood pressure cuff, but is aware of the over the counter benefit, the housecalls benefit and has the life alert system.  09-01-2020: The patient has a new card number and it is [id number].  Instructed the patient to bring it in the next times she comes so it can be updated in the system.   Patient Self Care Activities related to HTN, HLD, DMII, and CKD Stage 3 :  . Patient is unable to independently self-manage chronic health conditions  Please see past updates related to this goal by clicking on the "Past Updates" button in the selected goal          Plan:   Telephone follow up appointment with care management team member scheduled for: 10-27-2020 at 22 am   [place], MSN, [facility] Mobile: [phone]

## 2020-09-03 ENCOUNTER — Encounter: Payer: Self-pay | Admitting: Podiatry

## 2020-09-03 ENCOUNTER — Ambulatory Visit (INDEPENDENT_AMBULATORY_CARE_PROVIDER_SITE_OTHER): Payer: Medicare Other

## 2020-09-03 ENCOUNTER — Ambulatory Visit (INDEPENDENT_AMBULATORY_CARE_PROVIDER_SITE_OTHER): Payer: Medicare Other | Admitting: Podiatry

## 2020-09-03 ENCOUNTER — Other Ambulatory Visit: Payer: Self-pay

## 2020-09-03 ENCOUNTER — Other Ambulatory Visit: Payer: Self-pay | Admitting: Podiatry

## 2020-09-03 ENCOUNTER — Other Ambulatory Visit: Payer: Self-pay | Admitting: Nurse Practitioner

## 2020-09-03 DIAGNOSIS — S92515A Nondisplaced fracture of proximal phalanx of left lesser toe(s), initial encounter for closed fracture: Secondary | ICD-10-CM

## 2020-09-03 DIAGNOSIS — S99922A Unspecified injury of left foot, initial encounter: Secondary | ICD-10-CM

## 2020-09-03 NOTE — Progress Notes (Signed)
Subjective:  Patient ID: Kim Thomas Overall, female    DOB: 27-Feb-1956,  MRN: [id number]  Chief Complaint  Patient presents with   Toe Injury    Patient presents today for pain in left toes.  She says she stumped her toes about 3 weeks ago, she had bruising, swelling, and alot of pain.  Now she says the swelling and pain are down, but still feels numb and like a band is around her toes    64 y.o. female presents with the above complaint.  Patient presents with a complaint of second third and fourth digit pain.  Patient states that she fell and stumbled/stumped her toe about 3 weeks ago and the pain has gotten better but is still painful and does not feel right.  She just wanted to get it evaluated to make sure that there is nothing else going on.  She states the swelling and pain is down now but there are still some numbness associated with it.  She has not seen anyone else prior to see me.  She is diabetic with last A1c of 6.0.  She denies any other acute complaints.   Review of Systems: Negative except as noted in the HPI. Denies N/V/F/Ch.  Past Medical History:  Diagnosis Date   Anxiety    Chronic kidney disease    DDD (degenerative disc disease), cervical    Diabetes mellitus without complication ([place])    Gout    Hypertension    Sleep apnea     Current Outpatient Medications:    acetaminophen (TYLENOL) 500 MG tablet, Take 500 mg by mouth daily as needed., Disp: , Rfl:    atorvastatin (LIPITOR) 20 MG tablet, TAKE 1 TABLET BY MOUTH  DAILY, Disp: 90 tablet, Rfl: 4   BLACK CURRANT SEED OIL PO, Take 1,250 mg by mouth 2 (two) times daily., Disp: , Rfl:    Blood Glucose Monitoring Suppl (ONE TOUCH ULTRA MINI) w/Device KIT, As directed, Disp: 1 kit, Rfl: 2   CALCIUM CITRATE PO, Take 1,000 mg by mouth daily., Disp: , Rfl:    Cholecalciferol (VITAMIN D3) 2000 units TABS, Take 2,000 Units by mouth daily., Disp: , Rfl:    Co-Enzyme Q-10 100 MG CAPS, Take 50 mg by mouth daily., Disp:  , Rfl:    cyclobenzaprine (FLEXERIL) 10 MG tablet, Take 10 mg by mouth at bedtime. Take 1/2 tablet to 1 as needed  At bed time, Disp: , Rfl:    diclofenac sodium (VOLTAREN) 1 % GEL, Apply topically., Disp: , Rfl:    EQ ALLERGY RELIEF, CETIRIZINE, 10 MG tablet, Take 1 tablet by mouth once daily, Disp: 90 tablet, Rfl: 0   fluticasone (FLONASE) 50 MCG/ACT nasal spray, USE 1 TO 2 SPRAY(S) IN EACH NOSTRIL TWICE DAILY, Disp: 16 g, Rfl: 6   gabapentin (NEURONTIN) 400 MG capsule, TAKE 1 CAPSULE BY MOUTH 3  TIMES DAILY, Disp: 270 capsule, Rfl: 3   glipiZIDE (GLUCOTROL) 5 MG tablet, Take 0.5 tablets (2.5 mg total) by mouth daily before breakfast., Disp: 30 tablet, Rfl: 11   JARDIANCE 25 MG TABS tablet, TAKE 1 TABLET BY MOUTH  DAILY, Disp: 90 tablet, Rfl: 3   Lancets (ONETOUCH DELICA PLUS [id number]) MISC, 1 each by Does not apply route 2 (two) times daily., Disp: 100 each, Rfl: 12   Multiple Vitamin (MULTIVITAMIN) tablet, Take 1 tablet by mouth daily., Disp: , Rfl:    Omega-3 1000 MG CAPS, Take 1 capsule by mouth daily., Disp: , Rfl:  20 MG capsule, TAKE 1 CAPSULE BY MOUTH  DAILY, Disp: 90 capsule, Rfl: 3 °•  ONETOUCH ULTRA test strip, USE TO CHECK BLOOD SUGAR  DAILY, Disp: 100 strip, Rfl: 3 °•  Potassium 99 MG TABS, Take by mouth., Disp: , Rfl:  °•  telmisartan (MICARDIS) 40 MG tablet, Take 1 tablet (40 mg total) by mouth daily., Disp: 90 tablet, Rfl: 3 °•  triamcinolone cream (KENALOG) 0.1 %, Apply 1 application topically 2 (two) times daily., Disp: 30 g, Rfl: 2 °•  vitamin E 100 UNIT capsule, Take 100 Units by mouth daily., Disp: , Rfl:  ° °Social History  ° °Tobacco Use  °Smoking Status Former Smoker  °• Packs/day: 0.25  °• Quit date: 05/21/2011  °• Years since quitting: 9.2  °Smokeless Tobacco Never Used  ° ° °Allergies  °Allergen Reactions  °• Morphine And Related Itching  °  It burned, and made patient itch and go out of her mind.  °• Erythromycin   °  "Been so long ago,"  patient unable to remember reaction  °• Hydrocodone-Acetaminophen Itching  ° °Objective:  °There were no vitals filed for this visit. °There is no height or weight on file to calculate BMI. °Constitutional Well developed. °Well nourished.  °Vascular Dorsalis pedis pulses palpable bilaterally. °Posterior tibial pulses palpable bilaterally. °Capillary refill normal to all digits.  °No cyanosis or clubbing noted. °Pedal hair growth normal.  °Neurologic Normal speech. °Oriented to person, place, and time. °Epicritic sensation to light touch grossly present bilaterally.  °Dermatologic Nails well groomed and normal in appearance. °No open wounds. °No skin lesions.  °Orthopedic:  Pain on palpation to the left third digit mild pain on palpation left second digit and mild pain on palpation left fourth digit.  No pain with range of motion at each of the respective metatarsophalangeal joint.  No intra-articular pain noted.  Good clinical alignment and position noted.  ° °Radiographs: Left third digit proximal phalanx fracture intra-articular well position well aligned.  No fractures noted at second and fourth digit.  No other bony abnormalities noted. °Assessment:  ° °1. Closed nondisplaced fracture of proximal phalanx of lesser toe of left foot, initial encounter   ° °Plan:  °Patient was evaluated and treated and all questions answered. ° °Left third digit proximal phalanx fracture °-I explained to the patient the etiology of fracture and various treatment options were discussed.  Given that clinically her pain is improving however still that she still has some residual pain I believe she will benefit from surgical shoe immobilization for next 4 weeks to allow it to heal appropriately.  I also believe that patient will benefit from some buddy splinting to give stability to the fracture site to allow it to have a better osseous healing.  Patient states understanding she will continue to buddy splint as well as 1 to surgical  shoe. °-Surgical shoe was dispensed. ° °No follow-ups on file.  °

## 2020-09-11 ENCOUNTER — Telehealth: Payer: Self-pay

## 2020-09-11 NOTE — Telephone Encounter (Signed)
I would recommend maintaining appointment with neurology for now since already scheduled and also recommend appointment in office for evaluation.  If worsening symptoms such as SOB, CP, or loss of function then immediately go to ER.

## 2020-09-11 NOTE — Telephone Encounter (Signed)
Called and LVM with patient asking for her to please return my call. Need to know why this patient is requesting a referral to neurology.

## 2020-09-11 NOTE — Telephone Encounter (Signed)
Pt called back in she says health issues she stumbled and hit her toe she says she has problems with her left arm tingling sensation, she stays that she has experienced these feelings before when she was having issues with her nerves. Pt states that she made an appt with zachary potter at [facility] for 09/30/20 but she doesn't know if she should cancel the appt or keep it.

## 2020-09-11 NOTE — Telephone Encounter (Signed)
Patient notified. Appointment scheduled for 09/16/20 at 8:00 AM for evaluation.

## 2020-09-11 NOTE — Telephone Encounter (Signed)
Copied from CRM [id number]. Topic: General - Inquiry >> Sep 11, 2020  1:56 PM Kim Thomas wrote: Reason for CRM: pt is calling in requesting a referral for neurologist. Please reach out to pt .  Would patient need an in office appointment first?

## 2020-09-11 NOTE — Telephone Encounter (Signed)
Routing to provider to advise.  

## 2020-09-14 ENCOUNTER — Other Ambulatory Visit: Payer: Self-pay | Admitting: Nurse Practitioner

## 2020-09-16 ENCOUNTER — Encounter: Payer: Self-pay | Admitting: Nurse Practitioner

## 2020-09-16 ENCOUNTER — Ambulatory Visit (INDEPENDENT_AMBULATORY_CARE_PROVIDER_SITE_OTHER): Payer: Medicare Other | Admitting: Nurse Practitioner

## 2020-09-16 ENCOUNTER — Other Ambulatory Visit: Payer: Self-pay

## 2020-09-16 VITALS — BP 119/75 | HR 90 | Temp 98.5°F | Resp 16 | Wt 206.0 lb

## 2020-09-16 DIAGNOSIS — R200 Anesthesia of skin: Secondary | ICD-10-CM | POA: Diagnosis not present

## 2020-09-16 NOTE — Patient Instructions (Signed)
[address], [place], [place] [address] -- [facility]   Paresthesia Paresthesia is a burning or prickling feeling. This feeling can happen in any part of the body. It often happens in the hands, arms, legs, or feet. Usually, it is not painful. In most cases, the feeling goes away in a short time and is not a sign of a serious problem. If you have paresthesia that lasts a long time, you may need to be seen by your doctor. Follow these instructions at home: Alcohol use   Do not drink alcohol if: ? Your doctor tells you not to drink. ? You are pregnant, may be pregnant, or are planning to become pregnant.  If you drink alcohol: ? Limit how much you use to:  0-1 drink a day for women.  0-2 drinks a day for men. ? Be aware of how much alcohol is in your drink. In the [place] one drink equals one 12 oz bottle of beer (355 mL), one 5 oz glass of wine (148 mL), or one 1 oz glass of hard liquor (44 mL). Nutrition   Eat a healthy diet. This includes: ? Eating foods that have a lot of fiber in them, such as fresh fruits and vegetables, whole grains, and beans. ? Limiting foods that have a lot of fat and processed sugars in them, such as fried or sweet foods. General instructions  Take over-the-counter and prescription medicines only as told by your doctor.  Do not use any products that have nicotine or tobacco in them, such as cigarettes and e-cigarettes. If you need help quitting, ask your doctor.  If you have diabetes, work with your doctor to make sure your blood sugar stays in a healthy range.  If your feet feel numb: ? Check for redness, warmth, and swelling every day. ? Wear padded socks and comfortable shoes. These help protect your feet.  Keep all follow-up visits as told by your doctor. This is important. Contact a doctor if:  You have paresthesia that gets worse or does not go away.  Your burning or prickling feeling gets worse when you walk.  You have pain or  cramps.  You feel dizzy.  You have a rash. Get help right away if you:  Feel weak.  Have trouble walking or moving.  Have problems speaking, understanding, or seeing.  Feel confused.  Cannot control when you pee (urinate) or poop (have a bowel movement).  Lose feeling (have numbness) after an injury.  Have new weakness in an arm or leg.  Pass out (faint). Summary  Paresthesia is a burning or prickling feeling. It often happens in the hands, arms, legs, or feet.  In most cases, the feeling goes away in a short time and is not a sign of a serious problem.  If you have paresthesia that lasts a long time, you may need to be seen by your doctor. This information is not intended to replace advice given to you by your health care provider. Make sure you discuss any questions you have with your health care provider. Document Revised: 12/10/2018 Document Reviewed: 11/23/2017 Elsevier Patient Education  2020 [employer].

## 2020-09-16 NOTE — Assessment & Plan Note (Signed)
Acute x 3 weeks.  Suspect coming from cervical spine with known degenerative disc, will obtain new imaging of cervical spine -- has not had in some time.  No red flag symptoms.  Continue Gabapentin at home, currently renal dosed.  CrCl 52.  Referral to PT to assist on strengthening and relieve numbness.  Labs today to include uric acid, CRP, ESR, BMP, B12, and TSH.  Return as scheduled in November for follow-up, sooner if worsening.

## 2020-09-16 NOTE — Progress Notes (Signed)
BP 119/75 (BP Location: Left Arm, Patient Position: Sitting, Cuff Size: Normal)   Pulse 90   Temp 98.5 F (36.9 C) (Oral)   Resp 16   Wt 206 lb (93.4 kg)   LMP  (LMP Unknown)   SpO2 99%   BMI 35.36 kg/m    Subjective:    Patient ID: Kim Thomas, female    DOB: [birth year], 64 y.o.   MRN: [id number]  HPI: Kim Thomas is a 64 y.o. female  Chief Complaint  Patient presents with  . Numbness    on left arm x 3 weeks   ARM NUMBNESS (LEFT) Has been present for at least 3 weeks.  Tingling and numbness in left arm.  IT starts at the top and runs down the front of the arm.  Denies any weakness in arm.  Right hand dominant.  Denies headaches, vision changes, loss of function, dizziness, CP, SOB.  Was hanging curtains prior to numbness, then the discomfort started.  Has history of gout and has been eating lots of fish lately.  Has had this issue before in 2012 -- had surgery on neck (cervical fusion C6) -- previously saw Dr. Jefm Bryant for this in 2016, only at that time it was to right side. Has known degenerative disc disease in cervical spine on past MRI.  In 2016 also saw Dr. Melrose Nakayama with neurology for numbness and tingling, was placed on Gabapentin.  She reports at that time was on too many medications from specialists and does not want to go back to that.   Duration: weeks Location: left Mechanism of injury: unknown Onset: sudden Severity: moderate  Quality:  numbness Frequency: intermittent Radiation: no Aggravating factors: movement of arm to different positions Alleviating factors: nothing Status: stable Treatments attempted: none  Relief with NSAIDs?:  No NSAIDs Taken Swelling: no Redness: no  Warmth: no Trauma: no Chest pain: no  Shortness of breath: no  Fever: no Decreased sensation: no Paresthesias: no Weakness: no   CRCL 52 -- current Gabapentin 400 MG TID at this time  Relevant past medical, surgical, family and social history reviewed and updated as indicated.  Interim medical history since our last visit reviewed. Allergies and medications reviewed and updated.  Review of Systems  Constitutional: Negative for activity change, appetite change, diaphoresis, fatigue and fever.  Respiratory: Negative for cough, chest tightness, shortness of breath and wheezing.   Cardiovascular: Negative for chest pain, palpitations and leg swelling.  Endocrine: Negative for cold intolerance and heat intolerance.  Neurological: Positive for numbness (left arm). Negative for dizziness, syncope, weakness, light-headedness and headaches.  Psychiatric/Behavioral: Negative.     Per HPI unless specifically indicated above     Objective:    BP 119/75 (BP Location: Left Arm, Patient Position: Sitting, Cuff Size: Normal)   Pulse 90   Temp 98.5 F (36.9 C) (Oral)   Resp 16   Wt 206 lb (93.4 kg)   LMP  (LMP Unknown)   SpO2 99%   BMI 35.36 kg/m   Wt Readings from Last 3 Encounters:  09/16/20 206 lb (93.4 kg)  08/14/20 198 lb (89.8 kg)  07/21/20 203 lb 3.2 oz (92.2 kg)    Physical Exam Vitals and nursing note reviewed.  Constitutional:      General: She is awake. She is not in acute distress.    Appearance: She is well-developed. She is obese. She is not ill-appearing.  HENT:     Head: Normocephalic.     Right Ear: Hearing normal.  Left Ear: Hearing normal.  Eyes:     General: Lids are normal.        Right eye: No discharge.        Left eye: No discharge.     Conjunctiva/sclera: Conjunctivae normal.     Pupils: Pupils are equal, round, and reactive to light.  Neck:     Vascular: No carotid bruit.  Cardiovascular:     Rate and Rhythm: Normal rate and regular rhythm.     Heart sounds: Normal heart sounds. No murmur heard.  No gallop.   Pulmonary:     Effort: Pulmonary effort is normal. No accessory muscle usage or respiratory distress.     Breath sounds: Normal breath sounds.  Abdominal:     General: Bowel sounds are normal.     Palpations:  Abdomen is soft.  Musculoskeletal:     Right upper arm: Normal.     Left upper arm: Normal.     Right forearm: Normal.     Left forearm: Normal.     Right hand: Normal.     Left hand: Normal.     Cervical back: Normal, normal range of motion and neck supple.     Right lower leg: No edema.     Left lower leg: No edema.     Comments: No rashes or tenderness left arm or shoulder.  Equal strength bilateral hands, mild weakness left shoulder.  Normal sensation bilaterally and pulses.  Skin:    General: Skin is warm and dry.  Neurological:     Mental Status: She is alert and oriented to person, place, and time.  Psychiatric:        Attention and Perception: Attention normal.        Mood and Affect: Mood normal.        Speech: Speech normal.        Behavior: Behavior normal. Behavior is cooperative.        Thought Content: Thought content normal.     Results for orders placed or performed in visit on 54/36/06  Basic metabolic panel  Result Value Ref Range   Glucose 130 (H) 65 - 99 mg/dL   BUN 18 8 - 27 mg/dL   Creatinine, Ser 1.61 (H) 0.57 - 1.00 mg/dL   GFR calc non Af Amer 34 (L) >59 mL/min/1.73   GFR calc Af Amer 39 (L) >59 mL/min/1.73   BUN/Creatinine Ratio 11 (L) 12 - 28   Sodium 137 134 - 144 mmol/L   Potassium 4.4 3.5 - 5.2 mmol/L   Chloride 103 96 - 106 mmol/L   CO2 19 (L) 20 - 29 mmol/L   Calcium 9.2 8.7 - 10.3 mg/dL  Bayer DCA Hb A1c Waived  Result Value Ref Range   HB A1C (BAYER DCA - WAIVED) 6.0 <7.0 %      Assessment & Plan:   Problem List Items Addressed This Visit      Other   Left arm numbness - Primary    Acute x 3 weeks.  Suspect coming from cervical spine with known degenerative disc, will obtain new imaging of cervical spine -- has not had in some time.  No red flag symptoms.  Continue Gabapentin at home, currently renal dosed.  CrCl 52.  Referral to PT to assist on strengthening and relieve numbness.  Labs today to include uric acid, CRP, ESR, BMP, B12,  and TSH.  Return as scheduled in November for follow-up, sooner if worsening.  Relevant Orders   TSH   Vitamin O25   Basic metabolic panel   Sed Rate (ESR)   C-reactive protein   DG Cervical Spine Complete   Uric acid   Ambulatory referral to Physical Therapy       Follow up plan: Return for as scheduled in November.

## 2020-09-17 ENCOUNTER — Ambulatory Visit
Admission: RE | Admit: 2020-09-17 | Discharge: 2020-09-17 | Disposition: A | Discharge status: Home / Self Care | Payer: Medicare Other | Attending: Nurse Practitioner | Admitting: Nurse Practitioner

## 2020-09-17 ENCOUNTER — Other Ambulatory Visit: Payer: Self-pay | Admitting: Nurse Practitioner

## 2020-09-17 ENCOUNTER — Ambulatory Visit
Admission: RE | Admit: 2020-09-17 | Discharge: 2020-09-17 | Disposition: A | Discharge status: Home / Self Care | Payer: Medicare Other | Source: Ambulatory Visit | Attending: Nurse Practitioner | Admitting: Nurse Practitioner

## 2020-09-17 ENCOUNTER — Other Ambulatory Visit: Payer: Self-pay

## 2020-09-17 DIAGNOSIS — R202 Paresthesia of skin: Secondary | ICD-10-CM | POA: Diagnosis not present

## 2020-09-17 DIAGNOSIS — R200 Anesthesia of skin: Secondary | ICD-10-CM

## 2020-09-17 DIAGNOSIS — N1832 Chronic kidney disease, stage 3b: Secondary | ICD-10-CM

## 2020-09-17 DIAGNOSIS — E1121 Type 2 diabetes mellitus with diabetic nephropathy: Secondary | ICD-10-CM

## 2020-09-17 DIAGNOSIS — Z981 Arthrodesis status: Secondary | ICD-10-CM | POA: Diagnosis not present

## 2020-09-17 DIAGNOSIS — M47812 Spondylosis without myelopathy or radiculopathy, cervical region: Secondary | ICD-10-CM | POA: Diagnosis not present

## 2020-09-17 LAB — BASIC METABOLIC PANEL
BUN/Creatinine Ratio: 13 (ref 12–28)
BUN: 19 mg/dL (ref 8–27)
CO2: 19 mmol/L — ABNORMAL LOW (ref 20–29)
Calcium: 8.9 mg/dL (ref 8.7–10.3)
Chloride: 104 mmol/L (ref 96–106)
Creatinine, Ser: 1.46 mg/dL — ABNORMAL HIGH (ref 0.57–1.00)
GFR calc Af Amer: 44 mL/min/{1.73_m2} — ABNORMAL LOW (ref >59)
GFR calc non Af Amer: 38 mL/min/{1.73_m2} — ABNORMAL LOW (ref >59)
Glucose: 149 mg/dL — ABNORMAL HIGH (ref 65–99)
Potassium: 4.6 mmol/L (ref 3.5–5.2)
Sodium: 139 mmol/L (ref 134–144)

## 2020-09-17 LAB — VITAMIN B12: Vitamin B-12: 947 pg/mL (ref 232–1245)

## 2020-09-17 LAB — C-REACTIVE PROTEIN: CRP: 9 mg/L (ref 0–10)

## 2020-09-17 LAB — URIC ACID: Uric Acid: 6 mg/dL (ref 3.0–7.2)

## 2020-09-17 LAB — SEDIMENTATION RATE: Sed Rate: 22 mm/hr (ref 0–40)

## 2020-09-17 LAB — TSH: TSH: 1.38 u[IU]/mL (ref 0.450–4.500)

## 2020-09-17 NOTE — Progress Notes (Signed)
Contacted via MyChart  Good afternoon Kim Thomas, overall your labs have returned looking good and at baseline.  Your continue to show some stage 3 kidney disease, it may be beneficial in future to get you in with kidney doctor to establish care and assist in monitoring this.  I would like you to think about this.  Overall, labs do not show any reason for current symptoms.  I suspect more a pinched nerve which physical therapy can hopefully help with.  Let me know if any questions. Keep being awesome!!  Thank you for allowing me to participate in your care. Kindest regards, Raysha Tilmon

## 2020-09-18 NOTE — Progress Notes (Signed)
Contacted via MyChart  Good evening Kim Thomas, your imaging has returned.  Overall your hardware from past surgery remains intact.  You do have moderate to severe loss of disc space to cervical spine, suspect this is cause of your intermittent numbness issues through the years.  Most likely pinching on a nerve.  Definitely try physical therapy and we will see how this works, if no benefit then I would recommend we get an MRI to further assess.  Any questions for me? Keep being awesome!!  Thank you for allowing me to participate in your care. Kindest regards, Priscella Donna

## 2020-09-24 ENCOUNTER — Encounter: Payer: Self-pay | Admitting: Podiatry

## 2020-09-24 ENCOUNTER — Other Ambulatory Visit: Payer: Self-pay

## 2020-09-24 ENCOUNTER — Ambulatory Visit (INDEPENDENT_AMBULATORY_CARE_PROVIDER_SITE_OTHER): Payer: Medicare Other

## 2020-09-24 ENCOUNTER — Ambulatory Visit (INDEPENDENT_AMBULATORY_CARE_PROVIDER_SITE_OTHER): Payer: Medicare Other | Admitting: Podiatry

## 2020-09-24 DIAGNOSIS — S92515A Nondisplaced fracture of proximal phalanx of left lesser toe(s), initial encounter for closed fracture: Secondary | ICD-10-CM

## 2020-09-24 NOTE — Progress Notes (Signed)
Subjective:  Patient ID: Kim Thomas Overall, female    DOB: [birth year],  MRN: [id number]  Chief Complaint  Patient presents with  . Fracture    follow up 3rd left toe    "its doing alot better"    64 y.o. female presents with the above complaint.  Patient presents with a follow-up of left third digit fracture.  Patient states that she is doing well.  She does not have any pain.  She has been ambulating with a surgical shoe.  She has been buddy splinting.  She denies any other acute complaints..   Review of Systems: Negative except as noted in the HPI. Denies N/V/F/Ch.  Past Medical History:  Diagnosis Date  . Anxiety   . Chronic kidney disease   . DDD (degenerative disc disease), cervical   . Diabetes mellitus without complication ([place])   . Gout   . Hypertension   . Sleep apnea     Current Outpatient Medications:  .  acetaminophen (TYLENOL) 500 MG tablet, Take 500 mg by mouth daily as needed., Disp: , Rfl:  .  atorvastatin (LIPITOR) 20 MG tablet, TAKE 1 TABLET BY MOUTH  DAILY, Disp: 90 tablet, Rfl: 4 .  BLACK CURRANT SEED OIL PO, Take 1,250 mg by mouth 2 (two) times daily., Disp: , Rfl:  .  Blood Glucose Monitoring Suppl (ONE TOUCH ULTRA MINI) w/Device KIT, As directed, Disp: 1 kit, Rfl: 2 .  CALCIUM CITRATE PO, Take 1,000 mg by mouth daily., Disp: , Rfl:  .  Cholecalciferol (VITAMIN D3) 2000 units TABS, Take 2,000 Units by mouth daily., Disp: , Rfl:  .  Co-Enzyme Q-10 100 MG CAPS, Take 50 mg by mouth daily., Disp: , Rfl:  .  cyclobenzaprine (FLEXERIL) 10 MG tablet, Take 10 mg by mouth at bedtime. Take 1/2 tablet to 1 as needed  At bed time, Disp: , Rfl:  .  diclofenac sodium (VOLTAREN) 1 % GEL, Apply topically., Disp: , Rfl:  .  EQ ALLERGY RELIEF, CETIRIZINE, 10 MG tablet, Take 1 tablet by mouth once daily, Disp: 90 tablet, Rfl: 0 .  fluticasone (FLONASE) 50 MCG/ACT nasal spray, USE 1 TO 2 SPRAY(S) IN EACH NOSTRIL TWICE DAILY, Disp: 16 g, Rfl: 6 .  gabapentin (NEURONTIN) 400 MG  capsule, TAKE 1 CAPSULE BY MOUTH 3  TIMES DAILY, Disp: 270 capsule, Rfl: 3 .  glipiZIDE (GLUCOTROL) 5 MG tablet, Take 0.5 tablets (2.5 mg total) by mouth daily before breakfast., Disp: 30 tablet, Rfl: 11 .  JARDIANCE 25 MG TABS tablet, TAKE 1 TABLET BY MOUTH  DAILY, Disp: 90 tablet, Rfl: 0 .  Lancets (ONETOUCH DELICA PLUS [id number]) MISC, 1 each by Does not apply route 2 (two) times daily., Disp: 100 each, Rfl: 12 .  Multiple Vitamin (MULTIVITAMIN) tablet, Take 1 tablet by mouth daily., Disp: , Rfl:  .  Olopatadine HCl (PATADAY OP), Apply 1 drop to eye daily., Disp: , Rfl:  .  Omega-3 1000 MG CAPS, Take 1 capsule by mouth daily., Disp: , Rfl:  .  omeprazole (PRILOSEC) 20 MG capsule, TAKE 1 CAPSULE BY MOUTH  DAILY, Disp: 90 capsule, Rfl: 3 .  ONETOUCH ULTRA test strip, USE TO CHECK BLOOD SUGAR  DAILY, Disp: 100 strip, Rfl: 3 .  Potassium 99 MG TABS, Take by mouth., Disp: , Rfl:  .  telmisartan (MICARDIS) 40 MG tablet, Take 1 tablet (40 mg total) by mouth daily., Disp: 90 tablet, Rfl: 3 .  triamcinolone cream (KENALOG) 0.1 %, Apply 1 application topically 2 (two)  times daily., Disp: 30 g, Rfl: 2 .  vitamin E 100 UNIT capsule, Take 100 Units by mouth daily., Disp: , Rfl:   Social History   Tobacco Use  Smoking Status Former Smoker  . Packs/day: 0.25  . Quit date: 05/21/2011  . Years since quitting: 9.3  Smokeless Tobacco Never Used    Allergies  Allergen Reactions  . Morphine And Related Itching    It burned, and made patient itch and go out of her mind.  . Erythromycin     "Been so long ago," patient unable to remember reaction  . Hydrocodone-Acetaminophen Itching   Objective:  There were no vitals filed for this visit. There is no height or weight on file to calculate BMI. Constitutional Well developed. Well nourished.  Vascular Dorsalis pedis pulses palpable bilaterally. Posterior tibial pulses palpable bilaterally. Capillary refill normal to all digits.  No cyanosis or  clubbing noted. Pedal hair growth normal.  Neurologic Normal speech. Oriented to person, place, and time. Epicritic sensation to light touch grossly present bilaterally.  Dermatologic Nails well groomed and normal in appearance. No open wounds. No skin lesions.  Orthopedic:  No pain on palpation to the left third digit' \none'  pain on palpation left second digit and mild pain on palpation left fourth digit.  No pain with range of motion at each of the respective metatarsophalangeal joint.  No intra-articular pain noted.  Good clinical alignment and position noted.   Radiographs: Left third digit proximal phalanx fracture intra-articular well position well aligned.  No fractures noted at second and fourth digit.  No other bony abnormalities noted. Assessment:   1. Closed nondisplaced fracture of proximal phalanx of lesser toe of left foot, initial encounter    Plan:  Patient was evaluated and treated and all questions answered.  Left third digit proximal phalanx fracture -Clinically healed.  At this time patient clinically does not have any pain to the third digit.  Radiographically I discussed with the patient that it can take many months for to completely consolidate.  Patient states understanding at this time she can come out of the boot and transition to regular sneakers.  She states understanding if there is any foot and ankle issues arise in the future she will come back and see me.  No follow-ups on file.

## 2020-10-02 ENCOUNTER — Telehealth: Payer: Self-pay | Admitting: Nurse Practitioner

## 2020-10-02 NOTE — Telephone Encounter (Signed)
Patient is calling to see if the referral could be changed to [facility]. Patient is not able to get into [facility] until December. Patient was told if she could get a referral to [facility] she could get in this month. Patient was told that it is covered under her [employer] dual complete insurance. Please advise when the referral has been placed. CB- [phone]  [facility]- [phone]

## 2020-10-02 NOTE — Telephone Encounter (Signed)
Routing to referral team. Can this be resent to the office requested by the patient please?

## 2020-10-02 NOTE — Telephone Encounter (Signed)
I sent referral to [facility]/Called to let patient know she can go ahead and call. She will call ASAP.

## 2020-10-09 DIAGNOSIS — R200 Anesthesia of skin: Secondary | ICD-10-CM | POA: Diagnosis not present

## 2020-10-09 DIAGNOSIS — M6281 Muscle weakness (generalized): Secondary | ICD-10-CM | POA: Diagnosis not present

## 2020-10-09 DIAGNOSIS — M542 Cervicalgia: Secondary | ICD-10-CM | POA: Diagnosis not present

## 2020-10-12 NOTE — Progress Notes (Signed)
Patient came in today to pick up Moore Balance Brace (Bilateral).   Patient was able to don brace independently and brace was check for fit to custom.   Patient was observed walking with brace and gait was improved as well as stability.   Patient was advised of care and wearing instructions.  Advised to notify practice if there were any issues; especially skin irritation.  

## 2020-10-13 DIAGNOSIS — R200 Anesthesia of skin: Secondary | ICD-10-CM | POA: Diagnosis not present

## 2020-10-13 DIAGNOSIS — M542 Cervicalgia: Secondary | ICD-10-CM | POA: Diagnosis not present

## 2020-10-13 DIAGNOSIS — M6281 Muscle weakness (generalized): Secondary | ICD-10-CM | POA: Diagnosis not present

## 2020-10-16 DIAGNOSIS — M542 Cervicalgia: Secondary | ICD-10-CM | POA: Diagnosis not present

## 2020-10-16 DIAGNOSIS — R200 Anesthesia of skin: Secondary | ICD-10-CM | POA: Diagnosis not present

## 2020-10-16 DIAGNOSIS — M6281 Muscle weakness (generalized): Secondary | ICD-10-CM | POA: Diagnosis not present

## 2020-10-20 DIAGNOSIS — M542 Cervicalgia: Secondary | ICD-10-CM | POA: Diagnosis not present

## 2020-10-20 DIAGNOSIS — M6281 Muscle weakness (generalized): Secondary | ICD-10-CM | POA: Diagnosis not present

## 2020-10-20 DIAGNOSIS — R200 Anesthesia of skin: Secondary | ICD-10-CM | POA: Diagnosis not present

## 2020-10-21 ENCOUNTER — Other Ambulatory Visit: Payer: Self-pay | Admitting: Nephrology

## 2020-10-21 ENCOUNTER — Ambulatory Visit: Payer: Medicare Other | Admitting: Nurse Practitioner

## 2020-10-21 DIAGNOSIS — E1122 Type 2 diabetes mellitus with diabetic chronic kidney disease: Secondary | ICD-10-CM | POA: Diagnosis not present

## 2020-10-21 DIAGNOSIS — N1832 Chronic kidney disease, stage 3b: Secondary | ICD-10-CM | POA: Diagnosis not present

## 2020-10-21 DIAGNOSIS — I10 Essential (primary) hypertension: Secondary | ICD-10-CM | POA: Diagnosis not present

## 2020-10-27 ENCOUNTER — Other Ambulatory Visit: Payer: Self-pay

## 2020-10-27 ENCOUNTER — Ambulatory Visit (INDEPENDENT_AMBULATORY_CARE_PROVIDER_SITE_OTHER): Payer: Medicare Other | Admitting: Nurse Practitioner

## 2020-10-27 ENCOUNTER — Ambulatory Visit
Admission: RE | Admit: 2020-10-27 | Discharge: 2020-10-27 | Disposition: A | Discharge status: Home / Self Care | Payer: Medicare Other | Source: Ambulatory Visit | Attending: Nephrology | Admitting: Nephrology

## 2020-10-27 ENCOUNTER — Ambulatory Visit: Payer: Self-pay | Admitting: General Practice

## 2020-10-27 ENCOUNTER — Telehealth: Payer: Self-pay | Admitting: General Practice

## 2020-10-27 ENCOUNTER — Encounter: Payer: Self-pay | Admitting: Nurse Practitioner

## 2020-10-27 VITALS — BP 114/74 | HR 84 | Temp 98.4°F | Wt 208.6 lb

## 2020-10-27 DIAGNOSIS — R200 Anesthesia of skin: Secondary | ICD-10-CM

## 2020-10-27 DIAGNOSIS — N1832 Chronic kidney disease, stage 3b: Secondary | ICD-10-CM | POA: Diagnosis not present

## 2020-10-27 DIAGNOSIS — I152 Hypertension secondary to endocrine disorders: Secondary | ICD-10-CM

## 2020-10-27 DIAGNOSIS — E1122 Type 2 diabetes mellitus with diabetic chronic kidney disease: Secondary | ICD-10-CM

## 2020-10-27 DIAGNOSIS — J301 Allergic rhinitis due to pollen: Secondary | ICD-10-CM

## 2020-10-27 DIAGNOSIS — E785 Hyperlipidemia, unspecified: Secondary | ICD-10-CM

## 2020-10-27 DIAGNOSIS — N183 Chronic kidney disease, stage 3 unspecified: Secondary | ICD-10-CM

## 2020-10-27 DIAGNOSIS — E1159 Type 2 diabetes mellitus with other circulatory complications: Secondary | ICD-10-CM

## 2020-10-27 DIAGNOSIS — E1121 Type 2 diabetes mellitus with diabetic nephropathy: Secondary | ICD-10-CM

## 2020-10-27 DIAGNOSIS — N281 Cyst of kidney, acquired: Secondary | ICD-10-CM | POA: Diagnosis not present

## 2020-10-27 DIAGNOSIS — E1169 Type 2 diabetes mellitus with other specified complication: Secondary | ICD-10-CM

## 2020-10-27 LAB — BAYER DCA HB A1C WAIVED: HB A1C (BAYER DCA - WAIVED): 6.2 % (ref <7.0)

## 2020-10-27 NOTE — Assessment & Plan Note (Signed)
Chronic, ongoing.  Continue Telmisartan for kidney protection.  Check BMP today.  Continue collaboration with nephrology, appreciate their input. 

## 2020-10-27 NOTE — Chronic Care Management (AMB) (Signed)
Chronic Care Management   Follow Up Note   10/27/2020 Name: Kim Thomas MRN: [id number] DOB: 05/29/1956  Referred by: Venita Lick, NP Reason for referral : Chronic Care Management (RNCM Chronic Disease Management and [place])   Kim Thomas is a 64 y.o. year old female who is a primary care patient of Cannady, [name], NP. The CCM team was consulted for assistance with chronic disease management and care coordination needs.    Review of patient status, including review of consultants reports, relevant laboratory and other test results, and collaboration with appropriate care team members and the patient's provider was performed as part of comprehensive patient evaluation and provision of chronic care management services.    SDOH (Social Determinants of Health) assessments performed: Yes See Care Plan activities for detailed interventions related to [facility])     Outpatient Encounter Medications as of 10/27/2020  Medication Sig Note  . acetaminophen (TYLENOL) 500 MG tablet Take 500 mg by mouth daily as needed. 07/13/2020: Taking one tablet twice a day.  [name] atorvastatin (LIPITOR) 20 MG tablet TAKE 1 TABLET BY MOUTH  DAILY   . BLACK CURRANT SEED OIL PO Take 1,250 mg by mouth 2 (two) times daily.   . Blood Glucose Monitoring Suppl (ONE TOUCH ULTRA MINI) w/Device KIT As directed   . CALCIUM CITRATE PO Take 1,000 mg by mouth daily.   . Cholecalciferol (VITAMIN D3) 2000 units TABS Take 2,000 Units by mouth daily.   [name] Co-Enzyme Q-10 100 MG CAPS Take 50 mg by mouth daily.   . cyclobenzaprine (FLEXERIL) 10 MG tablet Take 10 mg by mouth at bedtime. Take 1/2 tablet to 1 as needed  At bed time   . diclofenac sodium (VOLTAREN) 1 % GEL Apply topically.   [name] ALLERGY RELIEF, CETIRIZINE, 10 MG tablet Take 1 tablet by mouth once daily   . fluticasone (FLONASE) 50 MCG/ACT nasal spray USE 1 TO 2 SPRAY(S) IN EACH NOSTRIL TWICE DAILY   . gabapentin (NEURONTIN) 400 MG capsule TAKE 1  CAPSULE BY MOUTH 3  TIMES DAILY   . glipiZIDE (GLUCOTROL) 5 MG tablet Take 0.5 tablets (2.5 mg total) by mouth daily before breakfast.   . JARDIANCE 25 MG TABS tablet TAKE 1 TABLET BY MOUTH  DAILY   . Lancets (ONETOUCH DELICA PLUS [id number]) MISC 1 each by Does not apply route 2 (two) times daily.   . Multiple Vitamin (MULTIVITAMIN) tablet Take 1 tablet by mouth daily.   . Olopatadine HCl (PATADAY OP) Apply 1 drop to eye daily.   . Omega-3 1000 MG CAPS Take 1 capsule by mouth daily.   [name] omeprazole (PRILOSEC) 20 MG capsule TAKE 1 CAPSULE BY MOUTH  DAILY   . ONETOUCH ULTRA test strip USE TO CHECK BLOOD SUGAR  DAILY   . Potassium 99 MG TABS Take by mouth.   . telmisartan (MICARDIS) 40 MG tablet Take 1 tablet (40 mg total) by mouth daily.   [name] triamcinolone cream (KENALOG) 0.1 % Apply 1 application topically 2 (two) times daily.   . vitamin E 100 UNIT capsule Take 100 Units by mouth daily.    No facility-administered encounter medications on file as of 10/27/2020.     Objective:  BP Readings from Last 3 Encounters:  09/16/20 119/75  07/21/20 112/71  07/13/20 116/75    Goals Addressed              This Visit's Progress   .  RN- Managing chronic diseases (pt-stated)  Current Barriers:  . Chronic Disease Management support, education, and care coordination needs related to HTN, HLD, DMII, and CKD Stage 3 . Does not have a blood pressure cuff-plans to get one soon  Clinical Goal(s) related to HTN, HLD, DMII, and CKD Stage 3 :  Over the next 120 days, patient will:  . Work with the care management team to address educational, disease management, and care coordination needs  . Begin or continue self health monitoring activities as directed today Measure and record cbg (blood glucose) x1 times daily and Measure and record blood pressure 2 times per week . Call provider office for new or worsened signs and symptoms Blood glucose findings outside established parameters . Call care  management team with questions or concerns . Verbalize basic understanding of patient centered plan of care established today  Interventions related to HTN, HLD, DMII, and CKD Stage 3 :  . Evaluation of current treatment plans and patient's adherence to plan as established by provider. The patient has seen the pcp recently.  Review of chronic conditions and plan of care. 10-27-2020: The patient states she is doing pretty good with her chronic conditions. She is watching what she eats and is also taking steps to learn what she can about her kidney function. She comes to see the pcp today and is hopeful that even with Thanksgiving that her hemoglobin A1C remains good. She is being mindful of what she eats.  She also says the physical therapy is helping her left arm and hand numbness. The numbness in her hand is completely gone now.  . Assessed patient understanding of disease states- 09-01-2020: The patient saw the pcp in August and her hemoglobin A1C was 6.0.  She has also lost 10 pounds. She states she doesn't really know what she did because she did not make any changes, but she is happy and feels great. 10-27-2020: Has a good understanding of chronic conditions. Has signed up for a class to learn more about her CKD3.   . Assessed patient's education and care coordination needs.  10-27-2020: The patient states she is taking steps to enroll in a class to learn more about her kidney function and disease.  She wants to learn all she can to help her maintain her health and well being.  . Provided disease specific education to patient.  09-01-2020: The patient is having some "nerve sensations" and noticing it more since she had the injury to her toe.  She is going to discuss this with the podiatrist and ask the podiatrist his thoughts. Educated on writing questions down to ask the provider at the visit. This is concerning her and the last time she had this was when she was having issues with her neck in 2012.  For  some reason she noticed this when she stumped her toe and it is something she wants to get checked out. 10-27-2020: Will see the patient in the office today and supply a calendar book with educational information and also a booklet with DM information along with healthy eating chart.  The patient states she wants to be well informed.  Nash Dimmer with appropriate clinical care team members regarding patient needs.  The patient knows about the LCSW and pharmacist. Denies any needs at this time.  . Education on the OTC benefit through [facility] and the monitoring program through [facility] where she can get a blood pressure cuff to use to monitor her blood pressures and record on a daily basis- the patient currently  does not have a blood pressure cuff, but is aware of the over the counter benefit, the housecalls benefit and has the life alert system.  09-01-2020: The patient has a new card number and it is [id number].  Instructed the patient to bring it in the next times she comes so it can be updated in the system.  . Assessed upcoming appointments. The patient sees the pcp today at 11:20 am.  She has several specialist appointments and dental appointments. She has to keep a calendar to record all of her appointments.   Patient Self Care Activities related to HTN, HLD, DMII, and CKD Stage 3 :  . Patient is unable to independently self-manage chronic health conditions  Please see past updates related to this goal by clicking on the "Past Updates" button in the selected goal           Plan:   Telephone follow up appointment with care management team member scheduled for: 12-15-2020 at 0900 am   [place], MSN, [facility] Mobile: [phone]

## 2020-10-27 NOTE — Assessment & Plan Note (Signed)
Chronic, ongoing.  A1C today 6.2%, praised for success at maintaining below goal.  Continue Jardiance at this time with goal to someday d/c if possible + Glipizide on her current schedule.  Continue increased diet focus and monitoring BS twice a day.  Recommend focus on healthy weight loss and exercise daily.  Return in 3 months.

## 2020-10-27 NOTE — Assessment & Plan Note (Signed)
Chronic, ongoing.  Continue current medication regimen and adjust as needed. Lipid panel today. 

## 2020-10-27 NOTE — Progress Notes (Signed)
BP 114/74   Pulse 84   Temp 98.4 F (36.9 C)   Wt 208 lb 9.6 oz (94.6 kg)   LMP  (LMP Unknown)   SpO2 99%   BMI 35.81 kg/m    Subjective:    Patient ID: Kim Thomas [place], female    DOB: [birth year], 64 y.o.   MRN: [id number]  HPI: Kim Thomas is a 64 y.o. female  Chief Complaint  Patient presents with  . Diabetes  . Form Completion    pt requesting form to be signed for disability license plate    DIABETES Continues on Glipizide 2.5 MG (taking 3-4 times a week, not daily) and Jardiance 25 MG daily. August A1C 6%.Her goal is to come completely off medication, she reports she has been focusing on diet more now since her education, less starch.  Hypoglycemic episodes:no Polydipsia/polyuria: no Visual disturbance: no Chest pain: no Paresthesias: no Glucose Monitoring: yes             Accucheck frequency: occasionally             Fasting glucose: 110 - 120 -- mainly <130             Post prandial:             Evening:              Before meals:  Taking Insulin?: no             Long acting insulin:             Short acting insulin: Blood Pressure Monitoring: not checking Retinal Examination: Not Up to Date -- is working on getting scheduled -- Dr. Herbert Deaner in January Foot Exam: Up to Date Diabetic Education: Completed Pneumovax: refused Influenza: refused Aspirin: yes  HYPERTENSION / HYPERLIPIDEMIA Continues on Atorvastatin 20 MG daily and Telmisartan 40 MG.She would like to come off of medications someday, which provider discussed at length with her today. Her goal is to focus more on natural supplements someday.  Her last LDL in April -- 62. Satisfied with current treatment? yes Duration of hypertension: chronic BP monitoring frequency: not checking BP range:  BP medication side effects: no Duration of hyperlipidemia: chronic Cholesterol medication side effects: no Cholesterol supplements: none Medication compliance: good compliance Aspirin: yes Recent  stressors: no Recurrent headaches: no Visual changes: no Palpitations: no Dyspnea: no Chest pain: no Lower extremity edema: no Dizzy/lightheaded: no  CHRONIC KIDNEY DISEASE Last labs in October -- CRT 1.46 and GFR 44, BUN 19.  She had initial visit with nephrology, Dr. Candiss Norse, on 10/21/20 -- labs CRT 1.31, GFR 50, PTH 31, H/H 13.4/40.6 -- no changes made to regimen. CKD status: stable Medications renally dose: yes Previous renal evaluation: yes Pneumovax:  Not up to Date Influenza Vaccine:  Not up to Date   EAR DISCOMFORT She reports when sun directly hits her ear it hurts terribly + ear buds bother her ears.  At this time she reports pressure to the left ear only.  She requests referral to ENT -- feels some of this pressure is related to her allergies.   Duration: weeks Involved ear(s): left Fever: no Otorrhea: no Upper respiratory infection symptoms: no Pruritus: occasional Hearing loss: no Water immersion no Using Q-tips: no Recurrent otitis media: no Status: stable Treatments attempted: none  Relevant past medical, surgical, family and social history reviewed and updated as indicated. Interim medical history since our last visit reviewed. Allergies and medications reviewed and updated.  Review  of Systems  Constitutional: Negative for activity change, appetite change, diaphoresis, fatigue and fever.  Respiratory: Negative for cough, chest tightness and shortness of breath.   Cardiovascular: Negative for chest pain, palpitations and leg swelling.  Gastrointestinal: Negative.   Endocrine: Negative for cold intolerance, heat intolerance, polydipsia, polyphagia and polyuria.  Neurological: Negative.   Psychiatric/Behavioral: Negative.     Per HPI unless specifically indicated above     Objective:    BP 114/74   Pulse 84   Temp 98.4 F (36.9 C)   Wt 208 lb 9.6 oz (94.6 kg)   LMP  (LMP Unknown)   SpO2 99%   BMI 35.81 kg/m   Wt Readings from Last 3 Encounters:   10/27/20 208 lb 9.6 oz (94.6 kg)  09/16/20 206 lb (93.4 kg)  08/14/20 198 lb (89.8 kg)    Physical Exam Vitals and nursing note reviewed.  Constitutional:      General: She is awake. She is not in acute distress.    Appearance: She is well-developed. She is obese. She is not ill-appearing.  HENT:     Head: Normocephalic.     Right Ear: Hearing, tympanic membrane, ear canal and external ear normal.     Left Ear: Hearing, tympanic membrane, ear canal and external ear normal.     Ears:     Comments: Scant cerumen bilaterally, no erythema or effusion noted.  Bony landmarks viewed bilaterally. Eyes:     General: Lids are normal.        Right eye: No discharge.        Left eye: No discharge.     Conjunctiva/sclera: Conjunctivae normal.     Pupils: Pupils are equal, round, and reactive to light.  Neck:     Vascular: No carotid bruit.  Cardiovascular:     Rate and Rhythm: Normal rate and regular rhythm.     Heart sounds: Normal heart sounds. No murmur heard.  No gallop.   Pulmonary:     Effort: Pulmonary effort is normal. No accessory muscle usage or respiratory distress.     Breath sounds: Normal breath sounds.  Abdominal:     General: Bowel sounds are normal.     Palpations: Abdomen is soft.  Musculoskeletal:     Cervical back: Normal range of motion and neck supple.     Right lower leg: No edema.     Left lower leg: No edema.  Skin:    General: Skin is warm and dry.  Neurological:     Mental Status: She is alert and oriented to person, place, and time.  Psychiatric:        Attention and Perception: Attention normal.        Mood and Affect: Mood normal.        Speech: Speech normal.        Behavior: Behavior normal. Behavior is cooperative.        Thought Content: Thought content normal.    Results for orders placed or performed in visit on 09/16/20  TSH  Result Value Ref Range   TSH 1.380 0.450 - 4.500 uIU/mL  Vitamin B12  Result Value Ref Range   Vitamin B-12 947  232 - 1,245 pg/mL  Basic metabolic panel  Result Value Ref Range   Glucose 149 (H) 65 - 99 mg/dL   BUN 19 8 - 27 mg/dL   Creatinine, Ser 1.46 (H) 0.57 - 1.00 mg/dL   GFR calc non Af Amer 38 (L) >59 mL/min/1.73  GFR calc Af Amer 44 (L) >59 mL/min/1.73   BUN/Creatinine Ratio 13 12 - 28   Sodium 139 134 - 144 mmol/L   Potassium 4.6 3.5 - 5.2 mmol/L   Chloride 104 96 - 106 mmol/L   CO2 19 (L) 20 - 29 mmol/L   Calcium 8.9 8.7 - 10.3 mg/dL  Sed Rate (ESR)  Result Value Ref Range   Sed Rate 22 0 - 40 mm/hr  C-reactive protein  Result Value Ref Range   CRP 9 0 - 10 mg/L  Uric acid  Result Value Ref Range   Uric Acid 6.0 3.0 - 7.2 mg/dL      Assessment & Plan:   Problem List Items Addressed This Visit      Cardiovascular and Mediastinum   Hypertension associated with diabetes (HCC)    Chronic, ongoing.  Below goal with BP today.  Recommend she check BP at home three mornings a week and document for provider.  Continue current medication regimen and adjust as needed.  BMP today.  Educated her on Telmisartan and kidney benefit.  Urine micro alb 10 and A:C 30-300 at beginning of year.  Focus on DASH diet at home.  Return in 3 months.      Relevant Medications   Aspirin (VAZALORE) 81 MG CAPS   Other Relevant Orders   Bayer DCA Hb A1c Waived   Basic metabolic panel     Respiratory   Allergic rhinitis    Ongoing which she reports bothers her ears, will place referral to ENT per her request today for further evaluation and recommendations.      Relevant Orders   Ambulatory referral to ENT     Endocrine   Type 2 diabetes mellitus with diabetic nephropathy ([place]) - Primary    Chronic, ongoing.  A1C today 6.2%, praised for success at maintaining below goal.  Continue Jardiance at this time with goal to someday d/c if possible + Glipizide on her current schedule.  Continue increased diet focus and monitoring BS twice a day.  Recommend focus on healthy weight loss and exercise daily.   Return in 3 months.      Relevant Medications   Aspirin (VAZALORE) 81 MG CAPS   Other Relevant Orders   Bayer DCA Hb A1c Waived   CKD stage 3 due to type 2 diabetes mellitus (HCC)    Chronic, ongoing.  Continue Telmisartan for kidney protection.  Check BMP today.  Continue collaboration with nephrology, appreciate their input.      Relevant Medications   Aspirin (VAZALORE) 81 MG CAPS   Hyperlipidemia associated with type 2 diabetes mellitus (HCC)    Chronic, ongoing.  Continue current medication regimen and adjust as needed.  Lipid panel today.      Relevant Medications   Aspirin (VAZALORE) 81 MG CAPS   Other Relevant Orders   Bayer DCA Hb A1c Waived   Lipid Panel w/o Chol/HDL Ratio       Follow up plan: Return in about 3 months (around 01/25/2021) for T2DM, HTN/HLD, CKD 3, THYROID CHECK.

## 2020-10-27 NOTE — Assessment & Plan Note (Signed)
Chronic, ongoing.  Below goal with BP today.  Recommend she check BP at home three mornings a week and document for provider.  Continue current medication regimen and adjust as needed.  BMP today.  Educated her on Telmisartan and kidney benefit.  Urine micro alb 10 and A:C 30-300 at beginning of year.  Focus on DASH diet at home.  Return in 3 months.

## 2020-10-27 NOTE — Patient Instructions (Signed)
Visit Information  Goals Addressed              This Visit's Progress   .  RN- Managing chronic diseases (pt-stated)        Current Barriers:  . Chronic Disease Management support, education, and care coordination needs related to HTN, HLD, DMII, and CKD Stage 3 . Does not have a blood pressure cuff-plans to get one soon  Clinical Goal(s) related to HTN, HLD, DMII, and CKD Stage 3 :  Over the next 120 days, patient will:  . Work with the care management team to address educational, disease management, and care coordination needs  . Begin or continue self health monitoring activities as directed today Measure and record cbg (blood glucose) x1 times daily and Measure and record blood pressure 2 times per week . Call provider office for new or worsened signs and symptoms Blood glucose findings outside established parameters . Call care management team with questions or concerns . Verbalize basic understanding of patient centered plan of care established today  Interventions related to HTN, HLD, DMII, and CKD Stage 3 :  . Evaluation of current treatment plans and patient's adherence to plan as established by provider. The patient has seen the pcp recently.  Review of chronic conditions and plan of care. 10-27-2020: The patient states she is doing pretty good with her chronic conditions. She is watching what she eats and is also taking steps to learn what she can about her kidney function. She comes to see the pcp today and is hopeful that even with Thanksgiving that her hemoglobin A1C remains good. She is being mindful of what she eats.  She also says the physical therapy is helping her left arm and hand numbness. The numbness in her hand is completely gone now.  . Assessed patient understanding of disease states- 09-01-2020: The patient saw the pcp in August and her hemoglobin A1C was 6.0.  She has also lost 10 pounds. She states she doesn't really know what she did because she did not make any  changes, but she is happy and feels great. 10-27-2020: Has a good understanding of chronic conditions. Has signed up for a class to learn more about her CKD3.   . Assessed patient's education and care coordination needs.  10-27-2020: The patient states she is taking steps to enroll in a class to learn more about her kidney function and disease.  She wants to learn all she can to help her maintain her health and well being.  . Provided disease specific education to patient.  09-01-2020: The patient is having some "nerve sensations" and noticing it more since she had the injury to her toe.  She is going to discuss this with the podiatrist and ask the podiatrist his thoughts. Educated on writing questions down to ask the provider at the visit. This is concerning her and the last time she had this was when she was having issues with her neck in 2012.  For some reason she noticed this when she stumped her toe and it is something she wants to get checked out. 10-27-2020: Will see the patient in the office today and supply a calendar book with educational information and also a booklet with DM information along with healthy eating chart.  The patient states she wants to be well informed.  Steele Sizer with appropriate clinical care team members regarding patient needs.  The patient knows about the LCSW and pharmacist. Denies any needs at this time.  . Education  [facility] and [facility] where she can get a blood pressure cuff to use to monitor her blood pressures and record on a daily basis- the patient currently does not have a blood pressure cuff, but is aware of the over the counter benefit, the housecalls benefit and has the life alert system.  09-01-2020: The patient has a new card number and it is [id number].  Instructed the patient to bring it in the next times she comes so it can be updated in the system.  . Assessed upcoming appointments. The patient sees the pcp  today at 11:20 am.  She has several specialist appointments and dental appointments. She has to keep a calendar to record all of her appointments.   Patient Self Care Activities related to HTN, HLD, DMII, and CKD Stage 3 :  . Patient is unable to independently self-manage chronic health conditions  Please see past updates related to this goal by clicking on the "Past Updates" button in the selected goal         Print copy of patient instructions, educational materials, and care plan provided in person.  Telephone follow up appointment with care management team member scheduled for: 12-15-2020 at 0900 am  Alto DenverPam Tate RN, MSN, CCM Community Care Coordinator [place]  [facility] Mobile: [phone]  Food Basics for Chronic Kidney Disease When your kidneys are not working well, they cannot remove waste and excess substances from your blood as effectively as they did before. This can lead to a buildup and imbalance of these substances, which can worsen kidney damage and affect how your body functions. Certain foods lead to a buildup of these substances in the body. By changing your diet as recommended by your diet and nutrition specialist (dietitian) or health care provider, you could help prevent further kidney damage and delay or prevent the need for dialysis. What are tips for following this plan? General instructions  Work with your health care provider and dietitian to develop a meal plan that is right for you. Foods you can eat, limit, or avoid will be different for each person depending on the stage of kidney disease and any other existing health conditions. Talk with your health care provider about whether you should take a vitamin and mineral supplement. Use standard measuring cups and spoons to measure servings of foods. Use a kitchen scale to measure portions of protein foods. If directed by your health care provider, avoid drinking too much  fluid. Measure and count all liquids, including water, ice, soups, flavored gelatin, and frozen desserts such as popsicles or ice cream. Reading food labels Check the amount of sodium in foods. Choose foods that have less than 300 milligrams (mg) per serving. Check the ingredient list for phosphorus or potassium-based additives or preservatives. Check the amount of saturated and trans fat. Limit or avoid these fats as told by your dietitian. Shopping Avoid buying foods that are: Processed, frozen, or prepackaged. Calcium-enriched or fortified. Do not buy foods that have salt or sodium listed among the first five ingredients. Do not buy canned vegetables. Cooking Replace animal proteins, such as meat, fish, eggs, or dairy, with plant proteins from beans, nuts, and soy. Use soy milk instead of cow's milk. Add beans or tofu to soups, casseroles, or pasta dishes instead of meat. Soak vegetables, such as potatoes, before cooking to reduce potassium. To do this: Peel and cut into small pieces. Soak in warm water  for at least 2 hours. For every 1 cup of vegetables, use 10 cups of water. Drain and rinse with warm water. Boil for at least 5 minutes. Meal planning Limit the amount of protein from plant and animal sources you eat each day. Do not add salt to food when cooking or before eating. Eat meals and snacks at around the same time each day. If you have diabetes: If you have diabetes (diabetes mellitus) and chronic kidney disease, it is important to keep your blood glucose in the target range recommended by your health care provider. Follow your diabetes management plan. This may include: Checking your blood glucose regularly. Taking oral medicines, insulin, or both. Exercising for at least 30 minutes on 5 or more days each week, or as told by your health care provider. Tracking how many servings of carbohydrates you eat at each meal. You may be given specific guidelines on how much of  certain foods and nutrients you may eat, depending on your stage of kidney disease and whether you have high blood pressure (hypertension). Follow your meal plan as told by your dietitian. What nutrients should be limited? The items listed are not a complete list. Talk with your dietitian about what dietary choices are best for you. Potassium Potassium affects how steadily your heart beats. If too much potassium builds up in your blood, it can cause an irregular heartbeat or even a heart attack. You may need to eat less potassium, depending on your blood potassium levels and the stage of kidney disease. Talk to your dietitian about how much potassium you may have each day. You may need to limit or avoid foods that are high in potassium, such as: Milk and soy milk. Fruits, such as bananas, papaya, apricots, nectarines, melon, prunes, raisins, kiwi, and oranges. Vegetables, such as potatoes, sweet potatoes, yams, tomatoes, leafy greens, beets, okra, avocado, pumpkin, and winter squash. White and lima beans. Phosphorus Phosphorus is a mineral found in your bones. A balance between calcium and phosphorous is needed to build and maintain healthy bones. Too much phosphorus pulls calcium from your bones. This can make your bones weak and more likely to break. Too much phosphorus can also make your skin itch. You may need to eat less phosphorus depending on your blood phosphorus levels and the stage of kidney disease. Talk to your dietitian about how much potassium you may have each day. You may need to take medicine to lower your blood phosphorus levels if diet changes do not help. You may need to limit or avoid foods that are high in phosphorus, such as: Milk and dairy products. Dried beans and peas. Tofu, soy milk, and other soy-based meat replacements. Colas. Nuts and peanut butter. Meat, poultry, and fish. Bran cereals and oatmeals. Protein Protein helps you to make and keep muscle. It also helps  in the repair of your body's cells and tissues. One of the natural breakdown products of protein is a waste product called urea. When your kidneys are not working properly, they cannot remove wastes, such as urea, like they did before you developed chronic kidney disease. Reducing how much protein you eat can help prevent a buildup of urea in your blood. Depending on your stage of kidney disease, you may need to limit foods that are high in protein. Sources of animal protein include: Meat (all types). Fish and seafood. Poultry. Eggs. Dairy. Other protein foods include: Beans and legumes. Nuts and nut butter. Soy and tofu. Sodium Sodium, which is found  in salt, helps maintain a healthy balance of fluids in your body. Too much sodium can increase your blood pressure and have a negative effect on the function of your heart and lungs. Too much sodium can also cause your body to retain too much fluid, making your kidneys work harder. Most people should have less than 2,300 milligrams (mg) of sodium each day. If you have hypertension, you may need to limit your sodium to 1,500 mg each day. Talk to your dietitian about how much sodium you may have each day. You may need to limit or avoid foods that are high in sodium, such as: Salt seasonings. Soy sauce. Cured and processed meats. Salted crackers and snack foods. Fast food. Canned soups and most canned foods. Pickled foods. Vegetable juice. Boxed mixes or ready-to-eat boxed meals and side dishes. Bottled dressings, sauces, and marinades. Summary Chronic kidney disease can lead to a buildup and imbalance of waste and excess substances in the body. Certain foods lead to a buildup of these substances. By adjusting your intake of these foods, you could help prevent more kidney damage and delay or prevent the need for dialysis. Food adjustments are different for each person with chronic kidney disease. Work with a dietitian to set up nutrient goals  and a meal plan that is right for you. If you have diabetes and chronic kidney disease, it is important to keep your blood glucose in the target range recommended by your health care provider. This information is not intended to replace advice given to you by your health care provider. Make sure you discuss any questions you have with your health care provider. Document Revised: 03/07/2019 Document Reviewed: 11/09/2016 Elsevier Patient Education  2020 [employer].

## 2020-10-27 NOTE — Patient Instructions (Signed)

## 2020-10-27 NOTE — Assessment & Plan Note (Signed)
Ongoing which she reports bothers her ears, will place referral to ENT per her request today for further evaluation and recommendations.

## 2020-10-28 ENCOUNTER — Ambulatory Visit: Payer: Medicare Other

## 2020-10-28 LAB — LIPID PANEL W/O CHOL/HDL RATIO
Cholesterol, Total: 150 mg/dL (ref 100–199)
HDL: 59 mg/dL (ref >39)
LDL Chol Calc (NIH): 73 mg/dL (ref 0–99)
Triglycerides: 97 mg/dL (ref 0–149)
VLDL Cholesterol Cal: 18 mg/dL (ref 5–40)

## 2020-10-28 LAB — BASIC METABOLIC PANEL
BUN/Creatinine Ratio: 16 (ref 12–28)
BUN: 19 mg/dL (ref 8–27)
CO2: 22 mmol/L (ref 20–29)
Calcium: 9.7 mg/dL (ref 8.7–10.3)
Chloride: 104 mmol/L (ref 96–106)
Creatinine, Ser: 1.21 mg/dL — ABNORMAL HIGH (ref 0.57–1.00)
GFR calc Af Amer: 55 mL/min/{1.73_m2} — ABNORMAL LOW (ref >59)
GFR calc non Af Amer: 47 mL/min/{1.73_m2} — ABNORMAL LOW (ref >59)
Glucose: 118 mg/dL — ABNORMAL HIGH (ref 65–99)
Potassium: 4.9 mmol/L (ref 3.5–5.2)
Sodium: 140 mmol/L (ref 134–144)

## 2020-10-28 NOTE — Progress Notes (Signed)
Contacted via MyChart  Good evening Kim Thomas, your labs have returned and kidney function continues to show mild kidney disease, but improved from last check.  Continue all current medications and good water intake daily.  Electrolytes look good.  Cholesterol levels are at goal, continue Atorvastatin daily as these are in stroke prevention goal range.  Have a wonderful evening!! Keep being awesome!!  Thank you for allowing me to participate in your care. Kindest regards, Calab Sachse

## 2020-10-30 ENCOUNTER — Telehealth: Payer: Self-pay

## 2020-10-30 DIAGNOSIS — M6281 Muscle weakness (generalized): Secondary | ICD-10-CM | POA: Diagnosis not present

## 2020-10-30 DIAGNOSIS — M542 Cervicalgia: Secondary | ICD-10-CM | POA: Diagnosis not present

## 2020-10-30 DIAGNOSIS — R200 Anesthesia of skin: Secondary | ICD-10-CM | POA: Diagnosis not present

## 2020-10-30 NOTE — Telephone Encounter (Signed)
Copied from CRM [id number]. Topic: General - Other >> Oct 30, 2020  2:58 PM Laural Benes, [place] C wrote: Reason for CRM: pt called in stating that she is returning the office call. Not showing a note in chart.   Please assist.

## 2020-10-30 NOTE — Telephone Encounter (Signed)
Gave patient a call back informing we do not have documentation of who called. Patient said no voicemail was left. Patient was not expecting a call so was not concerned.

## 2020-11-03 DIAGNOSIS — R200 Anesthesia of skin: Secondary | ICD-10-CM | POA: Diagnosis not present

## 2020-11-03 DIAGNOSIS — M6281 Muscle weakness (generalized): Secondary | ICD-10-CM | POA: Diagnosis not present

## 2020-11-03 DIAGNOSIS — M542 Cervicalgia: Secondary | ICD-10-CM | POA: Diagnosis not present

## 2020-11-05 DIAGNOSIS — R200 Anesthesia of skin: Secondary | ICD-10-CM | POA: Diagnosis not present

## 2020-11-05 DIAGNOSIS — M6281 Muscle weakness (generalized): Secondary | ICD-10-CM | POA: Diagnosis not present

## 2020-11-05 DIAGNOSIS — M542 Cervicalgia: Secondary | ICD-10-CM | POA: Diagnosis not present

## 2020-11-10 DIAGNOSIS — M542 Cervicalgia: Secondary | ICD-10-CM | POA: Diagnosis not present

## 2020-11-10 DIAGNOSIS — M6281 Muscle weakness (generalized): Secondary | ICD-10-CM | POA: Diagnosis not present

## 2020-11-10 DIAGNOSIS — R200 Anesthesia of skin: Secondary | ICD-10-CM | POA: Diagnosis not present

## 2020-11-12 DIAGNOSIS — M6281 Muscle weakness (generalized): Secondary | ICD-10-CM | POA: Diagnosis not present

## 2020-11-12 DIAGNOSIS — M542 Cervicalgia: Secondary | ICD-10-CM | POA: Diagnosis not present

## 2020-11-12 DIAGNOSIS — R200 Anesthesia of skin: Secondary | ICD-10-CM | POA: Diagnosis not present

## 2020-11-16 ENCOUNTER — Other Ambulatory Visit: Payer: Self-pay | Admitting: Nurse Practitioner

## 2020-11-16 DIAGNOSIS — H6123 Impacted cerumen, bilateral: Secondary | ICD-10-CM | POA: Diagnosis not present

## 2020-11-16 DIAGNOSIS — M6281 Muscle weakness (generalized): Secondary | ICD-10-CM | POA: Diagnosis not present

## 2020-11-16 DIAGNOSIS — J309 Allergic rhinitis, unspecified: Secondary | ICD-10-CM | POA: Diagnosis not present

## 2020-11-16 DIAGNOSIS — M542 Cervicalgia: Secondary | ICD-10-CM | POA: Diagnosis not present

## 2020-11-16 DIAGNOSIS — H9209 Otalgia, unspecified ear: Secondary | ICD-10-CM | POA: Diagnosis not present

## 2020-11-16 DIAGNOSIS — R200 Anesthesia of skin: Secondary | ICD-10-CM | POA: Diagnosis not present

## 2020-11-18 DIAGNOSIS — M542 Cervicalgia: Secondary | ICD-10-CM | POA: Diagnosis not present

## 2020-11-18 DIAGNOSIS — M6281 Muscle weakness (generalized): Secondary | ICD-10-CM | POA: Diagnosis not present

## 2020-11-18 DIAGNOSIS — R200 Anesthesia of skin: Secondary | ICD-10-CM | POA: Diagnosis not present

## 2020-11-23 DIAGNOSIS — M542 Cervicalgia: Secondary | ICD-10-CM | POA: Diagnosis not present

## 2020-11-23 DIAGNOSIS — M6281 Muscle weakness (generalized): Secondary | ICD-10-CM | POA: Diagnosis not present

## 2020-11-23 DIAGNOSIS — R200 Anesthesia of skin: Secondary | ICD-10-CM | POA: Diagnosis not present

## 2020-12-01 DIAGNOSIS — R200 Anesthesia of skin: Secondary | ICD-10-CM | POA: Diagnosis not present

## 2020-12-01 DIAGNOSIS — M6281 Muscle weakness (generalized): Secondary | ICD-10-CM | POA: Diagnosis not present

## 2020-12-01 DIAGNOSIS — M542 Cervicalgia: Secondary | ICD-10-CM | POA: Diagnosis not present

## 2020-12-02 DIAGNOSIS — M542 Cervicalgia: Secondary | ICD-10-CM | POA: Diagnosis not present

## 2020-12-02 DIAGNOSIS — M6281 Muscle weakness (generalized): Secondary | ICD-10-CM | POA: Diagnosis not present

## 2020-12-02 DIAGNOSIS — R200 Anesthesia of skin: Secondary | ICD-10-CM | POA: Diagnosis not present

## 2020-12-08 DIAGNOSIS — M6281 Muscle weakness (generalized): Secondary | ICD-10-CM | POA: Diagnosis not present

## 2020-12-08 DIAGNOSIS — M542 Cervicalgia: Secondary | ICD-10-CM | POA: Diagnosis not present

## 2020-12-08 DIAGNOSIS — R200 Anesthesia of skin: Secondary | ICD-10-CM | POA: Diagnosis not present

## 2020-12-10 DIAGNOSIS — M6281 Muscle weakness (generalized): Secondary | ICD-10-CM | POA: Diagnosis not present

## 2020-12-10 DIAGNOSIS — R200 Anesthesia of skin: Secondary | ICD-10-CM | POA: Diagnosis not present

## 2020-12-10 DIAGNOSIS — M542 Cervicalgia: Secondary | ICD-10-CM | POA: Diagnosis not present

## 2020-12-11 ENCOUNTER — Other Ambulatory Visit: Payer: Self-pay | Admitting: Nurse Practitioner

## 2020-12-11 ENCOUNTER — Telehealth: Payer: Self-pay

## 2020-12-11 MED ORDER — ONETOUCH ULTRA 2 W/DEVICE KIT: PACK | 0 refills | Status: DC

## 2020-12-11 NOTE — Telephone Encounter (Signed)
OK to write up and we'll sign when we're in

## 2020-12-11 NOTE — Telephone Encounter (Signed)
Patient wants a One Touch Ultra 2 Kit filled through [facility]

## 2020-12-11 NOTE — Telephone Encounter (Signed)
Went ahead and sent this in via her chart.

## 2020-12-11 NOTE — Telephone Encounter (Signed)
Awesome! Thank you!

## 2020-12-15 ENCOUNTER — Telehealth: Payer: Self-pay | Admitting: General Practice

## 2020-12-15 ENCOUNTER — Ambulatory Visit: Payer: Self-pay | Admitting: General Practice

## 2020-12-15 DIAGNOSIS — E1169 Type 2 diabetes mellitus with other specified complication: Secondary | ICD-10-CM

## 2020-12-15 DIAGNOSIS — E1121 Type 2 diabetes mellitus with diabetic nephropathy: Secondary | ICD-10-CM

## 2020-12-15 DIAGNOSIS — E1159 Type 2 diabetes mellitus with other circulatory complications: Secondary | ICD-10-CM

## 2020-12-15 NOTE — Patient Instructions (Signed)
Visit Information  Patient Care Plan: RNCM: Diabetes Type 2 (Adult)    Problem Identified: RNCM: Glycemic Management (Diabetes, Type 2)   Priority: Medium    Goal: RNCM: Glycemic Management Optimized   Priority: Medium  Note:   Objective:  Lab Results  Component Value Date   HGBA1C 6.2 10/27/2020 .   Lab Results  Component Value Date   CREATININE 1.21 (H) 10/27/2020   CREATININE 1.46 (H) 09/16/2020   CREATININE 1.61 (H) 07/21/2020 .   [name] No results found for: EGFR Current Barriers:  [name] Knowledge Deficits related to basic Diabetes pathophysiology and self care/management . Knowledge Deficits related to medications used for management of diabetes . Limited Social Support . Unable to independently manage DM . Lacks social connections . Does not contact provider office for questions/concerns Case Manager Clinical Goal(s):  [name] Collaboration with Venita Lick, NP regarding development and update of comprehensive plan of care as evidenced by provider attestation and co-signature . Inter-disciplinary care team collaboration (see longitudinal plan of care) . Over the next 120 days, patient will demonstrate improved adherence to prescribed treatment plan for diabetes self care/management as evidenced by:  . daily monitoring and recording of CBG  . adherence to ADA/ carb modified diet . adherence to prescribed medication regimen Interventions:  . Provided education to patient about basic DM disease process . Reviewed medications with patient and discussed importance of medication adherence . Discussed plans with patient for ongoing care management follow up and provided patient with direct contact information for care management team . Provided patient with written educational materials related to hypo and hyperglycemia and importance of correct treatment . Reviewed scheduled/upcoming provider appointments including: 02-04-2021 . Advised patient, providing education and rationale, to  check cbg BID and record, calling pcp for findings outside established parameters.   . Review of patient status, including review of consultants reports, relevant laboratory and other test results, and medications completed. Patient Goals/Self-Care Activities . Over the next 120 days, patient will:  - UNABLE to independently DM Self administers oral medications as prescribed Attends all scheduled provider appointments Checks blood sugars as prescribed and utilize hyper and hypoglycemia protocol as needed Adheres to prescribed ADA/carb modified - barriers to adherence to treatment plan identified - blood glucose readings reviewed - individualized medical nutrition therapy provided - mutual A1C goal set or reviewed - resources required to improve adherence to care identified - self-awareness of signs/symptoms of hypo or hyperglycemia encouraged - use of blood glucose monitoring log promoted Follow Up Plan: Telephone follow up appointment with care management team member scheduled for: 02-09-2021 and 0945 am   Task: RNCM: Alleviate Barriers to Glycemic Management   Note:   Care Management Activities:    - barriers to adherence to treatment plan identified - blood glucose readings reviewed - individualized medical nutrition therapy provided - mutual A1C goal set or reviewed - resources required to improve adherence to care identified - self-awareness of signs/symptoms of hypo or hyperglycemia encouraged - use of blood glucose monitoring log promoted       Patient Care Plan: RNCM: Hypertension (Adult)    Problem Identified: RNCM: Hypertension (Hypertension)   Priority: Medium    Goal: RNCM: Hypertension Monitored   Note:   Objective:  . Last practice recorded BP readings:  BP Readings from Last 3 Encounters:  10/27/20 114/74  09/16/20 119/75  07/21/20 112/71 .   [name] Most recent eGFR/CrCl: No results found for: EGFR  No components found for: CRCL Current Barriers:  .  Knowledge  Deficits related to basic understanding of hypertension pathophysiology and self care management . Knowledge Deficits related to understanding of medications prescribed for management of hypertension . Limited Social Support . Unable to independently HTN . Lacks social connections Case Manager Clinical Goal(s):  [name] Over the next 120 days, patient will verbalize understanding of plan for hypertension management . Over the next 120 days, patient will attend all scheduled medical appointments: 02-04-2021 at 0820 am  . Over the next 120 days, patient will demonstrate improved adherence to prescribed treatment plan for hypertension as evidenced by taking all medications as prescribed, monitoring and recording blood pressure as directed, adhering to low sodium/DASH diet . Over the next 120 days, patient will demonstrate improved health management independence as evidenced by checking blood pressure as directed and notifying PCP if SBP>160 or DBP > 90, taking all medications as prescribe, and adhering to a low sodium diet as discussed. . Over the next 120 days, patient will verbalize basic understanding of hypertension disease process and self health management plan as evidenced by compliance with medications, heart healthy/ADA diet, working with the CCM team to optimize health and well being Interventions:  . Collaboration with Venita Lick, NP regarding development and update of comprehensive plan of care as evidenced by provider attestation and co-signature . Inter-disciplinary care team collaboration (see longitudinal plan of care) . Evaluation of current treatment plan related to hypertension self management and patient's adherence to plan as established by provider. . Provided education to patient re: stroke prevention, s/s of heart attack and stroke, DASH diet, complications of uncontrolled blood pressure . Reviewed medications with patient and discussed importance of compliance . Discussed plans  with patient for ongoing care management follow up and provided patient with direct contact information for care management team . Advised patient, providing education and rationale, to monitor blood pressure daily and record, calling PCP for findings outside established parameters.  . Reviewed scheduled/upcoming provider appointments including: 02-04-2021 at 0820 am Patient Goals/Self-Care Activities . Over the next 120 days, patient will:  - Self administers medications as prescribed Attends all scheduled provider appointments Calls provider office for new concerns, questions, or BP outside discussed parameters Checks BP and records as discussed Follows a low sodium diet/DASH diet - blood pressure trends reviewed - depression screen reviewed - home or ambulatory blood pressure monitoring encouraged Follow Up Plan: Telephone follow up appointment with care management team member scheduled for: 02-09-2021 at 0945 am   Task: RNCM: Identify and Monitor Blood Pressure Elevation   Note:   Care Management Activities:    - blood pressure trends reviewed - depression screen reviewed - home or ambulatory blood pressure monitoring encouraged       Patient Care Plan: RNCM: HLD Management    Problem Identified: RNCM: HLD   Priority: Medium    Goal: RNCM: Management of HLD   Priority: Medium  Note:   Current Barriers:  . Poorly controlled hyperlipidemia, complicated by HTN, DM2 . Current antihyperlipidemic regimen: Lipitor 20 mg . Most recent lipid panel:     Component Value Date/Time   CHOL 150 10/27/2020 1124   CHOL 150 05/28/2019 1018   TRIG 97 10/27/2020 1124   TRIG 92 05/28/2019 1018   HDL 59 10/27/2020 1124   CHOLHDL 2.5 07/10/2018 1503   VLDL 18 05/28/2019 1018   LDLCALC 73 10/27/2020 1124 .   [name] ASCVD risk enhancing conditions: age 28, DM, HTN, CKD, former smoker . Unable to independently manage HLD . Lacks  social connections . Does not contact provider office for  questions/concerns  RN Care Manager Clinical Goal(s):  [name] Over the next 120 days, patient will work with Consulting civil engineer, providers, and care team towards execution of optimized self-health management plan . Over the next 120 days, patient will verbalize understanding of plan for effective management of HLD . Over the next 120 days, patient will work with [facility] and CCM team  to address needs related to HLD management  . Over the next 120  days, patient will attend all scheduled medical appointments: 02-04-2021 at 0820 am  Interventions: . Collaboration with Venita Lick, NP regarding development and update of comprehensive plan of care as evidenced by provider attestation and co-signature . Inter-disciplinary care team collaboration (see longitudinal plan of care) . Medication review performed; medication list updated in electronic medical record.  Bertram Savin care team collaboration (see longitudinal plan of care) . Referred to pharmacy team for assistance with HLD medication management . Evaluation of current treatment plan related to HLD and patient's adherence to plan as established by provider. . Advised patient to call the office for changes in condition or questions. Also to write down questions to have for pcp at her next visit.  [name] Provided education to patient re: dietary restrictions and following a heart healthy/ADA diet  . Discussed plans with patient for ongoing care management follow up and provided patient with direct contact information for care management team . Provided patient with DASH educational materials related to heart healthy diet    Patient Goals/Self-Care Activities: . Over the next 120 days, patient will:   - call for medicine refill 2 or 3 days before it runs out - call if I am sick and can't take my medicine - keep a list of all the medicines I take; vitamins and herbals too - learn to read medicine labels - use a pillbox to sort medicine - use an  alarm clock or phone to remind me to take my medicine - change to whole grain breads, cereal, pasta - drink 6 to 8 glasses of water each day - eat 3 to 5 servings of fruits and vegetables each day - eat 5 or 6 small meals each day - fill half the plate with nonstarchy vegetables - limit fast food meals to no more than 1 per week - manage portion size - prepare main meal at home 3 to 5 days each week - reduce red meat to 2 to 3 times a week - set a realistic goal - be open to making changes - I can manage, know and watch for signs of a heart attack - if I have chest pain, call for help - learn about small changes that will make a big difference - learn my personal risk factors   Follow Up Plan: Telephone follow up appointment with care management team member scheduled for: 02-09-2021 at 0945 am       Patient verbalizes understanding of instructions provided today.  Telephone follow up appointment with care management team member scheduled for: 02-09-2021 at [place] am  Noreene Larsson RN, MSN, [facility] Mobile: [phone]   Food Basics for Chronic Kidney Disease Chronic kidney disease (CKD) occurs when the kidneys are permanently damaged over a long period of time. When your kidneys are not working well, they cannot remove waste, fluids, and other substances from your blood as well as they did before. The substances  can build up, which can worsen kidney damage and affect how your body functions. Certain foods lead to a buildup of these substances. By changing your diet, you can help prevent more kidney damage and delay or prevent the need for dialysis. What are tips for following this plan? Reading food labels  Check the amount of salt (sodium) in foods. Choose foods that have less than 300 milligrams (mg) per serving.  Check the ingredient list for phosphorus or potassium-based additives or  preservatives.  Check the amount of saturated fat and trans fat. Limit or avoid these fats as told by your dietitian. Shopping  Avoid buying foods that are: ? Processed or prepackaged. ? Calcium-enriched or that have calcium added to them (are fortified).  Do not buy foods that have salt or sodium listed among the first five ingredients.  Buy canned vegetables and beans that say "no salt added" or "low sodium" and rinse them before eating. Cooking  Soak vegetables, such as potatoes, before cooking to reduce potassium. To do this: 1. Peel and cut the vegetables into small pieces. 2. Soak the vegetables in warm water for at least 2 hours. For every 1 cup of vegetables, use 10 cups of water. 3. Drain and rinse the vegetables with warm water. 4. Boil the vegetables for at least 5 minutes. Meal planning  Limit the amount of protein you eat from plant and animal sources each day.  Do not add salt to food when cooking or before eating.  Eat meals and snacks at around the same time each day. General information  Talk with your health care provider about whether you should take a vitamin and mineral supplement.  Use standard measuring cups and spoons to measure servings of foods. Use a kitchen scale to measure portions of protein foods.  If told by your health care provider, avoid drinking too much fluid. Measure and count all liquids, including water, ice, soups, flavored gelatin, and frozen desserts such as ice pops or ice cream. If you have diabetes:  If you have diabetes (diabetes mellitus) and CKD, it is important to keep your blood sugar (glucose) in the target range recommended by your health care provider. Follow your diabetes management plan. This may include: ? Checking your blood glucose regularly. ? Taking medicines by mouth, taking insulin, or taking both. ? Exercising for at least 30 minutes on 5 or more days each week, or as told by your health care provider. ? Tracking  how many servings of carbohydrates you eat at each meal.  You may be given specific guidelines on how much of certain foods and nutrients you may eat, depending on your stage of kidney disease and whether you have high blood pressure (hypertension). Follow your meal plan as told by your dietitian. What nutrients should I limit? Work with your health care provider and dietitian to develop a meal plan that is right for you. Foods you can eat and foods you should limit or avoid will depend on the stage of your kidney disease and any other health conditions you have. The items listed below are not a complete list. Talk with your dietitian about what dietary choices are best for you. Potassium Potassium affects how steadily your heart beats. If too much potassium builds up in your blood, the potassium can cause an irregular heartbeat or even a heart attack. You may need to limit or avoid foods that are high in potassium, such as:  Milk and soy milk.  Fruits, such as  bananas, apricots, nectarines, melon, prunes, raisins, kiwi, and oranges.  Vegetables, such as potatoes, sweet potatoes, yams, tomatoes, leafy greens, beets, avocado, pumpkin, and winter squash.  White and lima beans.  Whole-wheat breads and pastas.  Beans and nuts. Phosphorus Phosphorus is a mineral found in your bones. A balance between calcium and phosphorus is needed to build and maintain healthy bones. Too much phosphorus pulls calcium from your bones. This can make your bones weak and more likely to break. Too much phosphorus can also make your skin itch. You may need to limit or avoid foods that are high in phosphorus, such as:  Milk and dairy products.  Dried beans and peas.  Tofu, soy milk, and other soy-based meat replacements.  Dark-colored sodas.  Nuts and peanut butter.  Meat, poultry, and fish.  Bran cereals and oatmeal. Protein Protein helps you make and keep muscle. It also helps to repair your body's  cells and tissues. One of the natural breakdown products of protein is a waste product called urea. When your kidneys are not working properly, they cannot remove wastes, such as urea. Reducing how much protein you eat can help prevent a buildup of urea in your blood. Depending on your stage of kidney disease, you may need to limit foods that are high in protein. Sources of animal protein include:  Meat (all types).  Fish and seafood.  Poultry.  Eggs.  Dairy. Other protein foods include:  Beans and legumes.  Nuts and nut butter.  Soy and tofu.   Sodium Sodium helps to maintain a healthy balance of fluids in your body. Too much sodium can increase your blood pressure and have a negative effect on your heart and lungs. Too much sodium can also cause your body to retain too much fluid, making your kidneys work harder. Most people should have less than 2,300 mg of sodium each day. If you have hypertension, you may need to limit your sodium to 1,500 mg each day. You may need to limit or avoid foods that are high in sodium, such as:  Salt seasonings.  Soy sauce.  Cured and processed meats.  Salted crackers and snack foods.  Fast food.  Canned soups and most canned foods.  Pickled foods.  Vegetable juice.  Boxed mixes or ready-to-eat boxed meals and side dishes.  Bottled dressings, sauces, and marinades. Talk with your dietitian about how much potassium, phosphorus, protein, and sodium you may have each day. Summary  Chronic kidney disease (CKD) can lead to a buildup of waste and extra substances in the body. Certain foods lead to a buildup of these substances. By changing your diet as told, you can help prevent more kidney damage and delay or prevent the need for dialysis.  Food intake changes are different for each person with CKD. Work with a dietitian to set up nutrient goals and a meal plan that is right for you.  If you have diabetes and CKD, it is important to keep  your blood sugar in the target range recommended by your health care provider. This information is not intended to replace advice given to you by your health care provider. Make sure you discuss any questions you have with your health care provider. Document Revised: 03/09/2020 Document Reviewed: 03/09/2020 Elsevier Patient Education  2021 [employer].  [email or web address]">  DASH Eating Plan DASH stands for Dietary Approaches to Stop Hypertension. The DASH eating plan is a healthy eating plan that has been shown to:  Reduce high blood pressure (hypertension).  Reduce your risk for type 2 diabetes, heart disease, and stroke.  Help with weight loss. What are tips for following this plan? Reading food labels  Check food labels for the amount of salt (sodium) per serving. Choose foods with less than 5 percent of the Daily Value of sodium. Generally, foods with less than 300 milligrams (mg) of sodium per serving fit into this eating plan.  To find whole grains, look for the word "whole" as the first word in the ingredient list. Shopping  Buy products labeled as "low-sodium" or "no salt added."  Buy fresh foods. Avoid canned foods and pre-made or frozen meals. Cooking  Avoid adding salt when cooking. Use salt-free seasonings or herbs instead of table salt or sea salt. Check with your health care provider or pharmacist before using salt substitutes.  Do not fry foods. Cook foods using healthy methods such as baking, boiling, grilling, roasting, and broiling instead.  Cook with heart-healthy oils, such as olive, canola, avocado, soybean, or sunflower oil. Meal planning  Eat a balanced diet that includes: ? 4 or more servings of fruits and 4 or more servings of vegetables each day. Try to fill one-half of your plate with fruits and vegetables. ? 6-8 servings of whole grains each day. ? Less than 6 oz (170 g) of lean meat, poultry, or fish  each day. A 3-oz (85-g) serving of meat is about the same size as a deck of cards. One egg equals 1 oz (28 g). ? 2-3 servings of low-fat dairy each day. One serving is 1 cup (237 mL). ? 1 serving of nuts, seeds, or beans 5 times each week. ? 2-3 servings of heart-healthy fats. Healthy fats called omega-3 fatty acids are found in foods such as walnuts, flaxseeds, fortified milks, and eggs. These fats are also found in cold-water fish, such as sardines, salmon, and mackerel.  Limit how much you eat of: ? Canned or prepackaged foods. ? Food that is high in trans fat, such as some fried foods. ? Food that is high in saturated fat, such as fatty meat. ? Desserts and other sweets, sugary drinks, and other foods with added sugar. ? Full-fat dairy products.  Do not salt foods before eating.  Do not eat more than 4 egg yolks a week.  Try to eat at least 2 vegetarian meals a week.  Eat more home-cooked food and less restaurant, buffet, and fast food.   Lifestyle  When eating at a restaurant, ask that your food be prepared with less salt or no salt, if possible.  If you drink alcohol: ? Limit how much you use to:  0-1 drink a day for women who are not pregnant.  0-2 drinks a day for men. ? Be aware of how much alcohol is in your drink. In the [place] one drink equals one 12 oz bottle of beer (355 mL), one 5 oz glass of wine (148 mL), or one 1 oz glass of hard liquor (44 mL). General information  Avoid eating more than 2,300 mg of salt a day. If you have hypertension, you may need to reduce your sodium intake to 1,500 mg a day.  Work with your health care provider to maintain a healthy body weight or to lose weight. Ask what an ideal weight is for you.  Get at least 30 minutes of exercise that causes your heart to beat faster (aerobic exercise) most days of the week. Activities may include walking, swimming, or biking.  Work with your  health care provider or dietitian to adjust your eating  plan to your individual calorie needs. What foods should I eat? Fruits All fresh, dried, or frozen fruit. Canned fruit in natural juice (without added sugar). Vegetables Fresh or frozen vegetables (raw, steamed, roasted, or grilled). Low-sodium or reduced-sodium tomato and vegetable juice. Low-sodium or reduced-sodium tomato sauce and tomato paste. Low-sodium or reduced-sodium canned vegetables. Grains Whole-grain or whole-wheat bread. Whole-grain or whole-wheat pasta. Brown rice. [place]. Bulgur. Whole-grain and low-sodium cereals. Pita bread. Low-fat, low-sodium crackers. Whole-wheat flour tortillas. Meats and other proteins Skinless chicken or [place]. Ground chicken or [place]. Pork with fat trimmed off. Fish and seafood. Egg whites. Dried beans, peas, or lentils. Unsalted nuts, nut butters, and seeds. Unsalted canned beans. Lean cuts of beef with fat trimmed off. Low-sodium, lean precooked or cured meat, such as sausages or meat loaves. Dairy Low-fat (1%) or fat-free (skim) milk. Reduced-fat, low-fat, or fat-free cheeses. Nonfat, low-sodium ricotta or cottage cheese. Low-fat or nonfat yogurt. Low-fat, low-sodium cheese. Fats and oils Soft margarine without trans fats. Vegetable oil. Reduced-fat, low-fat, or light mayonnaise and salad dressings (reduced-sodium). Canola, safflower, olive, avocado, soybean, and sunflower oils. Avocado. Seasonings and condiments Herbs. Spices. Seasoning mixes without salt. Other foods Unsalted popcorn and pretzels. Fat-free sweets. The items listed above may not be a complete list of foods and beverages you can eat. Contact a dietitian for more information. What foods should I avoid? Fruits Canned fruit in a light or heavy syrup. Fried fruit. Fruit in cream or butter sauce. Vegetables Creamed or fried vegetables. Vegetables in a cheese sauce. Regular canned vegetables (not low-sodium or reduced-sodium). Regular canned tomato sauce and paste (not  low-sodium or reduced-sodium). Regular tomato and vegetable juice (not low-sodium or reduced-sodium). Angie Fava. Olives. Grains Baked goods made with fat, such as croissants, muffins, or some breads. Dry pasta or rice meal packs. Meats and other proteins Fatty cuts of meat. Ribs. Fried meat. Berniece Salines. Bologna, salami, and other precooked or cured meats, such as sausages or meat loaves. Fat from the back of a pig (fatback). Bratwurst. Salted nuts and seeds. Canned beans with added salt. Canned or smoked fish. Whole eggs or egg yolks. Chicken or [place] with skin. Dairy Whole or 2% milk, cream, and half-and-half. Whole or full-fat cream cheese. Whole-fat or sweetened yogurt. Full-fat cheese. Nondairy creamers. Whipped toppings. Processed cheese and cheese spreads. Fats and oils Butter. Stick margarine. Lard. Shortening. Ghee. Bacon fat. Tropical oils, such as coconut, palm kernel, or palm oil. Seasonings and condiments Onion salt, garlic salt, seasoned salt, table salt, and sea salt. Worcestershire sauce. Tartar sauce. Barbecue sauce. Teriyaki sauce. Soy sauce, including reduced-sodium. Steak sauce. Canned and packaged gravies. Fish sauce. Oyster sauce. Cocktail sauce. Store-bought horseradish. Ketchup. Mustard. Meat flavorings and tenderizers. Bouillon cubes. Hot sauces. Pre-made or packaged marinades. Pre-made or packaged taco seasonings. Relishes. Regular salad dressings. Other foods Salted popcorn and pretzels. The items listed above may not be a complete list of foods and beverages you should avoid. Contact a dietitian for more information. Where to find more information  National Heart, Lung, and Blood Institute: https://wilson-eaton.com/  American Heart Association: www.heart.org  Academy of Nutrition and Dietetics: www.eatright.[place]: www.kidney.org Summary  The DASH eating plan is a healthy eating plan that has been shown to reduce high blood pressure (hypertension). It  may also reduce your risk for type 2 diabetes, heart disease, and stroke.  When on the DASH eating plan, aim to eat more fresh fruits and  vegetables, whole grains, lean proteins, low-fat dairy, and heart-healthy fats.  With the DASH eating plan, you should limit salt (sodium) intake to 2,300 mg a day. If you have hypertension, you may need to reduce your sodium intake to 1,500 mg a day.  Work with your health care provider or dietitian to adjust your eating plan to your individual calorie needs. This information is not intended to replace advice given to you by your health care provider. Make sure you discuss any questions you have with your health care provider. Document Revised: 10/18/2019 Document Reviewed: 10/18/2019 Elsevier Patient Education  2021 [employer].

## 2020-12-15 NOTE — Chronic Care Management (AMB) (Signed)
Chronic Care Management   CCM RN Visit Note  12/15/2020 Name: Kim Thomas MRN: [id number] DOB: 1956-10-21  Subjective: Kim Thomas is a 65 y.o. year old female who is a primary care patient of Cannady, [name], NP. The care management team was consulted for assistance with disease management and care coordination needs.    Engaged with patient by telephone for follow up visit in response to provider referral for case management and/or care coordination services.   Consent to Services:  Currrently engaged with CCM services   Patient agreed to services and verbal consent obtained.   Assessment: Review of patient past medical history, allergies, medications, health status, including review of consultants reports, laboratory and other test data, was performed as part of comprehensive evaluation and provision of chronic care management services.   SDOH (Social Determinants of Health) assessments and interventions performed:    CCM Care Plan  Allergies  Allergen Reactions   Morphine And Related Itching    It burned, and made patient itch and go out of her mind.   Erythromycin     "Been so long ago," patient unable to remember reaction   Hydrocodone-Acetaminophen Itching    Outpatient Encounter Medications as of 12/15/2020  Medication Sig Note   acetaminophen (TYLENOL) 500 MG tablet Take 500 mg by mouth daily as needed. 07/13/2020: Taking one tablet twice a day.   ascorbic acid (VITAMIN C) 1000 MG tablet Take by mouth.    Aspirin (VAZALORE) 81 MG CAPS Take by mouth.    atorvastatin (LIPITOR) 20 MG tablet TAKE 1 TABLET BY MOUTH  DAILY    BLACK CURRANT SEED OIL PO Take 1,250 mg by mouth 2 (two) times daily.    Blood Glucose Monitoring Suppl (ONE TOUCH ULTRA 2) w/Device KIT Use to check blood sugar 3-4 times daily and document for visits. ICD E11.21    CALCIUM CITRATE PO Take 1,000 mg by mouth daily.    Cholecalciferol (VITAMIN D3) 2000 units TABS Take 2,000 Units by mouth  daily.    Co-Enzyme Q-10 100 MG CAPS Take 50 mg by mouth daily.    cyclobenzaprine (FLEXERIL) 10 MG tablet Take 10 mg by mouth at bedtime. Take 1/2 tablet to 1 as needed  At bed time    diclofenac sodium (VOLTAREN) 1 % GEL Apply topically.    EQ ALLERGY RELIEF, CETIRIZINE, 10 MG tablet Take 1 tablet by mouth once daily    fluticasone (FLONASE) 50 MCG/ACT nasal spray USE 1 TO 2 SPRAY(S) IN EACH NOSTRIL TWICE DAILY    gabapentin (NEURONTIN) 400 MG capsule TAKE 1 CAPSULE BY MOUTH 3  TIMES DAILY    glipiZIDE (GLUCOTROL) 5 MG tablet Take 0.5 tablets (2.5 mg total) by mouth daily before breakfast.    JARDIANCE 25 MG TABS tablet TAKE 1 TABLET BY MOUTH  DAILY    Lancets (ONETOUCH DELICA PLUS [id number]) MISC 1 each by Does not apply route 2 (two) times daily.    Magnesium 400 MG TABS Take by mouth.    Multiple Vitamin (MULTIVITAMIN) tablet Take 1 tablet by mouth daily.    Olopatadine HCl (PATADAY OP) Apply 1 drop to eye daily.    Omega-3 1000 MG CAPS Take 1 capsule by mouth daily.    omeprazole (PRILOSEC) 20 MG capsule TAKE 1 CAPSULE BY MOUTH  DAILY    ONETOUCH ULTRA test strip USE TO CHECK BLOOD SUGAR  DAILY    Potassium 99 MG TABS Take by mouth.    telmisartan (MICARDIS) 40 MG tablet Take  1 tablet (40 mg total) by mouth daily.    triamcinolone cream (KENALOG) 0.1 % Apply 1 application topically 2 (two) times daily.    vitamin E 100 UNIT capsule Take 100 Units by mouth daily.    No facility-administered encounter medications on file as of 12/15/2020.    Patient Active Problem List   Diagnosis Date Noted   Obesity 07/17/2020   Osteopenia 03/04/2020   Multinodular thyroid 01/02/2020   Vitamin D deficiency 12/03/2019   Hyperlipidemia associated with type 2 diabetes mellitus ([place]) 04/03/2018   Advanced care planning/counseling discussion 02/20/2017   Elevated uric acid in blood 06/21/2016   Allergic rhinitis 06/06/2016   CKD stage 3 due to type 2 diabetes mellitus  ([place]) 06/06/2016   Type 2 diabetes mellitus with diabetic nephropathy ([place]) 05/25/2016   Hypertension associated with diabetes ([place]) 05/25/2016   Arthritis of hand, right 05/25/2016   Sleep apnea 05/25/2016   Hammertoe 01/01/2016   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    Conditions to be addressed/monitored:HTN, HLD and DMII  Care Plan : RNCM: Diabetes Type 2 (Adult)  Updates made by Vanita Ingles since 12/15/2020 12:00 AM    Problem: RNCM: Glycemic Management (Diabetes, Type 2)   Priority: Medium    Goal: RNCM: Glycemic Management Optimized   Priority: Medium  Note:   Objective:  Lab Results  Component Value Date   HGBA1C 6.2 10/27/2020    Lab Results  Component Value Date   CREATININE 1.21 (H) 10/27/2020   CREATININE 1.46 (H) 09/16/2020   CREATININE 1.61 (H) 07/21/2020     No results found for: EGFR Current Barriers:   Knowledge Deficits related to basic Diabetes pathophysiology and self care/management  Knowledge Deficits related to medications used for management of diabetes  Limited Social Support  Unable to independently manage DM  Lacks social connections  Does not contact provider office for questions/concerns Case Manager Clinical Goal(s):   Collaboration with Marnee Guarneri T, NP regarding development and update of comprehensive plan of care as evidenced by provider attestation and co-signature  Inter-disciplinary care team collaboration (see longitudinal plan of care)  Over the next 120 days, patient will demonstrate improved adherence to prescribed treatment plan for diabetes self care/management as evidenced by:   daily monitoring and recording of CBG   adherence to ADA/ carb modified diet  adherence to prescribed medication regimen Interventions:   Provided education to patient about basic DM disease process  Reviewed medications with patient and discussed importance of medication adherence  Discussed plans with patient for ongoing care  management follow up and provided patient with direct contact information for care management team  Provided patient with written educational materials related to hypo and hyperglycemia and importance of correct treatment  Reviewed scheduled/upcoming provider appointments including: 02-04-2021  Advised patient, providing education and rationale, to check cbg BID and record, calling pcp for findings outside established parameters.    Review of patient status, including review of consultants reports, relevant laboratory and other test results, and medications completed. Patient Goals/Self-Care Activities  Over the next 120 days, patient will:  - UNABLE to independently DM Self administers oral medications as prescribed Attends all scheduled provider appointments Checks blood sugars as prescribed and utilize hyper and hypoglycemia protocol as needed Adheres to prescribed ADA/carb modified - barriers to adherence to treatment plan identified - blood glucose readings reviewed - individualized medical nutrition therapy provided - mutual A1C goal set or reviewed - resources required to improve adherence to care identified - self-awareness of signs/symptoms  of hypo or hyperglycemia encouraged - use of blood glucose monitoring log promoted Follow Up Plan: Telephone follow up appointment with care management team member scheduled for: 02-09-2021 and 0945 am   Task: RNCM: Alleviate Barriers to Glycemic Management   Note:   Care Management Activities:    - barriers to adherence to treatment plan identified - blood glucose readings reviewed - individualized medical nutrition therapy provided - mutual A1C goal set or reviewed - resources required to improve adherence to care identified - self-awareness of signs/symptoms of hypo or hyperglycemia encouraged - use of blood glucose monitoring log promoted       Care Plan : RNCM: Hypertension (Adult)  Updates made by Vanita Ingles since  12/15/2020 12:00 AM    Problem: RNCM: Hypertension (Hypertension)   Priority: Medium    Goal: RNCM: Hypertension Monitored   Note:   Objective:   Last practice recorded BP readings:  BP Readings from Last 3 Encounters:  10/27/20 114/74  09/16/20 119/75  07/21/20 112/71     Most recent eGFR/CrCl: No results found for: EGFR  No components found for: CRCL Current Barriers:   Knowledge Deficits related to basic understanding of hypertension pathophysiology and self care management  Knowledge Deficits related to understanding of medications prescribed for management of hypertension  Limited Social Support  Unable to independently HTN  Lacks social connections Case Manager Clinical Goal(s):   Over the next 120 days, patient will verbalize understanding of plan for hypertension management  Over the next 120 days, patient will attend all scheduled medical appointments: 02-04-2021 at 0820 am   Over the next 120 days, patient will demonstrate improved adherence to prescribed treatment plan for hypertension as evidenced by taking all medications as prescribed, monitoring and recording blood pressure as directed, adhering to low sodium/DASH diet  Over the next 120 days, patient will demonstrate improved health management independence as evidenced by checking blood pressure as directed and notifying PCP if SBP>160 or DBP > 90, taking all medications as prescribe, and adhering to a low sodium diet as discussed.  Over the next 120 days, patient will verbalize basic understanding of hypertension disease process and self health management plan as evidenced by compliance with medications, heart healthy/ADA diet, working with the CCM team to optimize health and well being Interventions:   Collaboration with Venita Lick, NP regarding development and update of comprehensive plan of care as evidenced by provider attestation and co-signature  Inter-disciplinary care team collaboration (see  longitudinal plan of care)  Evaluation of current treatment plan related to hypertension self management and patient's adherence to plan as established by provider.  Provided education to patient re: stroke prevention, s/s of heart attack and stroke, DASH diet, complications of uncontrolled blood pressure  Reviewed medications with patient and discussed importance of compliance  Discussed plans with patient for ongoing care management follow up and provided patient with direct contact information for care management team  Advised patient, providing education and rationale, to monitor blood pressure daily and record, calling PCP for findings outside established parameters.   Reviewed scheduled/upcoming provider appointments including: 02-04-2021 at 0820 am Patient Goals/Self-Care Activities  Over the next 120 days, patient will:  - Self administers medications as prescribed Attends all scheduled provider appointments Calls provider office for new concerns, questions, or BP outside discussed parameters Checks BP and records as discussed Follows a low sodium diet/DASH diet - blood pressure trends reviewed - depression screen reviewed - home or ambulatory blood pressure monitoring encouraged Follow  Up Plan: Telephone follow up appointment with care management team member scheduled for: 02-09-2021 at 0945 am   Task: RNCM: Identify and Monitor Blood Pressure Elevation   Note:   Care Management Activities:    - blood pressure trends reviewed - depression screen reviewed - home or ambulatory blood pressure monitoring encouraged       Care Plan : RNCM: HLD Management  Updates made by Vanita Ingles since 12/15/2020 12:00 AM    Problem: RNCM: HLD   Priority: Medium    Goal: RNCM: Management of HLD   Priority: Medium  Note:   Current Barriers:   Poorly controlled hyperlipidemia, complicated by HTN, DM2  Current antihyperlipidemic regimen: Lipitor 20 mg  Most recent lipid panel:      Component Value Date/Time   CHOL 150 10/27/2020 1124   CHOL 150 05/28/2019 1018   TRIG 97 10/27/2020 1124   TRIG 92 05/28/2019 1018   HDL 59 10/27/2020 1124   CHOLHDL 2.5 07/10/2018 1503   VLDL 18 05/28/2019 1018   [place] 73 10/27/2020 1124     ASCVD risk enhancing conditions: age 47, DM, HTN, CKD, former smoker  Unable to independently manage HLD  Lacks social connections  Does not contact provider office for questions/concerns  RN Care Manager Clinical Goal(s):   Over the next 120 days, patient will work with Consulting civil engineer, providers, and care team towards execution of optimized self-health management plan  Over the next 120 days, patient will verbalize understanding of plan for effective management of HLD  Over the next 120 days, patient will work with [facility] and CCM team  to address needs related to HLD management   Over the next 120  days, patient will attend all scheduled medical appointments: 02-04-2021 at 0820 am  Interventions:  Collaboration with Venita Lick, NP regarding development and update of comprehensive plan of care as evidenced by provider attestation and co-signature  Inter-disciplinary care team collaboration (see longitudinal plan of care)  Medication review performed; medication list updated in electronic medical record.   Inter-disciplinary care team collaboration (see longitudinal plan of care)  Referred to pharmacy team for assistance with HLD medication management  Evaluation of current treatment plan related to HLD and patient's adherence to plan as established by provider.  Advised patient to call the office for changes in condition or questions. Also to write down questions to have for pcp at her next visit.   Provided education to patient re: dietary restrictions and following a heart healthy/ADA diet   Discussed plans with patient for ongoing care management follow up and provided patient with direct contact information for  care management team  Provided patient with DASH educational materials related to heart healthy diet    Patient Goals/Self-Care Activities:  Over the next 120 days, patient will:   - call for medicine refill 2 or 3 days before it runs out - call if I am sick and can't take my medicine - keep a list of all the medicines I take; vitamins and herbals too - learn to read medicine labels - use a pillbox to sort medicine - use an alarm clock or phone to remind me to take my medicine - change to whole grain breads, cereal, pasta - drink 6 to 8 glasses of water each day - eat 3 to 5 servings of fruits and vegetables each day - eat 5 or 6 small meals each day - fill half the plate with nonstarchy vegetables - limit fast food  meals to no more than 1 per week - manage portion size - prepare main meal at home 3 to 5 days each week - reduce red meat to 2 to 3 times a week - set a realistic goal - be open to making changes - I can manage, know and watch for signs of a heart attack - if I have chest pain, call for help - learn about small changes that will make a big difference - learn my personal risk factors   Follow Up Plan: Telephone follow up appointment with care management team member scheduled for: 02-09-2021 at 0945 am       Plan:Telephone follow up appointment with care management team member scheduled for:  02-09-2021 at [place] am   Noreene Larsson RN, MSN, [facility] Mobile: [phone]

## 2021-01-08 ENCOUNTER — Other Ambulatory Visit: Payer: Self-pay | Admitting: Nurse Practitioner

## 2021-01-08 DIAGNOSIS — E042 Nontoxic multinodular goiter: Secondary | ICD-10-CM

## 2021-01-09 ENCOUNTER — Other Ambulatory Visit: Payer: Self-pay | Admitting: Nurse Practitioner

## 2021-01-10 ENCOUNTER — Other Ambulatory Visit: Payer: Self-pay | Admitting: Nurse Practitioner

## 2021-01-14 ENCOUNTER — Ambulatory Visit: Payer: Self-pay | Admitting: *Deleted

## 2021-01-14 NOTE — Telephone Encounter (Signed)
Is okay for patient to take her current regimen -- including Flonase and her allergy oral medication.  I also recommend Coricidin over the counter, which will not elevated her BP or effect her other disease processes and if cough present then diabetic tussin which will not elevate sugars.  If worsening though I recommend a visit virtually.

## 2021-01-14 NOTE — Telephone Encounter (Signed)
Pt has thyroid ultrasound sch for Monday and she was using peppermint oil fragrance and now her head is congested and having drainage x 2 days. Pt would like to know if she can try alka seltzer plus or what OTC medication . Pt does not want to take just any medication  due to her varies diagnosis Patient requesting recommendations to get rid of nasal stuffiness and congestion she feels is due to using a peppermint oil fragrance in her home yesterday. After using the fragrance she noticed she had nasal stuffiness and congestion going on 2 days at 4 pm since using peppermint oil.  Patient reported she has coughed a few times but feels it is due to nasal drainage. Denies headache, dry cough, sore throat, fever, body aches, sneezing. Patient is scheduled for thyroid US Monday and is afraid she will have to cancel it if she continues to have nasal congestion. Requesting what medication is safe for her to take to get rid of nasal stuffiness by tomorrow. Patient reports she is a diabetic, kidney issues and HTN. Instructed patient to contact pharmacist for OTC recommendations. Patient has been using prescribed medications , nasal spray, flonase and allergy tab EQ allergy. Please advise treatment for patient before tomorrow. Care advise given. Patient verbalized understanding of care advise and to call back if symptoms worsen.   Reason for Disposition . Causes of nasal allergies (allergens), questions about  Answer Assessment - Initial Assessment Questions 1. SYMPTOM: "What's the main symptom you're concerned about?" (e.g., runny nose, stuffiness, sneezing, itching)     Nasal congestion, stuffiness, drainage down back of throat 2. SEVERITY: "How bad is it?" "What does it keep you from doing?" (e.g., sleeping, working)      Not bad but would like to get rid of it  3. EYES: "Are the eyes also red, watery, and itchy?"      Denies any other symptoms 4. TRIGGER: "What pollen or other allergic substance do you think  is causing the symptoms?"      Pt feels symptoms started after using a peppermint oil fragrance in her house and after using she started having symptoms 5. TREATMENT: "What medicine are you using?" "What medicine worked best in the past?"     Prescribed allergy medication, nasal spray and a nasal wash 6. OTHER SYMPTOMS: "Do you have any other symptoms?" (e.g., coughing, difficulty breathing, wheezing)     denies 7. PREGNANCY: "Is there any chance you are pregnant?" "When was your last menstrual period?"     na  Protocols used: NASAL ALLERGIES (HAY FEVER)-A-AH

## 2021-01-15 ENCOUNTER — Telehealth: Payer: Medicare Other | Admitting: Nurse Practitioner

## 2021-01-15 NOTE — Telephone Encounter (Signed)
Patient notified of Jolene's message.   °

## 2021-01-18 ENCOUNTER — Ambulatory Visit: Payer: Medicare Other

## 2021-01-19 ENCOUNTER — Telehealth: Payer: Self-pay

## 2021-01-19 NOTE — Telephone Encounter (Signed)
Called pt scheduled virtually for Thursday since she didn't want to see anyone else   Copied from CRM [id number]. Topic: General - Other >> Jan 19, 2021 12:10 PM Gaetana Michaelis A wrote: Reason for CRM: Patient is experiencing sinus discomfort and irritation, beginning roughly a week ago. Patient's biggest concern is the congestion. Patient has no fever, body aches or chills. Patient would like to be advised about solutions to the discomfort and congestion. Patient declined to make an appt at the time of call with agent.

## 2021-01-19 NOTE — Telephone Encounter (Signed)
Noted  

## 2021-01-20 ENCOUNTER — Other Ambulatory Visit: Payer: Self-pay | Admitting: Nurse Practitioner

## 2021-01-21 ENCOUNTER — Encounter: Payer: Self-pay | Admitting: Nurse Practitioner

## 2021-01-21 ENCOUNTER — Telehealth (INDEPENDENT_AMBULATORY_CARE_PROVIDER_SITE_OTHER): Payer: Medicare Other | Admitting: Nurse Practitioner

## 2021-01-21 DIAGNOSIS — J3489 Other specified disorders of nose and nasal sinuses: Secondary | ICD-10-CM | POA: Diagnosis not present

## 2021-01-21 MED ORDER — AMOXICILLIN-POT CLAVULANATE 875-125 MG PO TABS: 1.0000 | ORAL_TABLET | Freq: Two times a day (BID) | ORAL | 0 refills | Status: AC

## 2021-01-21 NOTE — Progress Notes (Signed)
LMP  (LMP Unknown)    Subjective:    Patient ID: Kim Thomas, female    DOB: 07-21-1956, 65 y.o.   MRN: [id number]  HPI: Kim Thomas is a 65 y.o. female  Chief Complaint  Patient presents with  . Sinus Problem    Patient states she became symptomatic around the 01/12/21. Patient denies being around anyone known diagnosed or exposed. Pt complains of congestion and sinus are draining and states she needs something to dry it up and says as days has passed she noticed some discoloration of a greenish tint. Great relief is in the morning of releasing some of the mucus.  . Nasal Congestion    . This visit was completed via telephone due to the restrictions of the COVID-19 pandemic. All issues as above were discussed and addressed but no physical exam was performed. If it was felt that the patient should be evaluated in the office, they were directed there. The patient verbally consented to this visit. Patient was unable to complete an audio/visual visit due to Technical difficulties, Lack of internet. Due to the catastrophic nature of the COVID-19 pandemic, this visit was done through audio contact only. . Location of the patient: home . Location of the provider: work . Those involved with this call:  . Provider: Aura Dials, DNP . CMA: Malen Gauze, CMA . Front Desk/Registration: Harriet Pho  . Time spent on call: 21 minutes on the phone discussing health concerns. 15 minutes total spent in review of patient's record and preparation of their chart.  . I verified patient identity using two factors (patient name and date of birth). Patient consents verbally to being seen via telemedicine visit today.    UPPER RESPIRATORY TRACT INFECTION Started with symptoms on 01/12/21.  Reports sinus headache and sinus drainage.  Doing nasal wash and rinse twice a day.  Reports the drainage has gone from clear to now greenish.  Reports she did not test for Covid -- has test at home. Has been using  Allegra and Flonase.  Is Covid vaccinated x 2. Fever: no Cough: yes a mild one Shortness of breath: no Wheezing: no Chest pain: no Chest tightness: no Chest congestion: no Nasal congestion: yes Runny nose: no Post nasal drip: no Sneezing: no Sore throat: no Swollen glands: no Sinus pressure: yes Headache: yes Face pain: yes Toothache: no Ear pain: none Ear pressure: none Eyes red/itching:no Eye drainage/crusting: no  Vomiting: no Rash: no Fatigue: yes Sick contacts: no Strep contacts: no  Context: fluctuating Recurrent sinusitis: no Relief with OTC cold/cough medications: no  Treatments attempted: pepermint oil and flushes, cold/sinus and anti-histamine   Relevant past medical, surgical, family and social history reviewed and updated as indicated. Interim medical history since our last visit reviewed. Allergies and medications reviewed and updated.  Review of Systems  Constitutional: Negative.   HENT: Positive for congestion, sinus pressure and sinus pain. Negative for ear discharge, ear pain, facial swelling, postnasal drip, rhinorrhea, sneezing, sore throat and voice change.   Eyes: Negative.   Respiratory: Positive for cough. Negative for chest tightness, shortness of breath and wheezing.   Cardiovascular: Negative.   Gastrointestinal: Negative.   Musculoskeletal: Negative for myalgias.  Neurological: Positive for headaches. Negative for dizziness and numbness.  Psychiatric/Behavioral: Negative.     Per HPI unless specifically indicated above     Objective:    LMP  (LMP Unknown)   Wt Readings from Last 3 Encounters:  10/27/20 208 lb 9.6 oz (94.6 kg)  09/16/20 206 lb (93.4 kg)  08/14/20 198 lb (89.8 kg)    Physical Exam   Unable to perform due to telephone visit only.  Results for orders placed or performed in visit on 10/27/20  Bayer DCA Hb A1c Waived  Result Value Ref Range   HB A1C (BAYER DCA - WAIVED) 6.2 <7.0 %  Basic metabolic panel  Result  Value Ref Range   Glucose 118 (H) 65 - 99 mg/dL   BUN 19 8 - 27 mg/dL   Creatinine, Ser 2.45 (H) 0.57 - 1.00 mg/dL   GFR calc non Af Amer 47 (L) >59 mL/min/1.73   GFR calc Af Amer 55 (L) >59 mL/min/1.73   BUN/Creatinine Ratio 16 12 - 28   Sodium 140 134 - 144 mmol/L   Potassium 4.9 3.5 - 5.2 mmol/L   Chloride 104 96 - 106 mmol/L   CO2 22 20 - 29 mmol/L   Calcium 9.7 8.7 - 10.3 mg/dL  Lipid Panel w/o Chol/HDL Ratio  Result Value Ref Range   Cholesterol, Total 150 100 - 199 mg/dL   Triglycerides 97 0 - 149 mg/dL   HDL 59 >80 mg/dL   VLDL Cholesterol Cal 18 5 - 40 mg/dL   LDL Chol Calc (NIH) 73 0 - 99 mg/dL      Assessment & Plan:   Problem List Items Addressed This Visit      Other   Sinus pressure - Primary    Ongoing x 9 days, mainly under eyes.  Recommend she utilize one of her home Covid tests, since she does not wish to leave house for testing.  Alert provider to results.  Continue at home Allegra and Flonase daily.  Will send in abx, Augmentin, for 10 days treatment due to current sinusitis with symptoms > 7 days.  Recommend: - Increased rest - Increasing Fluids - Acetaminophen as needed for fever/pain. - Mucinex.  - Saline sinus flushes or a neti pot.  - Humidifying the air Return to office for worsening or ongoing.         I discussed the assessment and treatment plan with the patient. The patient was provided an opportunity to ask questions and all were answered. The patient agreed with the plan and demonstrated an understanding of the instructions.   The patient was advised to call back or seek an in-person evaluation if the symptoms worsen or if the condition fails to improve as anticipated.   I provided 21+ minutes of time during this encounter.  Follow up plan: Return if symptoms worsen or fail to improve.

## 2021-01-21 NOTE — Patient Instructions (Signed)

## 2021-01-21 NOTE — Assessment & Plan Note (Signed)
Ongoing x 9 days, mainly under eyes.  Recommend she utilize one of her home Covid tests, since she does not wish to leave house for testing.  Alert provider to results.  Continue at home Allegra and Flonase daily.  Will send in abx, Augmentin, for 10 days treatment due to current sinusitis with symptoms > 7 days.  Recommend: - Increased rest - Increasing Fluids - Acetaminophen as needed for fever/pain. - Mucinex.  - Saline sinus flushes or a neti pot.  - Humidifying the air Return to office for worsening or ongoing.

## 2021-01-29 ENCOUNTER — Encounter: Payer: Self-pay | Admitting: Nurse Practitioner

## 2021-02-01 ENCOUNTER — Ambulatory Visit
Admission: RE | Admit: 2021-02-01 | Discharge: 2021-02-01 | Disposition: A | Discharge status: Home / Self Care | Payer: Medicare Other | Source: Ambulatory Visit | Attending: Nurse Practitioner | Admitting: Nurse Practitioner

## 2021-02-01 ENCOUNTER — Other Ambulatory Visit: Payer: Self-pay

## 2021-02-01 DIAGNOSIS — E042 Nontoxic multinodular goiter: Secondary | ICD-10-CM | POA: Insufficient documentation

## 2021-02-02 NOTE — Progress Notes (Signed)
Contacted via MyChart   Good evening Lateya, your thyroid level has improved and is showing improvement in has nodule size, although thyroid remains enlarged and for this we can continue to monitor blood work.  However, the imaging report is showing improvement and current findings do not meet criteria for further evaluation via imaging unless you have change in symptoms.  Any questions? Keep being awesome!!  Thank you for allowing me to participate in your care. Kindest regards, Dempsey Knotek

## 2021-02-04 ENCOUNTER — Ambulatory Visit (INDEPENDENT_AMBULATORY_CARE_PROVIDER_SITE_OTHER): Payer: Medicare Other | Admitting: Nurse Practitioner

## 2021-02-04 ENCOUNTER — Other Ambulatory Visit: Payer: Self-pay

## 2021-02-04 ENCOUNTER — Encounter: Payer: Self-pay | Admitting: Nurse Practitioner

## 2021-02-04 VITALS — BP 123/80 | HR 94 | Temp 98.2°F | Wt 210.0 lb

## 2021-02-04 DIAGNOSIS — I152 Hypertension secondary to endocrine disorders: Secondary | ICD-10-CM

## 2021-02-04 DIAGNOSIS — E1159 Type 2 diabetes mellitus with other circulatory complications: Secondary | ICD-10-CM

## 2021-02-04 DIAGNOSIS — E1169 Type 2 diabetes mellitus with other specified complication: Secondary | ICD-10-CM

## 2021-02-04 DIAGNOSIS — E785 Hyperlipidemia, unspecified: Secondary | ICD-10-CM | POA: Diagnosis not present

## 2021-02-04 DIAGNOSIS — M85852 Other specified disorders of bone density and structure, left thigh: Secondary | ICD-10-CM | POA: Diagnosis not present

## 2021-02-04 DIAGNOSIS — G4733 Obstructive sleep apnea (adult) (pediatric): Secondary | ICD-10-CM | POA: Diagnosis not present

## 2021-02-04 DIAGNOSIS — E1121 Type 2 diabetes mellitus with diabetic nephropathy: Secondary | ICD-10-CM | POA: Diagnosis not present

## 2021-02-04 DIAGNOSIS — E559 Vitamin D deficiency, unspecified: Secondary | ICD-10-CM | POA: Diagnosis not present

## 2021-02-04 DIAGNOSIS — N183 Chronic kidney disease, stage 3 unspecified: Secondary | ICD-10-CM

## 2021-02-04 DIAGNOSIS — E1122 Type 2 diabetes mellitus with diabetic chronic kidney disease: Secondary | ICD-10-CM | POA: Diagnosis not present

## 2021-02-04 LAB — MICROALBUMIN, URINE WAIVED
Creatinine, Urine Waived: 50 mg/dL (ref 10–300)
Microalb, Ur Waived: 10 mg/L (ref 0–19)
Microalb/Creat Ratio: <30 mg/g (ref <30)

## 2021-02-04 LAB — BAYER DCA HB A1C WAIVED: HB A1C (BAYER DCA - WAIVED): 6.6 % (ref <7.0)

## 2021-02-04 NOTE — Assessment & Plan Note (Signed)
Noted on recent DEXA -- continue supplements daily and check Vit D level.  Plan for repeat DEXA in 2026.  Discussed fall prevention.  Vit D level next visit.

## 2021-02-04 NOTE — Assessment & Plan Note (Signed)
Chronic, continue regular (100%) use of CPAP.

## 2021-02-04 NOTE — Assessment & Plan Note (Signed)
Chronic, ongoing.  Continue current medication regimen and adjust as needed. Lipid panel today. 

## 2021-02-04 NOTE — Progress Notes (Addendum)
BP 123/80   Pulse 94   Temp 98.2 F (36.8 C) (Oral)   Wt 210 lb (95.3 kg)   LMP  (LMP Unknown)   SpO2 97%   BMI 36.05 kg/m    Subjective:    Patient ID: Kim Thomas, female    DOB: Aug 16, 1956, 65 y.o.   MRN: [id number]  HPI: Kim Thomas is a 65 y.o. female  Chief Complaint  Patient presents with  . Diabetes    Patient denies having problems or concerns at today's visit.   [name] Hypertension   DIABETES Continues on Glipizide 2.5 MG (taking 3-4 times a week, not daily) and Jardiance 25 MG daily. August A1C 6.2%.Her goal is to come completely off medication at some point.  Does endorse enjoying wheat crackers and chips at times.  Would like something that could increase her metabolism.   Hypoglycemic episodes:no Polydipsia/polyuria: no Visual disturbance: no Chest pain: no Paresthesias: no Glucose Monitoring: yes             Accucheck frequency: occasionally             Fasting glucose: 110 - 120 -- mainly <130             Post prandial:             Evening:              Before meals:  Taking Insulin?: no             Long acting insulin:             Short acting insulin: Blood Pressure Monitoring: not checking Retinal Examination: Not Up to Date -- is working on getting scheduled -- Dr. Elmer Picker in summer Foot Exam: Up to Date Diabetic Education: Completed Pneumovax: refused Influenza: refused Aspirin: yes  HYPERTENSION / HYPERLIPIDEMIA Continues on Atorvastatin 20 MG daily and Telmisartan 40 MG.She would like to come off of medications someday, which provider discussed at length with her today. Her goal is to focus more on natural supplements someday.  Her last LDL in November 73. Satisfied with current treatment? yes Duration of hypertension: chronic BP monitoring frequency: not checking BP range:  BP medication side effects: no Duration of hyperlipidemia: chronic Cholesterol medication side effects: no Cholesterol supplements: none Medication compliance:  good compliance Aspirin: yes Recent stressors: no Recurrent headaches: no Visual changes: no Palpitations: no Dyspnea: no Chest pain: no Lower extremity edema: no Dizzy/lightheaded: no  CHRONIC KIDNEY DISEASE Last labs in November -- CRT 1.21 and GFR 55, BUN 19.  She had initial visit with nephrology, Dr. Thedore Mins, on 10/21/20 -- no changes made to regimen. CKD status: stable Medications renally dose: yes Previous renal evaluation: yes Pneumovax:  Not up to Date Influenza Vaccine:  Not up to Date   OSTEOPENIA Noted on DEXA 01/28/2020 with T-score at left femur -2.2.  Vit D level last check 49.6.   Satisfied with current treatment?: yes Past osteoporosis medications/treatments: none Adequate calcium & vitamin D: yes Weight bearing exercises: yes  Relevant past medical, surgical, family and social history reviewed and updated as indicated. Interim medical history since our last visit reviewed. Allergies and medications reviewed and updated.  Review of Systems  Constitutional: Negative for activity change, appetite change, diaphoresis, fatigue and fever.  Respiratory: Negative for cough, chest tightness and shortness of breath.   Cardiovascular: Negative for chest pain, palpitations and leg swelling.  Gastrointestinal: Negative.   Endocrine: Negative for cold intolerance, heat intolerance, polydipsia, polyphagia and  polyuria.  Neurological: Negative.   Psychiatric/Behavioral: Negative.     Per HPI unless specifically indicated above     Objective:    BP 123/80   Pulse 94   Temp 98.2 F (36.8 C) (Oral)   Wt 210 lb (95.3 kg)   LMP  (LMP Unknown)   SpO2 97%   BMI 36.05 kg/m   Wt Readings from Last 3 Encounters:  02/04/21 210 lb (95.3 kg)  10/27/20 208 lb 9.6 oz (94.6 kg)  09/16/20 206 lb (93.4 kg)    Physical Exam Vitals and nursing note reviewed.  Constitutional:      General: She is awake. She is not in acute distress.    Appearance: She is well-developed. She is  obese. She is not ill-appearing.  HENT:     Head: Normocephalic.     Right Ear: Hearing normal. No drainage.     Left Ear: Hearing normal. No drainage.  Eyes:     General: Lids are normal.        Right eye: No discharge.        Left eye: No discharge.     Conjunctiva/sclera: Conjunctivae normal.     Pupils: Pupils are equal, round, and reactive to light.  Neck:     Vascular: No carotid bruit.  Cardiovascular:     Rate and Rhythm: Normal rate and regular rhythm.     Heart sounds: Normal heart sounds. No murmur heard. No gallop.   Pulmonary:     Effort: Pulmonary effort is normal. No accessory muscle usage or respiratory distress.     Breath sounds: Normal breath sounds.  Abdominal:     General: Bowel sounds are normal.     Palpations: Abdomen is soft.  Musculoskeletal:     Cervical back: Normal range of motion and neck supple.     Right lower leg: No edema.     Left lower leg: No edema.  Skin:    General: Skin is warm and dry.  Neurological:     Mental Status: She is alert and oriented to person, place, and time.  Psychiatric:        Attention and Perception: Attention normal.        Mood and Affect: Mood normal.        Speech: Speech normal.        Behavior: Behavior normal. Behavior is cooperative.        Thought Content: Thought content normal.    Results for orders placed or performed in visit on 10/27/20  Bayer DCA Hb A1c Waived  Result Value Ref Range   HB A1C (BAYER DCA - WAIVED) 6.2 <7.0 %  Basic metabolic panel  Result Value Ref Range   Glucose 118 (H) 65 - 99 mg/dL   BUN 19 8 - 27 mg/dL   Creatinine, Ser 0.62 (H) 0.57 - 1.00 mg/dL   GFR calc non Af Amer 47 (L) >59 mL/min/1.73   GFR calc Af Amer 55 (L) >59 mL/min/1.73   BUN/Creatinine Ratio 16 12 - 28   Sodium 140 134 - 144 mmol/L   Potassium 4.9 3.5 - 5.2 mmol/L   Chloride 104 96 - 106 mmol/L   CO2 22 20 - 29 mmol/L   Calcium 9.7 8.7 - 10.3 mg/dL  Lipid Panel w/o Chol/HDL Ratio  Result Value Ref Range    Cholesterol, Total 150 100 - 199 mg/dL   Triglycerides 97 0 - 149 mg/dL   HDL 59 >37 mg/dL   VLDL Cholesterol Cal  18 5 - 40 mg/dL   LDL Chol Calc (NIH) 73 0 - 99 mg/dL      Assessment & Plan:   Problem List Items Addressed This Visit      Cardiovascular and Mediastinum   Hypertension associated with diabetes (HCC)    Chronic, ongoing.  Below goal with BP today.  Recommend she check BP at home three mornings a week and document for provider.  Continue current medication regimen and adjust as needed.  CMP today.  Educated her on Telmisartan and kidney benefit.  Urine micro alb 10 and A:C <30 today.  Focus on DASH diet at home.  Return in 3 months.      Relevant Orders   Bayer DCA Hb A1c Waived   Microalbumin, Urine Waived   Comprehensive metabolic panel     Respiratory   Sleep apnea    Chronic, continue regular (100%) use of CPAP.        Endocrine   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  A1C today 6.6%, praised for success at maintaining below goal.  Urine ALB 10 today, continue Telmisartan for kidney protection.  Continue Jardiance at this time with goal to someday d/c if possible + Glipizide on her current schedule.  Continue increased diet focus and monitoring BS twice a day.  Recommend focus on healthy weight loss and exercise daily.  Return in 3 months.      Relevant Orders   Bayer DCA Hb A1c Waived   Comprehensive metabolic panel   CKD stage 3 due to type 2 diabetes mellitus (HCC)    Chronic, ongoing.  Continue Telmisartan for kidney protection.  Check CMP today.  Continue collaboration with nephrology, appreciate their input.      Relevant Orders   Microalbumin, Urine Waived   Hyperlipidemia associated with type 2 diabetes mellitus (HCC)    Chronic, ongoing.  Continue current medication regimen and adjust as needed.  Lipid panel today.      Relevant Orders   Bayer DCA Hb A1c Waived   Lipid Panel w/o Chol/HDL  Ratio     Musculoskeletal and Integument   Osteopenia of neck of left femur    Noted on recent DEXA -- continue supplements daily and check Vit D level.  Plan for repeat DEXA in 2026.  Discussed fall prevention.  Vit D level next visit.        Other   Vitamin D deficiency    Ongoing with underlying osteopenia, continue daily supplement and check Vit D level next visit.      Morbid obesity (HCC)    BMI 36.05 with T2DM, HTN.  Recommended eating smaller high protein, low fat meals more frequently and exercising 30 mins a day 5 times a week with a goal of 10-15lb weight loss in the next 3 months. Patient voiced their understanding and motivation to adhere to these recommendations.           Follow up plan: Return in about 3 months (around 05/07/2021) for T2DM, HTN/HLD, VIT D, THYROID CHECK.

## 2021-02-04 NOTE — Patient Instructions (Signed)
Diabetes Mellitus and Nutrition, Adult When you have diabetes, or diabetes mellitus, it is very important to have healthy eating habits because your blood sugar (glucose) levels are greatly affected by what you eat and drink. Eating healthy foods in the right amounts, at about the same times every day, can help you:  Control your blood glucose.  Lower your risk of heart disease.  Improve your blood pressure.  Reach or maintain a healthy weight. What can affect my meal plan? Every person with diabetes is different, and each person has different needs for a meal plan. Your health care provider may recommend that you work with a dietitian to make a meal plan that is best for you. Your meal plan may vary depending on factors such as:  The calories you need.  The medicines you take.  Your weight.  Your blood glucose, blood pressure, and cholesterol levels.  Your activity level.  Other health conditions you have, such as heart or kidney disease. How do carbohydrates affect me? Carbohydrates, also called carbs, affect your blood glucose level more than any other type of food. Eating carbs naturally raises the amount of glucose in your blood. Carb counting is a method for keeping track of how many carbs you eat. Counting carbs is important to keep your blood glucose at a healthy level, especially if you use insulin or take certain oral diabetes medicines. It is important to know how many carbs you can safely have in each meal. This is different for every person. Your dietitian can help you calculate how many carbs you should have at each meal and for each snack. How does alcohol affect me? Alcohol can cause a sudden decrease in blood glucose (hypoglycemia), especially if you use insulin or take certain oral diabetes medicines. Hypoglycemia can be a life-threatening condition. Symptoms of hypoglycemia, such as sleepiness, dizziness, and confusion, are similar to symptoms of having too much  alcohol.  Do not drink alcohol if: ? Your health care provider tells you not to drink. ? You are pregnant, may be pregnant, or are planning to become pregnant.  If you drink alcohol: ? Do not drink on an empty stomach. ? Limit how much you use to:  0-1 drink a day for women.  0-2 drinks a day for men. ? Be aware of how much alcohol is in your drink. In the [place] one drink equals one 12 oz bottle of beer (355 mL), one 5 oz glass of wine (148 mL), or one 1 oz glass of hard liquor (44 mL). ? Keep yourself hydrated with water, diet soda, or unsweetened iced tea.  Keep in mind that regular soda, juice, and other mixers may contain a lot of sugar and must be counted as carbs. What are tips for following this plan? Reading food labels  Start by checking the serving size on the "Nutrition Facts" label of packaged foods and drinks. The amount of calories, carbs, fats, and other nutrients listed on the label is based on one serving of the item. Many items contain more than one serving per package.  Check the total grams (g) of carbs in one serving. You can calculate the number of servings of carbs in one serving by dividing the total carbs by 15. For example, if a food has 30 g of total carbs per serving, it would be equal to 2 servings of carbs.  Check the number of grams (g) of saturated fats and trans fats in one serving. Choose foods that have   a low amount or none of these fats.  Check the number of milligrams (mg) of salt (sodium) in one serving. Most people should limit total sodium intake to less than 2,300 mg per day.  Always check the nutrition information of foods labeled as "low-fat" or "nonfat." These foods may be higher in added sugar or refined carbs and should be avoided.  Talk to your dietitian to identify your daily goals for nutrients listed on the label. Shopping  Avoid buying canned, pre-made, or processed foods. These foods tend to be high in fat, sodium, and added  sugar.  Shop around the outside edge of the grocery store. This is where you will most often find fresh fruits and vegetables, bulk grains, fresh meats, and fresh dairy. Cooking  Use low-heat cooking methods, such as baking, instead of high-heat cooking methods like deep frying.  Cook using healthy oils, such as olive, canola, or sunflower oil.  Avoid cooking with butter, cream, or high-fat meats. Meal planning  Eat meals and snacks regularly, preferably at the same times every day. Avoid going long periods of time without eating.  Eat foods that are high in fiber, such as fresh fruits, vegetables, beans, and whole grains. Talk with your dietitian about how many servings of carbs you can eat at each meal.  Eat 4-6 oz (112-168 g) of lean protein each day, such as lean meat, chicken, fish, eggs, or tofu. One ounce (oz) of lean protein is equal to: ? 1 oz (28 g) of meat, chicken, or fish. ? 1 egg. ?  cup (62 g) of tofu.  Eat some foods each day that contain healthy fats, such as avocado, nuts, seeds, and fish.   What foods should I eat? Fruits Berries. Apples. Oranges. Peaches. Apricots. Plums. Grapes. Mango. Papaya. Pomegranate. Kiwi. Cherries. Vegetables Lettuce. Spinach. Leafy greens, including kale, chard, collard greens, and mustard greens. Beets. Cauliflower. Cabbage. Broccoli. Carrots. Green beans. Tomatoes. Peppers. Onions. Cucumbers. Brussels sprouts. Grains Whole grains, such as whole-wheat or whole-grain bread, crackers, tortillas, cereal, and pasta. Unsweetened oatmeal. Quinoa. Brown or wild rice. Meats and other proteins Seafood. Poultry without skin. Lean cuts of poultry and beef. Tofu. Nuts. Seeds. Dairy Low-fat or fat-free dairy products such as milk, yogurt, and cheese. The items listed above may not be a complete list of foods and beverages you can eat. Contact a dietitian for more information. What foods should I avoid? Fruits Fruits canned with  syrup. Vegetables Canned vegetables. Frozen vegetables with butter or cream sauce. Grains Refined white flour and flour products such as bread, pasta, snack foods, and cereals. Avoid all processed foods. Meats and other proteins Fatty cuts of meat. Poultry with skin. Breaded or fried meats. Processed meat. Avoid saturated fats. Dairy Full-fat yogurt, cheese, or milk. Beverages Sweetened drinks, such as soda or iced tea. The items listed above may not be a complete list of foods and beverages you should avoid. Contact a dietitian for more information. Questions to ask a health care provider  Do I need to meet with a diabetes educator?  Do I need to meet with a dietitian?  What number can I call if I have questions?  When are the best times to check my blood glucose? Where to find more information:  American Diabetes Association: diabetes.org  Academy of Nutrition and Dietetics: www.eatright.org  National Institute of Diabetes and Digestive and Kidney Diseases: www.niddk.nih.gov  Association of Diabetes Care and Education Specialists: www.diabeteseducator.org Summary  It is important to have healthy eating   habits because your blood sugar (glucose) levels are greatly affected by what you eat and drink.  A healthy meal plan will help you control your blood glucose and maintain a healthy lifestyle.  Your health care provider may recommend that you work with a dietitian to make a meal plan that is best for you.  Keep in mind that carbohydrates (carbs) and alcohol have immediate effects on your blood glucose levels. It is important to count carbs and to use alcohol carefully. This information is not intended to replace advice given to you by your health care provider. Make sure you discuss any questions you have with your health care provider. Document Revised: 10/22/2019 Document Reviewed: 10/22/2019 Elsevier Patient Education  2021 [employer].  

## 2021-02-04 NOTE — Assessment & Plan Note (Addendum)
BMI 36.05 with T2DM, HTN.  Recommended eating smaller high protein, low fat meals more frequently and exercising 30 mins a day 5 times a week with a goal of 10-15lb weight loss in the next 3 months. Patient voiced their understanding and motivation to adhere to these recommendations.

## 2021-02-04 NOTE — Assessment & Plan Note (Addendum)
Chronic, ongoing.  A1C today 6.6%, praised for success at maintaining below goal.  Urine ALB 10 today, continue Telmisartan for kidney protection.  Continue Jardiance at this time with goal to someday d/c if possible + Glipizide on her current schedule.  Continue increased diet focus and monitoring BS twice a day.  Recommend focus on healthy weight loss and exercise daily.  Return in 3 months.

## 2021-02-04 NOTE — Assessment & Plan Note (Signed)
Chronic, ongoing.  Continue Telmisartan for kidney protection.  Check CMP today.  Continue collaboration with nephrology, appreciate their input.

## 2021-02-04 NOTE — Assessment & Plan Note (Signed)
Ongoing with underlying osteopenia, continue daily supplement and check Vit D level next visit.

## 2021-02-04 NOTE — Assessment & Plan Note (Signed)
Chronic, ongoing.  Below goal with BP today.  Recommend she check BP at home three mornings a week and document for provider.  Continue current medication regimen and adjust as needed.  CMP today.  Educated her on Telmisartan and kidney benefit.  Urine micro alb 10 and A:C <30 today.  Focus on DASH diet at home.  Return in 3 months.

## 2021-02-05 LAB — COMPREHENSIVE METABOLIC PANEL
ALT: 15 IU/L (ref 0–32)
AST: 21 IU/L (ref 0–40)
Albumin/Globulin Ratio: 1.2 (ref 1.2–2.2)
Albumin: 4.1 g/dL (ref 3.8–4.8)
Alkaline Phosphatase: 96 IU/L (ref 44–121)
BUN/Creatinine Ratio: 11 — ABNORMAL LOW (ref 12–28)
BUN: 14 mg/dL (ref 8–27)
Bilirubin Total: <0.2 mg/dL (ref 0.0–1.2)
CO2: 15 mmol/L — ABNORMAL LOW (ref 20–29)
Calcium: 9.4 mg/dL (ref 8.7–10.3)
Chloride: 106 mmol/L (ref 96–106)
Creatinine, Ser: 1.26 mg/dL — ABNORMAL HIGH (ref 0.57–1.00)
Globulin, Total: 3.5 g/dL (ref 1.5–4.5)
Glucose: 126 mg/dL — ABNORMAL HIGH (ref 65–99)
Potassium: 4.9 mmol/L (ref 3.5–5.2)
Sodium: 141 mmol/L (ref 134–144)
Total Protein: 7.6 g/dL (ref 6.0–8.5)
eGFR: 47 mL/min/{1.73_m2} — ABNORMAL LOW (ref >59)

## 2021-02-05 LAB — LIPID PANEL W/O CHOL/HDL RATIO
Cholesterol, Total: 137 mg/dL (ref 100–199)
HDL: 53 mg/dL (ref >39)
LDL Chol Calc (NIH): 60 mg/dL (ref 0–99)
Triglycerides: 136 mg/dL (ref 0–149)
VLDL Cholesterol Cal: 24 mg/dL (ref 5–40)

## 2021-02-05 NOTE — Progress Notes (Signed)
Contacted via MyChart   Good morning Edithe, your labs have returned and overall cholesterol levels remain at goal and kidneys continue to show stable kidney disease with no decline.  Liver function is normal  Any questions?  Great job!! Keep being amazing!!  Thank you for allowing me to participate in your care. Kindest regards, Korin Hartwell

## 2021-02-09 ENCOUNTER — Telehealth: Payer: Self-pay | Admitting: General Practice

## 2021-02-09 ENCOUNTER — Ambulatory Visit (INDEPENDENT_AMBULATORY_CARE_PROVIDER_SITE_OTHER): Payer: Medicare Other | Admitting: General Practice

## 2021-02-09 DIAGNOSIS — E1121 Type 2 diabetes mellitus with diabetic nephropathy: Secondary | ICD-10-CM | POA: Diagnosis not present

## 2021-02-09 DIAGNOSIS — E1169 Type 2 diabetes mellitus with other specified complication: Secondary | ICD-10-CM

## 2021-02-09 DIAGNOSIS — I152 Hypertension secondary to endocrine disorders: Secondary | ICD-10-CM

## 2021-02-09 DIAGNOSIS — E785 Hyperlipidemia, unspecified: Secondary | ICD-10-CM | POA: Diagnosis not present

## 2021-02-09 DIAGNOSIS — E1159 Type 2 diabetes mellitus with other circulatory complications: Secondary | ICD-10-CM | POA: Diagnosis not present

## 2021-02-09 NOTE — Chronic Care Management (AMB) (Signed)
Chronic Care Management   CCM RN Visit Note  02/09/2021 Name: Kim Thomas MRN: [id number] DOB: [birth year]  Subjective: Kim Thomas is a 65 y.o. year old female who is a primary care patient of Cannady, [name], NP. The care management team was consulted for assistance with disease management and care coordination needs.    Engaged with patient by telephone for follow up visit in response to provider referral for case management and/or care coordination services.   Consent to Services:  The patient was given information about Chronic Care Management services, agreed to services, and gave verbal consent prior to initiation of services.  Please see initial visit note for detailed documentation.   Patient agreed to services and verbal consent obtained.   Assessment: Review of patient past medical history, allergies, medications, health status, including review of consultants reports, laboratory and other test data, was performed as part of comprehensive evaluation and provision of chronic care management services.   SDOH (Social Determinants of Health) assessments and interventions performed:    CCM Care Plan  Allergies  Allergen Reactions  . Morphine And Related Itching    It burned, and made patient itch and go out of her mind.  . Erythromycin     "Been so long ago," patient unable to remember reaction  . Hydrocodone-Acetaminophen Itching    Outpatient Encounter Medications as of 02/09/2021  Medication Sig Note  . acetaminophen (TYLENOL) 500 MG tablet Take 500 mg by mouth daily as needed. 07/13/2020: Taking one tablet twice a day.  [name] ascorbic acid (VITAMIN C) 1000 MG tablet Take by mouth.   . Aspirin (VAZALORE) 81 MG CAPS Take by mouth.   [name] atorvastatin (LIPITOR) 20 MG tablet TAKE 1 TABLET BY MOUTH  DAILY   . BLACK CURRANT SEED OIL PO Take 1,250 mg by mouth 2 (two) times daily.   . Blood Glucose Monitoring Suppl (ONE TOUCH ULTRA 2) w/Device KIT Use to check blood sugar 3-4 times  daily and document for visits. ICD E11.21   . CALCIUM CITRATE PO Take 1,000 mg by mouth daily.   . chlorhexidine (PERIDEX) 0.12 % solution SMARTSIG:By Mouth   . Cholecalciferol (VITAMIN D3) 2000 units TABS Take 2,000 Units by mouth daily.   [name] Co-Enzyme Q-10 100 MG CAPS Take 50 mg by mouth daily.   . cyclobenzaprine (FLEXERIL) 10 MG tablet Take 10 mg by mouth at bedtime. Take 1/2 tablet to 1 as needed  At bed time   . diclofenac sodium (VOLTAREN) 1 % GEL Apply topically.   [name] ALLERGY RELIEF, CETIRIZINE, 10 MG tablet Take 1 tablet by mouth once daily   . fluticasone (FLONASE) 50 MCG/ACT nasal spray USE 1 TO 2 SPRAY(S) IN EACH NOSTRIL TWICE DAILY   . gabapentin (NEURONTIN) 400 MG capsule TAKE 1 CAPSULE BY MOUTH 3  TIMES DAILY   . glipiZIDE (GLUCOTROL) 5 MG tablet TAKE ONE-HALF TABLET BY  MOUTH DAILY BEFORE  BREAKFAST   . JARDIANCE 25 MG TABS tablet TAKE 1 TABLET BY MOUTH  DAILY   . Lancets (ONETOUCH DELICA PLUS [id number]) MISC 1 each by Does not apply route 2 (two) times daily.   . Magnesium 400 MG TABS Take by mouth.   . Multiple Vitamin (MULTIVITAMIN) tablet Take 1 tablet by mouth daily.   . Olopatadine HCl (PATADAY OP) Apply 1 drop to eye daily.   . Omega-3 1000 MG CAPS Take 1 capsule by mouth daily.   [name] omeprazole (PRILOSEC) 20 MG capsule TAKE 1 CAPSULE BY MOUTH  DAILY   . ONETOUCH ULTRA test strip USE TO CHECK BLOOD SUGAR  DAILY   . Potassium 99 MG TABS Take by mouth.   . telmisartan (MICARDIS) 40 MG tablet Take 1 tablet (40 mg total) by mouth daily.   [name] triamcinolone cream (KENALOG) 0.1 % Apply 1 application topically 2 (two) times daily.   . vitamin E 100 UNIT capsule Take 100 Units by mouth daily.    No facility-administered encounter medications on file as of 02/09/2021.    Patient Active Problem List   Diagnosis Date Noted  . Morbid obesity ([place]) 07/17/2020  . Osteopenia of neck of left femur 03/04/2020  . Multinodular thyroid 01/02/2020  . Vitamin D deficiency 12/03/2019  .  Hyperlipidemia associated with type 2 diabetes mellitus ([place]) 04/03/2018  . Advanced care planning/counseling discussion 02/20/2017  . Elevated uric acid in blood 06/21/2016  . Allergic rhinitis 06/06/2016  . CKD stage 3 due to type 2 diabetes mellitus ([place]) 06/06/2016  . Type 2 diabetes mellitus with diabetic nephropathy ([place]) 05/25/2016  . Hypertension associated with diabetes ([place]) 05/25/2016  . Arthritis of hand, right 05/25/2016  . Sleep apnea 05/25/2016  . 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 Lab Results .  Component . Value . Date .   [name] HGBA1C . 6.6 . 02/04/2021 .    [name] Lab Results .  Component . Value . Date .   [name] CREATININE . 1.26 (H) . 02/04/2021 .   [name] BUN . 14 . 02/04/2021 .   [name] NA . 141 . 02/04/2021 .   . K . 4.9 . 02/04/2021 .   . CL . 106 . 02/04/2021 .   [name] CO2 . 15 (L) . 02/04/2021 .     [name] No results found for: EGFR Current Barriers:  [name] Knowledge Deficits related to basic Diabetes pathophysiology and self care/management . Knowledge Deficits related to medications used for management of diabetes . Limited Social Support . Unable to independently manage DM . Lacks social connections . Does not contact provider office for questions/concerns Case Manager Clinical Goal(s):  [name] Collaboration with Venita Lick, NP regarding development and update of comprehensive plan of care as evidenced by provider attestation and co-signature . Inter-disciplinary care team collaboration (see longitudinal plan of care) . Over the next 120 days, patient will demonstrate improved adherence to prescribed treatment plan for diabetes self care/management as evidenced by:   . daily monitoring and recording of CBG  . adherence to ADA/ carb modified diet . adherence to prescribed medication regimen Interventions:  . Provided education to patient about basic DM disease process. 02-09-2021: The patient saw the pcp recently and got a good report. Hemoglobin A1C elevated a little more but still <7.0. Review of plan of care. . Reviewed medications with patient and discussed importance of medication adherence . Discussed plans with patient for ongoing care management follow up and provided patient with direct contact information for care management team . Provided patient with written educational materials related to hypo and hyperglycemia and importance of correct treatment. 02-09-2021: The patient denies any lows at this time. Denies any acute distress. Working on exercise routine  and dietary changes  . Reviewed scheduled/upcoming provider appointments including: 05-06-2021 and 0840 am . Advised patient, providing education and rationale, to check cbg BID and record, calling pcp for findings outside established parameters.  02-09-2021: The patient checks blood sugars regularly. Knows how to manage highs and lows . Review of patient status, including review of consultants reports, relevant laboratory and other test results, and medications completed. Patient Goals/Self-Care Activities . Over the next 120 days, patient will:  - UNABLE to independently DM Self administers oral medications as prescribed Attends all scheduled provider appointments Checks blood sugars as prescribed and utilize hyper and hypoglycemia protocol as needed Adheres to prescribed ADA/carb modified - barriers to adherence to treatment plan identified - blood glucose readings reviewed - individualized medical nutrition therapy provided - mutual A1C goal set or reviewed - resources required to improve adherence to care identified - self-awareness of signs/symptoms of hypo or hyperglycemia encouraged -  use of blood glucose monitoring log promoted Follow Up Plan: Telephone follow up appointment with care management team member scheduled for: 04-13-2021 at 1145 am   Care Plan : RNCM: Hypertension (Adult)  Updates made by Vanita Ingles since 02/09/2021 12:00 AM    Problem: RNCM: Hypertension (Hypertension)   Priority: Medium    Goal: RNCM: Hypertension Monitored   Note:   Objective:  . Last practice recorded BP readings:  . BP Readings from Last 3 Encounters: .  02/04/21 . 123/80 .  10/27/20 . 114/74 .  09/16/20 . 119/75 .    [name] Most recent eGFR/CrCl: No results found for: EGFR  No components found for: CRCL Current Barriers:  [name] Knowledge Deficits related to basic understanding of hypertension pathophysiology and self care management . Knowledge Deficits related to understanding of medications prescribed for management of hypertension . Limited Social Support . Unable to independently HTN . Lacks social connections Case Manager Clinical Goal(s):  [name] Over the next 120 days, patient will verbalize understanding of plan for hypertension management . Over the next 120 days, patient will attend all scheduled medical appointments: 05-06-2021 at 0840 am  . Over the next 120 days, patient will demonstrate improved adherence to prescribed treatment plan for hypertension as evidenced by taking all medications as prescribed, monitoring and recording blood pressure as directed, adhering to low sodium/DASH diet . Over the next 120 days, patient will demonstrate improved health management independence as evidenced by checking blood pressure as directed and notifying PCP if SBP>160 or DBP > 90, taking all medications as prescribe, and adhering to a low sodium diet as discussed. . Over the next 120 days, patient will verbalize basic understanding of hypertension disease process and self health management plan as evidenced by compliance with medications, heart healthy/ADA diet, working with the CCM team to  optimize health and well being Interventions:  . Collaboration with Venita Lick, NP regarding development and update of comprehensive plan of care as evidenced by provider attestation and co-signature . Inter-disciplinary care team collaboration (see longitudinal plan of care) . Evaluation of current treatment plan related to hypertension self management and patient's adherence to plan as established by provider. 02-09-2021: The patient saw the pcp recently and got a good report. The patient is wanting to lose weight and has set a goal of 15 pounds.  Thinks the goal may be a little high. Discussed small steps and making goals to work towards.  The patient is wanting to get out and walk.  She is active and overall happy with her health.  Looking forward to warmer weather.  . Provided education to patient re: stroke prevention, s/s of heart attack and stroke, DASH diet, complications of uncontrolled blood pressure . Reviewed medications with patient and discussed importance of compliance . Discussed plans with patient for ongoing care management follow up and provided patient with direct contact information for care management team . Advised patient, providing education and rationale, to monitor blood pressure daily and record, calling PCP for findings outside established parameters.  . Reviewed scheduled/upcoming provider appointments including: 02-09-2021 at [place] am Patient Goals/Self-Care Activities . Over the next 120 days, patient will:  - Self administers medications as prescribed Attends all scheduled provider appointments Calls provider office for new concerns, questions, or BP outside discussed parameters Checks BP and records as discussed Follows a low sodium diet/DASH diet - blood pressure trends reviewed - depression screen reviewed - home or ambulatory blood pressure monitoring encouraged Follow Up Plan: Telephone follow up appointment with care management team member scheduled for:  04-13-2021 at 1145 am   Care Plan : RNCM: HLD Management  Updates made by Vanita Ingles since 02/09/2021 12:00 AM    Problem: RNCM: HLD   Priority: Medium    Goal: RNCM: Management of HLD   Priority: Medium  Note:   Current Barriers:  . Poorly controlled hyperlipidemia, complicated by HTN, DM2 . Current antihyperlipidemic regimen: Lipitor 20 mg . Most recent lipid panel:  . Lab Results .  Component . Value . Date .   [name] CHOL . 137 . 02/04/2021 .   [name] CHOL . 150 . 10/27/2020 .   [name] CHOL . 141 . 03/04/2020 .   [name] Lab Results .  Component . Value . Date .   [name] HDL . 53 . 02/04/2021 .   [name] HDL . 59 . 10/27/2020 .   [name] HDL . 52 . 03/04/2020 .   [name] Lab Results .  Component . Value . Date .   [name] [place] . 60 . 02/04/2021 .   [name] [place] . 73 . 10/27/2020 .   [name] [place] . 62 . 03/04/2020 .   [name] Lab Results .  Component . Value . Date .   [name] TRIG . 136 . 02/04/2021 .   [name] TRIG . 97 . 10/27/2020 .   [name] TRIG . 160 (H) . 03/04/2020 .   [name] Lab Results .  Component . Value . Date .   [name] CHOLHDL . 2.5 . 07/10/2018 .   [name] No results found for: LDLDIRECT  . ASCVD risk enhancing conditions: age 89, DM, HTN, CKD, former smoker . Unable to independently manage HLD . Lacks social connections . Does not contact provider office for questions/concerns  RN Care Manager Clinical Goal(s):  [name] Over the next 120 days, patient will work with Consulting civil engineer, providers, and care team towards execution of optimized self-health management plan . Over the next 120 days, patient will verbalize understanding of plan for effective management of HLD . Over the next 120 days, patient will work with [facility] and CCM team  to address needs related to HLD management  . Over the next 120  days, patient will attend all scheduled medical appointments: 05-06-2021 at 0840 am  Interventions: . Collaboration with Venita Lick, NP regarding development and update of comprehensive plan of care as evidenced by provider attestation  and co-signature . Inter-disciplinary care team collaboration (see longitudinal plan of care) . Medication review performed; medication list updated in electronic medical record.  [name]  Inter-disciplinary care team collaboration (see longitudinal plan of care) . Referred to pharmacy team for assistance with HLD medication management . Evaluation of current treatment plan related to HLD and patient's adherence to plan as established by provider. 02-09-2021: Had recent visit with the pcp and lab work. Good control of HLD at this time.  . Advised patient to call the office for changes in condition or questions. Also to write down questions to have for pcp at her next visit.  [name] Provided education to patient re: dietary restrictions and following a heart healthy/ADA diet  . Discussed plans with patient for ongoing care management follow up and provided patient with direct contact information for care management team . Provided patient with DASH educational materials related to heart healthy diet    Patient Goals/Self-Care Activities: . Over the next 120 days, patient will:   - call for medicine refill 2 or 3 days before it runs out - call if I am sick and can't take my medicine - keep a list of all the medicines I take; vitamins and herbals too - learn to read medicine labels - use a pillbox to sort medicine - use an alarm clock or phone to remind me to take my medicine - change to whole grain breads, cereal, pasta - drink 6 to 8 glasses of water each day - eat 3 to 5 servings of fruits and vegetables each day - eat 5 or 6 small meals each day - fill half the plate with nonstarchy vegetables - limit fast food meals to no more than 1 per week - manage portion size - prepare main meal at home 3 to 5 days each week - reduce red meat to 2 to 3 times a week - set a realistic goal - be open to making changes - I can manage, know and watch for signs of a heart attack - if I have chest pain, call for  help - learn about small changes that will make a big difference - learn my personal risk factors   Follow Up Plan: Telephone follow up appointment with care management team member scheduled for: 04-13-2021 at 1145 am       Plan:Telephone follow up appointment with care management team member scheduled for:  04-13-2021 at [place] am  Noreene Larsson RN, MSN, [facility] Mobile: [phone]

## 2021-02-09 NOTE — Patient Instructions (Signed)
Visit Information  PATIENT GOALS: Patient Care Plan: RNCM: Diabetes Type 2 (Adult)    Problem Identified: RNCM: Glycemic Management (Diabetes, Type 2)   Priority: Medium    Goal: RNCM: Glycemic Management Optimized   Priority: Medium  Note:   Objective:  . Lab Results .  Component . Value . Date .   [name] HGBA1C . 6.6 . 02/04/2021 .    [name] Lab Results .  Component . Value . Date .   [name] CREATININE . 1.26 (H) . 02/04/2021 .   [name] BUN . 14 . 02/04/2021 .   [name] NA . 141 . 02/04/2021 .   . K . 4.9 . 02/04/2021 .   . CL . 106 . 02/04/2021 .   [name] CO2 . 15 (L) . 02/04/2021 .     [name] No results found for: EGFR Current Barriers:  [name] Knowledge Deficits related to basic Diabetes pathophysiology and self care/management . Knowledge Deficits related to medications used for management of diabetes . Limited Social Support . Unable to independently manage DM . Lacks social connections . Does not contact provider office for questions/concerns Case Manager Clinical Goal(s):  [name] Collaboration with Venita Lick, NP regarding development and update of comprehensive plan of care as evidenced by provider attestation and co-signature . Inter-disciplinary care team collaboration (see longitudinal plan of care) . Over the next 120 days, patient will demonstrate improved adherence to prescribed treatment plan for diabetes self care/management as evidenced by:  . daily monitoring and recording of CBG  . adherence to ADA/ carb modified diet . adherence to prescribed medication regimen Interventions:  . Provided education to patient about basic DM disease process. 02-09-2021: The patient saw the pcp recently and got a good report. Hemoglobin A1C elevated a little more but still <7.0. Review of plan of care. . Reviewed medications with patient and discussed importance of medication adherence . Discussed plans with patient for ongoing care management follow up and provided patient with direct contact information for  care management team . Provided patient with written educational materials related to hypo and hyperglycemia and importance of correct treatment. 02-09-2021: The patient denies any lows at this time. Denies any acute distress. Working on exercise routine and dietary changes  . Reviewed scheduled/upcoming provider appointments including: 05-06-2021 and 0840 am . Advised patient, providing education and rationale, to check cbg BID and record, calling pcp for findings outside established parameters.  02-09-2021: The patient checks blood sugars regularly. Knows how to manage highs and lows . Review of patient status, including review of consultants reports, relevant laboratory and other test results, and medications completed. Patient Goals/Self-Care Activities . Over the next 120 days, patient will:  - UNABLE to independently DM Self administers oral medications as prescribed Attends all scheduled provider appointments Checks blood sugars as prescribed and utilize hyper and hypoglycemia protocol as needed Adheres to prescribed ADA/carb modified - barriers to adherence to treatment plan identified - blood glucose readings reviewed - individualized medical nutrition therapy provided - mutual A1C goal set or reviewed - resources required to improve adherence to care identified - self-awareness of signs/symptoms of hypo or hyperglycemia encouraged - use of blood glucose monitoring log promoted Follow Up Plan: Telephone follow up appointment with care management team member scheduled for: 04-13-2021 at 1145 am   Task: RNCM: Alleviate Barriers to Glycemic Management   Note:   Care Management Activities:    - barriers to adherence to treatment plan identified - blood glucose readings reviewed - individualized  medical nutrition therapy provided - mutual A1C goal set or reviewed - resources required to improve adherence to care identified - self-awareness of signs/symptoms of hypo or hyperglycemia  encouraged - use of blood glucose monitoring log promoted       Patient Care Plan: RNCM: Hypertension (Adult)    Problem Identified: RNCM: Hypertension (Hypertension)   Priority: Medium    Goal: RNCM: Hypertension Monitored   Note:   Objective:  . Last practice recorded BP readings:  . BP Readings from Last 3 Encounters: .  02/04/21 . 123/80 .  10/27/20 . 114/74 .  09/16/20 . 119/75 .    [name] Most recent eGFR/CrCl: No results found for: EGFR  No components found for: CRCL Current Barriers:  [name] Knowledge Deficits related to basic understanding of hypertension pathophysiology and self care management . Knowledge Deficits related to understanding of medications prescribed for management of hypertension . Limited Social Support . Unable to independently HTN . Lacks social connections Case Manager Clinical Goal(s):  [name] Over the next 120 days, patient will verbalize understanding of plan for hypertension management . Over the next 120 days, patient will attend all scheduled medical appointments: 05-06-2021 at 0840 am  . Over the next 120 days, patient will demonstrate improved adherence to prescribed treatment plan for hypertension as evidenced by taking all medications as prescribed, monitoring and recording blood pressure as directed, adhering to low sodium/DASH diet . Over the next 120 days, patient will demonstrate improved health management independence as evidenced by checking blood pressure as directed and notifying PCP if SBP>160 or DBP > 90, taking all medications as prescribe, and adhering to a low sodium diet as discussed. . Over the next 120 days, patient will verbalize basic understanding of hypertension disease process and self health management plan as evidenced by compliance with medications, heart healthy/ADA diet, working with the CCM team to optimize health and well being Interventions:  . Collaboration with Venita Lick, NP regarding development and update of  comprehensive plan of care as evidenced by provider attestation and co-signature . Inter-disciplinary care team collaboration (see longitudinal plan of care) . Evaluation of current treatment plan related to hypertension self management and patient's adherence to plan as established by provider. 02-09-2021: The patient saw the pcp recently and got a good report. The patient is wanting to lose weight and has set a goal of 15 pounds.  Thinks the goal may be a little high. Discussed small steps and making goals to work towards.  The patient is wanting to get out and walk.  She is active and overall happy with her health. Looking forward to warmer weather.  . Provided education to patient re: stroke prevention, s/s of heart attack and stroke, DASH diet, complications of uncontrolled blood pressure . Reviewed medications with patient and discussed importance of compliance . Discussed plans with patient for ongoing care management follow up and provided patient with direct contact information for care management team . Advised patient, providing education and rationale, to monitor blood pressure daily and record, calling PCP for findings outside established parameters.  . Reviewed scheduled/upcoming provider appointments including: 02-09-2021 at [place] am Patient Goals/Self-Care Activities . Over the next 120 days, patient will:  - Self administers medications as prescribed Attends all scheduled provider appointments Calls provider office for new concerns, questions, or BP outside discussed parameters Checks BP and records as discussed Follows a low sodium diet/DASH diet - blood pressure trends reviewed - depression screen reviewed - home or ambulatory blood pressure  monitoring encouraged Follow Up Plan: Telephone follow up appointment with care management team member scheduled for: 04-13-2021 at 1145 am   Task: RNCM: Identify and Monitor Blood Pressure Elevation   Note:   Care Management Activities:     - blood pressure trends reviewed - depression screen reviewed - home or ambulatory blood pressure monitoring encouraged       Patient Care Plan: RNCM: HLD Management    Problem Identified: RNCM: HLD   Priority: Medium    Goal: RNCM: Management of HLD   Priority: Medium  Note:   Current Barriers:  . Poorly controlled hyperlipidemia, complicated by HTN, DM2 . Current antihyperlipidemic regimen: Lipitor 20 mg . Most recent lipid panel:  . Lab Results .  Component . Value . Date .   [name] CHOL . 137 . 02/04/2021 .   [name] CHOL . 150 . 10/27/2020 .   [name] CHOL . 141 . 03/04/2020 .   [name] Lab Results .  Component . Value . Date .   [name] HDL . 53 . 02/04/2021 .   [name] HDL . 59 . 10/27/2020 .   [name] HDL . 52 . 03/04/2020 .   [name] Lab Results .  Component . Value . Date .   [name] [place] . 60 . 02/04/2021 .   [name] [place] . 73 . 10/27/2020 .   [name] [place] . 62 . 03/04/2020 .   [name] Lab Results .  Component . Value . Date .   [name] TRIG . 136 . 02/04/2021 .   [name] TRIG . 97 . 10/27/2020 .   [name] TRIG . 160 (H) . 03/04/2020 .   [name] Lab Results .  Component . Value . Date .   [name] CHOLHDL . 2.5 . 07/10/2018 .   [name] No results found for: LDLDIRECT  . ASCVD risk enhancing conditions: age 65, DM, HTN, CKD, former smoker . Unable to independently manage HLD . Lacks social connections . Does not contact provider office for questions/concerns  RN Care Manager Clinical Goal(s):  [name] Over the next 120 days, patient will work with Consulting civil engineer, providers, and care team towards execution of optimized self-health management plan . Over the next 120 days, patient will verbalize understanding of plan for effective management of HLD . Over the next 120 days, patient will work with [facility] and CCM team  to address needs related to HLD management  . Over the next 120  days, patient will attend all scheduled medical appointments: 05-06-2021 at 0840 am  Interventions: . Collaboration with Venita Lick, NP regarding development and update  of comprehensive plan of care as evidenced by provider attestation and co-signature . Inter-disciplinary care team collaboration (see longitudinal plan of care) . Medication review performed; medication list updated in electronic medical record.  Bertram Savin care team collaboration (see longitudinal plan of care) . Referred to pharmacy team for assistance with HLD medication management . Evaluation of current treatment plan related to HLD and patient's adherence to plan as established by provider. 02-09-2021: Had recent visit with the pcp and lab work. Good control of HLD at this time.  . Advised patient to call the office for changes in condition or questions. Also to write down questions to have for pcp at her next visit.  [name] Provided education to patient re: dietary restrictions and following a heart healthy/ADA diet  . Discussed plans with patient for ongoing care management follow up and provided patient with direct contact information for care management team .  Provided patient with DASH educational materials related to heart healthy diet    Patient Goals/Self-Care Activities: . Over the next 120 days, patient will:   - call for medicine refill 2 or 3 days before it runs out - call if I am sick and can't take my medicine - keep a list of all the medicines I take; vitamins and herbals too - learn to read medicine labels - use a pillbox to sort medicine - use an alarm clock or phone to remind me to take my medicine - change to whole grain breads, cereal, pasta - drink 6 to 8 glasses of water each day - eat 3 to 5 servings of fruits and vegetables each day - eat 5 or 6 small meals each day - fill half the plate with nonstarchy vegetables - limit fast food meals to no more than 1 per week - manage portion size - prepare main meal at home 3 to 5 days each week - reduce red meat to 2 to 3 times a week - set a realistic goal - be open to making changes - I can manage, know and  watch for signs of a heart attack - if I have chest pain, call for help - learn about small changes that will make a big difference - learn my personal risk factors   Follow Up Plan: Telephone follow up appointment with care management team member scheduled for: 04-13-2021 at 1145 am       Patient verbalizes understanding of instructions provided today and agrees to view in [place].   Telephone follow up appointment with care management team member scheduled for: 04-13-2021 at 1145 am  [place], MSN, [facility] Mobile: [phone]

## 2021-02-22 DIAGNOSIS — E1122 Type 2 diabetes mellitus with diabetic chronic kidney disease: Secondary | ICD-10-CM | POA: Diagnosis not present

## 2021-02-22 DIAGNOSIS — N1831 Chronic kidney disease, stage 3a: Secondary | ICD-10-CM | POA: Diagnosis not present

## 2021-02-22 DIAGNOSIS — E872 Acidosis: Secondary | ICD-10-CM | POA: Diagnosis not present

## 2021-02-22 DIAGNOSIS — I10 Essential (primary) hypertension: Secondary | ICD-10-CM | POA: Diagnosis not present

## 2021-03-01 ENCOUNTER — Other Ambulatory Visit: Payer: Self-pay | Admitting: Nurse Practitioner

## 2021-03-01 DIAGNOSIS — Z1231 Encounter for screening mammogram for malignant neoplasm of breast: Secondary | ICD-10-CM

## 2021-03-12 ENCOUNTER — Encounter: Payer: Self-pay | Admitting: Nurse Practitioner

## 2021-03-12 DIAGNOSIS — E119 Type 2 diabetes mellitus without complications: Secondary | ICD-10-CM | POA: Diagnosis not present

## 2021-03-12 DIAGNOSIS — H04123 Dry eye syndrome of bilateral lacrimal glands: Secondary | ICD-10-CM | POA: Diagnosis not present

## 2021-03-12 DIAGNOSIS — H524 Presbyopia: Secondary | ICD-10-CM | POA: Diagnosis not present

## 2021-03-12 DIAGNOSIS — H40013 Open angle with borderline findings, low risk, bilateral: Secondary | ICD-10-CM | POA: Diagnosis not present

## 2021-03-12 DIAGNOSIS — H35013 Changes in retinal vascular appearance, bilateral: Secondary | ICD-10-CM | POA: Diagnosis not present

## 2021-03-12 LAB — HM DIABETES EYE EXAM

## 2021-03-17 ENCOUNTER — Ambulatory Visit
Admission: RE | Admit: 2021-03-17 | Discharge: 2021-03-17 | Disposition: A | Discharge status: Home / Self Care | Payer: Medicare Other | Source: Ambulatory Visit | Attending: Nurse Practitioner | Admitting: Nurse Practitioner

## 2021-03-17 ENCOUNTER — Other Ambulatory Visit: Payer: Self-pay

## 2021-03-17 DIAGNOSIS — Z1231 Encounter for screening mammogram for malignant neoplasm of breast: Secondary | ICD-10-CM | POA: Diagnosis not present

## 2021-03-31 ENCOUNTER — Other Ambulatory Visit: Payer: Self-pay | Admitting: Nurse Practitioner

## 2021-03-31 NOTE — Telephone Encounter (Signed)
Requested Prescriptions  Pending Prescriptions Disp Refills  . JARDIANCE 25 MG TABS tablet [Pharmacy Med Name: Jardiance 25 MG Oral Tablet] 90 tablet 1    Sig: TAKE 1 TABLET BY MOUTH  DAILY     Endocrinology:  Diabetes - SGLT2 Inhibitors Failed - 03/31/2021 10:55 PM      Failed - Cr in normal range and within 360 days    Creatinine, Ser  Date Value Ref Range Status  02/04/2021 1.26 (H) 0.57 - 1.00 mg/dL Final         Failed - AA eGFR in normal range and within 360 days    GFR calc Af Amer  Date Value Ref Range Status  10/27/2020 55 (L) >59 mL/min/1.73 Final    Comment:    **In accordance with recommendations from the NKF-ASN Task force,**   [facility] is in the process of updating its eGFR calculation to the   2021 CKD-EPI creatinine equation that estimates kidney function   without a race variable.    GFR calc non Af Amer  Date Value Ref Range Status  10/27/2020 47 (L) >59 mL/min/1.73 Final   eGFR  Date Value Ref Range Status  02/04/2021 47 (L) >59 mL/min/1.73 Final         Passed - LDL in normal range and within 360 days    LDL Chol Calc (NIH)  Date Value Ref Range Status  02/04/2021 60 0 - 99 mg/dL Final         Passed - HBA1C is between 0 and 7.9 and within 180 days    Hemoglobin A1C  Date Value Ref Range Status  03/17/2016 7.2  Final   HB A1C (BAYER DCA - WAIVED)  Date Value Ref Range Status  02/04/2021 6.6 <7.0 % Final    Comment:                                          Diabetic Adult            <7.0                                       Healthy Adult        4.3 - 5.7                                                           (DCCT/NGSP) American Diabetes Association's Summary of Glycemic Recommendations for Adults with Diabetes: Hemoglobin A1c <7.0%. More stringent glycemic goals (A1c <6.0%) may further reduce complications at the cost of increased risk of hypoglycemia.          Passed - Valid encounter within last 6 months    Recent Outpatient Visits           1 month ago Type 2 diabetes mellitus with diabetic nephropathy, without long-term current use of insulin ([place])   [place], [place] T, NP   2 months ago Sinus pressure   [place], [place] T, NP   5 months ago Type 2 diabetes mellitus with diabetic nephropathy, without long-term current use of insulin ([place])   [facility] [place], Henrine Screws T, NP   6 months ago Left arm numbness   [facility] [place], [place] T, NP   8 months ago Hypertension associated with diabetes ([place])   [place], [name], NP      Future Appointments            In 1 month [place], [name], NP [employer]   In 4 months  [employer] [facility]

## 2021-04-13 ENCOUNTER — Ambulatory Visit (INDEPENDENT_AMBULATORY_CARE_PROVIDER_SITE_OTHER): Payer: Medicare Other | Admitting: General Practice

## 2021-04-13 ENCOUNTER — Telehealth: Payer: Self-pay | Admitting: General Practice

## 2021-04-13 DIAGNOSIS — E785 Hyperlipidemia, unspecified: Secondary | ICD-10-CM

## 2021-04-13 DIAGNOSIS — E1159 Type 2 diabetes mellitus with other circulatory complications: Secondary | ICD-10-CM | POA: Diagnosis not present

## 2021-04-13 DIAGNOSIS — E1169 Type 2 diabetes mellitus with other specified complication: Secondary | ICD-10-CM

## 2021-04-13 DIAGNOSIS — I152 Hypertension secondary to endocrine disorders: Secondary | ICD-10-CM

## 2021-04-13 DIAGNOSIS — E1121 Type 2 diabetes mellitus with diabetic nephropathy: Secondary | ICD-10-CM | POA: Diagnosis not present

## 2021-04-13 NOTE — Patient Instructions (Signed)
Visit Information  PATIENT GOALS: Patient Care Plan: RNCM: Diabetes Type 2 (Adult)    Problem Identified: RNCM: Glycemic Management (Diabetes, Type 2)   Priority: Medium    Long-Range Goal: RNCM: Glycemic Management Optimized   Priority: Medium  Note:   Objective:  . Lab Results .  Component . Value . Date .   [name] HGBA1C . 6.6 . 02/04/2021 .    [name] Lab Results .  Component . Value . Date .   [name] CREATININE . 1.26 (H) . 02/04/2021 .   [name] BUN . 14 . 02/04/2021 .   [name] NA . 141 . 02/04/2021 .   . K . 4.9 . 02/04/2021 .   . CL . 106 . 02/04/2021 .   [name] CO2 . 15 (L) . 02/04/2021 .     [name] No results found for: EGFR Current Barriers:  [name] Knowledge Deficits related to basic Diabetes pathophysiology and self care/management . Knowledge Deficits related to medications used for management of diabetes . Limited Social Support . Unable to independently manage DM . Lacks social connections . Does not contact provider office for questions/concerns Case Manager Clinical Goal(s):  [name] Collaboration with Venita Lick, NP regarding development and update of comprehensive plan of care as evidenced by provider attestation and co-signature . Inter-disciplinary care team collaboration (see longitudinal plan of care) . Over the next 120 days, patient will demonstrate improved adherence to prescribed treatment plan for diabetes self care/management as evidenced by:  . daily monitoring and recording of CBG  . adherence to ADA/ carb modified diet . adherence to prescribed medication regimen Interventions:  . Provided education to patient about basic DM disease process. 02-09-2021: The patient saw the pcp recently and got a good report. Hemoglobin A1C elevated a little more but still <7.0. Review of plan of care. 04-13-2021: The patient states she was not happy.with her hemoglobin A1C results and has been working on eating better.  . Reviewed medications with patient and discussed importance of medication  adherence. 04-13-2021: Is compliant with medications and no new concerns  . Discussed plans with patient for ongoing care management follow up and provided patient with direct contact information for care management team . Provided patient with written educational materials related to hypo and hyperglycemia and importance of correct treatment. 04-13-2021: The patient denies any lows at this time. Denies any acute distress. Working on exercise routine and dietary changes  . Reviewed scheduled/upcoming provider appointments including: 05-06-2021 and 0840 am . Advised patient, providing education and rationale, to check cbg BID and record, calling pcp for findings outside established parameters.  04-13-2021: The patient checks blood sugars regularly. Knows how to manage highs and lows . Review of patient status, including review of consultants reports, relevant laboratory and other test results, and medications completed. Patient Goals/Self-Care Activities . Over the next 120 days, patient will:  - UNABLE to independently DM Self administers oral medications as prescribed Attends all scheduled provider appointments Checks blood sugars as prescribed and utilize hyper and hypoglycemia protocol as needed Adheres to prescribed ADA/carb modified - barriers to adherence to treatment plan identified - blood glucose readings reviewed - individualized medical nutrition therapy provided - mutual A1C goal set or reviewed - resources required to improve adherence to care identified - self-awareness of signs/symptoms of hypo or hyperglycemia encouraged - use of blood glucose monitoring log promoted Follow Up Plan: Telephone follow up appointment with care management team member scheduled for: 06-09-2021 at 1145 am   Task: RNCM:  Alleviate Barriers to Glycemic Management   Note:   Care Management Activities:    - barriers to adherence to treatment plan identified - blood glucose readings reviewed -  individualized medical nutrition therapy provided - mutual A1C goal set or reviewed - resources required to improve adherence to care identified - self-awareness of signs/symptoms of hypo or hyperglycemia encouraged - use of blood glucose monitoring log promoted       Patient Care Plan: RNCM: Hypertension (Adult)    Problem Identified: RNCM: Hypertension (Hypertension)   Priority: Medium    Long-Range Goal: RNCM: Hypertension Monitored   Priority: Medium  Note:   Objective:  . Last practice recorded BP readings:  . BP Readings from Last 3 Encounters: .  02/04/21 . 123/80 .  10/27/20 . 114/74 .  09/16/20 . 119/75 .     [name] Most recent eGFR/CrCl: No results found for: EGFR  No components found for: CRCL Current Barriers:  [name] Knowledge Deficits related to basic understanding of hypertension pathophysiology and self care management . Knowledge Deficits related to understanding of medications prescribed for management of hypertension . Limited Social Support . Unable to independently HTN . Lacks social connections Case Manager Clinical Goal(s):  [name] Over the next 120 days, patient will verbalize understanding of plan for hypertension management . Over the next 120 days, patient will attend all scheduled medical appointments: 05-06-2021 at 0840 am  . Over the next 120 days, patient will demonstrate improved adherence to prescribed treatment plan for hypertension as evidenced by taking all medications as prescribed, monitoring and recording blood pressure as directed, adhering to low sodium/DASH diet . Over the next 120 days, patient will demonstrate improved health management independence as evidenced by checking blood pressure as directed and notifying PCP if SBP>150 or DBP > 90, taking all medications as prescribe, and adhering to a low sodium diet as discussed. . Over the next 120 days, patient will verbalize basic understanding of hypertension disease process and self health management plan  as evidenced by compliance with medications, heart healthy/ADA diet, working with the CCM team to optimize health and well being Interventions:  . Collaboration with Venita Lick, NP regarding development and update of comprehensive plan of care as evidenced by provider attestation and co-signature . Inter-disciplinary care team collaboration (see longitudinal plan of care) . Evaluation of current treatment plan related to hypertension self management and patient's adherence to plan as established by provider. 02-09-2021: The patient saw the pcp recently and got a good report. The patient is wanting to lose weight and has set a goal of 15 pounds.  Thinks the goal may be a little high. Discussed small steps and making goals to work towards.  The patient is wanting to get out and walk.  She is active and overall happy with her health. Looking forward to warmer weather.  04-13-2021: The patient denies any issues with her blood pressure. She states she is doing well. Is dealing with some seasonal allergies and had a sinus infection but is better now. Says that she is staying in.  . Provided education to patient re: stroke prevention, s/s of heart attack and stroke, DASH diet, complications of uncontrolled blood pressure . Reviewed medications with patient and discussed importance of compliance . Discussed plans with patient for ongoing care management follow up and provided patient with direct contact information for care management team . Advised patient, providing education and rationale, to monitor blood pressure daily and record, calling PCP for findings outside established  parameters.  . Reviewed scheduled/upcoming provider appointments including: 05-06-2021 Patient Goals/Self-Care Activities . Over the next 120 days, patient will:  - Self administers medications as prescribed Attends all scheduled provider appointments Calls provider office for new concerns, questions, or BP outside discussed  parameters Checks BP and records as discussed Follows a low sodium diet/DASH diet - blood pressure trends reviewed - depression screen reviewed - home or ambulatory blood pressure monitoring encouraged Follow Up Plan: Telephone follow up appointment with care management team member scheduled for: 06-09-2021 at 1145 am   Task: RNCM: Identify and Monitor Blood Pressure Elevation   Note:   Care Management Activities:    - blood pressure trends reviewed - depression screen reviewed - home or ambulatory blood pressure monitoring encouraged       Patient Care Plan: RNCM: HLD Management    Problem Identified: RNCM: HLD   Priority: Medium    Long-Range Goal: RNCM: Management of HLD   Priority: Medium  Note:   Current Barriers:  . Poorly controlled hyperlipidemia, complicated by HTN, DM2 . Current antihyperlipidemic regimen: Lipitor 20 mg . Most recent lipid panel:  . Lab Results .  Component . Value . Date .   [name] CHOL . 137 . 02/04/2021 .   [name] CHOL . 150 . 10/27/2020 .   [name] CHOL . 141 . 03/04/2020 .   [name] Lab Results .  Component . Value . Date .   [name] HDL . 53 . 02/04/2021 .   [name] HDL . 59 . 10/27/2020 .   [name] HDL . 52 . 03/04/2020 .   [name] Lab Results .  Component . Value . Date .   [name] [place] . 60 . 02/04/2021 .   [name] [place] . 73 . 10/27/2020 .   [name] [place] . 62 . 03/04/2020 .   [name] Lab Results .  Component . Value . Date .   [name] TRIG . 136 . 02/04/2021 .   [name] TRIG . 97 . 10/27/2020 .   [name] TRIG . 160 (H) . 03/04/2020 .   [name] Lab Results .  Component . Value . Date .   [name] CHOLHDL . 2.5 . 07/10/2018 .   [name] No results found for: LDLDIRECT  . ASCVD risk enhancing conditions: age 49, DM, HTN, CKD, former smoker . Unable to independently manage HLD . Lacks social connections . Does not contact provider office for questions/concerns  RN Care Manager Clinical Goal(s):  [name] Over the next 120 days, patient will work with Consulting civil engineer, providers, and care team towards execution of optimized  self-health management plan . Over the next 120 days, patient will verbalize understanding of plan for effective management of HLD . Over the next 120 days, patient will work with [facility] and CCM team  to address needs related to HLD management  . Over the next 120  days, patient will attend all scheduled medical appointments: 05-06-2021 at 0840 am  Interventions: . Collaboration with Venita Lick, NP regarding development and update of comprehensive plan of care as evidenced by provider attestation and co-signature . Inter-disciplinary care team collaboration (see longitudinal plan of care) . Medication review performed; medication list updated in electronic medical record.  Bertram Savin care team collaboration (see longitudinal plan of care) . Referred to pharmacy team for assistance with HLD medication management . Evaluation of current treatment plan related to HLD and patient's adherence to plan as established by provider. 02-09-2021: Had recent visit with the pcp and lab work.  Good control of HLD at this time. 04-13-2021: The patient is compliant with a heart healthy/ADA diet. Denies any new concerns. States that she is excited about plans to go with her siblings to [place] this summer. She will see the pcp in June and then be ready for her trip in July.  . Advised patient to call the office for changes in condition or questions. Also to write down questions to have for pcp at her next visit.  [name] Provided education to patient re: dietary restrictions and following a heart healthy/ADA diet  . Discussed plans with patient for ongoing care management follow up and provided patient with direct contact information for care management team . Provided patient with DASH educational materials related to heart healthy diet    Patient Goals/Self-Care Activities: . Over the next 120 days, patient will:   - call for medicine refill 2 or 3 days before it runs out - call if I am sick and can't take my  medicine - keep a list of all the medicines I take; vitamins and herbals too - learn to read medicine labels - use a pillbox to sort medicine - use an alarm clock or phone to remind me to take my medicine - change to whole grain breads, cereal, pasta - drink 6 to 8 glasses of water each day - eat 3 to 5 servings of fruits and vegetables each day - eat 5 or 6 small meals each day - fill half the plate with nonstarchy vegetables - limit fast food meals to no more than 1 per week - manage portion size - prepare main meal at home 3 to 5 days each week - reduce red meat to 2 to 3 times a week - set a realistic goal - be open to making changes - I can manage, know and watch for signs of a heart attack - if I have chest pain, call for help - learn about small changes that will make a big difference - learn my personal risk factors   Follow Up Plan: Telephone follow up appointment with care management team member scheduled for: 06-09-2021 at 1145 am       Patient verbalizes understanding of instructions provided today and agrees to view in [place].   Telephone follow up appointment with care management team member scheduled for: 06-09-2021 at 1145 am  [place], MSN, [facility] Mobile: [phone]

## 2021-04-13 NOTE — Chronic Care Management (AMB) (Signed)
Chronic Care Management   CCM RN Visit Note  04/13/2021 Name: Kim Thomas MRN: [id number] DOB: 1956/11/03  Subjective: Kim Thomas is a 65 y.o. year old female who is a primary care patient of Cannady, [name], NP. The care management team was consulted for assistance with disease management and care coordination needs.    Engaged with patient by telephone for follow up visit in response to provider referral for case management and/or care coordination services.   Consent to Services:  The patient was given information about Chronic Care Management services, agreed to services, and gave verbal consent prior to initiation of services.  Please see initial visit note for detailed documentation.   Patient agreed to services and verbal consent obtained.   Assessment: Review of patient past medical history, allergies, medications, health status, including review of consultants reports, laboratory and other test data, was performed as part of comprehensive evaluation and provision of chronic care management services.   SDOH (Social Determinants of Health) assessments and interventions performed:    CCM Care Plan  Allergies  Allergen Reactions  . Morphine And Related Itching    It burned, and made patient itch and go out of her mind.  . Erythromycin     "Been so long ago," patient unable to remember reaction  . Hydrocodone-Acetaminophen Itching    Outpatient Encounter Medications as of 04/13/2021  Medication Sig Note  . acetaminophen (TYLENOL) 500 MG tablet Take 500 mg by mouth daily as needed. 07/13/2020: Taking one tablet twice a day.  [name] ascorbic acid (VITAMIN C) 1000 MG tablet Take by mouth.   . Aspirin (VAZALORE) 81 MG CAPS Take by mouth.   [name] atorvastatin (LIPITOR) 20 MG tablet TAKE 1 TABLET BY MOUTH  DAILY   . BLACK CURRANT SEED OIL PO Take 1,250 mg by mouth 2 (two) times daily.   . Blood Glucose Monitoring Suppl (ONE TOUCH ULTRA 2) w/Device KIT Use to check blood sugar 3-4 times  daily and document for visits. ICD E11.21   . CALCIUM CITRATE PO Take 1,000 mg by mouth daily.   . chlorhexidine (PERIDEX) 0.12 % solution SMARTSIG:By Mouth   . Cholecalciferol (VITAMIN D3) 2000 units TABS Take 2,000 Units by mouth daily.   [name] Co-Enzyme Q-10 100 MG CAPS Take 50 mg by mouth daily.   . cyclobenzaprine (FLEXERIL) 10 MG tablet Take 10 mg by mouth at bedtime. Take 1/2 tablet to 1 as needed  At bed time   . diclofenac sodium (VOLTAREN) 1 % GEL Apply topically.   [name] ALLERGY RELIEF, CETIRIZINE, 10 MG tablet Take 1 tablet by mouth once daily   . fluticasone (FLONASE) 50 MCG/ACT nasal spray USE 1 TO 2 SPRAY(S) IN EACH NOSTRIL TWICE DAILY   . gabapentin (NEURONTIN) 400 MG capsule TAKE 1 CAPSULE BY MOUTH 3  TIMES DAILY   . glipiZIDE (GLUCOTROL) 5 MG tablet TAKE ONE-HALF TABLET BY  MOUTH DAILY BEFORE  BREAKFAST   . JARDIANCE 25 MG TABS tablet TAKE 1 TABLET BY MOUTH  DAILY   . Lancets (ONETOUCH DELICA PLUS [id number]) MISC 1 each by Does not apply route 2 (two) times daily.   . Magnesium 400 MG TABS Take by mouth.   . Multiple Vitamin (MULTIVITAMIN) tablet Take 1 tablet by mouth daily.   . Olopatadine HCl (PATADAY OP) Apply 1 drop to eye daily.   . Omega-3 1000 MG CAPS Take 1 capsule by mouth daily.   [name] omeprazole (PRILOSEC) 20 MG capsule TAKE 1 CAPSULE BY MOUTH  DAILY   . ONETOUCH ULTRA test strip USE TO CHECK BLOOD SUGAR  DAILY   . Potassium 99 MG TABS Take by mouth.   . telmisartan (MICARDIS) 40 MG tablet Take 1 tablet (40 mg total) by mouth daily.   [name] triamcinolone cream (KENALOG) 0.1 % Apply 1 application topically 2 (two) times daily.   . vitamin E 100 UNIT capsule Take 100 Units by mouth daily.    No facility-administered encounter medications on file as of 04/13/2021.    Patient Active Problem List   Diagnosis Date Noted  . Morbid obesity ([place]) 07/17/2020  . Osteopenia of neck of left femur 03/04/2020  . Multinodular thyroid 01/02/2020  . Vitamin D deficiency 12/03/2019  .  Hyperlipidemia associated with type 2 diabetes mellitus ([place]) 04/03/2018  . Advanced care planning/counseling discussion 02/20/2017  . Elevated uric acid in blood 06/21/2016  . Allergic rhinitis 06/06/2016  . CKD stage 3 due to type 2 diabetes mellitus ([place]) 06/06/2016  . Type 2 diabetes mellitus with diabetic nephropathy ([place]) 05/25/2016  . Hypertension associated with diabetes ([place]) 05/25/2016  . Arthritis of hand, right 05/25/2016  . Sleep apnea 05/25/2016  . 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 Lab Results .  Component . Value . Date .   [name] HGBA1C . 6.6 . 02/04/2021 .    [name] Lab Results .  Component . Value . Date .   [name] CREATININE . 1.26 (H) . 02/04/2021 .   [name] BUN . 14 . 02/04/2021 .   [name] NA . 141 . 02/04/2021 .   . K . 4.9 . 02/04/2021 .   . CL . 106 . 02/04/2021 .   [name] CO2 . 15 (L) . 02/04/2021 .     [name] No results found for: EGFR Current Barriers:  [name] Knowledge Deficits related to basic Diabetes pathophysiology and self care/management . Knowledge Deficits related to medications used for management of diabetes . Limited Social Support . Unable to independently manage DM . Lacks social connections . Does not contact provider office for questions/concerns Case Manager Clinical Goal(s):  [name] Collaboration with Venita Lick, NP regarding development and update of comprehensive plan of care as evidenced by provider attestation and co-signature . Inter-disciplinary care team collaboration (see longitudinal plan of care) . Over the next 120 days, patient will demonstrate improved adherence to prescribed treatment plan for diabetes self care/management as  evidenced by:  . daily monitoring and recording of CBG  . adherence to ADA/ carb modified diet . adherence to prescribed medication regimen Interventions:  . Provided education to patient about basic DM disease process. 02-09-2021: The patient saw the pcp recently and got a good report. Hemoglobin A1C elevated a little more but still <7.0. Review of plan of care. 04-13-2021: The patient states she was not happy.with her hemoglobin A1C results and has been working on eating better.  . Reviewed medications with patient and discussed importance of medication adherence. 04-13-2021: Is compliant with medications and no new concerns  . Discussed plans with patient for ongoing care management follow up and provided patient with direct contact information for care management team . Provided patient  with written educational materials related to hypo and hyperglycemia and importance of correct treatment. 04-13-2021: The patient denies any lows at this time. Denies any acute distress. Working on exercise routine and dietary changes  . Reviewed scheduled/upcoming provider appointments including: 05-06-2021 and 0840 am . Advised patient, providing education and rationale, to check cbg BID and record, calling pcp for findings outside established parameters.  04-13-2021: The patient checks blood sugars regularly. Knows how to manage highs and lows . Review of patient status, including review of consultants reports, relevant laboratory and other test results, and medications completed. Patient Goals/Self-Care Activities . Over the next 120 days, patient will:  - UNABLE to independently DM Self administers oral medications as prescribed Attends all scheduled provider appointments Checks blood sugars as prescribed and utilize hyper and hypoglycemia protocol as needed Adheres to prescribed ADA/carb modified - barriers to adherence to treatment plan identified - blood glucose readings reviewed - individualized medical  nutrition therapy provided - mutual A1C goal set or reviewed - resources required to improve adherence to care identified - self-awareness of signs/symptoms of hypo or hyperglycemia encouraged - use of blood glucose monitoring log promoted Follow Up Plan: Telephone follow up appointment with care management team member scheduled for: 06-09-2021 at 1145 am   Care Plan : RNCM: Hypertension (Adult)  Updates made by Vanita Ingles since 04/13/2021 12:00 AM    Problem: RNCM: Hypertension (Hypertension)   Priority: Medium    Long-Range Goal: RNCM: Hypertension Monitored   Priority: Medium  Note:   Objective:  . Last practice recorded BP readings:  . BP Readings from Last 3 Encounters: .  02/04/21 . 123/80 .  10/27/20 . 114/74 .  09/16/20 . 119/75 .     [name] Most recent eGFR/CrCl: No results found for: EGFR  No components found for: CRCL Current Barriers:  [name] Knowledge Deficits related to basic understanding of hypertension pathophysiology and self care management . Knowledge Deficits related to understanding of medications prescribed for management of hypertension . Limited Social Support . Unable to independently HTN . Lacks social connections Case Manager Clinical Goal(s):  [name] Over the next 120 days, patient will verbalize understanding of plan for hypertension management . Over the next 120 days, patient will attend all scheduled medical appointments: 05-06-2021 at 0840 am  . Over the next 120 days, patient will demonstrate improved adherence to prescribed treatment plan for hypertension as evidenced by taking all medications as prescribed, monitoring and recording blood pressure as directed, adhering to low sodium/DASH diet . Over the next 120 days, patient will demonstrate improved health management independence as evidenced by checking blood pressure as directed and notifying PCP if SBP>150 or DBP > 90, taking all medications as prescribe, and adhering to a low sodium diet as  discussed. . Over the next 120 days, patient will verbalize basic understanding of hypertension disease process and self health management plan as evidenced by compliance with medications, heart healthy/ADA diet, working with the CCM team to optimize health and well being Interventions:  . Collaboration with Venita Lick, NP regarding development and update of comprehensive plan of care as evidenced by provider attestation and co-signature . Inter-disciplinary care team collaboration (see longitudinal plan of care) . Evaluation of current treatment plan related to hypertension self management and patient's adherence to plan as established by provider. 02-09-2021: The patient saw the pcp recently and got a good report. The patient is wanting to lose weight and has set a goal of 15 pounds.  Thinks  the goal may be a little high. Discussed small steps and making goals to work towards.  The patient is wanting to get out and walk.  She is active and overall happy with her health. Looking forward to warmer weather.  04-13-2021: The patient denies any issues with her blood pressure. She states she is doing well. Is dealing with some seasonal allergies and had a sinus infection but is better now. Says that she is staying in.  . Provided education to patient re: stroke prevention, s/s of heart attack and stroke, DASH diet, complications of uncontrolled blood pressure . Reviewed medications with patient and discussed importance of compliance . Discussed plans with patient for ongoing care management follow up and provided patient with direct contact information for care management team . Advised patient, providing education and rationale, to monitor blood pressure daily and record, calling PCP for findings outside established parameters.  . Reviewed scheduled/upcoming provider appointments including: 05-06-2021 Patient Goals/Self-Care Activities . Over the next 120 days, patient will:  - Self administers  medications as prescribed Attends all scheduled provider appointments Calls provider office for new concerns, questions, or BP outside discussed parameters Checks BP and records as discussed Follows a low sodium diet/DASH diet - blood pressure trends reviewed - depression screen reviewed - home or ambulatory blood pressure monitoring encouraged Follow Up Plan: Telephone follow up appointment with care management team member scheduled for: 06-09-2021 at 1145 am   Care Plan : RNCM: HLD Management  Updates made by Vanita Ingles since 04/13/2021 12:00 AM    Problem: RNCM: HLD   Priority: Medium    Long-Range Goal: RNCM: Management of HLD   Priority: Medium  Note:   Current Barriers:  . Poorly controlled hyperlipidemia, complicated by HTN, DM2 . Current antihyperlipidemic regimen: Lipitor 20 mg . Most recent lipid panel:  . Lab Results .  Component . Value . Date .   [name] CHOL . 137 . 02/04/2021 .   [name] CHOL . 150 . 10/27/2020 .   [name] CHOL . 141 . 03/04/2020 .   [name] Lab Results .  Component . Value . Date .   [name] HDL . 53 . 02/04/2021 .   [name] HDL . 59 . 10/27/2020 .   [name] HDL . 52 . 03/04/2020 .   [name] Lab Results .  Component . Value . Date .   [name] [place] . 60 . 02/04/2021 .   [name] [place] . 73 . 10/27/2020 .   [name] [place] . 62 . 03/04/2020 .   [name] Lab Results .  Component . Value . Date .   [name] TRIG . 136 . 02/04/2021 .   [name] TRIG . 97 . 10/27/2020 .   [name] TRIG . 160 (H) . 03/04/2020 .   [name] Lab Results .  Component . Value . Date .   [name] CHOLHDL . 2.5 . 07/10/2018 .   [name] No results found for: LDLDIRECT  . ASCVD risk enhancing conditions: age 51, DM, HTN, CKD, former smoker . Unable to independently manage HLD . Lacks social connections . Does not contact provider office for questions/concerns  RN Care Manager Clinical Goal(s):  [name] Over the next 120 days, patient will work with Consulting civil engineer, providers, and care team towards execution of optimized self-health management plan . Over the next 120  days, patient will verbalize understanding of plan for effective management of HLD . Over the next 120 days, patient will work with [facility] and CCM team  to  address needs related to HLD management  . Over the next 120  days, patient will attend all scheduled medical appointments: 05-06-2021 at 0840 am  Interventions: . Collaboration with Venita Lick, NP regarding development and update of comprehensive plan of care as evidenced by provider attestation and co-signature . Inter-disciplinary care team collaboration (see longitudinal plan of care) . Medication review performed; medication list updated in electronic medical record.  Bertram Savin care team collaboration (see longitudinal plan of care) . Referred to pharmacy team for assistance with HLD medication management . Evaluation of current treatment plan related to HLD and patient's adherence to plan as established by provider. 02-09-2021: Had recent visit with the pcp and lab work. Good control of HLD at this time. 04-13-2021: The patient is compliant with a heart healthy/ADA diet. Denies any new concerns. States that she is excited about plans to go with her siblings to [place] this summer. She will see the pcp in June and then be ready for her trip in July.  . Advised patient to call the office for changes in condition or questions. Also to write down questions to have for pcp at her next visit.  [name] Provided education to patient re: dietary restrictions and following a heart healthy/ADA diet  . Discussed plans with patient for ongoing care management follow up and provided patient with direct contact information for care management team . Provided patient with DASH educational materials related to heart healthy diet    Patient Goals/Self-Care Activities: . Over the next 120 days, patient will:   - call for medicine refill 2 or 3 days before it runs out - call if I am sick and can't take my medicine - keep a list of all the medicines I  take; vitamins and herbals too - learn to read medicine labels - use a pillbox to sort medicine - use an alarm clock or phone to remind me to take my medicine - change to whole grain breads, cereal, pasta - drink 6 to 8 glasses of water each day - eat 3 to 5 servings of fruits and vegetables each day - eat 5 or 6 small meals each day - fill half the plate with nonstarchy vegetables - limit fast food meals to no more than 1 per week - manage portion size - prepare main meal at home 3 to 5 days each week - reduce red meat to 2 to 3 times a week - set a realistic goal - be open to making changes - I can manage, know and watch for signs of a heart attack - if I have chest pain, call for help - learn about small changes that will make a big difference - learn my personal risk factors   Follow Up Plan: Telephone follow up appointment with care management team member scheduled for: 06-09-2021 at 1145 am       Plan:Telephone follow up appointment with care management team member scheduled for:  06-09-2021 at [place] am  Noreene Larsson RN, MSN, [facility] Mobile: [phone]

## 2021-04-14 ENCOUNTER — Other Ambulatory Visit: Payer: Self-pay | Admitting: Nurse Practitioner

## 2021-05-06 ENCOUNTER — Ambulatory Visit (INDEPENDENT_AMBULATORY_CARE_PROVIDER_SITE_OTHER): Payer: Medicare Other | Admitting: Nurse Practitioner

## 2021-05-06 ENCOUNTER — Encounter: Payer: Self-pay | Admitting: Nurse Practitioner

## 2021-05-06 ENCOUNTER — Other Ambulatory Visit: Payer: Self-pay

## 2021-05-06 VITALS — BP 121/71 | HR 94 | Temp 98.6°F | Wt 210.2 lb

## 2021-05-06 DIAGNOSIS — E1169 Type 2 diabetes mellitus with other specified complication: Secondary | ICD-10-CM | POA: Diagnosis not present

## 2021-05-06 DIAGNOSIS — I152 Hypertension secondary to endocrine disorders: Secondary | ICD-10-CM | POA: Diagnosis not present

## 2021-05-06 DIAGNOSIS — E1122 Type 2 diabetes mellitus with diabetic chronic kidney disease: Secondary | ICD-10-CM | POA: Diagnosis not present

## 2021-05-06 DIAGNOSIS — E559 Vitamin D deficiency, unspecified: Secondary | ICD-10-CM

## 2021-05-06 DIAGNOSIS — N183 Chronic kidney disease, stage 3 unspecified: Secondary | ICD-10-CM | POA: Diagnosis not present

## 2021-05-06 DIAGNOSIS — E1121 Type 2 diabetes mellitus with diabetic nephropathy: Secondary | ICD-10-CM

## 2021-05-06 DIAGNOSIS — M85852 Other specified disorders of bone density and structure, left thigh: Secondary | ICD-10-CM

## 2021-05-06 DIAGNOSIS — E785 Hyperlipidemia, unspecified: Secondary | ICD-10-CM

## 2021-05-06 DIAGNOSIS — E1159 Type 2 diabetes mellitus with other circulatory complications: Secondary | ICD-10-CM | POA: Diagnosis not present

## 2021-05-06 LAB — BAYER DCA HB A1C WAIVED: HB A1C (BAYER DCA - WAIVED): 6.3 % (ref <7.0)

## 2021-05-06 MED ORDER — OZEMPIC (0.25 OR 0.5 MG/DOSE) 2 MG/1.5ML ~~LOC~~ SOPN: 0.2500 mg | PEN_INJECTOR | SUBCUTANEOUS | 4 refills | Status: DC

## 2021-05-06 MED ORDER — TRIAMCINOLONE ACETONIDE 0.1 % EX CREA: 1.0000 "application " | TOPICAL_CREAM | Freq: Two times a day (BID) | CUTANEOUS | 2 refills | Status: DC

## 2021-05-06 NOTE — Assessment & Plan Note (Addendum)
Chronic, ongoing.  Below goal with BP today.  Recommend she check BP at home three mornings a week and document for provider.  Continue current medication regimen and adjust as needed.  CMP next visit.  Educated her on Telmisartan and kidney benefit.  Urine micro alb 10 and A:C <24 February 2021.  Focus on DASH diet at home.  Return in 3 months.

## 2021-05-06 NOTE — Progress Notes (Signed)
BP 121/71   Pulse 94   Temp 98.6 F (37 C) (Oral)   Wt 210 lb 3.2 oz (95.3 kg)   LMP  (LMP Unknown)   SpO2 95%   BMI 36.08 kg/m    Subjective:    Patient ID: Kim Thomas, female    DOB: 08-Jan-1956, 65 y.o.   MRN: [id number]  HPI: Kim Thomas is a 65 y.o. female  Chief Complaint  Patient presents with   Diabetes   Hypertension   Hyperlipidemia   VIt D   DIABETES Continues on Glipizide 2.5 MG (taking 3-4 times a week, not daily) and Jardiance 25 MG daily.  March 6.6%.  Her goal is to come completely off medication at some point.  Does endorse enjoying wheat Ritz crackers and chips occasionally.  She would like to try an injectable again, like Ozempic to help with her weight.  Has tried in past and tolerated, but felt it did not control blood sugars.  Hypoglycemic episodes:no Polydipsia/polyuria: no Visual disturbance: no Chest pain: no Paresthesias: no Glucose Monitoring: yes             Accucheck frequency: occasionally             Fasting glucose: 90 - 120 -- mainly <130             Post prandial:             Evening:              Before meals:  Taking Insulin?: no             Long acting insulin:             Short acting insulin: Blood Pressure Monitoring: not checking Retinal Examination: Up To Date Foot Exam: Up to Date Diabetic Education: Completed Pneumovax: refused Influenza: refused Aspirin: yes   HYPERTENSION / HYPERLIPIDEMIA Continues on Atorvastatin 20 MG daily and Telmisartan 40 MG. Her last LDL in March 60.  She reports wishing to have something for fluid retention at times because she notices at times getting swelling ankles.   Satisfied with current treatment? yes Duration of hypertension: chronic BP monitoring frequency: not checking BP range:  BP medication side effects: no Duration of hyperlipidemia: chronic Cholesterol medication side effects: no Cholesterol supplements: none Medication compliance: good compliance Aspirin: yes Recent  stressors: no Recurrent headaches: no Visual changes: no Palpitations: no Dyspnea: no Chest pain: no Lower extremity edema: no Dizzy/lightheaded: no  CHRONIC KIDNEY DISEASE Last labs in November -- CRT 1.26 and GFR 47, BUN 14  She saw nephrology, Dr. Thedore Mins, on 02/22/21 -- no changes made to regimen. CKD status: stable Medications renally dose: yes Previous renal evaluation: yes Pneumovax:  Not up to Date Influenza Vaccine:  Not up to Date   OSTEOPENIA Noted on DEXA 01/28/2020 with T-score at left femur -2.2.  Vit D level last check 49.6.   Satisfied with current treatment?: yes Past osteoporosis medications/treatments: none Adequate calcium & vitamin D: yes Weight bearing exercises: yes  Relevant past medical, surgical, family and social history reviewed and updated as indicated. Interim medical history since our last visit reviewed. Allergies and medications reviewed and updated.  Review of Systems  Constitutional:  Negative for activity change, appetite change, diaphoresis, fatigue and fever.  Respiratory:  Negative for cough, chest tightness and shortness of breath.   Cardiovascular:  Negative for chest pain, palpitations and leg swelling.  Gastrointestinal: Negative.   Endocrine: Negative for cold intolerance, heat intolerance,  polydipsia, polyphagia and polyuria.  Neurological: Negative.   Psychiatric/Behavioral: Negative.     Per HPI unless specifically indicated above     Objective:    BP 121/71   Pulse 94   Temp 98.6 F (37 C) (Oral)   Wt 210 lb 3.2 oz (95.3 kg)   LMP  (LMP Unknown)   SpO2 95%   BMI 36.08 kg/m   Wt Readings from Last 3 Encounters:  05/06/21 210 lb 3.2 oz (95.3 kg)  02/04/21 210 lb (95.3 kg)  10/27/20 208 lb 9.6 oz (94.6 kg)    Physical Exam Vitals and nursing note reviewed.  Constitutional:      General: She is awake. She is not in acute distress.    Appearance: She is well-developed. She is obese. She is not ill-appearing.  HENT:      Head: Normocephalic.     Right Ear: Hearing normal. No drainage.     Left Ear: Hearing normal. No drainage.  Eyes:     General: Lids are normal.        Right eye: No discharge.        Left eye: No discharge.     Conjunctiva/sclera: Conjunctivae normal.     Pupils: Pupils are equal, round, and reactive to light.  Neck:     Vascular: No carotid bruit.  Cardiovascular:     Rate and Rhythm: Normal rate and regular rhythm.     Heart sounds: Normal heart sounds. No murmur heard.   No gallop.  Pulmonary:     Effort: Pulmonary effort is normal. No accessory muscle usage or respiratory distress.     Breath sounds: Normal breath sounds.  Abdominal:     General: Bowel sounds are normal.     Palpations: Abdomen is soft.  Musculoskeletal:     Cervical back: Normal range of motion and neck supple.     Right lower leg: No edema.     Left lower leg: No edema.  Skin:    General: Skin is warm and dry.  Neurological:     Mental Status: She is alert and oriented to person, place, and time.  Psychiatric:        Attention and Perception: Attention normal.        Mood and Affect: Mood normal.        Speech: Speech normal.        Behavior: Behavior normal. Behavior is cooperative.        Thought Content: Thought content normal.   Diabetic Foot Exam - Simple   Simple Foot Form Visual Inspection No deformities, no ulcerations, no other skin breakdown bilaterally: Yes Sensation Testing Intact to touch and monofilament testing bilaterally: Yes Pulse Check Posterior Tibialis and Dorsalis pulse intact bilaterally: Yes Comments    Results for orders placed or performed in visit on 03/12/21  [facility] DIABETES EYE EXAM  Result Value Ref Range   HM Diabetic Eye Exam No Retinopathy No Retinopathy      Assessment & Plan:   Problem List Items Addressed This Visit       Cardiovascular and Mediastinum   Hypertension associated with diabetes (HCC)    Chronic, ongoing.  Below goal with BP today.   Recommend she check BP at home three mornings a week and document for provider.  Continue current medication regimen and adjust as needed.  CMP next visit.  Educated her on Telmisartan and kidney benefit.  Urine micro alb 10 and A:C <24 February 2021.  Focus on DASH  diet at home.  Return in 3 months.       Relevant Medications   Semaglutide,0.25 or 0.5MG /DOS, (OZEMPIC, 0.25 OR 0.5 MG/DOSE,) 2 MG/1.5ML SOPN     Endocrine   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  A1C today 6.3%, praised for success at maintaining below goal.  Urine ALB 04 February 2021, continue Telmisartan for kidney protection.  Continue Jardiance at this time + will add on Ozempic 0.25 MG weekly which will assist with diabetes and weight loss goal, instructed on use.  Recommend she stop Glipizide with this and possibly may d/c Jardiance in future, although this does offer kidney benefit and would prefer to leave on board. Continue increased diet focus and monitoring BS twice a day.  Recommend focus on healthy weight loss and exercise daily.  Return in 3 months.       Relevant Medications   Semaglutide,0.25 or 0.5MG /DOS, (OZEMPIC, 0.25 OR 0.5 MG/DOSE,) 2 MG/1.5ML SOPN   Other Relevant Orders   Bayer DCA Hb A1c Waived   CKD stage 3 due to type 2 diabetes mellitus (HCC)    Chronic, ongoing.  Continue Telmisartan for kidney protection.  Check CMP next visit.  Continue collaboration with nephrology, appreciate their input.       Relevant Medications   Semaglutide,0.25 or 0.5MG /DOS, (OZEMPIC, 0.25 OR 0.5 MG/DOSE,) 2 MG/1.5ML SOPN   Hyperlipidemia associated with type 2 diabetes mellitus (HCC)    Chronic, ongoing.  Continue current medication regimen and adjust as needed.  Lipid panel up to date, repeat next visit.       Relevant Medications   Semaglutide,0.25 or 0.5MG /DOS, (OZEMPIC, 0.25 OR 0.5 MG/DOSE,) 2 MG/1.5ML SOPN     Musculoskeletal and Integument   Osteopenia of neck of  left femur    Noted on recent DEXA -- continue supplements daily and check Vit D level.  Plan for repeat DEXA in 2026.  Discussed fall prevention.  Vit D level next visit.         Other   Vitamin D deficiency    Ongoing with underlying osteopenia, continue daily supplement and check Vit D level next visit.       Morbid obesity (HCC)    BMI 36.08 with T2DM, HTN.  Recommended eating smaller high protein, low fat meals more frequently and exercising 30 mins a day 5 times a week with a goal of 10-15lb weight loss in the next 3 months. Patient voiced their understanding and motivation to adhere to these recommendations.        Relevant Medications   sodium bicarbonate 650 MG tablet   Semaglutide,0.25 or 0.5MG /DOS, (OZEMPIC, 0.25 OR 0.5 MG/DOSE,) 2 MG/1.5ML SOPN     Follow up plan: Return in about 3 months (around 08/06/2021) for T2DM, HTN/HLD, CKD, VIT D, THYROID.

## 2021-05-06 NOTE — Assessment & Plan Note (Signed)
Noted on recent DEXA -- continue supplements daily and check Vit D level.  Plan for repeat DEXA in 2026.  Discussed fall prevention.  Vit D level next visit.

## 2021-05-06 NOTE — Assessment & Plan Note (Signed)
Ongoing with underlying osteopenia, continue daily supplement and check Vit D level next visit.

## 2021-05-06 NOTE — Assessment & Plan Note (Signed)
Chronic, ongoing.  Continue Telmisartan for kidney protection.  Check CMP next visit.  Continue collaboration with nephrology, appreciate their input.

## 2021-05-06 NOTE — Assessment & Plan Note (Signed)
Chronic, ongoing.  A1C today 6.3%, praised for success at maintaining below goal.  Urine ALB 04 February 2021, continue Telmisartan for kidney protection.  Continue Jardiance at this time + will add on Ozempic 0.25 MG weekly which will assist with diabetes and weight loss goal, instructed on use.  Recommend she stop Glipizide with this and possibly may d/c Jardiance in future, although this does offer kidney benefit and would prefer to leave on board. Continue increased diet focus and monitoring BS twice a day.  Recommend focus on healthy weight loss and exercise daily.  Return in 3 months.

## 2021-05-06 NOTE — Assessment & Plan Note (Signed)
Chronic, ongoing.  Continue current medication regimen and adjust as needed.  Lipid panel up to date, repeat next visit.

## 2021-05-06 NOTE — Assessment & Plan Note (Signed)
BMI 36.08 with T2DM, HTN.  Recommended eating smaller high protein, low fat meals more frequently and exercising 30 mins a day 5 times a week with a goal of 10-15lb weight loss in the next 3 months. Patient voiced their understanding and motivation to adhere to these recommendations.

## 2021-05-06 NOTE — Patient Instructions (Signed)
OZEMPIC: Start with 0.25 MG once a week injected into skin x 4 weeks, then increase to 0.5 MG weekly into skin.  Please try to stop using Glipizide if sugars are stable.  Continue Jardiance.  Diabetes Mellitus and Nutrition, Adult When you have diabetes, or diabetes mellitus, it is very important to have healthy eating habits because your blood sugar (glucose) levels are greatly affected by what you eat and drink. Eating healthy foods in the right amounts, at about the same times every day, can help you: Control your blood glucose. Lower your risk of heart disease. Improve your blood pressure. Reach or maintain a healthy weight. What can affect my meal plan? Every person with diabetes is different, and each person has different needs for a meal plan. Your health care provider may recommend that you work with a dietitian to make a meal plan that is best for you. Your meal plan may vary depending on factors such as: The calories you need. The medicines you take. Your weight. Your blood glucose, blood pressure, and cholesterol levels. Your activity level. Other health conditions you have, such as heart or kidney disease. How do carbohydrates affect me? Carbohydrates, also called carbs, affect your blood glucose level more than any other type of food. Eating carbs naturally raises the amount of glucose in your blood. Carb counting is a method for keeping track of how many carbs you eat. Counting carbs is important to keep your blood glucose at a healthy level, especially if you use insulin or take certain oral diabetes medicines. It is important to know how many carbs you can safely have in each meal. This is different for every person. Your dietitian can help you calculate how many carbs you should have at each meal and for each snack. How does alcohol affect me? Alcohol can cause a sudden decrease in blood glucose (hypoglycemia), especially if you use insulin or take certain oral diabetes  medicines. Hypoglycemia can be a life-threatening condition. Symptoms of hypoglycemia, such as sleepiness, dizziness, and confusion, are similar to symptoms of having too much alcohol. Do not drink alcohol if: Your health care provider tells you not to drink. You are pregnant, may be pregnant, or are planning to become pregnant. If you drink alcohol: Do not drink on an empty stomach. Limit how much you use to: 0-1 drink a day for women. 0-2 drinks a day for men. Be aware of how much alcohol is in your drink. In the [place] one drink equals one 12 oz bottle of beer (355 mL), one 5 oz glass of wine (148 mL), or one 1 oz glass of hard liquor (44 mL). Keep yourself hydrated with water, diet soda, or unsweetened iced tea. Keep in mind that regular soda, juice, and other mixers may contain a lot of sugar and must be counted as carbs. What are tips for following this plan? Reading food labels Start by checking the serving size on the "Nutrition Facts" label of packaged foods and drinks. The amount of calories, carbs, fats, and other nutrients listed on the label is based on one serving of the item. Many items contain more than one serving per package. Check the total grams (g) of carbs in one serving. You can calculate the number of servings of carbs in one serving by dividing the total carbs by 15. For example, if a food has 30 g of total carbs per serving, it would be equal to 2 servings of carbs. Check the number of grams (g)  of saturated fats and trans fats in one serving. Choose foods that have a low amount or none of these fats. Check the number of milligrams (mg) of salt (sodium) in one serving. Most people should limit total sodium intake to less than 2,300 mg per day. Always check the nutrition information of foods labeled as "low-fat" or "nonfat." These foods may be higher in added sugar or refined carbs and should be avoided. Talk to your dietitian to identify your daily goals for nutrients  listed on the label. Shopping Avoid buying canned, pre-made, or processed foods. These foods tend to be high in fat, sodium, and added sugar. Shop around the outside edge of the grocery store. This is where you will most often find fresh fruits and vegetables, bulk grains, fresh meats, and fresh dairy. Cooking Use low-heat cooking methods, such as baking, instead of high-heat cooking methods like deep frying. Cook using healthy oils, such as olive, canola, or sunflower oil. Avoid cooking with butter, cream, or high-fat meats. Meal planning Eat meals and snacks regularly, preferably at the same times every day. Avoid going long periods of time without eating. Eat foods that are high in fiber, such as fresh fruits, vegetables, beans, and whole grains. Talk with your dietitian about how many servings of carbs you can eat at each meal. Eat 4-6 oz (112-168 g) of lean protein each day, such as lean meat, chicken, fish, eggs, or tofu. One ounce (oz) of lean protein is equal to: 1 oz (28 g) of meat, chicken, or fish. 1 egg.  cup (62 g) of tofu. Eat some foods each day that contain healthy fats, such as avocado, nuts, seeds, and fish.   What foods should I eat? Fruits Berries. Apples. Oranges. Peaches. Apricots. Plums. Grapes. Mango. Papaya. Pomegranate. Kiwi. Cherries. Vegetables Lettuce. Spinach. Leafy greens, including kale, chard, collard greens, and mustard greens. Beets. Cauliflower. Cabbage. Broccoli. Carrots. Green beans. Tomatoes. Peppers. Onions. Cucumbers. Brussels sprouts. Grains Whole grains, such as whole-wheat or whole-grain bread, crackers, tortillas, cereal, and pasta. Unsweetened oatmeal. Quinoa. Brown or wild rice. Meats and other proteins Seafood. Poultry without skin. Lean cuts of poultry and beef. Tofu. Nuts. Seeds. Dairy Low-fat or fat-free dairy products such as milk, yogurt, and cheese. The items listed above may not be a complete list of foods and beverages you can eat.  Contact a dietitian for more information. What foods should I avoid? Fruits Fruits canned with syrup. Vegetables Canned vegetables. Frozen vegetables with butter or cream sauce. Grains Refined white flour and flour products such as bread, pasta, snack foods, and cereals. Avoid all processed foods. Meats and other proteins Fatty cuts of meat. Poultry with skin. Breaded or fried meats. Processed meat. Avoid saturated fats. Dairy Full-fat yogurt, cheese, or milk. Beverages Sweetened drinks, such as soda or iced tea. The items listed above may not be a complete list of foods and beverages you should avoid. Contact a dietitian for more information. Questions to ask a health care provider Do I need to meet with a diabetes educator? Do I need to meet with a dietitian? What number can I call if I have questions? When are the best times to check my blood glucose? Where to find more information: American Diabetes Association: diabetes.org Academy of Nutrition and Dietetics: www.eatright.[employer] of Diabetes and Digestive and Kidney Diseases: [employer].tn Association of Diabetes Care and Education Specialists: www.diabeteseducator.org Summary It is important to have healthy eating habits because your blood sugar (glucose) levels are greatly affected by  what you eat and drink. A healthy meal plan will help you control your blood glucose and maintain a healthy lifestyle. Your health care provider may recommend that you work with a dietitian to make a meal plan that is best for you. Keep in mind that carbohydrates (carbs) and alcohol have immediate effects on your blood glucose levels. It is important to count carbs and to use alcohol carefully. This information is not intended to replace advice given to you by your health care provider. Make sure you discuss any questions you have with your health care provider. Document Revised: 10/22/2019 Document Reviewed: 10/22/2019 Elsevier  Patient Education  2021 [employer].

## 2021-05-10 ENCOUNTER — Telehealth: Payer: Self-pay | Admitting: Nurse Practitioner

## 2021-05-10 NOTE — Telephone Encounter (Signed)
Pt is calling because [facility] rx is needing clarity on Semaglutide,0.25 or 0.5MG /DOS, (OZEMPIC, 0.25 OR 0.5 MG/DOSE,) 2 MG/1.5ML SOPN [[id number]] needing clarity on the sig that states -Sig: Inject 0.25 mg into the skin once a week. Start with 0.25 MG once a week injected into skin x 4 weeks, then increase to 0.5 MG weekly into skin.  CB- [phone] Please advise with the pt.

## 2021-05-10 NOTE — Telephone Encounter (Signed)
Scheduled 9/12

## 2021-05-10 NOTE — Telephone Encounter (Signed)
Please clarify instructions

## 2021-05-11 NOTE — Telephone Encounter (Signed)
Tried to call pharmacy, unable to speak to anyone.Will try again.

## 2021-05-11 NOTE — Telephone Encounter (Signed)
Form completed and faxed back

## 2021-06-09 ENCOUNTER — Ambulatory Visit (INDEPENDENT_AMBULATORY_CARE_PROVIDER_SITE_OTHER): Payer: Medicare Other | Admitting: General Practice

## 2021-06-09 ENCOUNTER — Telehealth: Payer: Medicare Other | Admitting: General Practice

## 2021-06-09 DIAGNOSIS — E1121 Type 2 diabetes mellitus with diabetic nephropathy: Secondary | ICD-10-CM

## 2021-06-09 DIAGNOSIS — E1159 Type 2 diabetes mellitus with other circulatory complications: Secondary | ICD-10-CM

## 2021-06-09 DIAGNOSIS — E1169 Type 2 diabetes mellitus with other specified complication: Secondary | ICD-10-CM

## 2021-06-09 DIAGNOSIS — I152 Hypertension secondary to endocrine disorders: Secondary | ICD-10-CM

## 2021-06-09 DIAGNOSIS — E785 Hyperlipidemia, unspecified: Secondary | ICD-10-CM

## 2021-06-09 NOTE — Patient Instructions (Signed)
Visit Information  PATIENT GOALS: Patient Care Plan: RNCM: Diabetes Type 2 (Adult)     Problem Identified: RNCM: Glycemic Management (Diabetes, Type 2)   Priority: Medium     Long-Range Goal: RNCM: Glycemic Management Optimized   Start Date: 12/15/2020  Expected End Date: 02/24/2022  This Visit's Progress: On track  Priority: Medium  Note:   Objective:  Lab Results  Component Value Date   HGBA1C 6.3 05/06/2021     Lab Results  Component Value Date   CREATININE 1.26 (H) 02/04/2021   BUN 14 02/04/2021   NA 141 02/04/2021   K 4.9 02/04/2021   CL 106 02/04/2021   CO2 15 (L) 02/04/2021     No results found for: EGFR Current Barriers:  Knowledge Deficits related to basic Diabetes pathophysiology and self care/management Knowledge Deficits related to medications used for management of diabetes Limited Social Support Unable to independently manage DM Lacks social connections Does not contact provider office for questions/concerns Case Manager Clinical Goal(s):  Collaboration with Marnee Guarneri T, NP regarding development and update of comprehensive plan of care as evidenced by provider attestation and co-signature Inter-disciplinary care team collaboration (see longitudinal plan of care) Over the next 120 days, patient will demonstrate improved adherence to prescribed treatment plan for diabetes self care/management as evidenced by:  daily monitoring and recording of CBG  adherence to ADA/ carb modified diet adherence to prescribed medication regimen Interventions:  Provided education to patient about basic DM disease process. 02-09-2021: The patient saw the pcp recently and got a good report. Hemoglobin A1C elevated a little more but still <7.0. Review of plan of care. 04-13-2021: The patient states she was not happy.with her hemoglobin A1C results and has been working on eating better. 06-09-2021: The patient states that she is hopeful that the [place] is going to really help her  with control of her diabetes and get her off of the trulicity and help her lose weight. She is doing well and excited to see how things go with her as she has been on this medication for about 3 weeks.  Reviewed medications with patient and discussed importance of medication adherence. 04-13-2021: Is compliant with medications and no new concerns. 06-09-2021: The patient states that she had to call the pharmacy to ask about the [place]. She was not doing it correctly at first. Now she is dialing it up right and taking 0.75m. She states she will go to the 0.575m  She has noticed that it has curbed her appetite and she is not eating snacks and other things like she was. She also started some "beets" supplement and hair, skin, and nails supplement.   Discussed plans with patient for ongoing care management follow up and provided patient with direct contact information for care management team Provided patient with written educational materials related to hypo and hyperglycemia and importance of correct treatment. 06-09-2021: The patient denies any lows at this time. Denies any acute distress. Working on exercise routine and dietary changes  Reviewed scheduled/upcoming provider appointments including: 08-09-2021 and 0840 am Advised patient, providing education and rationale, to check cbg BID and record, calling pcp for findings outside established parameters.  06-09-2021: The patient checks blood sugars regularly. Knows how to manage highs and lows Review of patient status, including review of consultants reports, relevant laboratory and other test results, and medications completed. Patient Goals/Self-Care Activities Over the next 120 days, patient will:  - UNABLE to independently DM Self administers oral medications as prescribed Attends all scheduled  provider appointments Checks blood sugars as prescribed and utilize hyper and hypoglycemia protocol as needed Adheres to prescribed ADA/carb modified -  barriers to adherence to treatment plan identified - blood glucose readings reviewed - individualized medical nutrition therapy provided - mutual A1C goal set or reviewed - resources required to improve adherence to care identified - self-awareness of signs/symptoms of hypo or hyperglycemia encouraged - use of blood glucose monitoring log promoted Follow Up Plan: Telephone follow up appointment with care management team member scheduled for: 08-18-2021 at 1145 am    Task: RNCM: Alleviate Barriers to Glycemic Management Completed 06/09/2021  Outcome: Positive  Note:   Care Management Activities:    - barriers to adherence to treatment plan identified - blood glucose readings reviewed - individualized medical nutrition therapy provided - mutual A1C goal set or reviewed - resources required to improve adherence to care identified - self-awareness of signs/symptoms of hypo or hyperglycemia encouraged - use of blood glucose monitoring log promoted        Patient Care Plan: RNCM: Hypertension (Adult)     Problem Identified: RNCM: Hypertension (Hypertension)   Priority: Medium     Long-Range Goal: RNCM: Hypertension Monitored   Start Date: 12/15/2020  Expected End Date: 02/24/2022  This Visit's Progress: On track  Priority: Medium  Note:   Objective:  Last practice recorded BP readings:  BP Readings from Last 3 Encounters:  05/06/21 121/71  02/04/21 123/80  10/27/20 114/74   Most recent eGFR/CrCl:  Lab Results  Component Value Date   EGFR 47 (L) 02/04/2021    No components found for: CRCL Current Barriers:  Knowledge Deficits related to basic understanding of hypertension pathophysiology and self care management Knowledge Deficits related to understanding of medications prescribed for management of hypertension Unable to independently manage HTN Lacks social connections Case Manager Clinical Goal(s):  patient will verbalize understanding of plan for hypertension  management patient will attend all scheduled medical appointments: 08-09-2021 at 840 am patient will demonstrate improved adherence to prescribed treatment plan for hypertension as evidenced by taking all medications as prescribed, monitoring and recording blood pressure as directed, adhering to low sodium/DASH diet patient will demonstrate improved health management independence as evidenced by checking blood pressure as directed and notifying PCP if SBP>150 or DBP > 90, taking all medications as prescribe, and adhering to a low sodium diet as discussed. patient will verbalize basic understanding of hypertension disease process and self health management plan as evidenced by compliance with heart healthy/ADA diet, compliance with medications, working with the CCM team to optimize health and well being.  Interventions:  Collaboration with Venita Lick, NP regarding development and update of comprehensive plan of care as evidenced by provider attestation and co-signature Inter-disciplinary care team collaboration (see longitudinal plan of care) Evaluation of current treatment plan related to hypertension self management and patient's adherence to plan as established by provider. 06-09-2021: The patient is checking her blood pressures at home and they are WNL. Denies any issues with HTN at this time. Will continue to monitor.  Provided education to patient re: stroke prevention, s/s of heart attack and stroke, DASH diet, complications of uncontrolled blood pressure Reviewed medications with patient and discussed importance of compliance Discussed plans with patient for ongoing care management follow up and provided patient with direct contact information for care management team Advised patient, providing education and rationale, to monitor blood pressure daily and record, calling PCP for findings outside established parameters.  Reviewed scheduled/upcoming provider appointments including: 08-09-2021 at  0840 am Self-Care Activities: - Self administers medications as prescribed Attends all scheduled provider appointments Calls provider office for new concerns, questions, or BP outside discussed parameters Checks BP and records as discussed Follows a low sodium diet/DASH diet Patient Goals: - check blood pressure 3 times per week - choose a place to take my blood pressure (home, clinic or office, retail store) - write blood pressure results in a log or diary - agree on reward when goals are met - agree to work together to make changes - ask questions to understand - have a family meeting to talk about healthy habits - learn about high blood pressure  Follow Up Plan: Telephone follow up appointment with care management team member scheduled for: 08-18-2021 at 11:45 am    Task: RNCM: Identify and Monitor Blood Pressure Elevation Completed 06/09/2021  Outcome: Positive  Note:   Care Management Activities:    - blood pressure trends reviewed - depression screen reviewed - home or ambulatory blood pressure monitoring encouraged        Patient Care Plan: RNCM: HLD Management     Problem Identified: RNCM: HLD   Priority: Medium     Long-Range Goal: RNCM: Management of HLD   Start Date: 12/15/2020  Expected End Date: 02/04/2022  This Visit's Progress: On track  Priority: Medium  Note:   Current Barriers:  Poorly controlled hyperlipidemia, complicated by HTN, DM2 Current antihyperlipidemic regimen: Lipitor 20 mg Most recent lipid panel:     Component Value Date/Time   CHOL 137 02/04/2021 0824   CHOL 150 05/28/2019 1018   TRIG 136 02/04/2021 0824   TRIG 92 05/28/2019 1018   HDL 53 02/04/2021 0824   CHOLHDL 2.5 07/10/2018 1503   VLDL 18 05/28/2019 1018   LDLCALC 60 02/04/2021 0824   ASCVD risk enhancing conditions: age >3, DM, HTN, former smoker Lacks Scientist, product/process development Does not contact provider office for Education officer, museum Clinical Goal(s):  patient  will work with Consulting civil engineer, providers, and care team towards execution of optimized self-health management plan patient will verbalize understanding of plan for effective management of HLD patient will work with [place], and pcp  to address needs related to effective management of HLD  patient will demonstrate a decrease in HLD  exacerbations patient will take all medications exactly as prescribed and will call provider for medication related questions patient will attend all scheduled medical appointments: 08-09-2021 at 840 am patient will demonstrate improved adherence to prescribed treatment plan for HLD patient will demonstrate improved health management independence patient will demonstrate understanding of rationale for each prescribed medication the patient will demonstrate ongoing self health care management ability Interventions: Collaboration with Venita Lick, NP regarding development and update of comprehensive plan of care as evidenced by provider attestation and co-signature Inter-disciplinary care team collaboration (see longitudinal plan of care) Medication review performed; medication list updated in electronic medical record.  Inter-disciplinary care team collaboration (see longitudinal plan of care) Referred to pharmacy team for assistance with HLD medication management Evaluation of current treatment plan related to HLD  and patient's adherence to plan as established by provider. Advised patient to call the office for changes in conditions, questions or concerns Provided education to patient re: effective management of HLD, heart healthy diet, and benefit of exercise for health and well being  Reviewed medications with patient and discussed compliance  Reviewed scheduled/upcoming provider appointments including: 08-18-2021 at 11:45 am Discussed plans with patient for ongoing care management follow up and provided  patient with direct contact information for care management  team Patient Goals/Self-Care Activities: - call for medicine refill 2 or 3 days before it runs out - call if I am sick and can't take my medicine - keep a list of all the medicines I take; vitamins and herbals too - learn to read medicine labels - use a pillbox to sort medicine - use an alarm clock or phone to remind me to take my medicine - change to whole grain breads, cereal, pasta - drink 6 to 8 glasses of water each day - eat 3 to 5 servings of fruits and vegetables each day - eat 5 or 6 small meals each day - eat fish at least once per week - fill half the plate with nonstarchy vegetables - limit fast food meals to no more than 1 per week - manage portion size - prepare main meal at home 3 to 5 days each week - read food labels for fat, fiber, carbohydrates and portion size - be open to making changes - I can manage, know and watch for signs of a heart attack - if I have chest pain, call for help - learn about small changes that will make a big difference - learn my personal risk factors  Follow Up Plan: Telephone follow up appointment with care management team member scheduled for:08-18-2021 at 11:45 am      Task: Identify Deficit and [place] Completed 06/09/2021  Outcome: Positive  Note:   Care Management Activities:    - education plan reviewed and/or amended - empathy and reassurance conveyed - family/caregiver participation in learning encouraged - privacy ensured - questions encouraged - readiness to learn monitored    Notes:        Patient verbalizes understanding of instructions provided today and agrees to view in [place].   Telephone follow up appointment with care management team member scheduled for: 08-18-2021 at 11:45 am  Noreene Larsson RN, MSN, [facility] Mobile: [phone]

## 2021-06-09 NOTE — Chronic Care Management (AMB) (Signed)
Chronic Care Management   CCM RN Visit Note  06/09/2021 Name: Kim Thomas MRN: [id number] DOB: 1956-08-25  Subjective: Kim Thomas is a 65 y.o. year old female who is a primary care patient of Cannady, [name], NP. The care management team was consulted for assistance with disease management and care coordination needs.    Engaged with patient by telephone for follow up visit in response to provider referral for case management and/or care coordination services.   Consent to Services:  The patient was given information about Chronic Care Management services, agreed to services, and gave verbal consent prior to initiation of services.  Please see initial visit note for detailed documentation.   Patient agreed to services and verbal consent obtained.   Assessment: Review of patient past medical history, allergies, medications, health status, including review of consultants reports, laboratory and other test data, was performed as part of comprehensive evaluation and provision of chronic care management services.   SDOH (Social Determinants of Health) assessments and interventions performed:    CCM Care Plan  Allergies  Allergen Reactions   Morphine And Related Itching    It burned, and made patient itch and go out of her mind.   Erythromycin     "Been so long ago," patient unable to remember reaction   Hydrocodone-Acetaminophen Itching    Outpatient Encounter Medications as of 06/09/2021  Medication Sig Note   acetaminophen (TYLENOL) 500 MG tablet Take 500 mg by mouth daily as needed. 07/13/2020: Taking one tablet twice a day.   ascorbic acid (VITAMIN C) 1000 MG tablet Take by mouth.    Aspirin (VAZALORE) 81 MG CAPS Take by mouth.    atorvastatin (LIPITOR) 20 MG tablet TAKE 1 TABLET BY MOUTH  DAILY    Blood Glucose Monitoring Suppl (ONE TOUCH ULTRA 2) w/Device KIT Use to check blood sugar 3-4 times daily and document for visits. ICD E11.21    CALCIUM CITRATE PO Take 1,000 mg by  mouth daily.    Cholecalciferol (VITAMIN D3) 2000 units TABS Take 2,000 Units by mouth daily.    Co-Enzyme Q-10 100 MG CAPS Take 50 mg by mouth daily.    cyclobenzaprine (FLEXERIL) 10 MG tablet Take 10 mg by mouth at bedtime. Take 1/2 tablet to 1 as needed  At bed time    diclofenac sodium (VOLTAREN) 1 % GEL Apply topically.    EQ ALLERGY RELIEF, CETIRIZINE, 10 MG tablet Take 1 tablet by mouth once daily    fluticasone (FLONASE) 50 MCG/ACT nasal spray USE 1 TO 2 SPRAY(S) IN EACH NOSTRIL TWICE DAILY    gabapentin (NEURONTIN) 400 MG capsule TAKE 1 CAPSULE BY MOUTH 3  TIMES DAILY    glipiZIDE (GLUCOTROL) 5 MG tablet TAKE ONE-HALF TABLET BY  MOUTH DAILY BEFORE  BREAKFAST    JARDIANCE 25 MG TABS tablet TAKE 1 TABLET BY MOUTH  DAILY    Lancets (ONETOUCH DELICA PLUS [id number]) MISC 1 each by Does not apply route 2 (two) times daily.    Magnesium 400 MG TABS Take by mouth.    Multiple Vitamin (MULTIVITAMIN) tablet Take 1 tablet by mouth daily.    Omega-3 1000 MG CAPS Take 1 capsule by mouth daily.    omeprazole (PRILOSEC) 20 MG capsule TAKE 1 CAPSULE BY MOUTH  DAILY    ONETOUCH ULTRA test strip USE TO CHECK BLOOD SUGAR  DAILY    Potassium 99 MG TABS Take by mouth.    Semaglutide,0.25 or 0.5MG/DOS, (OZEMPIC, 0.25 OR 0.5 MG/DOSE,) 2 MG/1.5ML SOPN  Inject 0.25 mg into the skin once a week. Start with 0.25 MG once a week injected into skin x 4 weeks, then increase to 0.5 MG weekly into skin.    sodium bicarbonate 650 MG tablet Take by mouth.    telmisartan (MICARDIS) 40 MG tablet Take 1 tablet (40 mg total) by mouth daily.    triamcinolone cream (KENALOG) 0.1 % Apply 1 application topically 2 (two) times daily.    vitamin E 100 UNIT capsule Take 100 Units by mouth daily.    No facility-administered encounter medications on file as of 06/09/2021.    Patient Active Problem List   Diagnosis Date Noted   Morbid obesity ([place]) 07/17/2020   Osteopenia of neck of left femur 03/04/2020   Multinodular thyroid  01/02/2020   Vitamin D deficiency 12/03/2019   Hyperlipidemia associated with type 2 diabetes mellitus ([place]) 04/03/2018   Advanced care planning/counseling discussion 02/20/2017   Elevated uric acid in blood 06/21/2016   Allergic rhinitis 06/06/2016   CKD stage 3 due to type 2 diabetes mellitus ([place]) 06/06/2016   Type 2 diabetes mellitus with diabetic nephropathy ([place]) 05/25/2016   Hypertension associated with diabetes ([place]) 05/25/2016   Arthritis of hand, right 05/25/2016   Sleep apnea 05/25/2016   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    Conditions to be addressed/monitored:HTN, HLD, and DMII  Care Plan : RNCM: Diabetes Type 2 (Adult)  Updates made by Vanita Ingles since 06/09/2021 12:00 AM     Problem: RNCM: Glycemic Management (Diabetes, Type 2)   Priority: Medium     Long-Range Goal: RNCM: Glycemic Management Optimized   Start Date: 12/15/2020  Expected End Date: 02/24/2022  This Visit's Progress: On track  Priority: Medium  Note:   Objective:  Lab Results  Component Value Date   HGBA1C 6.3 05/06/2021     Lab Results  Component Value Date   CREATININE 1.26 (H) 02/04/2021   BUN 14 02/04/2021   NA 141 02/04/2021   K 4.9 02/04/2021   CL 106 02/04/2021   CO2 15 (L) 02/04/2021     No results found for: EGFR Current Barriers:  Knowledge Deficits related to basic Diabetes pathophysiology and self care/management Knowledge Deficits related to medications used for management of diabetes Limited Social Support Unable to independently manage DM Lacks social connections Does not contact provider office for questions/concerns Case Manager Clinical Goal(s):  Collaboration with Marnee Guarneri T, NP regarding development and update of comprehensive plan of care as evidenced by provider attestation and co-signature Inter-disciplinary care team collaboration (see longitudinal plan of care) Over the next 120 days, patient will demonstrate improved adherence to prescribed treatment plan  for diabetes self care/management as evidenced by:  daily monitoring and recording of CBG  adherence to ADA/ carb modified diet adherence to prescribed medication regimen Interventions:  Provided education to patient about basic DM disease process. 02-09-2021: The patient saw the pcp recently and got a good report. Hemoglobin A1C elevated a little more but still <7.0. Review of plan of care. 04-13-2021: The patient states she was not happy.with her hemoglobin A1C results and has been working on eating better. 06-09-2021: The patient states that she is hopeful that the [place] is going to really help her with control of her diabetes and get her off of the trulicity and help her lose weight. She is doing well and excited to see how things go with her as she has been on this medication for about 3 weeks.  Reviewed medications with patient and discussed  importance of medication adherence. 04-13-2021: Is compliant with medications and no new concerns. 06-09-2021: The patient states that she had to call the pharmacy to ask about the [place]. She was not doing it correctly at first. Now she is dialing it up right and taking 0.37m. She states she will go to the 0.579m  She has noticed that it has curbed her appetite and she is not eating snacks and other things like she was. She also started some "beets" supplement and hair, skin, and nails supplement.   Discussed plans with patient for ongoing care management follow up and provided patient with direct contact information for care management team Provided patient with written educational materials related to hypo and hyperglycemia and importance of correct treatment. 06-09-2021: The patient denies any lows at this time. Denies any acute distress. Working on exercise routine and dietary changes  Reviewed scheduled/upcoming provider appointments including: 08-09-2021 and 0840 am Advised patient, providing education and rationale, to check cbg BID and record, calling pcp  for findings outside established parameters.  06-09-2021: The patient checks blood sugars regularly. Knows how to manage highs and lows Review of patient status, including review of consultants reports, relevant laboratory and other test results, and medications completed. Patient Goals/Self-Care Activities Over the next 120 days, patient will:  - UNABLE to independently DM Self administers oral medications as prescribed Attends all scheduled provider appointments Checks blood sugars as prescribed and utilize hyper and hypoglycemia protocol as needed Adheres to prescribed ADA/carb modified - barriers to adherence to treatment plan identified - blood glucose readings reviewed - individualized medical nutrition therapy provided - mutual A1C goal set or reviewed - resources required to improve adherence to care identified - self-awareness of signs/symptoms of hypo or hyperglycemia encouraged - use of blood glucose monitoring log promoted Follow Up Plan: Telephone follow up appointment with care management team member scheduled for: 08-18-2021 at 1145 am    Task: RNCM: Alleviate Barriers to Glycemic Management Completed 06/09/2021  Outcome: Positive  Note:   Care Management Activities:    - barriers to adherence to treatment plan identified - blood glucose readings reviewed - individualized medical nutrition therapy provided - mutual A1C goal set or reviewed - resources required to improve adherence to care identified - self-awareness of signs/symptoms of hypo or hyperglycemia encouraged - use of blood glucose monitoring log promoted        Care Plan : RNCM: Hypertension (Adult)  Updates made by TaVanita Inglesince 06/09/2021 12:00 AM     Problem: RNCM: Hypertension (Hypertension)   Priority: Medium     Long-Range Goal: RNCM: Hypertension Monitored   Start Date: 12/15/2020  Expected End Date: 02/24/2022  This Visit's Progress: On track  Priority: Medium  Note:   Objective:   Last practice recorded BP readings:  BP Readings from Last 3 Encounters:  05/06/21 121/71  02/04/21 123/80  10/27/20 114/74   Most recent eGFR/CrCl:  Lab Results  Component Value Date   EGFR 47 (L) 02/04/2021    No components found for: CRCL Current Barriers:  Knowledge Deficits related to basic understanding of hypertension pathophysiology and self care management Knowledge Deficits related to understanding of medications prescribed for management of hypertension Unable to independently manage HTN Lacks social connections Case Manager Clinical Goal(s):  patient will verbalize understanding of plan for hypertension management patient will attend all scheduled medical appointments: 08-09-2021 at 840 am patient will demonstrate improved adherence to prescribed treatment plan for hypertension as evidenced by taking all medications as  prescribed, monitoring and recording blood pressure as directed, adhering to low sodium/DASH diet patient will demonstrate improved health management independence as evidenced by checking blood pressure as directed and notifying PCP if SBP>150 or DBP > 90, taking all medications as prescribe, and adhering to a low sodium diet as discussed. patient will verbalize basic understanding of hypertension disease process and self health management plan as evidenced by compliance with heart healthy/ADA diet, compliance with medications, working with the CCM team to optimize health and well being.  Interventions:  Collaboration with Venita Lick, NP regarding development and update of comprehensive plan of care as evidenced by provider attestation and co-signature Inter-disciplinary care team collaboration (see longitudinal plan of care) Evaluation of current treatment plan related to hypertension self management and patient's adherence to plan as established by provider. 06-09-2021: The patient is checking her blood pressures at home and they are WNL. Denies any issues  with HTN at this time. Will continue to monitor.  Provided education to patient re: stroke prevention, s/s of heart attack and stroke, DASH diet, complications of uncontrolled blood pressure Reviewed medications with patient and discussed importance of compliance Discussed plans with patient for ongoing care management follow up and provided patient with direct contact information for care management team Advised patient, providing education and rationale, to monitor blood pressure daily and record, calling PCP for findings outside established parameters.  Reviewed scheduled/upcoming provider appointments including: 08-09-2021 at [place] am Self-Care Activities: - Self administers medications as prescribed Attends all scheduled provider appointments Calls provider office for new concerns, questions, or BP outside discussed parameters Checks BP and records as discussed Follows a low sodium diet/DASH diet Patient Goals: - check blood pressure 3 times per week - choose a place to take my blood pressure (home, clinic or office, retail store) - write blood pressure results in a log or diary - agree on reward when goals are met - agree to work together to make changes - ask questions to understand - have a family meeting to talk about healthy habits - learn about high blood pressure  Follow Up Plan: Telephone follow up appointment with care management team member scheduled for: 08-18-2021 at 11:45 am    Task: RNCM: Identify and Monitor Blood Pressure Elevation Completed 06/09/2021  Outcome: Positive  Note:   Care Management Activities:    - blood pressure trends reviewed - depression screen reviewed - home or ambulatory blood pressure monitoring encouraged        Care Plan : RNCM: HLD Management  Updates made by Vanita Ingles since 06/09/2021 12:00 AM     Problem: RNCM: HLD   Priority: Medium     Long-Range Goal: RNCM: Management of HLD   Start Date: 12/15/2020  Expected End Date:  02/04/2022  This Visit's Progress: On track  Priority: Medium  Note:   Current Barriers:  Poorly controlled hyperlipidemia, complicated by HTN, DM2 Current antihyperlipidemic regimen: Lipitor 20 mg Most recent lipid panel:     Component Value Date/Time   CHOL 137 02/04/2021 0824   CHOL 150 05/28/2019 1018   TRIG 136 02/04/2021 0824   TRIG 92 05/28/2019 1018   HDL 53 02/04/2021 0824   CHOLHDL 2.5 07/10/2018 1503   VLDL 18 05/28/2019 1018   [place] 60 02/04/2021 0824   ASCVD risk enhancing conditions: age >8, DM, HTN, former smoker Lacks Scientist, product/process development Does not contact provider office for Education officer, museum Clinical Goal(s):  patient will work with Consulting civil engineer, providers, and  care team towards execution of optimized self-health management plan patient will verbalize understanding of plan for effective management of HLD patient will work with [facility], and pcp  to address needs related to effective management of HLD  patient will demonstrate a decrease in HLD  exacerbations patient will take all medications exactly as prescribed and will call provider for medication related questions patient will attend all scheduled medical appointments: 08-09-2021 at 840 am patient will demonstrate improved adherence to prescribed treatment plan for HLD patient will demonstrate improved health management independence patient will demonstrate understanding of rationale for each prescribed medication the patient will demonstrate ongoing self health care management ability Interventions: Collaboration with Venita Lick, NP regarding development and update of comprehensive plan of care as evidenced by provider attestation and co-signature Inter-disciplinary care team collaboration (see longitudinal plan of care) Medication review performed; medication list updated in electronic medical record.  Inter-disciplinary care team collaboration (see longitudinal plan of care) Referred  to pharmacy team for assistance with HLD medication management Evaluation of current treatment plan related to HLD  and patient's adherence to plan as established by provider. Advised patient to call the office for changes in conditions, questions or concerns Provided education to patient re: effective management of HLD, heart healthy diet, and benefit of exercise for health and well being  Reviewed medications with patient and discussed compliance  Reviewed scheduled/upcoming provider appointments including: 08-18-2021 at 11:45 am Discussed plans with patient for ongoing care management follow up and provided patient with direct contact information for care management team Patient Goals/Self-Care Activities: - call for medicine refill 2 or 3 days before it runs out - call if I am sick and can't take my medicine - keep a list of all the medicines I take; vitamins and herbals too - learn to read medicine labels - use a pillbox to sort medicine - use an alarm clock or phone to remind me to take my medicine - change to whole grain breads, cereal, pasta - drink 6 to 8 glasses of water each day - eat 3 to 5 servings of fruits and vegetables each day - eat 5 or 6 small meals each day - eat fish at least once per week - fill half the plate with nonstarchy vegetables - limit fast food meals to no more than 1 per week - manage portion size - prepare main meal at home 3 to 5 days each week - read food labels for fat, fiber, carbohydrates and portion size - be open to making changes - I can manage, know and watch for signs of a heart attack - if I have chest pain, call for help - learn about small changes that will make a big difference - learn my personal risk factors  Follow Up Plan: Telephone follow up appointment with care management team member scheduled for:08-18-2021 at 11:45 am      Task: Identify Deficit and [place] Completed 06/09/2021  Outcome: Positive  Note:    Care Management Activities:    - education plan reviewed and/or amended - empathy and reassurance conveyed - family/caregiver participation in learning encouraged - privacy ensured - questions encouraged - readiness to learn monitored    Notes:      Plan:Telephone follow up appointment with care management team member scheduled for:  08-18-2021 at 11:45 am  Noreene Larsson RN, MSN, [facility] Mobile: [phone]

## 2021-06-15 ENCOUNTER — Telehealth: Payer: Self-pay

## 2021-07-14 ENCOUNTER — Ambulatory Visit (INDEPENDENT_AMBULATORY_CARE_PROVIDER_SITE_OTHER): Payer: Medicare Other | Admitting: Family Medicine

## 2021-07-14 ENCOUNTER — Other Ambulatory Visit: Payer: Self-pay

## 2021-07-14 ENCOUNTER — Encounter: Payer: Self-pay | Admitting: Family Medicine

## 2021-07-14 VITALS — BP 113/74 | HR 85 | Temp 98.2°F | Ht 64.02 in | Wt 203.0 lb

## 2021-07-14 DIAGNOSIS — M5442 Lumbago with sciatica, left side: Secondary | ICD-10-CM | POA: Diagnosis not present

## 2021-07-14 MED ORDER — KETOROLAC TROMETHAMINE 60 MG/2ML IM SOLN
60.0000 mg | Freq: Once | INTRAMUSCULAR | Status: AC
  Administered 2021-07-14: 60 mg via INTRAMUSCULAR

## 2021-07-14 MED ORDER — NAPROXEN 500 MG PO TABS: 500.0000 mg | ORAL_TABLET | Freq: Two times a day (BID) | ORAL | 0 refills | Status: DC

## 2021-07-14 MED ORDER — BACLOFEN 10 MG PO TABS
10.0000 mg | ORAL_TABLET | Freq: Every evening | ORAL | 0 refills | Status: DC | PRN
Start: 2021-07-14 — End: 2021-12-12

## 2021-07-14 NOTE — Progress Notes (Signed)
BP 113/74   Pulse 85   Temp 98.2 F (36.8 C) (Oral)   Ht 5' 4.02" (1.626 m)   Wt 203 lb (92.1 kg)   LMP  (LMP Unknown)   SpO2 97%   BMI 34.83 kg/m    Subjective:    Patient ID: Kim Thomas, female    DOB: Apr 21, 1956, 65 y.o.   MRN: [id number]  HPI: Kim Thomas is a 65 y.o. female  Chief Complaint  Patient presents with   Pulled Muscle    Left side of buttock x 1 1/2 weeks ago. Walking helps the pain go away, sitting is what causes the pain increase. Has been taking 1500mg  Tylenol every 3 to 4 hrs daily for past 3 days.    BACK PAIN Duration: 1.5 weeks Mechanism of injury: lifting Location: Left and low back Onset: sudden Severity: moderate Quality: sharp Frequency: intermittent, with sitting Radiation: L leg above the knee Aggravating factors: lifting, movement, walking, laying, bending, and prolonged sitting Alleviating factors: rest, ice, heat, laying, and APAP Status: fluctuating Treatments attempted: rest, ice, heat, and APAP  Relief with NSAIDs?: No NSAIDs Taken Nighttime pain:  no Paresthesias / decreased sensation:  no Bowel / bladder incontinence:  no Fevers:  no Dysuria / urinary frequency:  no   Relevant past medical, surgical, family and social history reviewed and updated as indicated. Interim medical history since our last visit reviewed. Allergies and medications reviewed and updated.  Review of Systems  Constitutional: Negative.   Respiratory: Negative.    Cardiovascular: Negative.   Gastrointestinal: Negative.   Musculoskeletal:  Positive for back pain and myalgias. Negative for arthralgias, gait problem, joint swelling, neck pain and neck stiffness.  Skin: Negative.   Neurological: Negative.   Psychiatric/Behavioral: Negative.     Per HPI unless specifically indicated above     Objective:    BP 113/74   Pulse 85   Temp 98.2 F (36.8 C) (Oral)   Ht 5' 4.02" (1.626 m)   Wt 203 lb (92.1 kg)   LMP  (LMP Unknown)   SpO2 97%   BMI  34.83 kg/m   Wt Readings from Last 3 Encounters:  07/14/21 203 lb (92.1 kg)  05/06/21 210 lb 3.2 oz (95.3 kg)  02/04/21 210 lb (95.3 kg)    Physical Exam Vitals and nursing note reviewed.  Constitutional:      General: She is not in acute distress.    Appearance: Normal appearance. She is not ill-appearing, toxic-appearing or diaphoretic.  HENT:     Head: Normocephalic and atraumatic.     Right Ear: External ear normal.     Left Ear: External ear normal.     Nose: Nose normal.     Mouth/Throat:     Mouth: Mucous membranes are moist.     Pharynx: Oropharynx is clear.  Eyes:     General: No scleral icterus.       Right eye: No discharge.        Left eye: No discharge.     Extraocular Movements: Extraocular movements intact.     Conjunctiva/sclera: Conjunctivae normal.     Pupils: Pupils are equal, round, and reactive to light.  Cardiovascular:     Rate and Rhythm: Normal rate and regular rhythm.     Pulses: Normal pulses.     Heart sounds: Normal heart sounds. No murmur heard.   No friction rub. No gallop.  Pulmonary:     Effort: Pulmonary effort is normal. No respiratory distress.  Breath sounds: Normal breath sounds. No stridor. No wheezing, rhonchi or rales.  Chest:     Chest wall: No tenderness.  Musculoskeletal:        General: Normal range of motion.     Cervical back: Normal range of motion and neck supple.  Skin:    General: Skin is warm and dry.     Capillary Refill: Capillary refill takes less than 2 seconds.     Coloration: Skin is not jaundiced or pale.     Findings: No bruising, erythema, lesion or rash.  Neurological:     General: No focal deficit present.     Mental Status: She is alert and oriented to person, place, and time. Mental status is at baseline.  Psychiatric:        Mood and Affect: Mood normal.        Behavior: Behavior normal.        Thought Content: Thought content normal.        Judgment: Judgment normal.    Results for orders  placed or performed in visit on 05/06/21  Bayer DCA Hb A1c Waived  Result Value Ref Range   HB A1C (BAYER DCA - WAIVED) 6.3 <7.0 %      Assessment & Plan:   Problem List Items Addressed This Visit   None Visit Diagnoses     Acute left-sided low back pain with left-sided sciatica    -  Primary   Will treat with toradol today, short course of naproxen starting tomorrow, baclofen and stretches. Follow up with PCP in a couple of weeks. Call with concerns.   Relevant Medications   ketorolac (TORADOL) injection 60 mg (Start on 07/14/2021 10:45 AM)   baclofen (LIORESAL) 10 MG tablet   naproxen (NAPROSYN) 500 MG tablet        Follow up plan: Return As scheduled.

## 2021-07-22 ENCOUNTER — Other Ambulatory Visit: Payer: Self-pay | Admitting: Nurse Practitioner

## 2021-07-22 DIAGNOSIS — I10 Essential (primary) hypertension: Secondary | ICD-10-CM

## 2021-07-22 NOTE — Telephone Encounter (Signed)
  Notes to clinic:  Patient has appt on 08/09/2021 for follow up Bp Looks like last filled on 03/17/2021 but should have been out in July Review for refill   Requested Prescriptions  Pending Prescriptions Disp Refills   telmisartan (MICARDIS) 40 MG tablet [Pharmacy Med Name: TELMISARTAN  40MG   TAB] 90 tablet 3    Sig: TAKE 1 TABLET BY MOUTH  DAILY     Cardiovascular:  Angiotensin Receptor Blockers Failed - 07/22/2021  1:58 PM      Failed - Cr in normal range and within 180 days    Creatinine, Ser  Date Value Ref Range Status  02/04/2021 1.26 (H) 0.57 - 1.00 mg/dL Final          Passed - K in normal range and within 180 days    Potassium  Date Value Ref Range Status  02/04/2021 4.9 3.5 - 5.2 mmol/L Final          Passed - Patient is not pregnant      Passed - Last BP in normal range    BP Readings from Last 1 Encounters:  07/14/21 113/74          Passed - Valid encounter within last 6 months    Recent Outpatient Visits           1 week ago Acute left-sided low back pain with left-sided sciatica   [facility] [place], Megan P, DO   2 months ago Type 2 diabetes mellitus with diabetic nephropathy, without long-term current use of insulin (HCC)   [facility] [place], Jolene T, NP   5 months ago Type 2 diabetes mellitus with diabetic nephropathy, without long-term current use of insulin (HCC)   [facility] [place], [place] T, NP   6 months ago Sinus pressure   [facility] [place], [place] T, NP   8 months ago Type 2 diabetes mellitus with diabetic nephropathy, without long-term current use of insulin (HCC)   [facility] [place], [place], NP       Future Appointments             In 2 weeks [place], Dorie Rank, NP Dorie Rank, [facility]   In 3 weeks  [employer], [facility]

## 2021-08-04 DIAGNOSIS — M9905 Segmental and somatic dysfunction of pelvic region: Secondary | ICD-10-CM | POA: Diagnosis not present

## 2021-08-04 DIAGNOSIS — M5416 Radiculopathy, lumbar region: Secondary | ICD-10-CM | POA: Diagnosis not present

## 2021-08-04 DIAGNOSIS — M5432 Sciatica, left side: Secondary | ICD-10-CM | POA: Diagnosis not present

## 2021-08-04 DIAGNOSIS — M9903 Segmental and somatic dysfunction of lumbar region: Secondary | ICD-10-CM | POA: Diagnosis not present

## 2021-08-09 ENCOUNTER — Other Ambulatory Visit: Payer: Self-pay

## 2021-08-09 ENCOUNTER — Ambulatory Visit (INDEPENDENT_AMBULATORY_CARE_PROVIDER_SITE_OTHER): Payer: Medicare Other | Admitting: Nurse Practitioner

## 2021-08-09 ENCOUNTER — Encounter: Payer: Self-pay | Admitting: Nurse Practitioner

## 2021-08-09 VITALS — BP 121/71 | HR 80 | Temp 98.6°F | Ht 64.02 in | Wt 201.0 lb

## 2021-08-09 DIAGNOSIS — Z23 Encounter for immunization: Secondary | ICD-10-CM

## 2021-08-09 DIAGNOSIS — N183 Chronic kidney disease, stage 3 unspecified: Secondary | ICD-10-CM | POA: Diagnosis not present

## 2021-08-09 DIAGNOSIS — M9903 Segmental and somatic dysfunction of lumbar region: Secondary | ICD-10-CM | POA: Diagnosis not present

## 2021-08-09 DIAGNOSIS — E1169 Type 2 diabetes mellitus with other specified complication: Secondary | ICD-10-CM | POA: Diagnosis not present

## 2021-08-09 DIAGNOSIS — E1159 Type 2 diabetes mellitus with other circulatory complications: Secondary | ICD-10-CM | POA: Diagnosis not present

## 2021-08-09 DIAGNOSIS — E1122 Type 2 diabetes mellitus with diabetic chronic kidney disease: Secondary | ICD-10-CM

## 2021-08-09 DIAGNOSIS — M5442 Lumbago with sciatica, left side: Secondary | ICD-10-CM

## 2021-08-09 DIAGNOSIS — E785 Hyperlipidemia, unspecified: Secondary | ICD-10-CM

## 2021-08-09 DIAGNOSIS — M5432 Sciatica, left side: Secondary | ICD-10-CM | POA: Diagnosis not present

## 2021-08-09 DIAGNOSIS — E559 Vitamin D deficiency, unspecified: Secondary | ICD-10-CM | POA: Diagnosis not present

## 2021-08-09 DIAGNOSIS — M85852 Other specified disorders of bone density and structure, left thigh: Secondary | ICD-10-CM

## 2021-08-09 DIAGNOSIS — G8929 Other chronic pain: Secondary | ICD-10-CM

## 2021-08-09 DIAGNOSIS — E1121 Type 2 diabetes mellitus with diabetic nephropathy: Secondary | ICD-10-CM

## 2021-08-09 DIAGNOSIS — M545 Low back pain, unspecified: Secondary | ICD-10-CM | POA: Insufficient documentation

## 2021-08-09 DIAGNOSIS — I152 Hypertension secondary to endocrine disorders: Secondary | ICD-10-CM

## 2021-08-09 DIAGNOSIS — E042 Nontoxic multinodular goiter: Secondary | ICD-10-CM | POA: Diagnosis not present

## 2021-08-09 DIAGNOSIS — M5416 Radiculopathy, lumbar region: Secondary | ICD-10-CM | POA: Diagnosis not present

## 2021-08-09 DIAGNOSIS — M9905 Segmental and somatic dysfunction of pelvic region: Secondary | ICD-10-CM | POA: Diagnosis not present

## 2021-08-09 LAB — BAYER DCA HB A1C WAIVED: HB A1C (BAYER DCA - WAIVED): 6.1 % — ABNORMAL HIGH (ref 4.8–5.6)

## 2021-08-09 NOTE — Assessment & Plan Note (Signed)
Chronic, ongoing.  BP at goal today.  Recommend she check BP at home three mornings a week and document for provider.  Continue current medication regimen and adjust as needed.  CMP, CBC, TSH today.  Educated her on Telmisartan and kidney benefit.  Urine micro alb 10 and A:C <24 February 2021.  Focus on DASH diet at home.  Return in 3 months.

## 2021-08-09 NOTE — Assessment & Plan Note (Signed)
Recent imaging noted no need for further imaging.  Will continue monitoring TSH annually, check today. 

## 2021-08-09 NOTE — Assessment & Plan Note (Signed)
Chronic, ongoing.  A1C today 6.1%, praised for success at maintaining below goal.  Urine ALB 04 February 2021, continue Telmisartan for kidney protection.  Continue Jardiance at this time + Ozempic 0.5 MG weekly.  Possibly may d/c Jardiance in future, although this does offer kidney benefit and would prefer to leave on board. Continue increased diet focus and monitoring BS twice a day.  Recommend focus on healthy weight loss and exercise daily.  Return in 3 months.

## 2021-08-09 NOTE — Patient Instructions (Signed)
Diabetes Mellitus and Nutrition, Adult When you have diabetes, or diabetes mellitus, it is very important to have healthy eating habits because your blood sugar (glucose) levels are greatly affected by what you eat and drink. Eating healthy foods in the right amounts, at about the same times every day, can help you:  Control your blood glucose.  Lower your risk of heart disease.  Improve your blood pressure.  Reach or maintain a healthy weight. What can affect my meal plan? Every person with diabetes is different, and each person has different needs for a meal plan. Your health care provider may recommend that you work with a dietitian to make a meal plan that is best for you. Your meal plan may vary depending on factors such as:  The calories you need.  The medicines you take.  Your weight.  Your blood glucose, blood pressure, and cholesterol levels.  Your activity level.  Other health conditions you have, such as heart or kidney disease. How do carbohydrates affect me? Carbohydrates, also called carbs, affect your blood glucose level more than any other type of food. Eating carbs naturally raises the amount of glucose in your blood. Carb counting is a method for keeping track of how many carbs you eat. Counting carbs is important to keep your blood glucose at a healthy level, especially if you use insulin or take certain oral diabetes medicines. It is important to know how many carbs you can safely have in each meal. This is different for every person. Your dietitian can help you calculate how many carbs you should have at each meal and for each snack. How does alcohol affect me? Alcohol can cause a sudden decrease in blood glucose (hypoglycemia), especially if you use insulin or take certain oral diabetes medicines. Hypoglycemia can be a life-threatening condition. Symptoms of hypoglycemia, such as sleepiness, dizziness, and confusion, are similar to symptoms of having too much  alcohol.  Do not drink alcohol if: ? Your health care provider tells you not to drink. ? You are pregnant, may be pregnant, or are planning to become pregnant.  If you drink alcohol: ? Do not drink on an empty stomach. ? Limit how much you use to:  0-1 drink a day for women.  0-2 drinks a day for men. ? Be aware of how much alcohol is in your drink. In the [place] one drink equals one 12 oz bottle of beer (355 mL), one 5 oz glass of wine (148 mL), or one 1 oz glass of hard liquor (44 mL). ? Keep yourself hydrated with water, diet soda, or unsweetened iced tea.  Keep in mind that regular soda, juice, and other mixers may contain a lot of sugar and must be counted as carbs. What are tips for following this plan? Reading food labels  Start by checking the serving size on the "Nutrition Facts" label of packaged foods and drinks. The amount of calories, carbs, fats, and other nutrients listed on the label is based on one serving of the item. Many items contain more than one serving per package.  Check the total grams (g) of carbs in one serving. You can calculate the number of servings of carbs in one serving by dividing the total carbs by 15. For example, if a food has 30 g of total carbs per serving, it would be equal to 2 servings of carbs.  Check the number of grams (g) of saturated fats and trans fats in one serving. Choose foods that have   a low amount or none of these fats.  Check the number of milligrams (mg) of salt (sodium) in one serving. Most people should limit total sodium intake to less than 2,300 mg per day.  Always check the nutrition information of foods labeled as "low-fat" or "nonfat." These foods may be higher in added sugar or refined carbs and should be avoided.  Talk to your dietitian to identify your daily goals for nutrients listed on the label. Shopping  Avoid buying canned, pre-made, or processed foods. These foods tend to be high in fat, sodium, and added  sugar.  Shop around the outside edge of the grocery store. This is where you will most often find fresh fruits and vegetables, bulk grains, fresh meats, and fresh dairy. Cooking  Use low-heat cooking methods, such as baking, instead of high-heat cooking methods like deep frying.  Cook using healthy oils, such as olive, canola, or sunflower oil.  Avoid cooking with butter, cream, or high-fat meats. Meal planning  Eat meals and snacks regularly, preferably at the same times every day. Avoid going long periods of time without eating.  Eat foods that are high in fiber, such as fresh fruits, vegetables, beans, and whole grains. Talk with your dietitian about how many servings of carbs you can eat at each meal.  Eat 4-6 oz (112-168 g) of lean protein each day, such as lean meat, chicken, fish, eggs, or tofu. One ounce (oz) of lean protein is equal to: ? 1 oz (28 g) of meat, chicken, or fish. ? 1 egg. ?  cup (62 g) of tofu.  Eat some foods each day that contain healthy fats, such as avocado, nuts, seeds, and fish.   What foods should I eat? Fruits Berries. Apples. Oranges. Peaches. Apricots. Plums. Grapes. Mango. Papaya. Pomegranate. Kiwi. Cherries. Vegetables Lettuce. Spinach. Leafy greens, including kale, chard, collard greens, and mustard greens. Beets. Cauliflower. Cabbage. Broccoli. Carrots. Green beans. Tomatoes. Peppers. Onions. Cucumbers. Brussels sprouts. Grains Whole grains, such as whole-wheat or whole-grain bread, crackers, tortillas, cereal, and pasta. Unsweetened oatmeal. Quinoa. Brown or wild rice. Meats and other proteins Seafood. Poultry without skin. Lean cuts of poultry and beef. Tofu. Nuts. Seeds. Dairy Low-fat or fat-free dairy products such as milk, yogurt, and cheese. The items listed above may not be a complete list of foods and beverages you can eat. Contact a dietitian for more information. What foods should I avoid? Fruits Fruits canned with  syrup. Vegetables Canned vegetables. Frozen vegetables with butter or cream sauce. Grains Refined white flour and flour products such as bread, pasta, snack foods, and cereals. Avoid all processed foods. Meats and other proteins Fatty cuts of meat. Poultry with skin. Breaded or fried meats. Processed meat. Avoid saturated fats. Dairy Full-fat yogurt, cheese, or milk. Beverages Sweetened drinks, such as soda or iced tea. The items listed above may not be a complete list of foods and beverages you should avoid. Contact a dietitian for more information. Questions to ask a health care provider  Do I need to meet with a diabetes educator?  Do I need to meet with a dietitian?  What number can I call if I have questions?  When are the best times to check my blood glucose? Where to find more information:  American Diabetes Association: diabetes.org  Academy of Nutrition and Dietetics: www.eatright.org  National Institute of Diabetes and Digestive and Kidney Diseases: www.niddk.nih.gov  Association of Diabetes Care and Education Specialists: www.diabeteseducator.org Summary  It is important to have healthy eating   habits because your blood sugar (glucose) levels are greatly affected by what you eat and drink.  A healthy meal plan will help you control your blood glucose and maintain a healthy lifestyle.  Your health care provider may recommend that you work with a dietitian to make a meal plan that is best for you.  Keep in mind that carbohydrates (carbs) and alcohol have immediate effects on your blood glucose levels. It is important to count carbs and to use alcohol carefully. This information is not intended to replace advice given to you by your health care provider. Make sure you discuss any questions you have with your health care provider. Document Revised: 10/22/2019 Document Reviewed: 10/22/2019 Elsevier Patient Education  2021 [employer].  

## 2021-08-09 NOTE — Assessment & Plan Note (Signed)
Chronic, ongoing.  Continue current medication regimen and adjust as needed. Lipid panel today. 

## 2021-08-09 NOTE — Progress Notes (Signed)
BP 121/71   Pulse 80   Temp 98.6 F (37 C) (Oral)   Ht 5' 4.02" (1.626 m)   Wt 201 lb (91.2 kg)   LMP  (LMP Unknown)   SpO2 100%   BMI 34.48 kg/m    Subjective:    Patient ID: Kim Thomas, female    DOB: 08/21/1956, 65 y.o.   MRN: [id number]  HPI: Kim Thomas is a 65 y.o. female  Chief Complaint  Patient presents with   Diabetes   Hypertension   Chronic Kidney Disease   Hyperlipidemia   Hypothyroidism   Vit D    BACK PAIN Was seen 07/14/21 -- was given Baclofen and Naproxen.  Reports starting to feel better and is attending chiropractor.  Reports no further pain.   Alleviating factors: muscle relaxer, chiropractor, Baclofen Status: better Treatments attempted:  Baclofen, chiropractor, and APAP  Relief with NSAIDs?: No NSAIDs Taken Nighttime pain:  no Paresthesias / decreased sensation:  no Bowel / bladder incontinence:  no Fevers:  no Dysuria / urinary frequency:  no   DIABETES Continues on Jardiance 25 MG daily and Ozempic 0.5 MG weekly.  June A1c 6.3%.  Her goal is to come completely off medication at some point.  Does endorse enjoying crackers and chips.  Started Ozempic in June 2022 and stopped Glipizide.  Has lost 9 pounds since last visit.  Does take Gabapentin in morning and at night for neuropathy pain and lower back discomfort on occasion. Hypoglycemic episodes:no Polydipsia/polyuria: no Visual disturbance: no Chest pain: no Paresthesias: no Glucose Monitoring: yes             Accucheck frequency: occasionally             Fasting glucose: 99 to 110             Post prandial:             Evening:              Before meals:  Taking Insulin?: no             Long acting insulin:             Short acting insulin: Blood Pressure Monitoring: not checking Retinal Examination: Up To Date Foot Exam: Up to Date Pneumovax: refused Influenza: refused Aspirin: yes   HYPERTENSION / HYPERLIPIDEMIA Continues on Atorvastatin 20 MG daily and Telmisartan 40 MG.    Has history of thyroid nodules noted on imaging, last imaging 02/01/21 -- at that time nodules decreased and no further imaging recommended. Satisfied with current treatment? yes Duration of hypertension: chronic BP monitoring frequency: not checking BP range:  BP medication side effects: no Duration of hyperlipidemia: chronic Cholesterol medication side effects: no Cholesterol supplements: none Medication compliance: good compliance Aspirin: yes Recent stressors: no Recurrent headaches: no Visual changes: no Palpitations: no Dyspnea: no Chest pain: no Lower extremity edema: no Dizzy/lightheaded: no  CHRONIC KIDNEY DISEASE Last labs -- CRT 1.26 and GFR 47.  Saw nephrology, Dr. Thedore Mins, on 02/22/21 when sodium bicarb was started -- returns to see him this month. CKD status: stable Medications renally dose: yes Previous renal evaluation: yes Pneumovax:  Not up to Date Influenza Vaccine:  Not up to Date   OSTEOPENIA Noted on DEXA 01/28/2020 with T-score at left femur -2.2.  Satisfied with current treatment?: yes Past osteoporosis medications/treatments: none Adequate calcium & vitamin D: yes Weight bearing exercises: yes  Relevant past medical, surgical, family and social history reviewed and updated  as indicated. Interim medical history since our last visit reviewed. Allergies and medications reviewed and updated.  Review of Systems  Constitutional:  Negative for activity change, appetite change, diaphoresis, fatigue and fever.  Respiratory:  Negative for cough, chest tightness and shortness of breath.   Cardiovascular:  Negative for chest pain, palpitations and leg swelling.  Gastrointestinal: Negative.   Endocrine: Negative for cold intolerance, heat intolerance, polydipsia, polyphagia and polyuria.  Neurological: Negative.   Psychiatric/Behavioral: Negative.     Per HPI unless specifically indicated above     Objective:    BP 121/71   Pulse 80   Temp 98.6 F (37  C) (Oral)   Ht 5' 4.02" (1.626 m)   Wt 201 lb (91.2 kg)   LMP  (LMP Unknown)   SpO2 100%   BMI 34.48 kg/m   Wt Readings from Last 3 Encounters:  08/09/21 201 lb (91.2 kg)  07/14/21 203 lb (92.1 kg)  05/06/21 210 lb 3.2 oz (95.3 kg)    Physical Exam Vitals and nursing note reviewed.  Constitutional:      General: She is awake. She is not in acute distress.    Appearance: She is well-developed. She is obese. She is not ill-appearing.  HENT:     Head: Normocephalic.     Right Ear: Hearing normal. No drainage.     Left Ear: Hearing normal. No drainage.  Eyes:     General: Lids are normal.        Right eye: No discharge.        Left eye: No discharge.     Conjunctiva/sclera: Conjunctivae normal.     Pupils: Pupils are equal, round, and reactive to light.  Neck:     Vascular: No carotid bruit.  Cardiovascular:     Rate and Rhythm: Normal rate and regular rhythm.     Heart sounds: Normal heart sounds. No murmur heard.   No gallop.  Pulmonary:     Effort: Pulmonary effort is normal. No accessory muscle usage or respiratory distress.     Breath sounds: Normal breath sounds.  Abdominal:     General: Bowel sounds are normal.     Palpations: Abdomen is soft.  Musculoskeletal:     Cervical back: Normal range of motion and neck supple.     Right lower leg: No edema.     Left lower leg: No edema.  Skin:    General: Skin is warm and dry.  Neurological:     Mental Status: She is alert and oriented to person, place, and time.  Psychiatric:        Attention and Perception: Attention normal.        Mood and Affect: Mood normal.        Speech: Speech normal.        Behavior: Behavior normal. Behavior is cooperative.        Thought Content: Thought content normal.   Results for orders placed or performed in visit on 05/06/21  Bayer DCA Hb A1c Waived  Result Value Ref Range   HB A1C (BAYER DCA - WAIVED) 6.3 <7.0 %      Assessment & Plan:   Problem List Items Addressed This  Visit       Cardiovascular and Mediastinum   Hypertension associated with diabetes (HCC)    Chronic, ongoing.  BP at goal today.  Recommend she check BP at home three mornings a week and document for provider.  Continue current medication regimen and adjust as  needed.  CMP, CBC, TSH today.  Educated her on Telmisartan and kidney benefit.  Urine micro alb 10 and A:C <24 February 2021.  Focus on DASH diet at home.  Return in 3 months.      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  A1C today 6.1%, praised for success at maintaining below goal.  Urine ALB 04 February 2021, continue Telmisartan for kidney protection.  Continue Jardiance at this time + Ozempic 0.5 MG weekly.  Possibly may d/c Jardiance in future, although this does offer kidney benefit and would prefer to leave on board. Continue increased diet focus and monitoring BS twice a day.  Recommend focus on healthy weight loss and exercise daily.  Return in 3 months.      Relevant Orders   Bayer DCA Hb A1c Waived   CKD stage 3 due to type 2 diabetes mellitus (HCC)    Chronic, ongoing.  Continue Telmisartan for kidney protection.  Check CMP and CBC today.  Continue collaboration with nephrology, appreciate their input.      Relevant Orders   Bayer DCA Hb A1c Waived   Hyperlipidemia associated with type 2 diabetes mellitus (HCC)    Chronic, ongoing.  Continue current medication regimen and adjust as needed.  Lipid panel today.      Relevant Orders   Bayer DCA Hb A1c Waived   Lipid Panel w/o Chol/HDL Ratio   Multinodular thyroid    Recent imaging noted no need for further imaging.  Will continue monitoring TSH annually, check today.        Musculoskeletal and Integument   Osteopenia of neck of left femur    Noted on recent DEXA -- continue supplements daily  and check Vit D level.  Plan for repeat DEXA in 2026.  Discussed fall prevention.  Vit D level today.        Other   Vitamin D deficiency    Ongoing with underlying osteopenia, continue daily supplement and check Vit D level today.      Relevant Orders   VITAMIN D 25 Hydroxy (Vit-D Deficiency, Fractures)   Morbid obesity (HCC)    BMI 34.48 with T2DM, HTN/HLD.  Recommended eating smaller high protein, low fat meals more frequently and exercising 30 mins a day 5 times a week with a goal of 10-15lb weight loss in the next 3 months. Patient voiced their understanding and motivation to adhere to these recommendations.       Chronic left-sided low back pain    Improved acute pain at this time. Continue Gabapentin daily, refills as needed.  Continue to collaborate with chiropractor.      Other Visit Diagnoses     Pneumococcal vaccination given       PCV13 vaccine today.  Educated patient.   Relevant Orders   Pneumococcal conjugate vaccine 13-valent        Follow up plan: Return in about 3 months (around 11/08/2021) for Annual physical == with diabetes check.

## 2021-08-09 NOTE — Assessment & Plan Note (Signed)
Improved acute pain at this time. Continue Gabapentin daily, refills as needed.  Continue to collaborate with chiropractor.

## 2021-08-09 NOTE — Progress Notes (Deleted)
3

## 2021-08-09 NOTE — Assessment & Plan Note (Signed)
Noted on recent DEXA -- continue supplements daily and check Vit D level.  Plan for repeat DEXA in 2026.  Discussed fall prevention.  Vit D level today.

## 2021-08-09 NOTE — Assessment & Plan Note (Signed)
Chronic, ongoing.  Continue Telmisartan for kidney protection.  Check CMP and CBC today.  Continue collaboration with nephrology, appreciate their input.

## 2021-08-09 NOTE — Assessment & Plan Note (Signed)
Ongoing with underlying osteopenia, continue daily supplement and check Vit D level today. 

## 2021-08-09 NOTE — Assessment & Plan Note (Signed)
BMI 34.48 with T2DM, HTN/HLD.  Recommended eating smaller high protein, low fat meals more frequently and exercising 30 mins a day 5 times a week with a goal of 10-15lb weight loss in the next 3 months. Patient voiced their understanding and motivation to adhere to these recommendations.

## 2021-08-10 LAB — COMPREHENSIVE METABOLIC PANEL
ALT: 12 IU/L (ref 0–32)
AST: 16 IU/L (ref 0–40)
Albumin/Globulin Ratio: 1.4 (ref 1.2–2.2)
Albumin: 4.3 g/dL (ref 3.8–4.8)
Alkaline Phosphatase: 77 IU/L (ref 44–121)
BUN/Creatinine Ratio: 11 — ABNORMAL LOW (ref 12–28)
BUN: 13 mg/dL (ref 8–27)
Bilirubin Total: 0.3 mg/dL (ref 0.0–1.2)
CO2: 21 mmol/L (ref 20–29)
Calcium: 9.4 mg/dL (ref 8.7–10.3)
Chloride: 106 mmol/L (ref 96–106)
Creatinine, Ser: 1.16 mg/dL — ABNORMAL HIGH (ref 0.57–1.00)
Globulin, Total: 3 g/dL (ref 1.5–4.5)
Glucose: 118 mg/dL — ABNORMAL HIGH (ref 65–99)
Potassium: 4.4 mmol/L (ref 3.5–5.2)
Sodium: 142 mmol/L (ref 134–144)
Total Protein: 7.3 g/dL (ref 6.0–8.5)
eGFR: 52 mL/min/{1.73_m2} — ABNORMAL LOW (ref >59)

## 2021-08-10 LAB — CBC WITH DIFFERENTIAL/PLATELET
Basophils Absolute: 0 10*3/uL (ref 0.0–0.2)
Basos: 1 %
EOS (ABSOLUTE): 0.1 10*3/uL (ref 0.0–0.4)
Eos: 2 %
Hematocrit: 41.3 % (ref 34.0–46.6)
Hemoglobin: 13.3 g/dL (ref 11.1–15.9)
Immature Grans (Abs): 0 10*3/uL (ref 0.0–0.1)
Immature Granulocytes: 0 %
Lymphocytes Absolute: 1.5 10*3/uL (ref 0.7–3.1)
Lymphs: 30 %
MCH: 29.6 pg (ref 26.6–33.0)
MCHC: 32.2 g/dL (ref 31.5–35.7)
MCV: 92 fL (ref 79–97)
Monocytes Absolute: 0.7 10*3/uL (ref 0.1–0.9)
Monocytes: 13 %
Neutrophils Absolute: 2.7 10*3/uL (ref 1.4–7.0)
Neutrophils: 54 %
Platelets: 227 10*3/uL (ref 150–450)
RBC: 4.5 x10E6/uL (ref 3.77–5.28)
RDW: 14.5 % (ref 11.7–15.4)
WBC: 5 10*3/uL (ref 3.4–10.8)

## 2021-08-10 LAB — LIPID PANEL W/O CHOL/HDL RATIO
Cholesterol, Total: 115 mg/dL (ref 100–199)
HDL: 50 mg/dL (ref >39)
LDL Chol Calc (NIH): 45 mg/dL (ref 0–99)
Triglycerides: 110 mg/dL (ref 0–149)
VLDL Cholesterol Cal: 20 mg/dL (ref 5–40)

## 2021-08-10 LAB — VITAMIN D 25 HYDROXY (VIT D DEFICIENCY, FRACTURES): Vit D, 25-Hydroxy: 85 ng/mL (ref 30.0–100.0)

## 2021-08-10 LAB — TSH: TSH: 0.839 u[IU]/mL (ref 0.450–4.500)

## 2021-08-10 NOTE — Progress Notes (Signed)
Contacted via MyChart   Good evening Kim Thomas, your labs have returned and overall everything remains stable.  Kidney function continues to show mild kidney disease, but no worsening.  Overall doing well.  Any questions? Keep being awesome!!  Thank you for allowing me to participate in your care.  I appreciate you. Kindest regards, Jaleeah Slight

## 2021-08-11 DIAGNOSIS — M5416 Radiculopathy, lumbar region: Secondary | ICD-10-CM | POA: Diagnosis not present

## 2021-08-11 DIAGNOSIS — M5432 Sciatica, left side: Secondary | ICD-10-CM | POA: Diagnosis not present

## 2021-08-11 DIAGNOSIS — M9903 Segmental and somatic dysfunction of lumbar region: Secondary | ICD-10-CM | POA: Diagnosis not present

## 2021-08-11 DIAGNOSIS — M9905 Segmental and somatic dysfunction of pelvic region: Secondary | ICD-10-CM | POA: Diagnosis not present

## 2021-08-16 ENCOUNTER — Ambulatory Visit (INDEPENDENT_AMBULATORY_CARE_PROVIDER_SITE_OTHER): Payer: Medicare Other

## 2021-08-16 VITALS — Ht 64.0 in | Wt 201.0 lb

## 2021-08-16 DIAGNOSIS — Z0000 Encounter for general adult medical examination without abnormal findings: Secondary | ICD-10-CM | POA: Diagnosis not present

## 2021-08-16 DIAGNOSIS — M9903 Segmental and somatic dysfunction of lumbar region: Secondary | ICD-10-CM | POA: Diagnosis not present

## 2021-08-16 DIAGNOSIS — M9905 Segmental and somatic dysfunction of pelvic region: Secondary | ICD-10-CM | POA: Diagnosis not present

## 2021-08-16 DIAGNOSIS — M5416 Radiculopathy, lumbar region: Secondary | ICD-10-CM | POA: Diagnosis not present

## 2021-08-16 DIAGNOSIS — M5432 Sciatica, left side: Secondary | ICD-10-CM | POA: Diagnosis not present

## 2021-08-16 NOTE — Patient Instructions (Signed)
Kim Thomas , Thank you for taking time to come for your Medicare Wellness Visit. I appreciate your ongoing commitment to your health goals. Please review the following plan we discussed and let me know if I can assist you in the future.   Screening recommendations/referrals: Colonoscopy: completed 11/28/2013 Mammogram: completed 03/17/2021 Bone Density: completed 01/28/2020 Recommended yearly ophthalmology/optometry visit for glaucoma screening and checkup Recommended yearly dental visit for hygiene and checkup  Vaccinations: Influenza vaccine: decline Pneumococcal vaccine: completed 08/09/2021 Tdap vaccine: completed 04/22/2015, due 04/21/2025 Shingles vaccine: complete   Covid-19: 04/18/2020, 03/28/2020  Advanced directives: Please bring a copy of your POA (Power of Attorney) and/or Living Will to your next appointment.   Conditions/risks identified: none  Next appointment: Follow up in one year for your annual wellness visit    Preventive Care 65 Years and Older, Female Preventive care refers to lifestyle choices and visits with your health care provider that can promote health and wellness. What does preventive care include? A yearly physical exam. This is also called an annual well check. Dental exams once or twice a year. Routine eye exams. Ask your health care provider how often you should have your eyes checked. Personal lifestyle choices, including: Daily care of your teeth and gums. Regular physical activity. Eating a healthy diet. Avoiding tobacco and drug use. Limiting alcohol use. Practicing safe sex. Taking low-dose aspirin every day. Taking vitamin and mineral supplements as recommended by your health care provider. What happens during an annual well check? The services and screenings done by your health care provider during your annual well check will depend on your age, overall health, lifestyle risk factors, and family history of disease. Counseling  Your health care  provider may ask you questions about your: Alcohol use. Tobacco use. Drug use. Emotional well-being. Home and relationship well-being. Sexual activity. Eating habits. History of falls. Memory and ability to understand (cognition). Work and work Astronomer. Reproductive health. Screening  You may have the following tests or measurements: Height, weight, and BMI. Blood pressure. Lipid and cholesterol levels. These may be checked every 5 years, or more frequently if you are over 57 years old. Skin check. Lung cancer screening. You may have this screening every year starting at age 46 if you have a 30-pack-year history of smoking and currently smoke or have quit within the past 15 years. Fecal occult blood test (FOBT) of the stool. You may have this test every year starting at age 16. Flexible sigmoidoscopy or colonoscopy. You may have a sigmoidoscopy every 5 years or a colonoscopy every 10 years starting at age 73. Hepatitis C blood test. Hepatitis B blood test. Sexually transmitted disease (STD) testing. Diabetes screening. This is done by checking your blood sugar (glucose) after you have not eaten for a while (fasting). You may have this done every 1-3 years. Bone density scan. This is done to screen for osteoporosis. You may have this done starting at age 33. Mammogram. This may be done every 1-2 years. Talk to your health care provider about how often you should have regular mammograms. Talk with your health care provider about your test results, treatment options, and if necessary, the need for more tests. Vaccines  Your health care provider may recommend certain vaccines, such as: Influenza vaccine. This is recommended every year. Tetanus, diphtheria, and acellular pertussis (Tdap, Td) vaccine. You may need a Td booster every 10 years. Zoster vaccine. You may need this after age 50. Pneumococcal 13-valent conjugate (PCV13) vaccine. One dose is recommended  after age  61. Pneumococcal polysaccharide (PPSV23) vaccine. One dose is recommended after age 62. Talk to your health care provider about which screenings and vaccines you need and how often you need them. This information is not intended to replace advice given to you by your health care provider. Make sure you discuss any questions you have with your health care provider. Document Released: 12/11/2015 Document Revised: 08/03/2016 Document Reviewed: 09/15/2015 Elsevier Interactive Patient Education  2017 [place] Prevention in the Home Falls can cause injuries. They can happen to people of all ages. There are many things you can do to make your home safe and to help prevent falls. What can I do on the outside of my home? Regularly fix the edges of walkways and driveways and fix any cracks. Remove anything that might make you trip as you walk through a door, such as a raised step or threshold. Trim any bushes or trees on the path to your home. Use bright outdoor lighting. Clear any walking paths of anything that might make someone trip, such as rocks or tools. Regularly check to see if handrails are loose or broken. Make sure that both sides of any steps have handrails. Any raised decks and porches should have guardrails on the edges. Have any leaves, snow, or ice cleared regularly. Use sand or salt on walking paths during winter. Clean up any spills in your garage right away. This includes oil or grease spills. What can I do in the bathroom? Use night lights. Install grab bars by the toilet and in the tub and shower. Do not use towel bars as grab bars. Use non-skid mats or decals in the tub or shower. If you need to sit down in the shower, use a plastic, non-slip stool. Keep the floor dry. Clean up any water that spills on the floor as soon as it happens. Remove soap buildup in the tub or shower regularly. Attach bath mats securely with double-sided non-slip rug tape. Do not have throw  rugs and other things on the floor that can make you trip. What can I do in the bedroom? Use night lights. Make sure that you have a light by your bed that is easy to reach. Do not use any sheets or blankets that are too big for your bed. They should not hang down onto the floor. Have a firm chair that has side arms. You can use this for support while you get dressed. Do not have throw rugs and other things on the floor that can make you trip. What can I do in the kitchen? Clean up any spills right away. Avoid walking on wet floors. Keep items that you use a lot in easy-to-reach places. If you need to reach something above you, use a strong step stool that has a grab bar. Keep electrical cords out of the way. Do not use floor polish or wax that makes floors slippery. If you must use wax, use non-skid floor wax. Do not have throw rugs and other things on the floor that can make you trip. What can I do with my stairs? Do not leave any items on the stairs. Make sure that there are handrails on both sides of the stairs and use them. Fix handrails that are broken or loose. Make sure that handrails are as long as the stairways. Check any carpeting to make sure that it is firmly attached to the stairs. Fix any carpet that is loose or worn. Avoid having throw  rugs at the top or bottom of the stairs. If you do have throw rugs, attach them to the floor with carpet tape. Make sure that you have a light switch at the top of the stairs and the bottom of the stairs. If you do not have them, ask someone to add them for you. What else can I do to help prevent falls? Wear shoes that: Do not have high heels. Have rubber bottoms. Are comfortable and fit you well. Are closed at the toe. Do not wear sandals. If you use a stepladder: Make sure that it is fully opened. Do not climb a closed stepladder. Make sure that both sides of the stepladder are locked into place. Ask someone to hold it for you, if  possible. Clearly mark and make sure that you can see: Any grab bars or handrails. First and last steps. Where the edge of each step is. Use tools that help you move around (mobility aids) if they are needed. These include: Canes. Walkers. Scooters. Crutches. Turn on the lights when you go into a dark area. Replace any light bulbs as soon as they burn out. Set up your furniture so you have a clear path. Avoid moving your furniture around. If any of your floors are uneven, fix them. If there are any pets around you, be aware of where they are. Review your medicines with your doctor. Some medicines can make you feel dizzy. This can increase your chance of falling. Ask your doctor what other things that you can do to help prevent falls. This information is not intended to replace advice given to you by your health care provider. Make sure you discuss any questions you have with your health care provider. Document Released: 09/10/2009 Document Revised: 04/21/2016 Document Reviewed: 12/19/2014 Elsevier Interactive Patient Education  2017 [employer].

## 2021-08-16 NOTE — Progress Notes (Signed)
I connected with Kim Thomas today by telephone and verified that I am speaking with the correct person using two identifiers. Location patient: home Location provider: work Persons participating in the virtual visit: Cintya Daughety, Glenna Durand LPN.   I discussed the limitations, risks, security and privacy concerns of performing an evaluation and management service by telephone and the availability of in person appointments. I also discussed with the patient that there may be a patient responsible charge related to this service. The patient expressed understanding and verbally consented to this telephonic visit.    Interactive audio and video telecommunications were attempted between this provider and patient, however failed, due to patient having technical difficulties OR patient did not have access to video capability.  We continued and completed visit with audio only.     Vital signs may be patient reported or missing.  Subjective:   Kim Thomas is a 65 y.o. female who presents for Medicare Annual (Subsequent) preventive examination.  Review of Systems     Cardiac Risk Factors include: advanced age (>70mn, >>47women);diabetes mellitus;dyslipidemia;hypertension;obesity (BMI >30kg/m2)     Objective:    Today's Vitals   08/16/21 1026  Weight: 201 lb (91.2 kg)  Height: _0  (1.626 m)   Body mass index is 34.5 kg/m.  Advanced Directives 08/16/2021 08/14/2020 08/14/2019 01/03/2019 12/13/2018 09/13/2017 05/24/2017  Does Patient Have a Medical Advance Directive? _1  Yes No  Type of AParamedicof AMontpelierLiving will HMercerLiving will Living will;Healthcare Power of ADaggettLiving will HCoshoctonLiving will HF[place]inghamLiving will -  Does patient want to make changes to medical advance directive? - - - - No - Patient declined - -  Copy of [place]in Chart? No - copy requested No - copy requested No - copy requested - No - copy requested - -  Would patient like information on creating a medical advance directive? - - - - - - -    Current Medications (verified) Outpatient Encounter Medications as of 08/16/2021  Medication Sig   acetaminophen (TYLENOL) 500 MG tablet Take 500 mg by mouth daily as needed.   ascorbic acid (VITAMIN C) 1000 MG tablet Take by mouth.   Aspirin (VAZALORE) 81 MG CAPS Take by mouth.   atorvastatin (LIPITOR) 20 MG tablet TAKE 1 TABLET BY MOUTH  DAILY   baclofen (LIORESAL) 10 MG tablet Take 1 tablet (10 mg total) by mouth at bedtime as needed for muscle spasms.   Blood Glucose Monitoring Suppl (ONE TOUCH ULTRA 2) w/Device KIT Use to check blood sugar 3-4 times daily and document for visits. ICD E11.21   CALCIUM CITRATE PO Take 1,000 mg by mouth daily.   Cholecalciferol (VITAMIN D3) 2000 units TABS Take 2,000 Units by mouth daily.   Co-Enzyme Q-10 100 MG CAPS Take 50 mg by mouth daily.   diclofenac sodium (VOLTAREN) 1 % GEL Apply topically.   EQ ALLERGY RELIEF, CETIRIZINE, 10 MG tablet Take 1 tablet by mouth once daily   fluticasone (FLONASE) 50 MCG/ACT nasal spray USE 1 TO 2 SPRAY(S) IN EACH NOSTRIL TWICE DAILY   gabapentin (NEURONTIN) 400 MG capsule TAKE 1 CAPSULE BY MOUTH 3  TIMES DAILY   JARDIANCE 25 MG TABS tablet TAKE 1 TABLET BY MOUTH  DAILY   Lancets (ONETOUCH DELICA PLUS [id number] MISC 1 each by Does not apply route 2 (two) times daily.   Magnesium 400 MG TABS  Take by mouth.   Multiple Vitamin (MULTIVITAMIN) tablet Take 1 tablet by mouth daily.   naproxen (NAPROSYN) 500 MG tablet Take 1 tablet (500 mg total) by mouth 2 (two) times daily with a meal.   Omega-3 1000 MG CAPS Take 1 capsule by mouth daily.   omeprazole (PRILOSEC) 20 MG capsule TAKE 1 CAPSULE BY MOUTH  DAILY   ONETOUCH ULTRA test strip USE TO CHECK BLOOD SUGAR  DAILY   Potassium 99 MG TABS Take by mouth.   Semaglutide,0.25 or  0.5MG/DOS, (OZEMPIC, 0.25 OR 0.5 MG/DOSE,) 2 MG/1.5ML SOPN Inject 0.25 mg into the skin once a week. Start with 0.25 MG once a week injected into skin x 4 weeks, then increase to 0.5 MG weekly into skin.   sodium bicarbonate 650 MG tablet Take by mouth.   telmisartan (MICARDIS) 40 MG tablet TAKE 1 TABLET BY MOUTH  DAILY   triamcinolone cream (KENALOG) 0.1 % Apply 1 application topically 2 (two) times daily.   vitamin E 100 UNIT capsule Take 100 Units by mouth daily.   No facility-administered encounter medications on file as of 08/16/2021.    Allergies (verified) Morphine and related, Erythromycin, and Hydrocodone-acetaminophen   History: Past Medical History:  Diagnosis Date   Anxiety    Chronic kidney disease    DDD (degenerative disc disease), cervical    Diabetes mellitus without complication ([place])    Gout    Hypertension    Sleep apnea    Past Surgical History:  Procedure Laterality Date   BREAST BIOPSY Left    benign   BREAST CYST EXCISION Left    c section     x2   CHOLECYSTECTOMY     EYE SURGERY Bilateral 2019   catracts Dr.Hecter in [place]    FOOT SURGERY Bilateral    KNEE ARTHROSCOPY Left 07/18/2016   Procedure: ARTHROSCOPY KNEE;  Surgeon: Dereck Leep, MD;  Location: [facility] ORS;  Service: Orthopedics;  Laterality: Left;   KNEE ARTHROSCOPY WITH LATERAL MENISECTOMY Left 07/18/2016   Procedure: KNEE ARTHROSCOPY WITH PARTIAL  LATERAL MENISECTOMY, CHONDROPLASTY LATERAL FEMORAL CONDYLE;  Surgeon: Dereck Leep, MD;  Location: [facility] ORS;  Service: Orthopedics;  Laterality: Left;   SPINE SURGERY  11/09/11   C5   TUBAL LIGATION     Family History  Problem Relation Age of Onset   Cancer Mother        breast   Hypertension Mother    Hyperlipidemia Mother    Diabetes Mother    Glaucoma Mother    Cataracts Mother    Breast cancer Mother 68   Cancer Father    Cancer Maternal Grandfather    Breast cancer Maternal Aunt    Breast cancer Cousin    Alcohol abuse Neg  Hx    Social History   Socioeconomic History   Marital status: Single    Spouse name: Not on file   Number of children: Not on file   Years of education: Not on file   Highest education level: Bachelor's degree (e.g., BA, AB, BS)  Occupational History   Occupation: retired  Tobacco Use   Smoking status: Former    Packs/day: 0.25    Types: Cigarettes    Quit date: 05/21/2011    Years since quitting: 10.2   Smokeless tobacco: Never  Vaping Use   Vaping Use: Never used  Substance and Sexual Activity   Alcohol use: No    Alcohol/week: 0.0 standard drinks   Drug use: No   Sexual  activity: Not Currently  Other Topics Concern   Not on file  Social History Narrative   Not on file   Social Determinants of Health   Financial Resource Strain: Low Risk    Difficulty of Paying Living Expenses: Not hard at all  Food Insecurity: No Food Insecurity   Worried About Charity fundraiser in the Last Year: Never true   [place] in the Last Year: Never true  Transportation Needs: No Transportation Needs   Lack of Transportation (Medical): No   Lack of Transportation (Non-Medical): No  Physical Activity: Insufficiently Active   Days of Exercise per Week: 3 days   Minutes of Exercise per Session: 40 min  Stress: No Stress Concern Present   Feeling of Stress : Not at all  Social Connections: Not on file    Tobacco Counseling Counseling given: Not Answered   Clinical Intake:  Pre-visit preparation completed: Yes  Pain : No/denies pain     Nutritional Status: BMI > 30  Obese Nutritional Risks: None Diabetes: Yes  How often do you need to have someone help you when you read instructions, pamphlets, or other written materials from your doctor or pharmacy?: 1 - Never What is the last grade level you completed in school?: BS degree  Diabetic? Yes Nutrition Risk Assessment:  Has the patient had any N/V/D within the last 2 months?  No  Does the patient have any  non-healing wounds?  No  Has the patient had any unintentional weight loss or weight gain?  No   Diabetes:  Is the patient diabetic?  Yes  If diabetic, was a CBG obtained today?  No  Did the patient bring in their glucometer from home?  No  How often do you monitor your CBG's? Once weekly.   Financial Strains and Diabetes Management:  Are you having any financial strains with the device, your supplies or your medication? No .  Does the patient want to be seen by Chronic Care Management for management of their diabetes?  No  Would the patient like to be referred to a Nutritionist or for Diabetic Management?  No   Diabetic Exams:  Diabetic Eye Exam: Completed 03/12/2021 Diabetic Foot Exam: Completed 07/2021   Interpreter Needed?: No  Information entered by :: NAllen LPN   Activities of Daily Living In your present state of health, do you have any difficulty performing the following activities: 08/16/2021  Hearing? N  Vision? N  Difficulty concentrating or making decisions? N  Walking or climbing stairs? Y  Dressing or bathing? N  Doing errands, shopping? N  Preparing Food and eating ? N  Using the Toilet? N  In the past six months, have you accidently leaked urine? N  Do you have problems with loss of bowel control? N  Managing your Medications? N  Managing your Finances? N  Housekeeping or managing your Housekeeping? N  Some recent data might be hidden    Patient Care Team: Venita Lick, NP as PCP - General (Nurse Practitioner) Anabel Bene, MD as Referring Physician (Neurology) Watt Climes, PA as Physician Assistant (Orthopedic Surgery) Marry Guan Laurice Record, MD (Orthopedic Surgery) Vanita Ingles, RN as Registered Nurse (General Practice)  Indicate any recent Medical Services you may have received from other than [facility] providers in the past year (date may be approximate).     Assessment:   This is a routine wellness examination for [facility].  Hearing/Vision  screen Vision Screening - Comments:: Regular  eye exams,  Dietary issues and exercise activities discussed: Current Exercise Habits: Home exercise routine, Type of exercise: walking;strength training/weights, Time (Minutes): 45, Frequency (Times/Week): 3, Weekly Exercise (Minutes/Week): 135   Goals Addressed             This Visit's Progress    Patient Stated       08/16/2021, wants to lose about 15 pounds       Depression Screen PHQ 2/9 Scores 08/16/2021 08/09/2021 05/06/2021 08/14/2020 03/04/2020 01/17/2020 08/14/2019  PHQ - 2 Score 0 0 0 0 0 0 0  PHQ- 9 Score - 0 0 - - - -    Fall Risk Fall Risk  08/16/2021 08/09/2021 05/06/2021 08/14/2020 03/04/2020  Falls in the past year? 0 0 0 0 0  Comment - - - - -  Number falls in past yr: - 0 0 - 0  Comment - - - - -  Injury with Fall? - 0 0 - 0  Risk Factor Category  - - - - -  Risk for fall due to : Medication side effect No Fall Risks No Fall Risks Medication side effect -  Follow up Falls evaluation completed;Education provided;Falls prevention discussed Falls evaluation completed Falls evaluation completed Falls evaluation completed;Education provided;Falls prevention discussed Falls evaluation completed    FALL RISK PREVENTION PERTAINING TO THE HOME:  Any stairs in or around the home? Yes  If so, are there any without handrails? No  Home free of loose throw rugs in walkways, pet beds, electrical cords, etc? Yes  Adequate lighting in your home to reduce risk of falls? Yes   ASSISTIVE DEVICES UTILIZED TO PREVENT FALLS:  Life alert? No  Use of a cane, walker or w/c? Yes  Grab bars in the bathroom? Yes  Shower chair or bench in shower? Yes  Elevated toilet seat or a handicapped toilet? Yes   TIMED UP AND GO:  Was the test performed? No .      Cognitive Function:     6CIT Screen 08/16/2021 08/14/2020 05/04/2018 06/02/2017  What Year? 0 points 0 points 0 points 0 points  What month? 0 points 0 points 0 points 0 points  What time? 0  points 0 points 0 points 0 points  Count back from 20 0 points 0 points 0 points 0 points  Months in reverse 0 points 0 points 0 points 0 points  Repeat phrase 0 points 0 points 0 points 0 points  Total Score 0 0 0 0    Immunizations Immunization History  Administered Date(s) Administered   PFIZER(Purple Top)SARS-COV-2 Vaccination 03/28/2020, 04/18/2020   Pneumococcal Conjugate-13 08/09/2021   Td 04/22/2015   Zoster Recombinat (Shingrix) 02/23/2017, 06/16/2017    TDAP status: Up to date  Flu Vaccine status: Declined, Education has been provided regarding the importance of this vaccine but patient still declined. Advised may receive this vaccine at local pharmacy or Health Dept. Aware to provide a copy of the vaccination record if obtained from local pharmacy or Health Dept. Verbalized acceptance and understanding.  Pneumococcal vaccine status: Up to date  Covid-19 vaccine status: Completed vaccines  Qualifies for Shingles Vaccine? Yes   Zostavax completed No   Shingrix Completed?: Yes  Screening Tests Health Maintenance  Topic Date Due   COVID-19 Vaccine (3 - Booster for Pfizer series) 08/25/2021 (Originally 09/18/2020)   INFLUENZA VACCINE  02/25/2022 (Originally 06/28/2021)   HEMOGLOBIN A1C  02/06/2022   PAP SMEAR-Modifier  02/20/2022   OPHTHALMOLOGY EXAM  03/12/2022   FOOT EXAM  05/06/2022   MAMMOGRAM  03/18/2023   COLONOSCOPY (Pts 45-58yr Insurance coverage will need to be confirmed)  11/29/2023   TETANUS/TDAP  04/21/2025   DEXA SCAN  Completed   Hepatitis C Screening  Completed   HIV Screening  Completed   Zoster Vaccines- Shingrix  Completed   HPV VACCINES  Aged Out    Health Maintenance  There are no preventive care reminders to display for this patient.  Colorectal cancer screening: Type of screening: Colonoscopy. Completed 11/28/2013. Repeat every 10 years  Mammogram status: Completed 03/17/2021. Repeat every year  Bone Density status: Completed 01/28/2020.    Lung Cancer Screening: (Low Dose CT Chest recommended if Age 648-80years, 30 pack-year currently smoking OR have quit w/in 15years.) does not qualify.   Lung Cancer Screening Referral: no  Additional Screening:  Hepatitis C Screening: does qualify; Completed 06/30/2017  Vision Screening: Recommended annual ophthalmology exams for early detection of glaucoma and other disorders of the eye. Is the patient up to date with their annual eye exam?  Yes  Who is the provider or what is the name of the office in which the patient attends annual eye exams? Dr. HHerbert DeanerIf pt is not established with a provider, would they like to be referred to a provider to establish care? No .   Dental Screening: Recommended annual dental exams for proper oral hygiene  Community Resource Referral / Chronic Care Management: CRR required this visit?  No   CCM required this visit?  No      Plan:     I have personally reviewed and noted the following in the patient's chart:   Medical and social history Use of alcohol, tobacco or illicit drugs  Current medications and supplements including opioid prescriptions.  Functional ability and status Nutritional status Physical activity Advanced directives List of other physicians Hospitalizations, surgeries, and ER visits in previous 12 months Vitals Screenings to include cognitive, depression, and falls Referrals and appointments  In addition, I have reviewed and discussed with patient certain preventive protocols, quality metrics, and best practice recommendations. A written personalized care plan for preventive services as well as general preventive health recommendations were provided to patient.     NKellie Simmering LPN   [id number]  Nurse Notes:

## 2021-08-18 ENCOUNTER — Ambulatory Visit (INDEPENDENT_AMBULATORY_CARE_PROVIDER_SITE_OTHER): Payer: Medicare Other

## 2021-08-18 ENCOUNTER — Telehealth: Payer: Medicare Other | Admitting: General Practice

## 2021-08-18 DIAGNOSIS — E1159 Type 2 diabetes mellitus with other circulatory complications: Secondary | ICD-10-CM

## 2021-08-18 DIAGNOSIS — I152 Hypertension secondary to endocrine disorders: Secondary | ICD-10-CM

## 2021-08-18 DIAGNOSIS — E785 Hyperlipidemia, unspecified: Secondary | ICD-10-CM

## 2021-08-18 DIAGNOSIS — E1121 Type 2 diabetes mellitus with diabetic nephropathy: Secondary | ICD-10-CM

## 2021-08-18 DIAGNOSIS — E1169 Type 2 diabetes mellitus with other specified complication: Secondary | ICD-10-CM

## 2021-08-18 NOTE — Patient Instructions (Signed)
Visit Information  PATIENT GOALS:  Goals Addressed             This Visit's Progress    RNBCM: Prevent Falls and Injury       Follow Up Date 10/27/2021    - add more outdoor lighting - always use handrails on the stairs - always wear low-heeled or flat shoes or slippers with nonskid soles - call the doctor if I am feeling too drowsy - install bathroom grab bars - join an exercise group in my community - keep a flashlight by the bed - keep my cell phone with me always - learn how to get back up if I fall - make an emergency alert plan in case I fall - pick up clutter from the floors - use a nonslip pad with throw rugs, or remove them completely - use a cane or walker - use a nightlight in the bathroom - wear my glasses and/or hearing aid - attend therapy    Why is this important?   Most falls happen when it is hard for you to walk safely. Your balance may be off because of an illness. You may have pain in your knees, hip or other joints.  You may be overly tired or taking medicines that make you sleepy. You may not be able to see or hear clearly.  Falls can lead to broken bones, bruises or other injuries.  There are things you can do to help prevent falling.     Notes: 08-18-2021: The patient denies any new falls. Is doing well. Wants to be more active. The patient is doing well. Will continue to monitor.      RNCM: Grief support       08-18-2021: The patient lost her mother 9 years ago. She is having a hard time with going to visit her mothers grave. She plans on going to [place] next month to visit her mothers grave. Talked to the patient about the [place] program and gave her resources for the [employer].  Patients Goals: -Express her feelings about the loss of her mother -Make a list to complete and work toward completing the list -Set small goals to reach -Look at resources to help with the grief process like [employer]org for local groups -Call the office  for changes in mood, anxiety, depression, or other concerns     RNCM: Monitor and Manage My Blood Sugar-Diabetes Type 2       Timeframe:  Long-Range Goal Priority:  High Start Date:       08-18-2021                      Expected End Date:      08-18-2022                 Follow Up Date 10/27/2021    - check blood sugar at prescribed times - check blood sugar before and after exercise - check blood sugar if I feel it is too high or too low - enter blood sugar readings and medication or insulin into daily log - take the blood sugar log to all doctor visits - take the blood sugar meter to all doctor visits    Why is this important?   Checking your blood sugar at home helps to keep it from getting very high or very low.  Writing the results in a diary or log helps the doctor know how to care for you.  Your blood sugar  log should have the time, date and the results.  Also, write down the amount of insulin or other medicine that you take.  Other information, like what you ate, exercise done and how you were feeling, will also be helpful.     Notes: 08-18-2021: The patient saw pcp recently and her A1C was 6.1. Praised the patient for positive changes. Will continue to monitor.         Patient verbalizes understanding of instructions provided today and agrees to view in MyChart.   Telephone follow up appointment with care management team member scheduled for: 10-27-2021 at 1145 am  Alto Denver RN, MSN, CCM Community Care Coordinator [place]  [facility] Mobile: [phone]

## 2021-08-18 NOTE — Chronic Care Management (AMB) (Signed)
Chronic Care Management   CCM RN Visit Note  08/18/2021 Name: Kim Thomas MRN: [id number] DOB: 1956/05/04  Subjective: Kim Thomas is a 65 y.o. year old female who is a primary care patient of Cannady, [name], NP. The care management team was consulted for assistance with disease management and care coordination needs.    Engaged with patient by telephone for follow up visit in response to provider referral for case management and/or care coordination services.   Consent to Services:  The patient was given information about Chronic Care Management services, agreed to services, and gave verbal consent prior to initiation of services.  Please see initial visit note for detailed documentation.   Patient agreed to services and verbal consent obtained.   Assessment: Review of patient past medical history, allergies, medications, health status, including review of consultants reports, laboratory and other test data, was performed as part of comprehensive evaluation and provision of chronic care management services.   SDOH (Social Determinants of Health) assessments and interventions performed:  SDOH Interventions    Flowsheet Row Most Recent Value  SDOH Interventions   Financial Strain Interventions Intervention Not Indicated  Housing Interventions Intervention Not Indicated  Intimate Partner Violence Interventions Intervention Not Indicated  Physical Activity Interventions Other (Comments)  [is active and likes to get out and walk when the weather is good]  Social Connections Interventions Intervention Not Indicated        CCM Care Plan  Allergies  Allergen Reactions   Morphine And Related Itching    It burned, and made patient itch and go out of her mind.   Erythromycin     "Been so long ago," patient unable to remember reaction   Hydrocodone-Acetaminophen Itching    Outpatient Encounter Medications as of 08/18/2021  Medication Sig Note   acetaminophen (TYLENOL) 500 MG  tablet Take 500 mg by mouth daily as needed. 07/13/2020: Taking one tablet twice a day.   ascorbic acid (VITAMIN C) 1000 MG tablet Take by mouth.    Aspirin (VAZALORE) 81 MG CAPS Take by mouth.    atorvastatin (LIPITOR) 20 MG tablet TAKE 1 TABLET BY MOUTH  DAILY    baclofen (LIORESAL) 10 MG tablet Take 1 tablet (10 mg total) by mouth at bedtime as needed for muscle spasms.    Blood Glucose Monitoring Suppl (ONE TOUCH ULTRA 2) w/Device KIT Use to check blood sugar 3-4 times daily and document for visits. ICD E11.21    CALCIUM CITRATE PO Take 1,000 mg by mouth daily.    Cholecalciferol (VITAMIN D3) 2000 units TABS Take 2,000 Units by mouth daily.    Co-Enzyme Q-10 100 MG CAPS Take 50 mg by mouth daily.    diclofenac sodium (VOLTAREN) 1 % GEL Apply topically.    EQ ALLERGY RELIEF, CETIRIZINE, 10 MG tablet Take 1 tablet by mouth once daily    fluticasone (FLONASE) 50 MCG/ACT nasal spray USE 1 TO 2 SPRAY(S) IN EACH NOSTRIL TWICE DAILY    gabapentin (NEURONTIN) 400 MG capsule TAKE 1 CAPSULE BY MOUTH 3  TIMES DAILY    JARDIANCE 25 MG TABS tablet TAKE 1 TABLET BY MOUTH  DAILY    Lancets (ONETOUCH DELICA PLUS [id number]) MISC 1 each by Does not apply route 2 (two) times daily.    Magnesium 400 MG TABS Take by mouth.    Multiple Vitamin (MULTIVITAMIN) tablet Take 1 tablet by mouth daily.    naproxen (NAPROSYN) 500 MG tablet Take 1 tablet (500 mg total) by mouth 2 (two) times  daily with a meal.    Omega-3 1000 MG CAPS Take 1 capsule by mouth daily.    omeprazole (PRILOSEC) 20 MG capsule TAKE 1 CAPSULE BY MOUTH  DAILY    ONETOUCH ULTRA test strip USE TO CHECK BLOOD SUGAR  DAILY    Potassium 99 MG TABS Take by mouth.    Semaglutide,0.25 or 0.5MG/DOS, (OZEMPIC, 0.25 OR 0.5 MG/DOSE,) 2 MG/1.5ML SOPN Inject 0.25 mg into the skin once a week. Start with 0.25 MG once a week injected into skin x 4 weeks, then increase to 0.5 MG weekly into skin.    sodium bicarbonate 650 MG tablet Take by mouth.    telmisartan  (MICARDIS) 40 MG tablet TAKE 1 TABLET BY MOUTH  DAILY    triamcinolone cream (KENALOG) 0.1 % Apply 1 application topically 2 (two) times daily.    vitamin E 100 UNIT capsule Take 100 Units by mouth daily.    No facility-administered encounter medications on file as of 08/18/2021.    Patient Active Problem List   Diagnosis Date Noted   Chronic left-sided low back pain 08/09/2021   Morbid obesity ([place]) 07/17/2020   Osteopenia of neck of left femur 03/04/2020   Multinodular thyroid 01/02/2020   Vitamin D deficiency 12/03/2019   Hyperlipidemia associated with type 2 diabetes mellitus ([place]) 04/03/2018   Advanced care planning/counseling discussion 02/20/2017   Elevated uric acid in blood 06/21/2016   Allergic rhinitis 06/06/2016   CKD stage 3 due to type 2 diabetes mellitus ([place]) 06/06/2016   Type 2 diabetes mellitus with diabetic nephropathy ([place]) 05/25/2016   Hypertension associated with diabetes ([place]) 05/25/2016   Arthritis of hand, right 05/25/2016   Sleep apnea 05/25/2016   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    Conditions to be addressed/monitored:HTN, HLD, and DMII  Care Plan : RNCM: Diabetes Type 2 (Adult)  Updates made by Vanita Ingles, RN since 08/18/2021 12:00 AM     Problem: RNCM: Glycemic Management (Diabetes, Type 2)   Priority: Medium     Long-Range Goal: RNCM: Glycemic Management Optimized   Start Date: 12/15/2020  Expected End Date: 02/24/2022  This Visit's Progress: On track  Recent Progress: On track  Priority: Medium  Note:   Objective:  Lab Results  Component Value Date   HGBA1C 6.1 (H) 08/09/2021     Lab Results  Component Value Date   CREATININE 1.16 (H) 08/09/2021   BUN 13 08/09/2021   NA 142 08/09/2021   K 4.4 08/09/2021   CL 106 08/09/2021   CO2 21 08/09/2021     No results found for: EGFR Current Barriers:  Knowledge Deficits related to basic Diabetes pathophysiology and self care/management Knowledge Deficits related to medications used for  management of diabetes Limited Social Support Unable to independently manage DM Lacks social connections Does not contact provider office for questions/concerns Case Manager Clinical Goal(s):  Collaboration with Venita Lick, NP regarding development and update of comprehensive plan of care as evidenced by provider attestation and co-signature Inter-disciplinary care team collaboration (see longitudinal plan of care) patient will demonstrate improved adherence to prescribed treatment plan for diabetes self care/management as evidenced by:  daily monitoring and recording of CBG  adherence to ADA/ carb modified diet adherence to prescribed medication regimen Interventions:  Provided education to patient about basic DM disease process. 02-09-2021: The patient saw the pcp recently and got a good report. Hemoglobin A1C elevated a little more but still <7.0. Review of plan of care. 04-13-2021: The patient states she was not  happy.with her hemoglobin A1C results and has been working on eating better. 06-09-2021: The patient states that she is hopeful that the [place] is going to really help her with control of her diabetes and get her off of the trulicity and help her lose weight. She is doing well and excited to see how things go with her as she has been on this medication for about 3 weeks. 08-18-2021: The patient is doing well. She has lost 10 pounds. She is wanting to start walking again and has set a goal for herself. She is sleeping well and monitoring her dietary intake. Will continue to monitor. Praised for positive changes.  Reviewed medications with patient and discussed importance of medication adherence. 04-13-2021: Is compliant with medications and no new concerns. 06-09-2021: The patient states that she had to call the pharmacy to ask about the [place]. She was not doing it correctly at first. Now she is dialing it up right and taking 0.25m. She states she will go to the 0.553m  She has noticed  that it has curbed her appetite and she is not eating snacks and other things like she was. She also started some "beets" supplement and hair, skin, and nails supplement.  08-18-2021: The patient is compliant with medications and is thankful for the Ozempic she is taking. It has curbed her appetite and she has experienced weight loss.  Discussed plans with patient for ongoing care management follow up and provided patient with direct contact information for care management team Provided patient with written educational materials related to hypo and hyperglycemia and importance of correct treatment. 08-18-2021: The patient denies any lows at this time. Denies any acute distress. Working on exercise routine and dietary changes  Reviewed scheduled/upcoming provider appointments including: 11-09-2021 and 0820 am Advised patient, providing education and rationale, to check cbg BID and record, calling pcp for findings outside established parameters.  11-09-2021: The patient checks blood sugars regularly. Knows how to manage highs and lows Review of patient status, including review of consultants reports, relevant laboratory and other test results, and medications completed. Patient Goals/Self-Care Activities  patient will:  - UNABLE to independently DM Self administers oral medications as prescribed Attends all scheduled provider appointments Checks blood sugars as prescribed and utilize hyper and hypoglycemia protocol as needed Adheres to prescribed ADA/carb modified - barriers to adherence to treatment plan identified - blood glucose readings reviewed - individualized medical nutrition therapy provided - mutual A1C goal set or reviewed - resources required to improve adherence to care identified - self-awareness of signs/symptoms of hypo or hyperglycemia encouraged - use of blood glucose monitoring log promoted Follow Up Plan: Telephone follow up appointment with care management team member scheduled  for: 10-27-2021 at 1145 am    Care Plan : RNCM: Hypertension (Adult)  Updates made by TaVanita InglesRN since 08/18/2021 12:00 AM     Problem: RNCM: Hypertension (Hypertension)   Priority: Medium     Long-Range Goal: RNCM: Hypertension Monitored   Start Date: 12/15/2020  Expected End Date: 02/24/2022  This Visit's Progress: On track  Recent Progress: On track  Priority: Medium  Note:   Objective:  Last practice recorded BP readings:  BP Readings from Last 3 Encounters:  08/09/21 121/71  07/14/21 113/74  05/06/21 121/71   Most recent eGFR/CrCl:  Lab Results  Component Value Date   EGFR 52 (L) 08/09/2021    No components found for: CRCL Current Barriers:  Knowledge Deficits related to basic understanding of hypertension pathophysiology  and self care management Knowledge Deficits related to understanding of medications prescribed for management of hypertension Unable to independently manage HTN Lacks social connections Case Manager Clinical Goal(s):  patient will verbalize understanding of plan for hypertension management patient will attend all scheduled medical appointments: 08-09-2021 at 840 am patient will demonstrate improved adherence to prescribed treatment plan for hypertension as evidenced by taking all medications as prescribed, monitoring and recording blood pressure as directed, adhering to low sodium/DASH diet patient will demonstrate improved health management independence as evidenced by checking blood pressure as directed and notifying PCP if SBP>150 or DBP > 90, taking all medications as prescribe, and adhering to a low sodium diet as discussed. patient will verbalize basic understanding of hypertension disease process and self health management plan as evidenced by compliance with heart healthy/ADA diet, compliance with medications, working with the CCM team to optimize health and well being.  Interventions:  Collaboration with Venita Lick, NP regarding  development and update of comprehensive plan of care as evidenced by provider attestation and co-signature Inter-disciplinary care team collaboration (see longitudinal plan of care) Evaluation of current treatment plan related to hypertension self management and patient's adherence to plan as established by provider. 08-18-2021: The patient is checking her blood pressures at home and they are WNL. Denies any issues with HTN at this time. Will continue to monitor.  Provided education to patient re: stroke prevention, s/s of heart attack and stroke, DASH diet, complications of uncontrolled blood pressure. 08-18-2021: Review and the patient is doing well. Denies any acute needs related to HTN management.  Reviewed medications with patient and discussed importance of compliance. 08-18-2021: Is compliant with medications.  Discussed plans with patient for ongoing care management follow up and provided patient with direct contact information for care management team Advised patient, providing education and rationale, to monitor blood pressure daily and record, calling PCP for findings outside established parameters.  Reviewed scheduled/upcoming provider appointments including: 11-09-2021 at 0820 am Self-Care Activities: - Self administers medications as prescribed Attends all scheduled provider appointments Calls provider office for new concerns, questions, or BP outside discussed parameters Checks BP and records as discussed Follows a low sodium diet/DASH diet Patient Goals: - check blood pressure 3 times per week - choose a place to take my blood pressure (home, clinic or office, retail store) - write blood pressure results in a log or diary - agree on reward when goals are met - agree to work together to make changes - ask questions to understand - have a family meeting to talk about healthy habits - learn about high blood pressure  Follow Up Plan: Telephone follow up appointment with care  management team member scheduled for: 10-27-2021 at 11:45 am    Care Plan : RNCM: HLD Management  Updates made by Vanita Ingles, RN since 08/18/2021 12:00 AM     Problem: RNCM: HLD   Priority: Medium     Long-Range Goal: RNCM: Management of HLD   Start Date: 12/15/2020  Expected End Date: 02/04/2022  This Visit's Progress: On track  Recent Progress: On track  Priority: Medium  Note:   Current Barriers:  Poorly controlled hyperlipidemia, complicated by HTN, DM2 Current antihyperlipidemic regimen: Lipitor 20 mg Most recent lipid panel:     Component Value Date/Time   CHOL 115 08/09/2021 0836   CHOL 150 05/28/2019 1018   TRIG 110 08/09/2021 0836   TRIG 92 05/28/2019 1018   HDL 50 08/09/2021 0836   CHOLHDL 2.5 07/10/2018 1503  VLDL 18 05/28/2019 1018   LDLCALC 45 08/09/2021 0836   ASCVD risk enhancing conditions: age >88, DM, HTN, former smoker Lacks Scientist, product/process development Does not contact provider office for Education officer, museum Clinical Goal(s):  patient will work with Consulting civil engineer, providers, and care team towards execution of optimized self-health management plan patient will verbalize understanding of plan for effective management of HLD patient will work with [place], and pcp  to address needs related to effective management of HLD  patient will demonstrate a decrease in HLD  exacerbations patient will take all medications exactly as prescribed and will call provider for medication related questions patient will attend all scheduled medical appointments: 11-09-2021 at 820 am patient will demonstrate improved adherence to prescribed treatment plan for HLD patient will demonstrate improved health management independence patient will demonstrate understanding of rationale for each prescribed medication the patient will demonstrate ongoing self health care management ability Interventions: Collaboration with Venita Lick, NP regarding development and update  of comprehensive plan of care as evidenced by provider attestation and co-signature Inter-disciplinary care team collaboration (see longitudinal plan of care) Medication review performed; medication list updated in electronic medical record.  Inter-disciplinary care team collaboration (see longitudinal plan of care) Referred to pharmacy team for assistance with HLD medication management Evaluation of current treatment plan related to HLD  and patient's adherence to plan as established by provider. 08-18-2021: The patient is doing well with management of her HLD and chronic conditions. The patient states that she is going to the chiropractor for right hand and arm pain and it is much better than what it was. She feels she will continue to go monthly just for adjustments. She usually likes to talk to the pcp about this but had to get some relief. She sees the kidney specialist the end of the month and is taking Vitamin D3 for bone health. Will continue to monitor for changes.  Advised patient to call the office for changes in conditions, questions or concerns Provided education to patient re: effective management of HLD, heart healthy diet, and benefit of exercise for health and well being  Reviewed medications with patient and discussed compliance  Reviewed scheduled/upcoming provider appointments including: 11-09-2021 at 0820 am Discussed plans with patient for ongoing care management follow up and provided patient with direct contact information for care management team Patient Goals/Self-Care Activities: - call for medicine refill 2 or 3 days before it runs out - call if I am sick and can't take my medicine - keep a list of all the medicines I take; vitamins and herbals too - learn to read medicine labels - use a pillbox to sort medicine - use an alarm clock or phone to remind me to take my medicine - change to whole grain breads, cereal, pasta - drink 6 to 8 glasses of water each day - eat 3 to  5 servings of fruits and vegetables each day - eat 5 or 6 small meals each day - eat fish at least once per week - fill half the plate with nonstarchy vegetables - limit fast food meals to no more than 1 per week - manage portion size - prepare main meal at home 3 to 5 days each week - read food labels for fat, fiber, carbohydrates and portion size - be open to making changes - I can manage, know and watch for signs of a heart attack - if I have chest pain, call for help - learn about small changes  that will make a big difference - learn my personal risk factors  Follow Up Plan: Telephone follow up appointment with care management team member scheduled for:10-27-2021 at 11:45 am       Plan:Telephone follow up appointment with care management team member scheduled for:  10-27-2021 at 24 am  Noreene Larsson RN, MSN, [facility] Mobile: [phone]

## 2021-08-23 DIAGNOSIS — M9905 Segmental and somatic dysfunction of pelvic region: Secondary | ICD-10-CM | POA: Diagnosis not present

## 2021-08-23 DIAGNOSIS — M5432 Sciatica, left side: Secondary | ICD-10-CM | POA: Diagnosis not present

## 2021-08-23 DIAGNOSIS — M9903 Segmental and somatic dysfunction of lumbar region: Secondary | ICD-10-CM | POA: Diagnosis not present

## 2021-08-23 DIAGNOSIS — M5416 Radiculopathy, lumbar region: Secondary | ICD-10-CM | POA: Diagnosis not present

## 2021-08-26 DIAGNOSIS — I10 Essential (primary) hypertension: Secondary | ICD-10-CM | POA: Diagnosis not present

## 2021-08-26 DIAGNOSIS — N1831 Chronic kidney disease, stage 3a: Secondary | ICD-10-CM | POA: Diagnosis not present

## 2021-08-26 DIAGNOSIS — E1122 Type 2 diabetes mellitus with diabetic chronic kidney disease: Secondary | ICD-10-CM | POA: Diagnosis not present

## 2021-08-26 DIAGNOSIS — E872 Acidosis: Secondary | ICD-10-CM | POA: Diagnosis not present

## 2021-08-27 DIAGNOSIS — E1169 Type 2 diabetes mellitus with other specified complication: Secondary | ICD-10-CM

## 2021-08-27 DIAGNOSIS — E785 Hyperlipidemia, unspecified: Secondary | ICD-10-CM

## 2021-08-27 DIAGNOSIS — E1159 Type 2 diabetes mellitus with other circulatory complications: Secondary | ICD-10-CM | POA: Diagnosis not present

## 2021-08-27 DIAGNOSIS — I152 Hypertension secondary to endocrine disorders: Secondary | ICD-10-CM

## 2021-08-27 DIAGNOSIS — E1121 Type 2 diabetes mellitus with diabetic nephropathy: Secondary | ICD-10-CM | POA: Diagnosis not present

## 2021-08-30 DIAGNOSIS — M9903 Segmental and somatic dysfunction of lumbar region: Secondary | ICD-10-CM | POA: Diagnosis not present

## 2021-08-30 DIAGNOSIS — M9905 Segmental and somatic dysfunction of pelvic region: Secondary | ICD-10-CM | POA: Diagnosis not present

## 2021-08-30 DIAGNOSIS — M5416 Radiculopathy, lumbar region: Secondary | ICD-10-CM | POA: Diagnosis not present

## 2021-08-30 DIAGNOSIS — M5432 Sciatica, left side: Secondary | ICD-10-CM | POA: Diagnosis not present

## 2021-09-09 ENCOUNTER — Other Ambulatory Visit: Payer: Self-pay | Admitting: Nurse Practitioner

## 2021-09-09 NOTE — Telephone Encounter (Signed)
Requested Prescriptions  Pending Prescriptions Disp Refills  . ONETOUCH ULTRA test strip [employer] Med Name: OneTouch Ultra In Vitro Strip] 100 strip 3    Sig: USE TO CHECK BLOOD SUGAR  DAILY     Endocrinology: Diabetes - Testing Supplies Passed - 09/09/2021 12:28 AM      Passed - Valid encounter within last 12 months    Recent Outpatient Visits          1 month ago Type 2 diabetes mellitus with diabetic nephropathy, without long-term current use of insulin (HCC)   [facility] [place], [place] T, NP   1 month ago Acute left-sided low back pain with left-sided sciatica   [facility], Megan P, DO   4 months ago Type 2 diabetes mellitus with diabetic nephropathy, without long-term current use of insulin (HCC)   [facility] [place], Jolene T, NP   7 months ago Type 2 diabetes mellitus with diabetic nephropathy, without long-term current use of insulin (HCC)   [facility] Cannady, [place] T, NP   7 months ago Sinus pressure   [facility] [place], Dorie Rank, NP      Future Appointments            In 2 months Cannady, Dorie Rank, NP [facility]   In 11 months  [facility]

## 2021-09-13 DIAGNOSIS — M5416 Radiculopathy, lumbar region: Secondary | ICD-10-CM | POA: Diagnosis not present

## 2021-09-13 DIAGNOSIS — M5432 Sciatica, left side: Secondary | ICD-10-CM | POA: Diagnosis not present

## 2021-09-13 DIAGNOSIS — M9903 Segmental and somatic dysfunction of lumbar region: Secondary | ICD-10-CM | POA: Diagnosis not present

## 2021-09-13 DIAGNOSIS — M9905 Segmental and somatic dysfunction of pelvic region: Secondary | ICD-10-CM | POA: Diagnosis not present

## 2021-09-15 ENCOUNTER — Other Ambulatory Visit: Payer: Self-pay | Admitting: Nurse Practitioner

## 2021-09-16 NOTE — Telephone Encounter (Signed)
Requested Prescriptions  Pending Prescriptions Disp Refills  . JARDIANCE 25 MG TABS tablet [Pharmacy Med Name: Jardiance 25 MG Oral Tablet] 90 tablet 1    Sig: TAKE 1 TABLET BY MOUTH  DAILY     Endocrinology:  Diabetes - SGLT2 Inhibitors Failed - 09/15/2021 11:33 PM      Failed - Cr in normal range and within 360 days    Creatinine, Ser  Date Value Ref Range Status  08/09/2021 1.16 (H) 0.57 - 1.00 mg/dL Final         Failed - AA eGFR in normal range and within 360 days    GFR calc Af Amer  Date Value Ref Range Status  10/27/2020 55 (L) >59 mL/min/1.73 Final    Comment:    **In accordance with recommendations from the NKF-ASN Task force,**   [facility] is in the process of updating its eGFR calculation to the   2021 CKD-EPI creatinine equation that estimates kidney function   without a race variable.    GFR calc non Af Amer  Date Value Ref Range Status  10/27/2020 47 (L) >59 mL/min/1.73 Final   eGFR  Date Value Ref Range Status  08/09/2021 52 (L) >59 mL/min/1.73 Final         Passed - LDL in normal range and within 360 days    LDL Chol Calc (NIH)  Date Value Ref Range Status  08/09/2021 45 0 - 99 mg/dL Final         Passed - HBA1C is between 0 and 7.9 and within 180 days    Hemoglobin A1C  Date Value Ref Range Status  03/17/2016 7.2  Final   HB A1C ([facility] DCA - WAIVED)  Date Value Ref Range Status  08/09/2021 6.1 (H) 4.8 - 5.6 % Final    Comment:             Prediabetes: 5.7 - 6.4          Diabetes: >6.4          Glycemic control for adults with diabetes: <7.0               **Please note reference interval change**          Passed - Valid encounter within last 6 months    Recent Outpatient Visits          1 month ago Type 2 diabetes mellitus with diabetic nephropathy, without long-term current use of insulin ([place])   [place], [place] T, NP   2 months ago Acute left-sided low back pain with left-sided sciatica   [facility], Megan P, DO   4 months ago Type 2 diabetes mellitus with diabetic nephropathy, without long-term current use of insulin ([place])   [place], Jolene T, NP   7 months ago Type 2 diabetes mellitus with diabetic nephropathy, without long-term current use of insulin ([place])   [place] Cannady, [place] T, NP   7 months ago Sinus pressure   [place], [name], NP      Future Appointments            In 1 month Cannady, [name], NP [facility]   In 26 months  [facility]

## 2021-09-22 ENCOUNTER — Other Ambulatory Visit: Payer: Self-pay | Admitting: Nurse Practitioner

## 2021-09-23 NOTE — Telephone Encounter (Signed)
Requested Prescriptions  Pending Prescriptions Disp Refills  . gabapentin (NEURONTIN) 400 MG capsule [Pharmacy Med Name: Gabapentin 400 MG Oral Capsule] 270 capsule 3    Sig: TAKE 1 CAPSULE BY MOUTH 3  TIMES DAILY     Neurology: Anticonvulsants - gabapentin Passed - 09/22/2021 10:31 PM      Passed - Valid encounter within last 12 months    Recent Outpatient Visits          1 month ago Type 2 diabetes mellitus with diabetic nephropathy, without long-term current use of insulin (HCC)   [facility] [place], [place] T, NP   2 months ago Acute left-sided low back pain with left-sided sciatica   [facility], Megan P, DO   4 months ago Type 2 diabetes mellitus with diabetic nephropathy, without long-term current use of insulin (HCC)   [facility] [place], Jolene T, NP   7 months ago Type 2 diabetes mellitus with diabetic nephropathy, without long-term current use of insulin (HCC)   [facility] Cannady, [place] T, NP   8 months ago Sinus pressure   [facility] [place], Dorie Rank, NP      Future Appointments            In 1 month Cannady, Dorie Rank, NP [facility]   In 11 months  [facility]

## 2021-09-29 DIAGNOSIS — M5432 Sciatica, left side: Secondary | ICD-10-CM | POA: Diagnosis not present

## 2021-09-29 DIAGNOSIS — M5416 Radiculopathy, lumbar region: Secondary | ICD-10-CM | POA: Diagnosis not present

## 2021-09-29 DIAGNOSIS — M9903 Segmental and somatic dysfunction of lumbar region: Secondary | ICD-10-CM | POA: Diagnosis not present

## 2021-09-29 DIAGNOSIS — M9905 Segmental and somatic dysfunction of pelvic region: Secondary | ICD-10-CM | POA: Diagnosis not present

## 2021-10-01 DIAGNOSIS — M5432 Sciatica, left side: Secondary | ICD-10-CM | POA: Diagnosis not present

## 2021-10-01 DIAGNOSIS — M9905 Segmental and somatic dysfunction of pelvic region: Secondary | ICD-10-CM | POA: Diagnosis not present

## 2021-10-01 DIAGNOSIS — M9903 Segmental and somatic dysfunction of lumbar region: Secondary | ICD-10-CM | POA: Diagnosis not present

## 2021-10-01 DIAGNOSIS — M5416 Radiculopathy, lumbar region: Secondary | ICD-10-CM | POA: Diagnosis not present

## 2021-10-04 DIAGNOSIS — M5432 Sciatica, left side: Secondary | ICD-10-CM | POA: Diagnosis not present

## 2021-10-04 DIAGNOSIS — M9903 Segmental and somatic dysfunction of lumbar region: Secondary | ICD-10-CM | POA: Diagnosis not present

## 2021-10-04 DIAGNOSIS — M5416 Radiculopathy, lumbar region: Secondary | ICD-10-CM | POA: Diagnosis not present

## 2021-10-04 DIAGNOSIS — M9905 Segmental and somatic dysfunction of pelvic region: Secondary | ICD-10-CM | POA: Diagnosis not present

## 2021-10-06 DIAGNOSIS — M9905 Segmental and somatic dysfunction of pelvic region: Secondary | ICD-10-CM | POA: Diagnosis not present

## 2021-10-06 DIAGNOSIS — M5432 Sciatica, left side: Secondary | ICD-10-CM | POA: Diagnosis not present

## 2021-10-06 DIAGNOSIS — M5416 Radiculopathy, lumbar region: Secondary | ICD-10-CM | POA: Diagnosis not present

## 2021-10-06 DIAGNOSIS — M9903 Segmental and somatic dysfunction of lumbar region: Secondary | ICD-10-CM | POA: Diagnosis not present

## 2021-10-11 DIAGNOSIS — M5416 Radiculopathy, lumbar region: Secondary | ICD-10-CM | POA: Diagnosis not present

## 2021-10-11 DIAGNOSIS — M9903 Segmental and somatic dysfunction of lumbar region: Secondary | ICD-10-CM | POA: Diagnosis not present

## 2021-10-11 DIAGNOSIS — M9905 Segmental and somatic dysfunction of pelvic region: Secondary | ICD-10-CM | POA: Diagnosis not present

## 2021-10-11 DIAGNOSIS — M5432 Sciatica, left side: Secondary | ICD-10-CM | POA: Diagnosis not present

## 2021-10-13 DIAGNOSIS — M9905 Segmental and somatic dysfunction of pelvic region: Secondary | ICD-10-CM | POA: Diagnosis not present

## 2021-10-13 DIAGNOSIS — M5432 Sciatica, left side: Secondary | ICD-10-CM | POA: Diagnosis not present

## 2021-10-13 DIAGNOSIS — M9903 Segmental and somatic dysfunction of lumbar region: Secondary | ICD-10-CM | POA: Diagnosis not present

## 2021-10-13 DIAGNOSIS — M5416 Radiculopathy, lumbar region: Secondary | ICD-10-CM | POA: Diagnosis not present

## 2021-10-18 DIAGNOSIS — M5432 Sciatica, left side: Secondary | ICD-10-CM | POA: Diagnosis not present

## 2021-10-18 DIAGNOSIS — M9903 Segmental and somatic dysfunction of lumbar region: Secondary | ICD-10-CM | POA: Diagnosis not present

## 2021-10-18 DIAGNOSIS — M9905 Segmental and somatic dysfunction of pelvic region: Secondary | ICD-10-CM | POA: Diagnosis not present

## 2021-10-18 DIAGNOSIS — M5416 Radiculopathy, lumbar region: Secondary | ICD-10-CM | POA: Diagnosis not present

## 2021-10-26 DIAGNOSIS — M9905 Segmental and somatic dysfunction of pelvic region: Secondary | ICD-10-CM | POA: Diagnosis not present

## 2021-10-26 DIAGNOSIS — M5416 Radiculopathy, lumbar region: Secondary | ICD-10-CM | POA: Diagnosis not present

## 2021-10-26 DIAGNOSIS — M9903 Segmental and somatic dysfunction of lumbar region: Secondary | ICD-10-CM | POA: Diagnosis not present

## 2021-10-26 DIAGNOSIS — M5432 Sciatica, left side: Secondary | ICD-10-CM | POA: Diagnosis not present

## 2021-10-27 ENCOUNTER — Telehealth: Payer: Medicare Other

## 2021-10-27 ENCOUNTER — Ambulatory Visit (INDEPENDENT_AMBULATORY_CARE_PROVIDER_SITE_OTHER): Payer: Medicare Other

## 2021-10-27 DIAGNOSIS — M25512 Pain in left shoulder: Secondary | ICD-10-CM

## 2021-10-27 DIAGNOSIS — Z87891 Personal history of nicotine dependence: Secondary | ICD-10-CM

## 2021-10-27 DIAGNOSIS — M85852 Other specified disorders of bone density and structure, left thigh: Secondary | ICD-10-CM

## 2021-10-27 DIAGNOSIS — E1121 Type 2 diabetes mellitus with diabetic nephropathy: Secondary | ICD-10-CM | POA: Diagnosis not present

## 2021-10-27 DIAGNOSIS — E1159 Type 2 diabetes mellitus with other circulatory complications: Secondary | ICD-10-CM | POA: Diagnosis not present

## 2021-10-27 DIAGNOSIS — G8929 Other chronic pain: Secondary | ICD-10-CM

## 2021-10-27 DIAGNOSIS — E1169 Type 2 diabetes mellitus with other specified complication: Secondary | ICD-10-CM | POA: Diagnosis not present

## 2021-10-27 DIAGNOSIS — I152 Hypertension secondary to endocrine disorders: Secondary | ICD-10-CM | POA: Diagnosis not present

## 2021-10-27 DIAGNOSIS — Z7984 Long term (current) use of oral hypoglycemic drugs: Secondary | ICD-10-CM

## 2021-10-27 DIAGNOSIS — E785 Hyperlipidemia, unspecified: Secondary | ICD-10-CM

## 2021-10-27 NOTE — Patient Instructions (Signed)
Visit Information  Thank you for taking time to visit with me today. Please don't hesitate to contact me if I can be of assistance to you before our next scheduled telephone appointment.  Following are the goals we discussed today:  ( Current Barriers:  Knowledge Deficits related to plan of care for management of HTN, HLD, and DMII  Chronic Disease Management support and education needs related to HTN, HLD, and DMII  RNCM Clinical Goal(s):  Patient will verbalize basic understanding of HTN, HLD, Chronic pain, and DMII disease process and self health management plan as evidenced by keeping appointments, following the plan of care, calling the office for changes or questions, and working with the CCM team to optimize health and well being  take all medications exactly as prescribed and will call provider for medication related questions as evidenced by compliance and calling for refills before running out of medications     attend all scheduled medical appointments: 11-09-2021 at 0820 am  as evidenced by keeping appointments and calling the office for schedule change needs         demonstrate a decrease in HTN, HLD, chronic pain, and DMII exacerbations  as evidenced by stable conditions, and working with the pcp and CCM team to optimize health and well being  demonstrate ongoing self health care management ability to effectively manage chronic conditions  as evidenced by working with the CCM team through collaboration with Consulting civil engineer, provider, and care team.   Interventions: 1:1 collaboration with primary care provider regarding development and update of comprehensive plan of care as evidenced by provider attestation and co-signature Inter-disciplinary care team collaboration (see longitudinal plan of care) Evaluation of current treatment plan related to  self management and patient's adherence to plan as established by provider   Diabetes:  (Status: Goal on Track (progressing): YES.) Long  Term Goal   Lab Results  Component Value Date   HGBA1C 6.1 (H) 08/09/2021  Assessed patient's understanding of A1c goal: <7% Provided education to patient about basic DM disease process; Reviewed medications with patient and discussed importance of medication adherence;        Reviewed prescribed diet with patient heart healthy/ADA ; Counseled on importance of regular laboratory monitoring as prescribed;        Discussed plans with patient for ongoing care management follow up and provided patient with direct contact information for care management team;      Provided patient with written educational materials related to hypo and hyperglycemia and importance of correct treatment;       Reviewed scheduled/upcoming provider appointments including: 11-09-2021 at 0820 am;         Advised patient, providing education and rationale, to check cbg when you have symptoms of low or high blood sugar, before and after exercise, and as directed   and record        call provider for findings outside established parameters;       Review of patient status, including review of consultants reports, relevant laboratory and other test results, and medications completed;       Screening for signs and symptoms of depression related to chronic disease state;        Assessed social determinant of health barriers;         Hyperlipidemia:  (Status: Goal on Track (progressing): YES.) Long Term Goal  Lab Results  Component Value Date   CHOL 115 08/09/2021   HDL 50 08/09/2021   LDLCALC 45 08/09/2021   TRIG  110 08/09/2021   CHOLHDL 2.5 07/10/2018     Medication review performed; medication list updated in electronic medical record.  Provider established cholesterol goals reviewed; Counseled on importance of regular laboratory monitoring as prescribed; Provided HLD educational materials; Reviewed role and benefits of statin for ASCVD risk reduction; Discussed strategies to manage statin-induced  myalgias; Reviewed importance of limiting foods high in cholesterol;  Hypertension: (Status: Goal on Track (progressing): YES.) Last practice recorded BP readings:  BP Readings from Last 3 Encounters:  08/09/21 121/71  07/14/21 113/74  05/06/21 121/71  Most recent eGFR/CrCl:  Lab Results  Component Value Date   EGFR 52 (L) 08/09/2021    No components found for: CRCL  Evaluation of current treatment plan related to hypertension self management and patient's adherence to plan as established by provider;   Provided education to patient re: stroke prevention, s/s of heart attack and stroke; Reviewed prescribed diet heart healthy/ADA diet  Reviewed medications with patient and discussed importance of compliance;  Discussed plans with patient for ongoing care management follow up and provided patient with direct contact information for care management team; Advised patient, providing education and rationale, to monitor blood pressure daily and record, calling PCP for findings outside established parameters;  Provided education on prescribed diet heart healthy/ADA;  Discussed complications of poorly controlled blood pressure such as heart disease, stroke, circulatory complications, vision complications, kidney impairment, sexual dysfunction;    Pain:  (Status: Goal on Track (progressing): YES.) Long Term Goal  Pain assessment performed. 10-27-2021: Rates her pain at a 5 today on a scale of 0-10. States the weather changes impact her pain level but she has a high tolerance Medications reviewed. 10-27-2021: Is compliant with medications  Reviewed provider established plan for pain management.  10-27-2021: The patient has been seeing the chiropractor and this is working well for her. He has given her stretching exercises to help with management of her shoulder pain and discomfort; Discussed importance of adherence to all scheduled medical appointments; Counseled on the importance of reporting  any/all new or changed pain symptoms or management strategies to pain management provider; Advised patient to report to care team affect of pain on daily activities; Discussed use of relaxation techniques and/or diversional activities to assist with pain reduction (distraction, imagery, relaxation, massage, acupressure, TENS, heat, and cold application. [id number]: Uses heat application to help with pain relief in her shoulders; Reviewed with patient prescribed pharmacological and nonpharmacological pain relief strategies; Advised patient to discuss uncontrolled pain, changes in level or intensity of pain  with provider;   Patient Goals/Self-Care Activities: Take medications as prescribed   Attend all scheduled provider appointments Call pharmacy for medication refills 3-7 days in advance of running out of medications Attend church or other social activities Perform all self care activities independently  Perform IADL's (shopping, preparing meals, housekeeping, managing finances) independently Call provider office for new concerns or questions  Work with the social worker to address care coordination needs and will continue to work with the clinical team to address health care and disease management related needs call the Suicide and Crisis Lifeline: 988 call the [place] National Suicide Prevention Lifeline: (671)143-6801 or TTY: 878-565-3537 TTY 510-834-1493) to talk to a trained counselor call 1-800-273-TALK (toll free, 24 hour hotline) if experiencing a Mental Health or [place]  schedule appointment with eye doctor check blood sugar at prescribed times: before meals and at bedtime, when you have symptoms of low or high blood sugar, and before and after exercise check  feet daily for cuts, sores or redness enter blood sugar readings and medication or insulin into daily log take the blood sugar log to all doctor visits set goal weight trim toenails straight across drink 6 to  8 glasses of water each day eat fish at least once per week fill half of plate with vegetables limit fast food meals to no more than 1 per week manage portion size prepare main meal at home 3 to 5 days each week read food labels for fat, fiber, carbohydrates and portion size reduce red meat to 2 to 3 times a week set a realistic goal do heel pump exercise 2 to 3 times each day keep feet up while sitting wash and dry feet carefully every day wear comfortable, cotton socks wear comfortable, well-fitting shoes check blood pressure weekly choose a place to take my blood pressure (home, clinic or office, retail store) write blood pressure results in a log or diary learn about high blood pressure keep a blood pressure log take blood pressure log to all doctor appointments call doctor for signs and symptoms of high blood pressure develop an action plan for high blood pressure keep all doctor appointments take medications for blood pressure exactly as prescribed report new symptoms to your doctor eat more whole grains, fruits and vegetables, lean meats and healthy fats - call for medicine refill 2 or 3 days before it runs out - take all medications exactly as prescribed - call doctor with any symptoms you believe are related to your medicine - call doctor when you experience any new symptoms - go to all doctor appointments as scheduled - adhere to prescribed diet: heart healthy/ADA diet        Our next appointment is by telephone on 12-29-2021 at 1145 am  Please call the care guide team at [phone] if you need to cancel or reschedule your appointment.   If you are experiencing a Mental Health or [place] or need someone to talk to, please call the Suicide and Crisis Lifeline: 988 call the [place] National Suicide Prevention Lifeline: 986-714-3740 or TTY: 647-784-8161 TTY (249)718-0436) to talk to a trained counselor call 1-800-273-TALK (toll free, 24 hour hotline)    Patient verbalizes understanding of instructions provided today and agrees to view in [place].   Noreene Larsson RN, MSN, [facility] Mobile: [phone]

## 2021-10-27 NOTE — Chronic Care Management (AMB) (Signed)
Chronic Care Management   CCM RN Visit Note  10/27/2021 Name: Kim Thomas MRN: [id number] DOB: Jan 10, 1956  Subjective: Kim Thomas is a 65 y.o. year old female who is a primary care patient of Cannady, [name], NP. The care management team was consulted for assistance with disease management and care coordination needs.    Engaged with patient by telephone for follow up visit in response to provider referral for case management and/or care coordination services.   Consent to Services:  The patient was given information about Chronic Care Management services, agreed to services, and gave verbal consent prior to initiation of services.  Please see initial visit note for detailed documentation.   Patient agreed to services and verbal consent obtained.   Assessment: Review of patient past medical history, allergies, medications, health status, including review of consultants reports, laboratory and other test data, was performed as part of comprehensive evaluation and provision of chronic care management services.   SDOH (Social Determinants of Health) assessments and interventions performed:    CCM Care Plan  Allergies  Allergen Reactions   Morphine And Related Itching    It burned, and made patient itch and go out of her mind.   Erythromycin     "Been so long ago," patient unable to remember reaction   Hydrocodone-Acetaminophen Itching    Outpatient Encounter Medications as of 10/27/2021  Medication Sig Note   acetaminophen (TYLENOL) 500 MG tablet Take 500 mg by mouth daily as needed. 07/13/2020: Taking one tablet twice a day.   ascorbic acid (VITAMIN C) 1000 MG tablet Take by mouth.    Aspirin (VAZALORE) 81 MG CAPS Take by mouth.    atorvastatin (LIPITOR) 20 MG tablet TAKE 1 TABLET BY MOUTH  DAILY    baclofen (LIORESAL) 10 MG tablet Take 1 tablet (10 mg total) by mouth at bedtime as needed for muscle spasms.    Blood Glucose Monitoring Suppl (ONE TOUCH ULTRA 2) w/Device KIT  Use to check blood sugar 3-4 times daily and document for visits. ICD E11.21    CALCIUM CITRATE PO Take 1,000 mg by mouth daily.    Cholecalciferol (VITAMIN D3) 2000 units TABS Take 2,000 Units by mouth daily.    Co-Enzyme Q-10 100 MG CAPS Take 50 mg by mouth daily.    diclofenac sodium (VOLTAREN) 1 % GEL Apply topically.    EQ ALLERGY RELIEF, CETIRIZINE, 10 MG tablet Take 1 tablet by mouth once daily    fluticasone (FLONASE) 50 MCG/ACT nasal spray USE 1 TO 2 SPRAY(S) IN EACH NOSTRIL TWICE DAILY    gabapentin (NEURONTIN) 400 MG capsule TAKE 1 CAPSULE BY MOUTH 3  TIMES DAILY    JARDIANCE 25 MG TABS tablet TAKE 1 TABLET BY MOUTH  DAILY    Lancets (ONETOUCH DELICA PLUS [id number]) MISC 1 each by Does not apply route 2 (two) times daily.    Magnesium 400 MG TABS Take by mouth.    Multiple Vitamin (MULTIVITAMIN) tablet Take 1 tablet by mouth daily.    naproxen (NAPROSYN) 500 MG tablet Take 1 tablet (500 mg total) by mouth 2 (two) times daily with a meal.    Omega-3 1000 MG CAPS Take 1 capsule by mouth daily.    omeprazole (PRILOSEC) 20 MG capsule TAKE 1 CAPSULE BY MOUTH  DAILY    ONETOUCH ULTRA test strip USE TO CHECK BLOOD SUGAR  DAILY    Potassium 99 MG TABS Take by mouth.    Semaglutide,0.25 or 0.5MG/DOS, (OZEMPIC, 0.25 OR 0.5 MG/DOSE,) 2  MG/1.5ML SOPN Inject 0.25 mg into the skin once a week. Start with 0.25 MG once a week injected into skin x 4 weeks, then increase to 0.5 MG weekly into skin.    sodium bicarbonate 650 MG tablet Take by mouth.    telmisartan (MICARDIS) 40 MG tablet TAKE 1 TABLET BY MOUTH  DAILY    triamcinolone cream (KENALOG) 0.1 % Apply 1 application topically 2 (two) times daily.    vitamin E 100 UNIT capsule Take 100 Units by mouth daily.    No facility-administered encounter medications on file as of 10/27/2021.    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/25/2016   Hypertension associated with diabetes ([place]) 05/25/2016   Arthritis of hand, right 05/25/2016   Sleep apnea 05/25/2016   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    Conditions to be addressed/monitored:HTN, HLD, DMII, and chronic pain   Care Plan : RNCM: General Plan of Care (Adult) for Chronic Disease Management and Care Coordination Needs  Updates made by Vanita Ingles, RN since 10/27/2021 12:00 AM     Problem: RNCM: Development of Plan of Care for Chronic Disease Management (HTN, HLD, DM2, Chronic pain)   Priority: High     Long-Range Goal: RNCM: Effective Management  of Plan of Care for Chronic Disease Management (HTN, HLD, DM2, chronic pain)   Start Date: 10/27/2021  Expected End Date: 10/27/2022  Priority: High  Note:   Current Barriers:  Knowledge Deficits related to plan of care for management of HTN, HLD, and DMII  Chronic Disease Management support and education needs related to HTN, HLD, and DMII  RNCM Clinical Goal(s):  Patient will verbalize basic understanding of HTN, HLD, Chronic pain, and DMII disease process and self health management plan as evidenced by keeping appointments, following the plan of care, calling the office for changes or questions, and working with the CCM team to optimize health and well being  take all medications exactly as prescribed and will call provider for medication related questions as evidenced by compliance and calling for refills before running out of medications     attend all scheduled medical appointments: 11-09-2021 at  0820 am  as evidenced by keeping appointments and calling the office for schedule change needs         demonstrate a decrease in HTN, HLD, chronic pain, and DMII exacerbations  as evidenced by stable conditions, and working with the pcp and CCM team to optimize health and well being  demonstrate ongoing self health care management ability to effectively manage chronic conditions  as evidenced by working with the CCM team through collaboration with Consulting civil engineer, provider, and care team.   Interventions: 1:1 collaboration with primary care provider regarding development and update of comprehensive plan of care as evidenced by provider attestation and co-signature Inter-disciplinary care team collaboration (see longitudinal plan of care) Evaluation of current treatment plan related to  self management and patient's adherence to plan as established by provider  Diabetes:  (Status: Goal on Track (progressing): YES.) Long Term Goal   Lab Results  Component Value Date   HGBA1C 6.1 (H) 08/09/2021  Assessed patient's understanding of A1c goal: <7% Provided education to patient about basic DM disease process; Reviewed medications with patient and discussed importance of medication adherence;        Reviewed prescribed diet with patient heart healthy/ADA ; Counseled on importance of regular laboratory monitoring as prescribed;        Discussed plans with patient for ongoing care management follow up and provided patient with direct contact information for care management team;      Provided patient with written educational materials related to hypo and hyperglycemia and importance of correct treatment;       Reviewed scheduled/upcoming provider appointments including: 11-09-2021 at 0820 am;         Advised patient, providing education and rationale, to check cbg when you have symptoms of low or high blood sugar, before and after exercise, and as directed   and record        call provider for  findings outside established parameters;       Review of patient status, including review of consultants reports, relevant laboratory and other test results, and medications completed;       Screening for signs and symptoms of depression related to chronic disease state;        Assessed social determinant of health barriers;         Hyperlipidemia:  (Status: Goal on Track (progressing): YES.) Long Term Goal  Lab Results  Component Value Date   CHOL 115 08/09/2021   HDL 50 08/09/2021   LDLCALC 45 08/09/2021   TRIG 110 08/09/2021   CHOLHDL 2.5 07/10/2018     Medication review performed; medication list updated in electronic medical record.  Provider established cholesterol goals reviewed; Counseled on importance of regular laboratory monitoring as prescribed; Provided HLD educational materials; Reviewed role and benefits of statin for ASCVD risk reduction; Discussed strategies to manage statin-induced myalgias; Reviewed importance of limiting foods high in cholesterol;  Hypertension: (Status: Goal on Track (progressing): YES.) Last practice recorded BP readings:  BP Readings from Last 3 Encounters:  08/09/21 121/71  07/14/21 113/74  05/06/21 121/71  Most recent eGFR/CrCl:  Lab Results  Component Value Date   EGFR 52 (L) 08/09/2021    No components found for: CRCL  Evaluation of current treatment plan related to hypertension self management and patient's adherence to plan as established by provider;   Provided education to patient re: stroke prevention, s/s of heart attack and stroke; Reviewed prescribed diet heart healthy/ADA diet  Reviewed medications with patient and discussed importance of compliance;  Discussed plans with patient for ongoing care management follow up and provided patient with direct contact information for care management team; Advised patient, providing education and rationale, to monitor blood pressure daily and record, calling PCP for findings outside  established parameters;  Provided education on prescribed diet heart healthy/ADA;  Discussed complications of poorly controlled blood pressure such as heart disease, stroke, circulatory complications, vision complications, kidney impairment, sexual dysfunction;    Pain:  (Status: Goal on Track (progressing): YES.) Long Term Goal  Pain assessment performed. 10-27-2021: Rates her pain at a 5 today on a scale of 0-10. States the weather changes impact her pain level but she has a high tolerance Medications reviewed. 10-27-2021: Is compliant with medications  Reviewed provider established plan for pain management.  10-27-2021: The patient has been seeing  the chiropractor and this is working well for her. He has given her stretching exercises to help with management of her shoulder pain and discomfort; Discussed importance of adherence to all scheduled medical appointments; Counseled on the importance of reporting any/all new or changed pain symptoms or management strategies to pain management provider; Advised patient to report to care team affect of pain on daily activities; Discussed use of relaxation techniques and/or diversional activities to assist with pain reduction (distraction, imagery, relaxation, massage, acupressure, TENS, heat, and cold application. [id number]: Uses heat application to help with pain relief in her shoulders; Reviewed with patient prescribed pharmacological and nonpharmacological pain relief strategies; Advised patient to discuss uncontrolled pain, changes in level or intensity of pain  with provider;   Patient Goals/Self-Care Activities: Take medications as prescribed   Attend all scheduled provider appointments Call pharmacy for medication refills 3-7 days in advance of running out of medications Attend church or other social activities Perform all self care activities independently  Perform IADL's (shopping, preparing meals, housekeeping, managing finances)  independently Call provider office for new concerns or questions  Work with the social worker to address care coordination needs and will continue to work with the clinical team to address health care and disease management related needs call the Suicide and Crisis Lifeline: 988 call the [place] National Suicide Prevention Lifeline: 579-199-6690 or TTY: 850-217-0251 TTY 229-537-3878) to talk to a trained counselor call 1-800-273-TALK (toll free, 24 hour hotline) if experiencing a Mental Health or [place]  schedule appointment with eye doctor check blood sugar at prescribed times: before meals and at bedtime, when you have symptoms of low or high blood sugar, and before and after exercise check feet daily for cuts, sores or redness enter blood sugar readings and medication or insulin into daily log take the blood sugar log to all doctor visits set goal weight trim toenails straight across drink 6 to 8 glasses of water each day eat fish at least once per week fill half of plate with vegetables limit fast food meals to no more than 1 per week manage portion size prepare main meal at home 3 to 5 days each week read food labels for fat, fiber, carbohydrates and portion size reduce red meat to 2 to 3 times a week set a realistic goal do heel pump exercise 2 to 3 times each day keep feet up while sitting wash and dry feet carefully every day wear comfortable, cotton socks wear comfortable, well-fitting shoes check blood pressure weekly choose a place to take my blood pressure (home, clinic or office, retail store) write blood pressure results in a log or diary learn about high blood pressure keep a blood pressure log take blood pressure log to all doctor appointments call doctor for signs and symptoms of high blood pressure develop an action plan for high blood pressure keep all doctor appointments take medications for blood pressure exactly as prescribed report new  symptoms to your doctor eat more whole grains, fruits and vegetables, lean meats and healthy fats - call for medicine refill 2 or 3 days before it runs out - take all medications exactly as prescribed - call doctor with any symptoms you believe are related to your medicine - call doctor when you experience any new symptoms - go to all doctor appointments as scheduled - adhere to prescribed diet: heart healthy/ADA diet        Plan:Telephone follow up appointment with care management team member scheduled for:  12-29-2021 at  [place] am  [place], MSN, [facility] Mobile: [phone]

## 2021-11-09 ENCOUNTER — Other Ambulatory Visit: Payer: Self-pay

## 2021-11-09 ENCOUNTER — Encounter: Payer: Self-pay | Admitting: Nurse Practitioner

## 2021-11-09 ENCOUNTER — Ambulatory Visit (INDEPENDENT_AMBULATORY_CARE_PROVIDER_SITE_OTHER): Payer: Medicare Other | Admitting: Nurse Practitioner

## 2021-11-09 VITALS — BP 115/73 | HR 91 | Temp 98.2°F | Ht 65.0 in | Wt 194.6 lb

## 2021-11-09 DIAGNOSIS — M5416 Radiculopathy, lumbar region: Secondary | ICD-10-CM | POA: Diagnosis not present

## 2021-11-09 DIAGNOSIS — M9903 Segmental and somatic dysfunction of lumbar region: Secondary | ICD-10-CM | POA: Diagnosis not present

## 2021-11-09 DIAGNOSIS — E1121 Type 2 diabetes mellitus with diabetic nephropathy: Secondary | ICD-10-CM

## 2021-11-09 DIAGNOSIS — E559 Vitamin D deficiency, unspecified: Secondary | ICD-10-CM

## 2021-11-09 DIAGNOSIS — E1159 Type 2 diabetes mellitus with other circulatory complications: Secondary | ICD-10-CM | POA: Diagnosis not present

## 2021-11-09 DIAGNOSIS — I152 Hypertension secondary to endocrine disorders: Secondary | ICD-10-CM | POA: Diagnosis not present

## 2021-11-09 DIAGNOSIS — E790 Hyperuricemia without signs of inflammatory arthritis and tophaceous disease: Secondary | ICD-10-CM

## 2021-11-09 DIAGNOSIS — E042 Nontoxic multinodular goiter: Secondary | ICD-10-CM

## 2021-11-09 DIAGNOSIS — E1122 Type 2 diabetes mellitus with diabetic chronic kidney disease: Secondary | ICD-10-CM | POA: Diagnosis not present

## 2021-11-09 DIAGNOSIS — M9905 Segmental and somatic dysfunction of pelvic region: Secondary | ICD-10-CM | POA: Diagnosis not present

## 2021-11-09 DIAGNOSIS — Z6832 Body mass index (BMI) 32.0-32.9, adult: Secondary | ICD-10-CM

## 2021-11-09 DIAGNOSIS — G4733 Obstructive sleep apnea (adult) (pediatric): Secondary | ICD-10-CM

## 2021-11-09 DIAGNOSIS — E1169 Type 2 diabetes mellitus with other specified complication: Secondary | ICD-10-CM | POA: Diagnosis not present

## 2021-11-09 DIAGNOSIS — N183 Chronic kidney disease, stage 3 unspecified: Secondary | ICD-10-CM

## 2021-11-09 DIAGNOSIS — M5432 Sciatica, left side: Secondary | ICD-10-CM | POA: Diagnosis not present

## 2021-11-09 DIAGNOSIS — M85852 Other specified disorders of bone density and structure, left thigh: Secondary | ICD-10-CM | POA: Diagnosis not present

## 2021-11-09 DIAGNOSIS — G8929 Other chronic pain: Secondary | ICD-10-CM

## 2021-11-09 DIAGNOSIS — E6609 Other obesity due to excess calories: Secondary | ICD-10-CM

## 2021-11-09 DIAGNOSIS — Z0000 Encounter for general adult medical examination without abnormal findings: Secondary | ICD-10-CM | POA: Diagnosis not present

## 2021-11-09 DIAGNOSIS — E785 Hyperlipidemia, unspecified: Secondary | ICD-10-CM

## 2021-11-09 DIAGNOSIS — M5442 Lumbago with sciatica, left side: Secondary | ICD-10-CM

## 2021-11-09 LAB — MICROALBUMIN, URINE WAIVED
Creatinine, Urine Waived: 100 mg/dL (ref 10–300)
Microalb, Ur Waived: 10 mg/L (ref 0–19)
Microalb/Creat Ratio: <30 mg/g (ref <30)

## 2021-11-09 LAB — BAYER DCA HB A1C WAIVED: HB A1C (BAYER DCA - WAIVED): 5.7 % — ABNORMAL HIGH (ref 4.8–5.6)

## 2021-11-09 MED ORDER — OZEMPIC (0.25 OR 0.5 MG/DOSE) 2 MG/1.5ML ~~LOC~~ SOPN: 0.5000 mg | PEN_INJECTOR | SUBCUTANEOUS | 4 refills | Status: DC

## 2021-11-09 MED ORDER — OMEPRAZOLE 20 MG PO CPDR: 20.0000 mg | DELAYED_RELEASE_CAPSULE | Freq: Every day | ORAL | 4 refills | Status: DC

## 2021-11-09 MED ORDER — TELMISARTAN 40 MG PO TABS
40.0000 mg | ORAL_TABLET | Freq: Every day | ORAL | 4 refills | Status: DC
Start: 2021-11-09 — End: 2022-05-19

## 2021-11-09 NOTE — Progress Notes (Signed)
BP 115/73   Pulse 91   Temp 98.2 F (36.8 C)   Ht _0  (1.651 m)   Wt 194 lb 9.6 oz (88.3 kg)   LMP  (LMP Unknown)   SpO2 94%   BMI 32.38 kg/m    Subjective:    Patient ID: Kim Thomas, female    DOB: 1956-06-08, 65 y.o.   MRN: [id number]  HPI: Kim Thomas is a 65 y.o. female presenting on 11/09/2021 for comprehensive medical examination. Current medical complaints include:none  She currently lives with: self Menopausal Symptoms: no  DIABETES Continues on Jardiance 25 MG daily and Ozempic 0.5 MG weekly.   September 6.1%.  Has lost 14 pounds since last year.  She has had days of not taking Jardiance as sugars were low and reports doing well.   Continues on Gabapentin for her underlying back pain -- taking twice a day with benefit.   Hypoglycemic episodes: no Polydipsia/polyuria: no Visual disturbance: no Chest pain: no Paresthesias: no Glucose Monitoring: yes             Accucheck frequency: occasionally             Fasting glucose: 80 to 110             Post prandial:             Evening:              Before meals:  Taking Insulin?: no             Long acting insulin:             Short acting insulin: Blood Pressure Monitoring: not checking Retinal Examination: Up To Date Foot Exam: Up to Date Pneumovax: refused Influenza: refused Aspirin: yes  GERD Continues on Omeprazole 20 MG daily. GERD control status: stable Satisfied with current treatment? yes Heartburn frequency: none Medication side effects: no  Medication compliance: stable Dysphagia: no Odynophagia:  no Hematemesis: no Blood in stool: no EGD: no    HYPERTENSION / HYPERLIPIDEMIA Continues on Atorvastatin 20 MG daily and Telmisartan 40 MG.    Has history of thyroid nodules noted on imaging, last imaging 02/01/21 -- at that time nodules decreased and no further imaging recommended. Satisfied with current treatment? yes Duration of hypertension: chronic BP monitoring frequency: not  checking BP range:  BP medication side effects: no Duration of hyperlipidemia: chronic Cholesterol medication side effects: no Cholesterol supplements: none Medication compliance: good compliance Aspirin: yes Recent stressors: no Recurrent headaches: no Visual changes: no Palpitations: no Dyspnea: no Chest pain: no Lower extremity edema: no Dizzy/lightheaded: no   CHRONIC KIDNEY DISEASE Saw nephrology, Dr. Candiss Norse, last visit on 08/26/21.  eGFR 52, CRT 1.16.  Continues on sodium bicarbonate per recommendation of nephrology.  Has history elevated uric acid.   CKD status: stable Medications renally dose: yes Previous renal evaluation: yes Pneumovax:  Not up to Date Influenza Vaccine:  Not up to Date    OSTEOPENIA Noted on DEXA 01/28/2020 with T-score at left femur -2.2.  Satisfied with current treatment?: yes Past osteoporosis medications/treatments: none Adequate calcium & vitamin D: yes Weight bearing exercises: yes  Depression Screen done today and results listed below:  Depression screen [facility] 2/9 11/09/2021 08/16/2021 08/09/2021 05/06/2021 08/14/2020  Decreased Interest 0 0 0 0 0  Down, Depressed, Hopeless 0 0 0 0 0  PHQ - 2 Score 0 0 0 0 0  Altered sleeping 0 - 0 0 -  Tired, decreased energy 0 -  0 0 -  Change in appetite 0 - 0 0 -  Feeling bad or failure about yourself  0 - 0 0 -  Trouble concentrating 0 - 0 0 -  Moving slowly or fidgety/restless 0 - 0 0 -  Suicidal thoughts 0 - 0 0 -  PHQ-9 Score 0 - 0 0 -  Difficult doing work/chores Not difficult at all - Not difficult at all Not difficult at all -  Some recent data might be hidden    Fall Risk 08/14/2020 05/06/2021 08/09/2021 08/16/2021 11/09/2021  Falls in the past year? 0 0 0 0 0  Number of falls in past year - - - - -  Was there an injury with Fall? - 0 0 - 0  Fall Risk Category Calculator - 0 0 - 0  Fall Risk Category - Low Low - Low  Patient Fall Risk Level Low fall risk - - Low fall risk Low fall risk  Patient at  Risk for Falls Due to Medication side effect No Fall Risks No Fall Risks Medication side effect No Fall Risks  Fall risk Follow up Falls evaluation completed;Education provided;Falls prevention discussed Falls evaluation completed Falls evaluation completed Falls evaluation completed;Education provided;Falls prevention discussed Falls evaluation completed    Functional Status Survey: Is the patient deaf or have difficulty hearing?: No Does the patient have difficulty seeing, even when wearing glasses/contacts?: No Does the patient have difficulty concentrating, remembering, or making decisions?: No Does the patient have difficulty walking or climbing stairs?: No Does the patient have difficulty dressing or bathing?: No Does the patient have difficulty doing errands alone such as visiting a doctor's office or shopping?: No   Past Medical History:  Past Medical History:  Diagnosis Date   Anxiety    Chronic kidney disease    DDD (degenerative disc disease), cervical    Diabetes mellitus without complication ([place])    Gout    Hypertension    Sleep apnea     Surgical History:  Past Surgical History:  Procedure Laterality Date   BREAST BIOPSY Left    benign   BREAST CYST EXCISION Left    c section     x2   CHOLECYSTECTOMY     EYE SURGERY Bilateral 2019   catracts Dr.Hecter in [place]    FOOT SURGERY Bilateral    KNEE ARTHROSCOPY Left 07/18/2016   Procedure: ARTHROSCOPY KNEE;  Surgeon: Dereck Leep, MD;  Location: [facility] ORS;  Service: Orthopedics;  Laterality: Left;   KNEE ARTHROSCOPY WITH LATERAL MENISECTOMY Left 07/18/2016   Procedure: KNEE ARTHROSCOPY WITH PARTIAL  LATERAL MENISECTOMY, CHONDROPLASTY LATERAL FEMORAL CONDYLE;  Surgeon: Dereck Leep, MD;  Location: [facility] ORS;  Service: Orthopedics;  Laterality: Left;   SPINE SURGERY  11/09/11   C5   TUBAL LIGATION      Medications:  Current Outpatient Medications on File Prior to Visit  Medication Sig   acetaminophen  (TYLENOL) 500 MG tablet Take 500 mg by mouth daily as needed.   ascorbic acid (VITAMIN C) 1000 MG tablet Take by mouth.   Aspirin (VAZALORE) 81 MG CAPS Take by mouth.   atorvastatin (LIPITOR) 20 MG tablet TAKE 1 TABLET BY MOUTH  DAILY   baclofen (LIORESAL) 10 MG tablet Take 1 tablet (10 mg total) by mouth at bedtime as needed for muscle spasms.   Blood Glucose Monitoring Suppl (ONE TOUCH ULTRA 2) w/Device KIT Use to check blood sugar 3-4 times daily and document for visits. ICD E11.21   CALCIUM  CITRATE PO Take 1,000 mg by mouth daily.   Cholecalciferol (VITAMIN D3) 2000 units TABS Take 2,000 Units by mouth daily.   Co-Enzyme Q-10 100 MG CAPS Take 50 mg by mouth daily.   diclofenac sodium (VOLTAREN) 1 % GEL Apply topically.   EQ ALLERGY RELIEF, CETIRIZINE, 10 MG tablet Take 1 tablet by mouth once daily   fluticasone (FLONASE) 50 MCG/ACT nasal spray USE 1 TO 2 SPRAY(S) IN EACH NOSTRIL TWICE DAILY   gabapentin (NEURONTIN) 400 MG capsule TAKE 1 CAPSULE BY MOUTH 3  TIMES DAILY   Lancets (ONETOUCH DELICA PLUS [id number]) MISC 1 each by Does not apply route 2 (two) times daily.   Magnesium 400 MG TABS Take by mouth.   naproxen (NAPROSYN) 500 MG tablet Take 1 tablet (500 mg total) by mouth 2 (two) times daily with a meal.   Omega-3 1000 MG CAPS Take 1 capsule by mouth daily.   ONETOUCH ULTRA test strip USE TO CHECK BLOOD SUGAR  DAILY   OVER THE COUNTER MEDICATION Take 2 tablets by mouth daily. Alive Diabetic Multivitamin   Potassium 99 MG TABS Take by mouth.   sodium bicarbonate 650 MG tablet Take by mouth.   triamcinolone cream (KENALOG) 0.1 % Apply 1 application topically 2 (two) times daily.   vitamin E 100 UNIT capsule Take 100 Units by mouth daily.   No current facility-administered medications on file prior to visit.    Allergies:  Allergies  Allergen Reactions   Morphine And Related Itching    It burned, and made patient itch and go out of her mind.   Erythromycin     "Been so long  ago," patient unable to remember reaction   Hydrocodone-Acetaminophen Itching    Social History:  Social History   Socioeconomic History   Marital status: Single    Spouse name: Not on file   Number of children: Not on file   Years of education: Not on file   Highest education level: Bachelor's degree (e.g., BA, AB, BS)  Occupational History   Occupation: retired  Tobacco Use   Smoking status: Former    Packs/day: 0.25    Types: Cigarettes    Quit date: 05/21/2011    Years since quitting: 10.4   Smokeless tobacco: Never  Vaping Use   Vaping Use: Never used  Substance and Sexual Activity   Alcohol use: No    Alcohol/week: 0.0 standard drinks   Drug use: No   Sexual activity: Not Currently  Other Topics Concern   Not on file  Social History Narrative   Not on file   Social Determinants of Health   Financial Resource Strain: Low Risk    Difficulty of Paying Living Expenses: Not hard at all  Food Insecurity: No Food Insecurity   Worried About Charity fundraiser in the Last Year: Never true   [place] in the Last Year: Never true  Transportation Needs: No Transportation Needs   Lack of Transportation (Medical): No   Lack of Transportation (Non-Medical): No  Physical Activity: Insufficiently Active   Days of Exercise per Week: 3 days   Minutes of Exercise per Session: 40 min  Stress: No Stress Concern Present   Feeling of Stress : Not at all  Social Connections: Moderately Isolated   Frequency of Communication with Friends and Family: More than three times a week   Frequency of Social Gatherings with Friends and Family: More than three times a week   Attends Religious Services:  More than 4 times per year   Active Member of Clubs or Organizations: No   Attends Archivist Meetings: Never   Marital Status: Never married  Human resources officer Violence: Not At Risk   Fear of Current or Ex-Partner: No   Emotionally Abused: No   Physically Abused: No    Sexually Abused: No   Social History   Tobacco Use  Smoking Status Former   Packs/day: 0.25   Types: Cigarettes   Quit date: 05/21/2011   Years since quitting: 10.4  Smokeless Tobacco Never   Social History   Substance and Sexual Activity  Alcohol Use No   Alcohol/week: 0.0 standard drinks    Family History:  Family History  Problem Relation Age of Onset   Cancer Mother        breast   Hypertension Mother    Hyperlipidemia Mother    Diabetes Mother    Glaucoma Mother    Cataracts Mother    Breast cancer Mother 58   Cancer Father    Cancer Maternal Grandfather    Breast cancer Maternal Aunt    Breast cancer Cousin    Alcohol abuse Neg Hx     Past medical history, surgical history, medications, allergies, family history and social history reviewed with patient today and changes made to appropriate areas of the chart.   ROS All other ROS negative except what is listed above and in the HPI.      Objective:    BP 115/73   Pulse 91   Temp 98.2 F (36.8 C)   Ht _0  (1.651 m)   Wt 194 lb 9.6 oz (88.3 kg)   LMP  (LMP Unknown)   SpO2 94%   BMI 32.38 kg/m   Wt Readings from Last 3 Encounters:  11/09/21 194 lb 9.6 oz (88.3 kg)  08/16/21 201 lb (91.2 kg)  08/09/21 201 lb (91.2 kg)    Physical Exam Vitals and nursing note reviewed. Exam conducted with a chaperone present.  Constitutional:      General: She is awake. She is not in acute distress.    Appearance: She is well-developed and well-groomed. She is obese. She is not ill-appearing or toxic-appearing.  HENT:     Head: Normocephalic and atraumatic.     Right Ear: Hearing, tympanic membrane, ear canal and external ear normal. No drainage.     Left Ear: Hearing, tympanic membrane, ear canal and external ear normal. No drainage.     Nose: Nose normal.     Right Sinus: No maxillary sinus tenderness or frontal sinus tenderness.     Left Sinus: No maxillary sinus tenderness or frontal sinus tenderness.      Mouth/Throat:     Mouth: Mucous membranes are moist.     Pharynx: Oropharynx is clear. Uvula midline. No pharyngeal swelling, oropharyngeal exudate or posterior oropharyngeal erythema.  Eyes:     General: Lids are normal.        Right eye: No discharge.        Left eye: No discharge.     Extraocular Movements: Extraocular movements intact.     Conjunctiva/sclera: Conjunctivae normal.     Pupils: Pupils are equal, round, and reactive to light.     Visual Fields: Right eye visual fields normal and left eye visual fields normal.  Neck:     Thyroid: No thyromegaly.     Vascular: No carotid bruit.     Trachea: Trachea normal.  Cardiovascular:  Rate and Rhythm: Normal rate and regular rhythm.     Heart sounds: Normal heart sounds. No murmur heard.   No gallop.  Pulmonary:     Effort: Pulmonary effort is normal. No accessory muscle usage or respiratory distress.     Breath sounds: Normal breath sounds.  Abdominal:     General: Bowel sounds are normal.     Palpations: Abdomen is soft. There is no hepatomegaly or splenomegaly.     Tenderness: There is no abdominal tenderness.  Musculoskeletal:        General: Normal range of motion.     Cervical back: Normal range of motion and neck supple.     Right lower leg: No edema.     Left lower leg: No edema.  Lymphadenopathy:     Head:     Right side of head: No submental, submandibular, tonsillar, preauricular or posterior auricular adenopathy.     Left side of head: No submental, submandibular, tonsillar, preauricular or posterior auricular adenopathy.     Cervical: No cervical adenopathy.  Skin:    General: Skin is warm and dry.     Capillary Refill: Capillary refill takes less than 2 seconds.     Findings: No rash.  Neurological:     Mental Status: She is alert and oriented to person, place, and time.     Gait: Gait is intact.     Deep Tendon Reflexes: Reflexes are normal and symmetric.     Reflex Scores:      Brachioradialis  reflexes are 2+ on the right side and 2+ on the left side.      Patellar reflexes are 2+ on the right side and 2+ on the left side. Psychiatric:        Attention and Perception: Attention normal.        Mood and Affect: Mood normal.        Speech: Speech normal.        Behavior: Behavior normal. Behavior is cooperative.        Thought Content: Thought content normal.        Judgment: Judgment normal.   Results for orders placed or performed in visit on 08/09/21  Bayer DCA Hb A1c Waived  Result Value Ref Range   HB A1C (BAYER DCA - WAIVED) 6.1 (H) 4.8 - 5.6 %  Comprehensive metabolic panel  Result Value Ref Range   Glucose 118 (H) 65 - 99 mg/dL   BUN 13 8 - 27 mg/dL   Creatinine, Ser 1.16 (H) 0.57 - 1.00 mg/dL   eGFR 52 (L) >59 mL/min/1.73   BUN/Creatinine Ratio 11 (L) 12 - 28   Sodium 142 134 - 144 mmol/L   Potassium 4.4 3.5 - 5.2 mmol/L   Chloride 106 96 - 106 mmol/L   CO2 21 20 - 29 mmol/L   Calcium 9.4 8.7 - 10.3 mg/dL   Total Protein 7.3 6.0 - 8.5 g/dL   Albumin 4.3 3.8 - 4.8 g/dL   Globulin, Total 3.0 1.5 - 4.5 g/dL   Albumin/Globulin Ratio 1.4 1.2 - 2.2   Bilirubin Total 0.3 0.0 - 1.2 mg/dL   Alkaline Phosphatase 77 44 - 121 IU/L   AST 16 0 - 40 IU/L   ALT 12 0 - 32 IU/L  CBC with Differential/Platelet  Result Value Ref Range   WBC 5.0 3.4 - 10.8 x10E3/uL   RBC 4.50 3.77 - 5.28 x10E6/uL   Hemoglobin 13.3 11.1 - 15.9 g/dL   Hematocrit 41.3 34.0 -  46.6 %   MCV 92 79 - 97 fL   MCH 29.6 26.6 - 33.0 pg   MCHC 32.2 31.5 - 35.7 g/dL   RDW 14.5 11.7 - 15.4 %   Platelets 227 150 - 450 x10E3/uL   Neutrophils 54 Not Estab. %   Lymphs 30 Not Estab. %   Monocytes 13 Not Estab. %   Eos 2 Not Estab. %   Basos 1 Not Estab. %   Neutrophils Absolute 2.7 1.4 - 7.0 x10E3/uL   Lymphocytes Absolute 1.5 0.7 - 3.1 x10E3/uL   Monocytes Absolute 0.7 0.1 - 0.9 x10E3/uL   EOS (ABSOLUTE) 0.1 0.0 - 0.4 x10E3/uL   Basophils Absolute 0.0 0.0 - 0.2 x10E3/uL   Immature Granulocytes 0 Not  Estab. %   Immature Grans (Abs) 0.0 0.0 - 0.1 x10E3/uL  Lipid Panel w/o Chol/HDL Ratio  Result Value Ref Range   Cholesterol, Total 115 100 - 199 mg/dL   Triglycerides 110 0 - 149 mg/dL   HDL 50 >39 mg/dL   VLDL Cholesterol Cal 20 5 - 40 mg/dL   LDL Chol Calc (NIH) 45 0 - 99 mg/dL  TSH  Result Value Ref Range   TSH 0.839 0.450 - 4.500 uIU/mL  VITAMIN D 25 Hydroxy (Vit-D Deficiency, Fractures)  Result Value Ref Range   Vit D, 25-Hydroxy 85.0 30.0 - 100.0 ng/mL      Assessment & Plan:   Problem List Items Addressed This Visit       Cardiovascular and Mediastinum   Hypertension associated with diabetes (HCC)    Chronic, ongoing.  BP at goal today.  Recommend she check BP at home three mornings a week and document for provider.  Continue current medication regimen and adjust as needed.  CMP, CBC, TSH today.  Educated her on Telmisartan and kidney benefit.  Urine micro alb 10 and A:C <30 today.  Focus on DASH diet at home.  Return in 3 months.      Relevant Medications   Semaglutide,0.25 or 0.5MG/DOS, (OZEMPIC, 0.25 OR 0.5 MG/DOSE,) 2 MG/1.5ML SOPN   telmisartan (MICARDIS) 40 MG tablet   Other Relevant Orders   Bayer DCA Hb A1c Waived   Comprehensive metabolic panel     Respiratory   Sleep apnea    Chronic, continue regular (100%) use of CPAP.        Endocrine   CKD stage 3 due to type 2 diabetes mellitus (HCC)    Chronic, ongoing.  Continue Telmisartan for kidney protection.  Check CMP, PTH, and CBC today.  Continue collaboration with nephrology, appreciate their input.      Relevant Medications   Semaglutide,0.25 or 0.5MG/DOS, (OZEMPIC, 0.25 OR 0.5 MG/DOSE,) 2 MG/1.5ML SOPN   telmisartan (MICARDIS) 40 MG tablet   Other Relevant Orders   Bayer DCA Hb A1c Waived   Comprehensive metabolic panel   Microalbumin, Urine Waived   PTH, intact and calcium   Hyperlipidemia associated with type 2 diabetes mellitus (HCC)    Chronic, ongoing.  Continue current medication regimen  and adjust as needed.  Lipid panel today.      Relevant Medications   Semaglutide,0.25 or 0.5MG/DOS, (OZEMPIC, 0.25 OR 0.5 MG/DOSE,) 2 MG/1.5ML SOPN   telmisartan (MICARDIS) 40 MG tablet   Other Relevant Orders   Bayer DCA Hb A1c Waived   Comprehensive metabolic panel   Lipid Panel w/o Chol/HDL Ratio   Multinodular thyroid    Recent imaging noted no need for further imaging.  Will continue monitoring TSH and Free  T4 annually, check today.      Relevant Orders   T4, free   TSH   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  A1C today 5.7%, praised for success at maintaining well below goal.  Urine ALB 10 on check today, continue Telmisartan for kidney protection.  Continue Ozempic 0.5 MG weekly.  Will discontinue Jardiance at this time and restart as needed, if elevations in BS present then increase Ozempic to 1 MG -- she is aware to alert provider if BS elevations at home. Continue increased diet focus and monitoring BS twice a day.  Recommend focus on healthy weight loss and exercise daily.  Return in 3 months.      Relevant Medications   Semaglutide,0.25 or 0.5MG/DOS, (OZEMPIC, 0.25 OR 0.5 MG/DOSE,) 2 MG/1.5ML SOPN   telmisartan (MICARDIS) 40 MG tablet   Other Relevant Orders   Bayer DCA Hb A1c Waived     Musculoskeletal and Integument   Osteopenia of neck of left femur    Noted on recent DEXA -- continue supplements daily and check Vit D level today + PTH.  Plan for repeat DEXA in 2026.  Discussed fall prevention.          Other   Chronic left-sided low back pain    Stable at this time. Continue Gabapentin daily, refills as needed.  Continue to collaborate with chiropractor.      Elevated uric acid in blood    History of elevations, recheck today.  No current medications.      Relevant Orders   CBC with Differential/Platelet   Comprehensive metabolic panel   Uric acid   Obesity    BMI 32.38 with T2DM, HTN/HLD.  Recommended  eating smaller high protein, low fat meals more frequently and exercising 30 mins a day 5 times a week with a goal of 10-15lb weight loss in the next 3 months. Patient voiced their understanding and motivation to adhere to these recommendations.       Relevant Medications   Semaglutide,0.25 or 0.5MG/DOS, (OZEMPIC, 0.25 OR 0.5 MG/DOSE,) 2 MG/1.5ML SOPN   Vitamin D deficiency    Ongoing with underlying osteopenia, continue daily supplement and check Vit D level today.      Relevant Orders   VITAMIN D 25 Hydroxy (Vit-D Deficiency, Fractures)   Other Visit Diagnoses     Encounter for annual physical exam       Annual physical today with labs and health maintenance reviewed.  Refuses flu vaccine.        Follow up plan: Return in about 3 months (around 02/07/2022) for T2DM, HTN/HLD, OSTEOPENIA.   LABORATORY TESTING:  - Pap smear: not applicable  IMMUNIZATIONS:   - Tdap: Tetanus vaccination status reviewed: last tetanus booster within 10 years. - Influenza: Refused - Pneumovax: Not applicable - Prevnar: Up to date - COVID: Up to date - HPV: Not applicable - Shingrix vaccine: Up to date  SCREENING: -Mammogram: Up to date  - Colonoscopy: Up to date  - Bone Density: Up to date  -Hearing Test: Not applicable  -Spirometry: Not applicable   PATIENT COUNSELING:   Advised to take 1 mg of folate supplement per day if capable of pregnancy.   Sexuality: Discussed sexually transmitted diseases, partner selection, use of condoms, avoidance of unintended pregnancy  and contraceptive alternatives.   Advised to avoid cigarette smoking.  I discussed with the patient that most people either abstain from alcohol or drink within safe  limits (<=14/week and <=4 drinks/occasion for males, <=7/weeks and <= 3 drinks/occasion for females) and that the risk for alcohol disorders and other health effects rises proportionally with the number of drinks per week and how often a drinker exceeds daily  limits.  Discussed cessation/primary prevention of drug use and availability of treatment for abuse.   Diet: Encouraged to adjust caloric intake to maintain  or achieve ideal body weight, to reduce intake of dietary saturated fat and total fat, to limit sodium intake by avoiding high sodium foods and not adding table salt, and to maintain adequate dietary potassium and calcium preferably from fresh fruits, vegetables, and low-fat dairy products.    Stressed the importance of regular exercise  Injury prevention: Discussed safety belts, safety helmets, smoke detector, smoking near bedding or upholstery.   Dental health: Discussed importance of regular tooth brushing, flossing, and dental visits.    NEXT PREVENTATIVE PHYSICAL DUE IN 1 YEAR. Return in about 3 months (around 02/07/2022) for T2DM, HTN/HLD, OSTEOPENIA.

## 2021-11-09 NOTE — Assessment & Plan Note (Signed)
History of elevations, recheck today.  No current medications. 

## 2021-11-09 NOTE — Patient Instructions (Signed)

## 2021-11-09 NOTE — Assessment & Plan Note (Signed)
Chronic, ongoing.  Continue Telmisartan for kidney protection.  Check CMP, PTH, and CBC today.  Continue collaboration with nephrology, appreciate their input.

## 2021-11-09 NOTE — Assessment & Plan Note (Signed)
Ongoing with underlying osteopenia, continue daily supplement and check Vit D level today. 

## 2021-11-09 NOTE — Assessment & Plan Note (Addendum)
Stable at this time. Continue Gabapentin daily, refills as needed.  Continue to collaborate with chiropractor.

## 2021-11-09 NOTE — Assessment & Plan Note (Signed)
Chronic, ongoing.  A1C today 5.7%, praised for success at maintaining well below goal.  Urine ALB 10 on check today, continue Telmisartan for kidney protection.  Continue Ozempic 0.5 MG weekly.  Will discontinue Jardiance at this time and restart as needed, if elevations in BS present then increase Ozempic to 1 MG -- she is aware to alert provider if BS elevations at home. Continue increased diet focus and monitoring BS twice a day.  Recommend focus on healthy weight loss and exercise daily.  Return in 3 months.

## 2021-11-09 NOTE — Assessment & Plan Note (Signed)
Recent imaging noted no need for further imaging.  Will continue monitoring TSH and Free T4 annually, check today.

## 2021-11-09 NOTE — Assessment & Plan Note (Signed)
Chronic, ongoing.  Continue current medication regimen and adjust as needed. Lipid panel today. 

## 2021-11-09 NOTE — Assessment & Plan Note (Addendum)
BMI 32.38 with T2DM, HTN/HLD.  Recommended eating smaller high protein, low fat meals more frequently and exercising 30 mins a day 5 times a week with a goal of 10-15lb weight loss in the next 3 months. Patient voiced their understanding and motivation to adhere to these recommendations.

## 2021-11-09 NOTE — Assessment & Plan Note (Signed)
Chronic, continue regular (100%) use of CPAP.

## 2021-11-09 NOTE — Assessment & Plan Note (Signed)
Noted on recent DEXA -- continue supplements daily and check Vit D level today + PTH.  Plan for repeat DEXA in 2026.  Discussed fall prevention.

## 2021-11-09 NOTE — Assessment & Plan Note (Signed)
Chronic, ongoing.  BP at goal today.  Recommend she check BP at home three mornings a week and document for provider.  Continue current medication regimen and adjust as needed.  CMP, CBC, TSH today.  Educated her on Telmisartan and kidney benefit.  Urine micro alb 10 and A:C <30 today.  Focus on DASH diet at home.  Return in 3 months.

## 2021-11-10 LAB — CBC WITH DIFFERENTIAL/PLATELET
Basophils Absolute: 0.1 10*3/uL (ref 0.0–0.2)
Basos: 1 %
EOS (ABSOLUTE): 0.1 10*3/uL (ref 0.0–0.4)
Eos: 2 %
Hematocrit: 41.3 % (ref 34.0–46.6)
Hemoglobin: 13.8 g/dL (ref 11.1–15.9)
Immature Grans (Abs): 0 10*3/uL (ref 0.0–0.1)
Immature Granulocytes: 0 %
Lymphocytes Absolute: 2 10*3/uL (ref 0.7–3.1)
Lymphs: 33 %
MCH: 30.1 pg (ref 26.6–33.0)
MCHC: 33.4 g/dL (ref 31.5–35.7)
MCV: 90 fL (ref 79–97)
Monocytes Absolute: 0.6 10*3/uL (ref 0.1–0.9)
Monocytes: 9 %
Neutrophils Absolute: 3.4 10*3/uL (ref 1.4–7.0)
Neutrophils: 55 %
Platelets: 254 10*3/uL (ref 150–450)
RBC: 4.58 x10E6/uL (ref 3.77–5.28)
RDW: 13.3 % (ref 11.7–15.4)
WBC: 6.2 10*3/uL (ref 3.4–10.8)

## 2021-11-10 LAB — LIPID PANEL W/O CHOL/HDL RATIO
Cholesterol, Total: 136 mg/dL (ref 100–199)
HDL: 51 mg/dL (ref >39)
LDL Chol Calc (NIH): 61 mg/dL (ref 0–99)
Triglycerides: 135 mg/dL (ref 0–149)
VLDL Cholesterol Cal: 24 mg/dL (ref 5–40)

## 2021-11-10 LAB — COMPREHENSIVE METABOLIC PANEL
ALT: 12 IU/L (ref 0–32)
AST: 16 IU/L (ref 0–40)
Albumin/Globulin Ratio: 1.2 (ref 1.2–2.2)
Albumin: 4 g/dL (ref 3.8–4.8)
Alkaline Phosphatase: 93 IU/L (ref 44–121)
BUN/Creatinine Ratio: 11 — ABNORMAL LOW (ref 12–28)
BUN: 13 mg/dL (ref 8–27)
Bilirubin Total: 0.3 mg/dL (ref 0.0–1.2)
CO2: 21 mmol/L (ref 20–29)
Calcium: 9 mg/dL (ref 8.7–10.3)
Chloride: 103 mmol/L (ref 96–106)
Creatinine, Ser: 1.17 mg/dL — ABNORMAL HIGH (ref 0.57–1.00)
Globulin, Total: 3.3 g/dL (ref 1.5–4.5)
Glucose: 133 mg/dL — ABNORMAL HIGH (ref 70–99)
Potassium: 4.1 mmol/L (ref 3.5–5.2)
Sodium: 140 mmol/L (ref 134–144)
Total Protein: 7.3 g/dL (ref 6.0–8.5)
eGFR: 52 mL/min/{1.73_m2} — ABNORMAL LOW (ref >59)

## 2021-11-10 LAB — T4, FREE: Free T4: 1.71 ng/dL (ref 0.82–1.77)

## 2021-11-10 LAB — PTH, INTACT AND CALCIUM: PTH: 21 pg/mL (ref 15–65)

## 2021-11-10 LAB — TSH: TSH: 1.48 u[IU]/mL (ref 0.450–4.500)

## 2021-11-10 LAB — URIC ACID: Uric Acid: 5.7 mg/dL (ref 3.0–7.2)

## 2021-11-10 LAB — VITAMIN D 25 HYDROXY (VIT D DEFICIENCY, FRACTURES): Vit D, 25-Hydroxy: 63.6 ng/mL (ref 30.0–100.0)

## 2021-11-10 NOTE — Progress Notes (Signed)
Contacted via MyChart   Good afternoon Kim Thomas, it was so good to see you yesterday.  Everything is remaining nice and stable -- kidney function remains in stable range with no worsening kidney disease.  Overall great news!! Keep being amazing!!  Thank you for allowing me to participate in your care.  I appreciate you. Kindest regards, Kyon Bentler

## 2021-11-12 ENCOUNTER — Encounter: Payer: Self-pay | Admitting: Nurse Practitioner

## 2021-11-30 DIAGNOSIS — M5416 Radiculopathy, lumbar region: Secondary | ICD-10-CM | POA: Diagnosis not present

## 2021-11-30 DIAGNOSIS — M5432 Sciatica, left side: Secondary | ICD-10-CM | POA: Diagnosis not present

## 2021-11-30 DIAGNOSIS — M9905 Segmental and somatic dysfunction of pelvic region: Secondary | ICD-10-CM | POA: Diagnosis not present

## 2021-11-30 DIAGNOSIS — M9903 Segmental and somatic dysfunction of lumbar region: Secondary | ICD-10-CM | POA: Diagnosis not present

## 2021-12-02 DIAGNOSIS — H04123 Dry eye syndrome of bilateral lacrimal glands: Secondary | ICD-10-CM | POA: Diagnosis not present

## 2021-12-02 DIAGNOSIS — H40013 Open angle with borderline findings, low risk, bilateral: Secondary | ICD-10-CM | POA: Diagnosis not present

## 2021-12-13 ENCOUNTER — Other Ambulatory Visit: Payer: Self-pay | Admitting: Nurse Practitioner

## 2021-12-13 ENCOUNTER — Encounter: Payer: Self-pay | Admitting: Nurse Practitioner

## 2021-12-13 ENCOUNTER — Other Ambulatory Visit: Payer: Self-pay

## 2021-12-13 ENCOUNTER — Ambulatory Visit (INDEPENDENT_AMBULATORY_CARE_PROVIDER_SITE_OTHER): Payer: Medicare Other | Admitting: Nurse Practitioner

## 2021-12-13 VITALS — BP 129/78 | HR 94 | Temp 98.2°F | Ht 65.0 in | Wt 192.8 lb

## 2021-12-13 DIAGNOSIS — M1990 Unspecified osteoarthritis, unspecified site: Secondary | ICD-10-CM | POA: Diagnosis not present

## 2021-12-13 DIAGNOSIS — M79642 Pain in left hand: Secondary | ICD-10-CM | POA: Insufficient documentation

## 2021-12-13 DIAGNOSIS — R768 Other specified abnormal immunological findings in serum: Secondary | ICD-10-CM | POA: Insufficient documentation

## 2021-12-13 DIAGNOSIS — M79641 Pain in right hand: Secondary | ICD-10-CM | POA: Insufficient documentation

## 2021-12-13 NOTE — Assessment & Plan Note (Signed)
Noted on labs with rheumatology in 2016, repeat today.  Referral to rheumatology placed.

## 2021-12-13 NOTE — Patient Instructions (Signed)
Arthritis Arthritis means joint pain. It can also mean joint disease. A joint is a place where bones come together. There are more than 100 types of arthritis. What are the causes? This condition may be caused by: Wear and tear of a joint. This is the most common cause. A lot of acid in the blood, which leads to pain in the joint (gout). Pain and swelling (inflammation) in a joint. Infection of a joint. Injuries in the joint. A reaction to medicines (allergy). In some cases, the cause may not be known. What are the signs or symptoms? Symptoms of this condition include: Redness at a joint. Swelling at a joint. Stiffness at a joint. Warmth coming from the joint. A fever. A feeling of being sick. How is this treated? This condition may be treated with: Treating the cause, if it is known. Rest. Raising (elevating) the joint. Putting cold or hot packs on the joint. Medicines to treat symptoms and reduce pain and swelling. Shots of medicines (cortisone) into the joint. You may also be told to make changes in your life, such as doing exercises and losing weight. Follow these instructions at home: Medicines Take over-the-counter and prescription medicines only as told by your doctor. Do not take aspirin for pain if your doctor says that you may have gout. Activity Rest your joint if your doctor tells you to. Avoid activities that make the pain worse. Exercise your joint regularly as told by your doctor. Try doing exercises like: Swimming. Water aerobics. Biking. Walking. Managing pain, stiffness, and swelling   If told, put ice on the affected area. Put ice in a plastic bag. Place a towel between your skin and the bag. Leave the ice on for 20 minutes, 2-3 times per day. If your joint is swollen, raise (elevate) it above the level of your heart if told by your doctor. If your joint feels stiff in the morning, try taking a warm shower. If told, put heat on the affected area. Do  this as often as told by your doctor. Use the heat source that your doctor recommends, such as a moist heat pack or a heating pad. If you have diabetes, do not apply heat without asking your doctor. To apply heat: Place a towel between your skin and the heat source. Leave the heat on for 20-30 minutes. Remove the heat if your skin turns bright red. This is very important if you are unable to feel pain, heat, or cold. You may have a greater risk of getting burned. General instructions Do not use any products that contain nicotine or tobacco, such as cigarettes, e-cigarettes, and chewing tobacco. If you need help quitting, ask your doctor. Keep all follow-up visits as told by your doctor. This is important. Contact a doctor if: The pain gets worse. You have a fever. Get help right away if: You have very bad pain in your joint. You have swelling in your joint. Your joint is red. Many joints become painful and swollen. You have very bad back pain. Your leg is very weak. You cannot control your pee (urine) or poop (stool). Summary Arthritis means joint pain. It can also mean joint disease. A joint is a place where bones come together. The most common cause of this condition is wear and tear of a joint. Symptoms of this condition include redness, swelling, or stiffness of the joint. This condition is treated with rest, raising the joint, medicines, and putting cold or hot packs on the joint. Follow your   doctor's instructions about medicines, activity, exercises, and other home care treatments. This information is not intended to replace advice given to you by your health care provider. Make sure you discuss any questions you have with your health care provider. Document Revised: 10/22/2018 Document Reviewed: 10/22/2018 Elsevier Patient Education  2022 [employer]  

## 2021-12-13 NOTE — Assessment & Plan Note (Signed)
Refer to chronic inflammatory arthritis plan of care.

## 2021-12-13 NOTE — Progress Notes (Signed)
BP 129/78 (BP Location: Left Arm, Cuff Size: Normal)    Pulse 94    Temp 98.2 F (36.8 C)    Ht _0  (1.651 m)    Wt 192 lb 12.8 oz (87.5 kg)    LMP  (LMP Unknown)    SpO2 98%    BMI 32.08 kg/m    Subjective:    Patient ID: Kim Thomas Overall, female    DOB: Jun 08, 1956, 66 y.o.   MRN: [id number]  HPI: Kim Thomas is a 66 y.o. female  Chief Complaint  Patient presents with   Referral    Patient states she is here to discuss a referral to discuss Arthritis. Patient states she was diagnosed with Arthritis in her R hand about 5 years ago. Patient states she has seen a Rheumatologist about 5 years ago at what she thinks was [facility]. Patient states she does not want to go to [facility] and wants to discuss different options. Patient states she when she called for appointment. Patient states she was experiencing pain in her hand she's never felt before.    ARTHRALGIAS / JOINT ACHES Has seen [facility] rheumatology in past (2016) and does not want to return there.  Saw them for hand pain and spasms.  Reports her fingers and hands are hurting more lately and spasms + she states they are changing shapes.  The cold makes pain hurt worse and spasms worse.  Having difficulty with even brushing teeth and holding tooth brush and is dropping items.  In 2016 CCP antibodies +. Duration: months Pain: yes Symmetric: yes  10/10 Quality: sharp, aching, and tender Frequency: intermittent Context:  worse Decreased function/range of motion: yes Erythema: yes Swelling: yes Heat or warmth: yes Morning stiffness: occasional Aggravating factors: cold weather Alleviating factors: parafon wax treatment Relief with NSAIDs?: No NSAIDs Taken Treatments attempted:  parafon wax treatment, heat, arthritis gloves, creams (Voltaren gel) Involved Joints:     Hands: yes bilateral  == R>L    Wrists: none    Elbows: none    Shoulders: none    Back: none    Hips: none    Knees: none    Ankles: none     Feet: none  Relevant past medical, surgical, family and social history reviewed and updated as indicated. Interim medical history since our last visit reviewed. Allergies and medications reviewed and updated.  Review of Systems  Constitutional:  Negative for activity change, appetite change, diaphoresis, fatigue and fever.  Respiratory:  Negative for cough, chest tightness and shortness of breath.   Cardiovascular:  Negative for chest pain, palpitations and leg swelling.  Gastrointestinal: Negative.   Musculoskeletal:  Positive for arthralgias.  Neurological: Negative.   Psychiatric/Behavioral: Negative.     Per HPI unless specifically indicated above     Objective:    BP 129/78 (BP Location: Left Arm, Cuff Size: Normal)    Pulse 94    Temp 98.2 F (36.8 C)    Ht _1  (1.651 m)    Wt 192 lb 12.8 oz (87.5 kg)    LMP  (LMP Unknown)    SpO2 98%    BMI 32.08 kg/m   Wt Readings from Last 3 Encounters:  12/13/21 192 lb 12.8 oz (87.5 kg)  11/09/21 194 lb 9.6 oz (88.3 kg)  08/16/21 201 lb (91.2 kg)    Physical Exam Vitals and nursing note reviewed.  Constitutional:      General: She is awake. She is not in acute distress.  Appearance: She is well-developed. She is obese. She is not ill-appearing.  HENT:     Head: Normocephalic.     Right Ear: Hearing normal. No drainage.     Left Ear: Hearing normal. No drainage.  Eyes:     General: Lids are normal.        Right eye: No discharge.        Left eye: No discharge.     Conjunctiva/sclera: Conjunctivae normal.     Pupils: Pupils are equal, round, and reactive to light.  Neck:     Vascular: No carotid bruit.  Cardiovascular:     Rate and Rhythm: Normal rate and regular rhythm.     Heart sounds: Normal heart sounds. No murmur heard.   No gallop.  Pulmonary:     Effort: Pulmonary effort is normal. No accessory muscle usage or respiratory distress.     Breath sounds: Normal breath sounds.  Abdominal:     General: Bowel sounds  are normal.     Palpations: Abdomen is soft.  Musculoskeletal:     Right hand: Swelling and tenderness present. No deformity or bony tenderness. Decreased range of motion. Decreased strength of finger abduction. Normal sensation. Normal pulse.     Left hand: Tenderness present. No swelling, deformity or bony tenderness. Decreased range of motion. Decreased strength of finger abduction. Normal sensation. Normal pulse.     Cervical back: Normal range of motion and neck supple.     Right lower leg: No edema.     Left lower leg: No edema.  Skin:    General: Skin is warm and dry.  Neurological:     Mental Status: She is alert and oriented to person, place, and time.  Psychiatric:        Attention and Perception: Attention normal.        Mood and Affect: Mood normal.        Speech: Speech normal.        Behavior: Behavior normal. Behavior is cooperative.        Thought Content: Thought content normal.    Results for orders placed or performed in visit on 11/09/21  T4, free  Result Value Ref Range   Free T4 1.71 0.82 - 1.77 ng/dL  Bayer DCA Hb A1c Waived  Result Value Ref Range   HB A1C (BAYER DCA - WAIVED) 5.7 (H) 4.8 - 5.6 %  CBC with Differential/Platelet  Result Value Ref Range   WBC 6.2 3.4 - 10.8 x10E3/uL   RBC 4.58 3.77 - 5.28 x10E6/uL   Hemoglobin 13.8 11.1 - 15.9 g/dL   Hematocrit 41.3 34.0 - 46.6 %   MCV 90 79 - 97 fL   MCH 30.1 26.6 - 33.0 pg   MCHC 33.4 31.5 - 35.7 g/dL   RDW 13.3 11.7 - 15.4 %   Platelets 254 150 - 450 x10E3/uL   Neutrophils 55 Not Estab. %   Lymphs 33 Not Estab. %   Monocytes 9 Not Estab. %   Eos 2 Not Estab. %   Basos 1 Not Estab. %   Neutrophils Absolute 3.4 1.4 - 7.0 x10E3/uL   Lymphocytes Absolute 2.0 0.7 - 3.1 x10E3/uL   Monocytes Absolute 0.6 0.1 - 0.9 x10E3/uL   EOS (ABSOLUTE) 0.1 0.0 - 0.4 x10E3/uL   Basophils Absolute 0.1 0.0 - 0.2 x10E3/uL   Immature Granulocytes 0 Not Estab. %   Immature Grans (Abs) 0.0 0.0 - 0.1 x10E3/uL   Comprehensive metabolic panel  Result Value Ref Range  Glucose 133 (H) 70 - 99 mg/dL   BUN 13 8 - 27 mg/dL   Creatinine, Ser 1.17 (H) 0.57 - 1.00 mg/dL   eGFR 52 (L) >59 mL/min/1.73   BUN/Creatinine Ratio 11 (L) 12 - 28   Sodium 140 134 - 144 mmol/L   Potassium 4.1 3.5 - 5.2 mmol/L   Chloride 103 96 - 106 mmol/L   CO2 21 20 - 29 mmol/L   Calcium 9.0 8.7 - 10.3 mg/dL   Total Protein 7.3 6.0 - 8.5 g/dL   Albumin 4.0 3.8 - 4.8 g/dL   Globulin, Total 3.3 1.5 - 4.5 g/dL   Albumin/Globulin Ratio 1.2 1.2 - 2.2   Bilirubin Total 0.3 0.0 - 1.2 mg/dL   Alkaline Phosphatase 93 44 - 121 IU/L   AST 16 0 - 40 IU/L   ALT 12 0 - 32 IU/L  Lipid Panel w/o Chol/HDL Ratio  Result Value Ref Range   Cholesterol, Total 136 100 - 199 mg/dL   Triglycerides 135 0 - 149 mg/dL   HDL 51 >39 mg/dL   VLDL Cholesterol Cal 24 5 - 40 mg/dL   LDL Chol Calc (NIH) 61 0 - 99 mg/dL  TSH  Result Value Ref Range   TSH 1.480 0.450 - 4.500 uIU/mL  VITAMIN D 25 Hydroxy (Vit-D Deficiency, Fractures)  Result Value Ref Range   Vit D, 25-Hydroxy 63.6 30.0 - 100.0 ng/mL  Microalbumin, Urine Waived  Result Value Ref Range   Microalb, Ur Waived 10 0 - 19 mg/L   Creatinine, Urine Waived 100 10 - 300 mg/dL   Microalb/Creat Ratio <30 <30 mg/g  PTH, intact and calcium  Result Value Ref Range   PTH 21 15 - 65 pg/mL   PTH Interp Comment   Uric acid  Result Value Ref Range   Uric Acid 5.7 3.0 - 7.2 mg/dL      Assessment & Plan:   Problem List Items Addressed This Visit       Musculoskeletal and Integument   Chronic inflammatory arthritis - Primary    To both hands ongoing for years, saw rheumatology in past and wishes to return but to alternate provider.  Obtain labs today CRP, ESR, CCP, ANA, uric acid.  Continue current at home treatment which is offering some benefit.  Return as scheduled.  Referral to rheumatology placed.      Relevant Orders   Ambulatory referral to Rheumatology     Other   Cyclic  citrullinated peptide (CCP) antibody positive    Noted on labs with rheumatology in 2016, repeat today.  Referral to rheumatology placed.      Relevant Orders   Ambulatory referral to Rheumatology   Pain in both hands    Refer to chronic inflammatory arthritis plan of care.      Relevant Orders   ANA w/Reflex if Positive   C-reactive protein   Sed Rate (ESR)   Uric acid   RA Qn+CCP(IgG/A)+SjoSSA+SjoSSB   Ambulatory referral to Rheumatology     Follow up plan: Return if symptoms worsen or fail to improve.

## 2021-12-13 NOTE — Telephone Encounter (Signed)
Requested medication (s) are due for refill today:   Yes  Requested medication (s) are on the active medication list:   Yes  Future visit scheduled:   Yes   Seen today   Last ordered: 11/09/2021 4.5 ml, 4 refills  Returned because pharmacy requesting a 1 yr supply  4.5 ml with 3 refills.   Requested Prescriptions  Pending Prescriptions Disp Refills   OZEMPIC, 0.25 OR 0.5 MG/DOSE, 2 MG/1.5ML SOPN [Pharmacy Med Name: Ozempic (0.25 or 0.5 MG/DOSE) 2 MG/1.5ML Subcutaneous Solution Pen-injector] 4.5 mL 3    Sig: INJECT SUBCUTANEOUSLY 0.25  MG WEEKLY FOR 4 WEEKS, THEN INCREASE TO 0.5 MG WEEKLY  THEREAFTER     Endocrinology:  Diabetes - GLP-1 Receptor Agonists Passed - 12/13/2021  4:21 AM      Passed - HBA1C is between 0 and 7.9 and within 180 days    Hemoglobin A1C  Date Value Ref Range Status  03/17/2016 7.2  Final   HB A1C (BAYER DCA - WAIVED)  Date Value Ref Range Status  11/09/2021 5.7 (H) 4.8 - 5.6 % Final    Comment:             Prediabetes: 5.7 - 6.4          Diabetes: >6.4          Glycemic control for adults with diabetes: <7.0           Passed - Valid encounter within last 6 months    Recent Outpatient Visits           Today Chronic inflammatory arthritis   [facility] [place], Jolene T, NP   1 month ago Type 2 diabetes mellitus with diabetic nephropathy, without long-term current use of insulin (HCC)   [facility] [place], Jolene T, NP   4 months ago Type 2 diabetes mellitus with diabetic nephropathy, without long-term current use of insulin (HCC)   [facility] [place], Claxton T, NP   5 months ago Acute left-sided low back pain with left-sided sciatica   [facility], Megan P, DO   7 months ago Type 2 diabetes mellitus with diabetic nephropathy, without long-term current use of insulin (HCC)   [facility] [place], Dorie Rank, NP       Future Appointments             In 1 month Cannady,  Dorie Rank, NP [facility]   In 8 months  [facility]

## 2021-12-13 NOTE — Assessment & Plan Note (Signed)
To both hands ongoing for years, saw rheumatology in past and wishes to return but to alternate provider.  Obtain labs today CRP, ESR, CCP, ANA, uric acid.  Continue current at home treatment which is offering some benefit.  Return as scheduled.  Referral to rheumatology placed.

## 2021-12-14 NOTE — Progress Notes (Signed)
Contacted via MyChart

## 2021-12-15 ENCOUNTER — Encounter: Payer: Self-pay | Admitting: Nurse Practitioner

## 2021-12-15 LAB — RA QN+CCP(IGG/A)+SJOSSA+SJOSSB
Cyclic Citrullin Peptide Ab: 30 units — ABNORMAL HIGH (ref 0–19)
ENA SSA (RO) Ab: <0.2 AI (ref 0.0–0.9)
ENA SSB (LA) Ab: <0.2 AI (ref 0.0–0.9)
Rheumatoid fact SerPl-aCnc: <10 IU/mL (ref <14.0)

## 2021-12-15 LAB — ANA W/REFLEX IF POSITIVE: Anti Nuclear Antibody (ANA): NEGATIVE

## 2021-12-15 LAB — SEDIMENTATION RATE: Sed Rate: 50 mm/hr — ABNORMAL HIGH (ref 0–40)

## 2021-12-15 LAB — URIC ACID: Uric Acid: 6.1 mg/dL (ref 3.0–7.2)

## 2021-12-15 LAB — C-REACTIVE PROTEIN: CRP: 5 mg/L (ref 0–10)

## 2021-12-21 ENCOUNTER — Other Ambulatory Visit: Payer: Self-pay | Admitting: Nurse Practitioner

## 2021-12-21 NOTE — Telephone Encounter (Signed)
Requested Prescriptions  Pending Prescriptions Disp Refills   fluticasone (FLONASE) 50 MCG/ACT nasal spray [Pharmacy Med Name: Fluticasone Propionate 50 MCG/ACT Nasal Suspension] 48 g 0    Sig: USE 1 TO 2 SPRAYS IN BOTH  NOSTRILS TWICE DAILY     Ear, Nose, and Throat: Nasal Preparations - Corticosteroids Passed - 12/21/2021  3:18 PM      Passed - Valid encounter within last 12 months    Recent Outpatient Visits          1 week ago Chronic inflammatory arthritis   [facility] [place], Jolene T, NP   1 month ago Type 2 diabetes mellitus with diabetic nephropathy, without long-term current use of insulin (HCC)   [facility] [place], [place] T, NP   4 months ago Type 2 diabetes mellitus with diabetic nephropathy, without long-term current use of insulin (HCC)   [facility] [place], Krum T, NP   5 months ago Acute left-sided low back pain with left-sided sciatica   [facility] [place], Megan P, DO   7 months ago Type 2 diabetes mellitus with diabetic nephropathy, without long-term current use of insulin (HCC)   [facility] [place], Dorie Rank, NP      Future Appointments            In 1 month Cannady, Dorie Rank, NP [facility]   In 1 month Rice, Jamesetta [place], MD [facility]   In 8 months  [facility]

## 2021-12-28 DIAGNOSIS — M5416 Radiculopathy, lumbar region: Secondary | ICD-10-CM | POA: Diagnosis not present

## 2021-12-28 DIAGNOSIS — M5432 Sciatica, left side: Secondary | ICD-10-CM | POA: Diagnosis not present

## 2021-12-28 DIAGNOSIS — M9903 Segmental and somatic dysfunction of lumbar region: Secondary | ICD-10-CM | POA: Diagnosis not present

## 2021-12-28 DIAGNOSIS — M9905 Segmental and somatic dysfunction of pelvic region: Secondary | ICD-10-CM | POA: Diagnosis not present

## 2021-12-29 ENCOUNTER — Ambulatory Visit (INDEPENDENT_AMBULATORY_CARE_PROVIDER_SITE_OTHER): Payer: Medicare Other

## 2021-12-29 ENCOUNTER — Telehealth: Payer: Medicare Other

## 2021-12-29 DIAGNOSIS — G8929 Other chronic pain: Secondary | ICD-10-CM

## 2021-12-29 DIAGNOSIS — E1159 Type 2 diabetes mellitus with other circulatory complications: Secondary | ICD-10-CM

## 2021-12-29 DIAGNOSIS — M25512 Pain in left shoulder: Secondary | ICD-10-CM

## 2021-12-29 DIAGNOSIS — M79641 Pain in right hand: Secondary | ICD-10-CM

## 2021-12-29 DIAGNOSIS — E1121 Type 2 diabetes mellitus with diabetic nephropathy: Secondary | ICD-10-CM

## 2021-12-29 DIAGNOSIS — M1990 Unspecified osteoarthritis, unspecified site: Secondary | ICD-10-CM

## 2021-12-29 DIAGNOSIS — M79642 Pain in left hand: Secondary | ICD-10-CM

## 2021-12-29 DIAGNOSIS — E1169 Type 2 diabetes mellitus with other specified complication: Secondary | ICD-10-CM

## 2021-12-29 NOTE — Patient Instructions (Signed)
Visit Information  Thank you for taking time to visit with me today. Please don't hesitate to contact me if I can be of assistance to you before our next scheduled telephone appointment.  Following are the goals we discussed today:  RNCM Clinical Goal(s):  Patient will verbalize basic understanding of HTN, HLD, Chronic pain, and DMII disease process and self health management plan as evidenced by keeping appointments, following the plan of care, calling the office for changes or questions, and working with the CCM team to optimize health and well being  take all medications exactly as prescribed and will call provider for medication related questions as evidenced by compliance and calling for refills before running out of medications     attend all scheduled medical appointments: 02-07-2022 and 1 pm as evidenced by keeping appointments and calling the office for schedule change needs         demonstrate a decrease in HTN, HLD, chronic pain, and DMII exacerbations  as evidenced by stable conditions, and working with the pcp and CCM team to optimize health and well being  demonstrate ongoing self health care management ability to effectively manage chronic conditions  as evidenced by working with the CCM team through collaboration with Consulting civil engineer, provider, and care team.    Interventions: 1:1 collaboration with primary care provider regarding development and update of comprehensive plan of care as evidenced by provider attestation and co-signature Inter-disciplinary care team collaboration (see longitudinal plan of care) Evaluation of current treatment plan related to  self management and patient's adherence to plan as established by provider     Diabetes:  (Status: Goal on Track (progressing): YES.) Long Term Goal         Lab Results  Component Value Date    HGBA1C 5.7 (H) 11/09/2021  Assessed patient's understanding of A1c goal: <7% Provided education to patient about basic DM disease  process; Reviewed medications with patient and discussed importance of medication adherence. 12-29-2021: Is compliant with medications. She states that she is monitoring her medications and doing well;        Reviewed prescribed diet with patient heart healthy/ADA. 12-29-2021: The patient is compliant with heart healthy/ADA diet. Is mindful of dietary restrictions ; Counseled on importance of regular laboratory monitoring as prescribed;        Discussed plans with patient for ongoing care management follow up and provided patient with direct contact information for care management team;      Provided patient with written educational materials related to hypo and hyperglycemia and importance of correct treatment. 12-29-2021: The patient denies any lows. The patient states lowest has been 104;       Reviewed scheduled/upcoming provider appointments including: 02-07-2022 at 1 pm         Advised patient, providing education and rationale, to check cbg when you have symptoms of low or high blood sugar, before and after exercise, and as directed   and record. 12-29-2021: The patient is taking blood sugars and writing down the values. The patient is pleased with her blood sugars and changes in DM management.       call provider for findings outside established parameters;       Review of patient status, including review of consultants reports, relevant laboratory and other test results, and medications completed;       Screening for signs and symptoms of depression related to chronic disease state;        Assessed social determinant of health barriers;  Hyperlipidemia:  (Status: Goal on Track (progressing): YES.) Long Term Goal       Lab Results  Component Value Date    CHOL 136 11/09/2021    HDL 51 11/09/2021    LDLCALC 61 11/09/2021    TRIG 135 11/09/2021    CHOLHDL 2.5 07/10/2018      Medication review performed; medication list updated in electronic medical record. 12-29-2021: The patient is  compliant with medications. The patient denies any issues with medications compliance.  Provider established cholesterol goals reviewed. 12-29-2021: The patient is at goal. Praise for being in range. Counseled on importance of regular laboratory monitoring as prescribed. 12-29-2021: The patient has regular lab work Provided HLD educational materials; Reviewed role and benefits of statin for ASCVD risk reduction; Discussed strategies to manage statin-induced myalgias; Reviewed importance of limiting foods high in cholesterol. 12-29-2021: The patient states she is mindful of her dietary restrictions   Hypertension: (Status: Goal on Track (progressing): YES.) Last practice recorded BP readings:     BP Readings from Last 3 Encounters:  12/13/21 129/78  11/09/21 115/73  08/09/21 121/71  Most recent eGFR/CrCl:       Lab Results  Component Value Date    EGFR 52 (L) 11/09/2021    No components found for: CRCL   Evaluation of current treatment plan related to hypertension self management and patient's adherence to plan as established by provider. 12-29-2021: The patient states that she is managing her blood pressures and has no new issues related HTN and heart health   Provided education to patient re: stroke prevention, s/s of heart attack and stroke; Reviewed prescribed diet heart healthy/ADA diet. 12-29-2021: The patient is compliant with heart healthy/ADA diet. Reviewed medications with patient and discussed importance of compliance. 12-29-2021: The patient is compliant with medications.   Discussed plans with patient for ongoing care management follow up and provided patient with direct contact information for care management team; Advised patient, providing education and rationale, to monitor blood pressure daily and record, calling PCP for findings outside established parameters;  Provided education on prescribed diet heart healthy/ADA;  Discussed complications of poorly controlled blood pressure such  as heart disease, stroke, circulatory complications, vision complications, kidney impairment, sexual dysfunction;      Pain:  (Status: Goal on Track (progressing): YES.) Long Term Goal  Pain assessment performed. 10-27-2021: Rates her pain at a 5 today on a scale of 0-10. States the weather changes impact her pain level but she has a high tolerance. 12-29-2021: The patient states she has no pain today. She is going to a specialist at the end of the month.  Medications reviewed. 10-27-2021: Is compliant with medications  Reviewed provider established plan for pain management.  12-29-2021: The patient has been seeing the chiropractor and this is working well for her. He has given her stretching exercises to help with management of her shoulder pain and discomfort. Saw chiropractor yesterday and they worked on the area in her back and leg where she had an incident at [place] recently. She states she wants to know about exercises to strengthen her back. Will send information by the [facility] platform and my chart for the patient. Also is having pain in hands. Saw pcp recently and will see the specialist. She is using paraffin wax treatment BID for relief of hand pain. She is hopeful that the specialist will be able to help her more also. Will continue to monitor for changes.  Discussed importance of adherence to all scheduled medical appointments.  Pcp 02-07-2022 and specialist the end of February; Counseled on the importance of reporting any/all new or changed pain symptoms or management strategies to pain management provider; Advised patient to report to care team affect of pain on daily activities; Discussed use of relaxation techniques and/or diversional activities to assist with pain reduction (distraction, imagery, relaxation, massage, acupressure, TENS, heat, and cold application. [id number]:  Uses heat application to help with pain relief in her shoulders, using paraffin wax treatments for pain in hands with  relief.  Reviewed with patient prescribed pharmacological and nonpharmacological pain relief strategies; Advised patient to discuss uncontrolled pain, changes in level or intensity of pain  with provider;    Patient Goals/Self-Care Activities: Take medications as prescribed   Attend all scheduled provider appointments Call pharmacy for medication refills 3-7 days in advance of running out of medications Attend church or other social activities Perform all self care activities independently  Perform IADL's (shopping, preparing meals, housekeeping, managing finances) independently Call provider office for new concerns or questions  Work with the social worker to address care coordination needs and will continue to work with the clinical team to address health care and disease management related needs call the Suicide and Crisis Lifeline: 988 call the [place] National Suicide Prevention Lifeline: 859-618-1174 or TTY: 737 096 1311 TTY (380) 371-1583) to talk to a trained counselor call 1-800-273-TALK (toll free, 24 hour hotline) if experiencing a Mental Health or [place]  schedule appointment with eye doctor. 12-29-2021: Saw the eye doctor in January but blood sugars were elevated and her readings were not good. She goes back in February for re-evaluation.  check blood sugar at prescribed times: before meals and at bedtime, when you have symptoms of low or high blood sugar, and before and after exercise check feet daily for cuts, sores or redness enter blood sugar readings and medication or insulin into daily log take the blood sugar log to all doctor visits set goal weight trim toenails straight across drink 6 to 8 glasses of water each day eat fish at least once per week fill half of plate with vegetables limit fast food meals to no more than 1 per week manage portion size prepare main meal at home 3 to 5 days each week read food labels for fat, fiber, carbohydrates and  portion size reduce red meat to 2 to 3 times a week set a realistic goal do heel pump exercise 2 to 3 times each day keep feet up while sitting wash and dry feet carefully every day wear comfortable, cotton socks wear comfortable, well-fitting shoes check blood pressure weekly choose a place to take my blood pressure (home, clinic or office, retail store) write blood pressure results in a log or diary learn about high blood pressure keep a blood pressure log take blood pressure log to all doctor appointments call doctor for signs and symptoms of high blood pressure develop an action plan for high blood pressure keep all doctor appointments take medications for blood pressure exactly as prescribed report new symptoms to your doctor eat more whole grains, fruits and vegetables, lean meats and healthy fats - call for medicine refill 2 or 3 days before it runs out - take all medications exactly as prescribed - call doctor with any symptoms you believe are related to your medicine - call doctor when you experience any new symptoms - go to all doctor appointments as scheduled - adhere to prescribed diet: heart healthy/ADA diet  Our next appointment is by telephone on 03-01-2022 at 1145 am   Please call the care guide team at [phone] if you need to cancel or reschedule your appointment.   If you are experiencing a Mental Health or [place] or need someone to talk to, please call the Suicide and Crisis Lifeline: 988 call the [place] National Suicide Prevention Lifeline: 548-752-2540 or TTY: (239) 857-9364 TTY 518-513-3910) to talk to a trained counselor call 1-800-273-TALK (toll free, 24 hour hotline)   Patient verbalizes understanding of instructions and care plan provided today and agrees to view in [place]. Active MyChart status confirmed with patient.    Noreene Larsson RN, MSN, CCM Community Care Coordinator [facility] Mobile: [phone]   Exercise Information for Aging Adults Staying physically active is important as you age. Physical activity and exercise can help in maintaining quality of life, health, physical function, and reducing falls. The four types of exercises that are best for older adults are endurance, strength, balance, and flexibility. Contact your health care provider before you start any exercise routine. Ask your health care provider what activities are safe for you. What are the risks? Risks associated with exercising include: Overdoing it. This may lead to sore muscles or fatigue. Falls. Injuries. Dehydration. How to do these exercises Endurance exercises Endurance (aerobic) exercises raise your breathing rate and heart rate. Increasing your endurance helps you do everyday tasks and stay healthy. By improving the health of your body system that includes your heart, lungs, and blood vessels (circulatory system), you may also delay or prevent diseases such as heart disease, diabetes, and weak bones (osteoporosis). Types of endurance exercises include: Sports. Indoor activities, such as using gym equipment, doing water aerobics, or dancing. Outdoor activities, such as biking or jogging. Tasks around the house, such as gardening, yard work, and heavy household chores like cleaning. Walking, such as hiking or walking around your neighborhood. When doing endurance exercises, make sure you: Are aware of your surroundings. Use safety equipment as directed. Dress in layers when exercising outdoors. Drink plenty of water to stay well hydrated. Build up endurance slowly. Start with 10 minutes at a time, and gradually build up to doing 30 minutes at a time. Unless your health care provider gave you different instructions, aim to exercise for a total of 150 minutes a week. Spread out that time so you are working on endurance 3 or more days a week. Strength exercises Lifting,  pulling, or pushing weights helps to strengthen muscles. Having stronger muscles makes it easier to do everyday activities, such as getting up from a chair, climbing stairs, carrying groceries, and playing with grandchildren. Strength exercises include arm and leg exercises that may be done: With weights. Without weights (using your own body weight). With a resistance band. When doing strength exercises: Move smoothly and steadily. Do not suddenly thrust or jerk the weights, the resistance band, or your body. Start with no weights or with light weights, and gradually add more weight over time. Eventually, aim to use weights that are hard or very hard for you to lift. This means that you are able to do 8 repetitions with the weight, and the last few repetitions are very challenging. Lift or push weights into position for 3 seconds, hold the position for 1 second, and then take 3 seconds to return to your starting position. Breathe out (exhale) during difficult movements, like lifting or pushing weights. Breathe in (inhale) to relax  your muscles before the next repetition. Consider alternating arms or legs, especially when you first start strength exercises. Expect some slight muscle soreness after each session. Do strength exercises on 2 or more days a week, for 30 minutes at a time. Avoid exercising the same muscle groups two days in a row. For example, if you work on your leg muscles one day, work on your arm muscles the next day. When you can do two sets of 10-15 repetitions with a certain weight, increase the amount of weight. Balance exercises Balance exercises can help to prevent falls. Balance exercises include: Standing on one foot. Heel-to-toe walk. Balance walk. Tai chi. Make sure you have something sturdy to hold onto while doing balance exercises, such as a sturdy chair. As your balance improves, challenge yourself by holding on to the chair with one hand instead of two, and then with no  hands. Trying exercises with your eyes closed also challenges your balance, but be sure to have a sturdy surface (like a countertop) close by in case you need it. Do balance exercises as often as you want, or as often as directed by your health care provider. Flexibility exercises Flexibility exercises improve how far you can bend, straighten, move, or rotate parts of your body (range of motion). These exercises also help you do everyday activities such as getting dressed or reaching for objects. Flexibility exercises include stretching different parts of the body, and they may be done in a standing or seated position or on the floor. When stretching, make sure you: Keep a slight bend in your arms and legs. Avoid completely straightening ("locking") your joints. Do not stretch so far that you feel pain. You should feel a mild stretching feeling. You may try stretching farther as you become more flexible over time. Relax and breathe between stretches. Hold on to something sturdy for balance as needed. Hold each stretch for 10-30 seconds. Repeat each stretch 3-5 times. General safety tips Exercise in well-lit areas. Do not hold your breath during exercises or stretches. Warm up before exercising, and cool down after exercising. This can help prevent injury. Drink plenty of water during exercise or any activity that makes you sweat. If you are not sure if an exercise is safe for you, or you are not sure how to do an exercise, talk with your health care provider. This is especially important if you have had surgery on muscles, bones, or joints (orthopedic surgery). Where to find more information You can find more information about exercise for older adults from: Your local health department, fitness center, or community center. These facilities may have programs for aging adults. [employer] on Aging: http://kim-miller.com/ National Council on Aging: www.ncoa.org Summary Staying physically active  is important as you age. Doing endurance, strength, balance, and flexibility exercises can help in maintaining quality of life, health, physical function, and reducing falls. Make sure to contact your health care provider before you start any exercise routine. Ask your health care provider what activities are safe for you. This information is not intended to replace advice given to you by your health care provider. Make sure you discuss any questions you have with your health care provider. Document Revised: 03/29/2021 Document Reviewed: 03/29/2021 Elsevier Patient Education  [place]. Back Exercises These exercises help to make your trunk and back strong. They also help to keep the lower back flexible. Doing these exercises can help to prevent or lessen pain in your lower back. If you have back  pain, try to do these exercises 2-3 times each day or as told by your doctor. As you get better, do the exercises once each day. Repeat the exercises more often as told by your doctor. To stop back pain from coming back, do the exercises once each day, or as told by your doctor. Do exercises exactly as told by your doctor. Stop right away if you feel sudden pain or your pain gets worse. Exercises Single knee to chest Do these steps 3-5 times in a row for each leg: Lie on your back on a firm bed or the floor with your legs stretched out. Bring one knee to your chest. Grab your knee or thigh with both hands and hold it in place. Pull on your knee until you feel a gentle stretch in your lower back or butt. Keep doing the stretch for 10-30 seconds. Slowly let go of your leg and straighten it. Pelvic tilt Do these steps 5-10 times in a row: Lie on your back on a firm bed or the floor with your legs stretched out. Bend your knees so they point up to the ceiling. Your feet should be flat on the floor. Tighten your lower belly (abdomen) muscles to press your lower back against the floor. This will  make your tailbone point up to the ceiling instead of pointing down to your feet or the floor. Stay in this position for 5-10 seconds while you gently tighten your muscles and breathe evenly. Cat-cow Do these steps until your lower back bends more easily: Get on your hands and knees on a firm bed or the floor. Keep your hands under your shoulders, and keep your knees under your hips. You may put padding under your knees. Let your head hang down toward your chest. Tighten (contract) the muscles in your belly. Point your tailbone toward the floor so your lower back becomes rounded like the back of a cat. Stay in this position for 5 seconds. Slowly lift your head. Let the muscles of your belly relax. Point your tailbone up toward the ceiling so your back forms a sagging arch like the back of a cow. Stay in this position for 5 seconds.  Press-ups Do these steps 5-10 times in a row: Lie on your belly (face-down) on a firm bed or the floor. Place your hands near your head, about shoulder-width apart. While you keep your back relaxed and keep your hips on the floor, slowly straighten your arms to raise the top half of your body and lift your shoulders. Do not use your back muscles. You may change where you place your hands to make yourself more comfortable. Stay in this position for 5 seconds. Keep your back relaxed. Slowly return to lying flat on the floor.  Bridges Do these steps 10 times in a row: Lie on your back on a firm bed or the floor. Bend your knees so they point up to the ceiling. Your feet should be flat on the floor. Your arms should be flat at your sides, next to your body. Tighten your butt muscles and lift your butt off the floor until your waist is almost as high as your knees. If you do not feel the muscles working in your butt and the back of your thighs, slide your feet 1-2 inches (2.5-5 cm) farther away from your butt. Stay in this position for 3-5 seconds. Slowly lower your  butt to the floor, and let your butt muscles relax. If this exercise is  too easy, try doing it with your arms crossed over your chest. Belly crunches Do these steps 5-10 times in a row: Lie on your back on a firm bed or the floor with your legs stretched out. Bend your knees so they point up to the ceiling. Your feet should be flat on the floor. Cross your arms over your chest. Tip your chin a little bit toward your chest, but do not bend your neck. Tighten your belly muscles and slowly raise your chest just enough to lift your shoulder blades a tiny bit off the floor. Avoid raising your body higher than that because it can put too much stress on your lower back. Slowly lower your chest and your head to the floor. Back lifts Do these steps 5-10 times in a row: Lie on your belly (face-down) with your arms at your sides, and rest your forehead on the floor. Tighten the muscles in your legs and your butt. Slowly lift your chest off the floor while you keep your hips on the floor. Keep the back of your head in line with the curve in your back. Look at the floor while you do this. Stay in this position for 3-5 seconds. Slowly lower your chest and your face to the floor. Contact a doctor if: Your back pain gets a lot worse when you do an exercise. Your back pain does not get better within 2 hours after you exercise. If you have any of these problems, stop doing the exercises. Do not do them again unless your doctor says it is okay. Get help right away if: You have sudden, very bad back pain. If this happens, stop doing the exercises. Do not do them again unless your doctor says it is okay. This information is not intended to replace advice given to you by your health care provider. Make sure you discuss any questions you have with your health care provider. Document Revised: 01/27/2021 Document Reviewed: 01/27/2021 Elsevier Patient Education  2022 [place]. Neck Exercises Ask your health  care provider which exercises are safe for you. Do exercises exactly as told by your health care provider and adjust them as directed. It is normal to feel mild stretching, pulling, tightness, or discomfort as you do these exercises. Stop right away if you feel sudden pain or your pain gets worse. Do not begin these exercises until told by your health care provider. Neck exercises can be important for many reasons. They can improve strength and maintain flexibility in your neck, which will help your upper back and prevent neck pain. Stretching exercises Rotation neck stretching  Sit in a chair or stand up. Place your feet flat on the floor, shoulder-width apart. Slowly turn your head (rotate) to the right until a slight stretch is felt. Turn it all the way to the right so you can look over your right shoulder. Do not tilt or tip your head. Hold this position for 10-30 seconds. Slowly turn your head (rotate) to the left until a slight stretch is felt. Turn it all the way to the left so you can look over your left shoulder. Do not tilt or tip your head. Hold this position for 10-30 seconds. Repeat __________ times. Complete this exercise __________ times a day. Neck retraction  Sit in a sturdy chair or stand up. Look straight ahead. Do not bend your neck. Use your fingers to push your chin backward (retraction). Do not bend your neck for this movement. Continue to face straight ahead.  If you are doing the exercise properly, you will feel a slight sensation in your throat and a stretch at the back of your neck. Hold the stretch for 1-2 seconds. Repeat __________ times. Complete this exercise __________ times a day. Strengthening exercises Neck press  Lie on your back on a firm bed or on the floor with a pillow under your head. Use your neck muscles to push your head down on the pillow and straighten your spine. Hold the position as well as you can. Keep your head facing up (in a neutral  position) and your chin tucked. Slowly count to 5 while holding this position. Repeat __________ times. Complete this exercise __________ times a day. Isometrics These are exercises in which you strengthen the muscles in your neck while keeping your neck still (isometrics). Sit in a supportive chair and place your hand on your forehead. Keep your head and face facing straight ahead. Do not flex or extend your neck while doing isometrics. Push forward with your head and neck while pushing back with your hand. Hold for 10 seconds. Do the sequence again, this time putting your hand against the back of your head. Use your head and neck to push backward against the hand pressure. Finally, do the same exercise on either side of your head, pushing sideways against the pressure of your hand. Repeat __________ times. Complete this exercise __________ times a day. Prone head lifts  Lie face-down (prone position), resting on your elbows so that your chest and upper back are raised. Start with your head facing downward, near your chest. Position your chin either on or near your chest. Slowly lift your head upward. Lift until you are looking straight ahead. Then continue lifting your head as far back as you can comfortably stretch. Hold your head up for 5 seconds. Then slowly lower it to your starting position. Repeat __________ times. Complete this exercise __________ times a day. Supine head lifts  Lie on your back (supine position), bending your knees to point to the ceiling and keeping your feet flat on the floor. Lift your head slowly off the floor, raising your chin toward your chest. Hold for 5 seconds. Repeat __________ times. Complete this exercise __________ times a day. Scapular retraction  Stand with your arms at your sides. Look straight ahead. Slowly pull both shoulders (scapulae) backward and downward (retraction) until you feel a stretch between your shoulder blades in your upper  back. Hold for 10-30 seconds. Relax and repeat. Repeat __________ times. Complete this exercise __________ times a day. Contact a health care provider if: Your neck pain or discomfort gets worse when you do an exercise. Your neck pain or discomfort does not improve within 2 hours after you exercise. If you have any of these problems, stop exercising right away. Do not do the exercises again unless your health care provider says that you can. Get help right away if: You develop sudden, severe neck pain. If this happens, stop exercising right away. Do not do the exercises again unless your health care provider says that you can. This information is not intended to replace advice given to you by your health care provider. Make sure you discuss any questions you have with your health care provider. Document Revised: 05/11/2021 Document Reviewed: 05/11/2021 Elsevier Patient Education  [place].

## 2021-12-29 NOTE — Chronic Care Management (AMB) (Signed)
Chronic Care Management   CCM RN Visit Note  12/29/2021 Name: Kim Thomas MRN: [id number] DOB: 04/24/1956  Subjective: Kim Thomas is a 66 y.o. year old female who is a primary care patient of Cannady, [name], NP. The care management team was consulted for assistance with disease management and care coordination needs.    Engaged with patient by telephone for follow up visit in response to provider referral for case management and/or care coordination services.   Consent to Services:  The patient was given information about Chronic Care Management services, agreed to services, and gave verbal consent prior to initiation of services.  Please see initial visit note for detailed documentation.   Patient agreed to services and verbal consent obtained.   Assessment: Review of patient past medical history, allergies, medications, health status, including review of consultants reports, laboratory and other test data, was performed as part of comprehensive evaluation and provision of chronic care management services.   SDOH (Social Determinants of Health) assessments and interventions performed:    CCM Care Plan  Allergies  Allergen Reactions   Morphine And Related Itching    It burned, and made patient itch and go out of her mind.   Erythromycin     "Been so long ago," patient unable to remember reaction   Hydrocodone-Acetaminophen Itching    Outpatient Encounter Medications as of 12/29/2021  Medication Sig Note   acetaminophen (TYLENOL) 500 MG tablet Take 500 mg by mouth daily as needed. 07/13/2020: Taking one tablet twice a day.   ascorbic acid (VITAMIN C) 1000 MG tablet Take by mouth.    Aspirin (VAZALORE) 81 MG CAPS Take by mouth.    atorvastatin (LIPITOR) 20 MG tablet TAKE 1 TABLET BY MOUTH  DAILY    Blood Glucose Monitoring Suppl (ONE TOUCH ULTRA 2) w/Device KIT Use to check blood sugar 3-4 times daily and document for visits. ICD E11.21    CALCIUM CITRATE PO Take 1,000 mg by  mouth daily.    Cholecalciferol (VITAMIN D3) 2000 units TABS Take 2,000 Units by mouth daily.    Co-Enzyme Q-10 100 MG CAPS Take 50 mg by mouth daily.    diclofenac sodium (VOLTAREN) 1 % GEL Apply topically.    EQ ALLERGY RELIEF, CETIRIZINE, 10 MG tablet Take 1 tablet by mouth once daily    fluticasone (FLONASE) 50 MCG/ACT nasal spray USE 1 TO 2 SPRAYS IN BOTH  NOSTRILS TWICE DAILY    gabapentin (NEURONTIN) 400 MG capsule TAKE 1 CAPSULE BY MOUTH 3  TIMES DAILY    Lancets (ONETOUCH DELICA PLUS [id number]) MISC 1 each by Does not apply route 2 (two) times daily.    Magnesium 400 MG TABS Take by mouth.    Omega-3 1000 MG CAPS Take 1 capsule by mouth daily.    omeprazole (PRILOSEC) 20 MG capsule Take 1 capsule (20 mg total) by mouth daily.    ONETOUCH ULTRA test strip USE TO CHECK BLOOD SUGAR  DAILY    OVER THE COUNTER MEDICATION Take 2 tablets by mouth daily. Alive Diabetic Multivitamin    Potassium 99 MG TABS Take by mouth.    Semaglutide,0.25 or 0.5MG/DOS, (OZEMPIC, 0.25 OR 0.5 MG/DOSE,) 2 MG/1.5ML SOPN Inject 0.5 mg into the skin once a week.    sodium bicarbonate 650 MG tablet Take by mouth.    telmisartan (MICARDIS) 40 MG tablet Take 1 tablet (40 mg total) by mouth daily.    triamcinolone cream (KENALOG) 0.1 % Apply 1 application topically 2 (two) times daily.  vitamin E 100 UNIT capsule Take 100 Units by mouth daily.    No facility-administered encounter medications on file as of 12/29/2021.    Patient Active Problem List   Diagnosis Date Noted   Chronic inflammatory arthritis 12/13/2021   Pain in both hands 96/22/2979   Cyclic citrullinated peptide (CCP) antibody positive 12/13/2021   Chronic left-sided low back pain 08/09/2021   Obesity 07/17/2020   Osteopenia of neck of left femur 03/04/2020   Multinodular thyroid 01/02/2020   Vitamin D deficiency 12/03/2019   Hyperlipidemia associated with type 2 diabetes mellitus ([place]) 04/03/2018   Advanced care planning/counseling discussion  02/20/2017   Elevated uric acid in blood 06/21/2016   Allergic rhinitis 06/06/2016   CKD stage 3 due to type 2 diabetes mellitus ([place]) 06/06/2016   Type 2 diabetes mellitus with diabetic nephropathy ([place]) 05/25/2016   Hypertension associated with diabetes ([employer]) 05/25/2016   Sleep apnea 05/25/2016   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    Conditions to be addressed/monitored:HTN, HLD, DMII, and Chronic pain  Care Plan : RNCM: General Plan of Care (Adult) for Chronic Disease Management and Care Coordination Needs  Updates made by Vanita Ingles, RN since 12/29/2021 12:00 AM     Problem: RNCM: Development of Plan of Care for Chronic Disease Management (HTN, HLD, DM2, Chronic pain)   Priority: High     Long-Range Goal: RNCM: Effective Management  of Plan of Care for Chronic Disease Management (HTN, HLD, DM2, chronic pain)   Start Date: 10/27/2021  Expected End Date: 10/27/2022  Priority: High  Note:   Current Barriers:  Knowledge Deficits related to plan of care for management of HTN, HLD, and DMII  Chronic Disease Management support and education needs related to HTN, HLD, and DMII  RNCM Clinical Goal(s):  Patient will verbalize basic understanding of HTN, HLD, Chronic pain, and DMII disease process and self health management plan as evidenced by keeping appointments, following the plan of care, calling the office for changes or questions, and working with the CCM team to optimize health and well being  take all medications exactly as prescribed and will call provider for medication related questions as evidenced by compliance and calling for refills before running out of medications     attend all scheduled medical appointments: 02-07-2022 and 1 pm as evidenced by keeping appointments and calling the office for schedule change needs         demonstrate a decrease in HTN, HLD, chronic pain, and DMII exacerbations  as evidenced by stable conditions, and working with the pcp and CCM team to optimize  health and well being  demonstrate ongoing self health care management ability to effectively manage chronic conditions  as evidenced by working with the CCM team through collaboration with Consulting civil engineer, provider, and care team.   Interventions: 1:1 collaboration with primary care provider regarding development and update of comprehensive plan of care as evidenced by provider attestation and co-signature Inter-disciplinary care team collaboration (see longitudinal plan of care) Evaluation of current treatment plan related to  self management and patient's adherence to plan as established by provider   Diabetes:  (Status: Goal on Track (progressing): YES.) Long Term Goal   Lab Results  Component Value Date   HGBA1C 5.7 (H) 11/09/2021  Assessed patient's understanding of A1c goal: <7% Provided education to patient about basic DM disease process; Reviewed medications with patient and discussed importance of medication adherence. 12-29-2021: Is compliant with medications. She states that she is monitoring her medications and  doing well;        Reviewed prescribed diet with patient heart healthy/ADA. 12-29-2021: The patient is compliant with heart healthy/ADA diet. Is mindful of dietary restrictions ; Counseled on importance of regular laboratory monitoring as prescribed;        Discussed plans with patient for ongoing care management follow up and provided patient with direct contact information for care management team;      Provided patient with written educational materials related to hypo and hyperglycemia and importance of correct treatment. 12-29-2021: The patient denies any lows. The patient states lowest has been 104;       Reviewed scheduled/upcoming provider appointments including: 02-07-2022 at 1 pm         Advised patient, providing education and rationale, to check cbg when you have symptoms of low or high blood sugar, before and after exercise, and as directed   and record. 12-29-2021: The  patient is taking blood sugars and writing down the values. The patient is pleased with her blood sugars and changes in DM management.       call provider for findings outside established parameters;       Review of patient status, including review of consultants reports, relevant laboratory and other test results, and medications completed;       Screening for signs and symptoms of depression related to chronic disease state;        Assessed social determinant of health barriers;         Hyperlipidemia:  (Status: Goal on Track (progressing): YES.) Long Term Goal  Lab Results  Component Value Date   CHOL 136 11/09/2021   HDL 51 11/09/2021   LDLCALC 61 11/09/2021   TRIG 135 11/09/2021   CHOLHDL 2.5 07/10/2018     Medication review performed; medication list updated in electronic medical record. 12-29-2021: The patient is compliant with medications. The patient denies any issues with medications compliance.  Provider established cholesterol goals reviewed. 12-29-2021: The patient is at goal. Praise for being in range. Counseled on importance of regular laboratory monitoring as prescribed. 12-29-2021: The patient has regular lab work Provided HLD educational materials; Reviewed role and benefits of statin for ASCVD risk reduction; Discussed strategies to manage statin-induced myalgias; Reviewed importance of limiting foods high in cholesterol. 12-29-2021: The patient states she is mindful of her dietary restrictions  Hypertension: (Status: Goal on Track (progressing): YES.) Last practice recorded BP readings:  BP Readings from Last 3 Encounters:  12/13/21 129/78  11/09/21 115/73  08/09/21 121/71  Most recent eGFR/CrCl:  Lab Results  Component Value Date   EGFR 52 (L) 11/09/2021    No components found for: CRCL  Evaluation of current treatment plan related to hypertension self management and patient's adherence to plan as established by provider. 12-29-2021: The patient states that she is  managing her blood pressures and has no new issues related HTN and heart health   Provided education to patient re: stroke prevention, s/s of heart attack and stroke; Reviewed prescribed diet heart healthy/ADA diet. 12-29-2021: The patient is compliant with heart healthy/ADA diet. Reviewed medications with patient and discussed importance of compliance. 12-29-2021: The patient is compliant with medications.   Discussed plans with patient for ongoing care management follow up and provided patient with direct contact information for care management team; Advised patient, providing education and rationale, to monitor blood pressure daily and record, calling PCP for findings outside established parameters;  Provided education on prescribed diet heart healthy/ADA;  Discussed complications of poorly controlled  blood pressure such as heart disease, stroke, circulatory complications, vision complications, kidney impairment, sexual dysfunction;    Pain:  (Status: Goal on Track (progressing): YES.) Long Term Goal  Pain assessment performed. 10-27-2021: Rates her pain at a 5 today on a scale of 0-10. States the weather changes impact her pain level but she has a high tolerance. 12-29-2021: The patient states she has no pain today. She is going to a specialist at the end of the month.  Medications reviewed. 10-27-2021: Is compliant with medications  Reviewed provider established plan for pain management.  12-29-2021: The patient has been seeing the chiropractor and this is working well for her. He has given her stretching exercises to help with management of her shoulder pain and discomfort. Saw chiropractor yesterday and they worked on the area in her back and leg where she had an incident at [place] recently. She states she wants to know about exercises to strengthen her back. Will send information by the [facility] platform and my chart for the patient. Also is having pain in hands. Saw pcp recently and will see the  specialist. She is using paraffin wax treatment BID for relief of hand pain. She is hopeful that the specialist will be able to help her more also. Will continue to monitor for changes.  Discussed importance of adherence to all scheduled medical appointments. Pcp 02-07-2022 and specialist the end of February; Counseled on the importance of reporting any/all new or changed pain symptoms or management strategies to pain management provider; Advised patient to report to care team affect of pain on daily activities; Discussed use of relaxation techniques and/or diversional activities to assist with pain reduction (distraction, imagery, relaxation, massage, acupressure, TENS, heat, and cold application. 07-30-3299:  Uses heat application to help with pain relief in her shoulders, using paraffin wax treatments for pain in hands with relief.  Reviewed with patient prescribed pharmacological and nonpharmacological pain relief strategies; Advised patient to discuss uncontrolled pain, changes in level or intensity of pain  with provider;   Patient Goals/Self-Care Activities: Take medications as prescribed   Attend all scheduled provider appointments Call pharmacy for medication refills 3-7 days in advance of running out of medications Attend church or other social activities Perform all self care activities independently  Perform IADL's (shopping, preparing meals, housekeeping, managing finances) independently Call provider office for new concerns or questions  Work with the social worker to address care coordination needs and will continue to work with the clinical team to address health care and disease management related needs call the Suicide and Crisis Lifeline: 988 call the [place] National Suicide Prevention Lifeline: 225-751-9943 or TTY: (864)368-8932 TTY 365-438-6242) to talk to a trained counselor call 1-800-273-TALK (toll free, 24 hour hotline) if experiencing a Mental Health or [place]  schedule appointment with eye doctor. 12-29-2021: Saw the eye doctor in January but blood sugars were elevated and her readings were not good. She goes back in February for re-evaluation.  check blood sugar at prescribed times: before meals and at bedtime, when you have symptoms of low or high blood sugar, and before and after exercise check feet daily for cuts, sores or redness enter blood sugar readings and medication or insulin into daily log take the blood sugar log to all doctor visits set goal weight trim toenails straight across drink 6 to 8 glasses of water each day eat fish at least once per week fill half of plate with vegetables limit fast food meals to no more  than 1 per week manage portion size prepare main meal at home 3 to 5 days each week read food labels for fat, fiber, carbohydrates and portion size reduce red meat to 2 to 3 times a week set a realistic goal do heel pump exercise 2 to 3 times each day keep feet up while sitting wash and dry feet carefully every day wear comfortable, cotton socks wear comfortable, well-fitting shoes check blood pressure weekly choose a place to take my blood pressure (home, clinic or office, retail store) write blood pressure results in a log or diary learn about high blood pressure keep a blood pressure log take blood pressure log to all doctor appointments call doctor for signs and symptoms of high blood pressure develop an action plan for high blood pressure keep all doctor appointments take medications for blood pressure exactly as prescribed report new symptoms to your doctor eat more whole grains, fruits and vegetables, lean meats and healthy fats - call for medicine refill 2 or 3 days before it runs out - take all medications exactly as prescribed - call doctor with any symptoms you believe are related to your medicine - call doctor when you experience any new symptoms - go to all doctor appointments as scheduled -  adhere to prescribed diet: heart healthy/ADA diet        Plan:Telephone follow up appointment with care management team member scheduled for:  03-01-2022 at [place] am Noreene Larsson RN, MSN, [facility] Mobile: [phone]

## 2022-01-04 DIAGNOSIS — M5432 Sciatica, left side: Secondary | ICD-10-CM | POA: Diagnosis not present

## 2022-01-04 DIAGNOSIS — M9905 Segmental and somatic dysfunction of pelvic region: Secondary | ICD-10-CM | POA: Diagnosis not present

## 2022-01-04 DIAGNOSIS — M5416 Radiculopathy, lumbar region: Secondary | ICD-10-CM | POA: Diagnosis not present

## 2022-01-04 DIAGNOSIS — M9903 Segmental and somatic dysfunction of lumbar region: Secondary | ICD-10-CM | POA: Diagnosis not present

## 2022-01-11 DIAGNOSIS — M9903 Segmental and somatic dysfunction of lumbar region: Secondary | ICD-10-CM | POA: Diagnosis not present

## 2022-01-11 DIAGNOSIS — M5432 Sciatica, left side: Secondary | ICD-10-CM | POA: Diagnosis not present

## 2022-01-11 DIAGNOSIS — M5416 Radiculopathy, lumbar region: Secondary | ICD-10-CM | POA: Diagnosis not present

## 2022-01-11 DIAGNOSIS — M9905 Segmental and somatic dysfunction of pelvic region: Secondary | ICD-10-CM | POA: Diagnosis not present

## 2022-01-18 DIAGNOSIS — M5416 Radiculopathy, lumbar region: Secondary | ICD-10-CM | POA: Diagnosis not present

## 2022-01-18 DIAGNOSIS — M9903 Segmental and somatic dysfunction of lumbar region: Secondary | ICD-10-CM | POA: Diagnosis not present

## 2022-01-18 DIAGNOSIS — M5432 Sciatica, left side: Secondary | ICD-10-CM | POA: Diagnosis not present

## 2022-01-18 DIAGNOSIS — M9905 Segmental and somatic dysfunction of pelvic region: Secondary | ICD-10-CM | POA: Diagnosis not present
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
## 2022-01-20 ENCOUNTER — Ambulatory Visit: Payer: Self-pay

## 2022-01-20 ENCOUNTER — Ambulatory Visit (INDEPENDENT_AMBULATORY_CARE_PROVIDER_SITE_OTHER): Payer: Medicare Other | Admitting: Internal Medicine

## 2022-01-20 ENCOUNTER — Encounter: Payer: Self-pay | Admitting: Internal Medicine

## 2022-01-20 ENCOUNTER — Other Ambulatory Visit: Payer: Self-pay

## 2022-01-20 VITALS — BP 127/80 | HR 88 | Resp 16 | Ht 64.0 in | Wt 193.0 lb

## 2022-01-20 DIAGNOSIS — M1990 Unspecified osteoarthritis, unspecified site: Secondary | ICD-10-CM | POA: Diagnosis not present

## 2022-01-20 DIAGNOSIS — M059 Rheumatoid arthritis with rheumatoid factor, unspecified: Secondary | ICD-10-CM | POA: Diagnosis not present

## 2022-01-20 DIAGNOSIS — M79641 Pain in right hand: Secondary | ICD-10-CM

## 2022-01-20 DIAGNOSIS — R768 Other specified abnormal immunological findings in serum: Secondary | ICD-10-CM | POA: Diagnosis not present

## 2022-01-20 DIAGNOSIS — R278 Other lack of coordination: Secondary | ICD-10-CM

## 2022-01-20 DIAGNOSIS — M79642 Pain in left hand: Secondary | ICD-10-CM | POA: Diagnosis not present

## 2022-01-20 MED ORDER — HYDROXYCHLOROQUINE SULFATE 200 MG PO TABS: 400.0000 mg | ORAL_TABLET | Freq: Every day | ORAL | 2 refills | Status: DC

## 2022-01-20 NOTE — Patient Instructions (Signed)
Hydroxychloroquine Tablets °What is this medication? °HYDROXYCHLOROQUINE (hye drox ee KLOR oh kwin) treats autoimmune conditions, such as rheumatoid arthritis and lupus. It works by slowing down an overactive immune system. It may also be used to prevent and treat malaria. It works by killing the parasite that causes malaria. It belongs to a group of medications called DMARDs. °This medicine may be used for other purposes; ask your health care provider or pharmacist if you have questions. °COMMON BRAND NAME(S): Plaquenil, Quineprox °What should I tell my care team before I take this medication? °They need to know if you have any of these conditions: °Diabetes °Eye disease, vision problems °G6PD deficiency °Heart disease °History of irregular heartbeat °If you often drink alcohol °Kidney disease °Liver disease °Porphyria °Psoriasis °An unusual or allergic reaction to chloroquine, hydroxychloroquine, other medications, foods, dyes, or preservatives °Pregnant or trying to get pregnant °Breast-feeding °How should I use this medication? °Take this medication by mouth with a glass of water. Take it as directed on the prescription label. Do not cut, crush or chew this medication. Swallow the tablets whole. Take it with food. Do not take it more than directed. Take all of this medication unless your care team tells you to stop it early. Keep taking it even if you think you are better. °Take products with antacids in them at a different time of day than this medication. Take this medication 4 hours before or 4 hours after antacids. Talk to your care team if you have questions. °Talk to your care team about the use of this medication in children. While this medication may be prescribed for selected conditions, precautions do apply. °Overdosage: If you think you have taken too much of this medicine contact a poison control center or emergency room at once. °NOTE: This medicine is only for you. Do not share this medicine with  others. °What if I miss a dose? °If you miss a dose, take it as soon as you can. If it is almost time for your next dose, take only that dose. Do not take double or extra doses. °What may interact with this medication? °Do not take this medication with any of the following: °Cisapride °Dronedarone °Pimozide °Thioridazine °This medication may also interact with the following: °Ampicillin °Antacids °Cimetidine °Cyclosporine °Digoxin °Kaolin °Medications for diabetes, like insulin, glipizide, glyburide °Medications for seizures like carbamazepine, phenobarbital, phenytoin °Mefloquine °Methotrexate °Other medications that prolong the QT interval (cause an abnormal heart rhythm) °Praziquantel °This list may not describe all possible interactions. Give your health care provider a list of all the medicines, herbs, non-prescription drugs, or dietary supplements you use. Also tell them if you smoke, drink alcohol, or use illegal drugs. Some items may interact with your medicine. °What should I watch for while using this medication? °Visit your care team for regular checks on your progress. Tell your care team if your symptoms do not start to get better or if they get worse. °You may need blood work done while you are taking this medication. If you take other medications that can affect heart rhythm, you may need more testing. Talk to your care team if you have questions. °Your vision may be tested before and during use of this medication. Tell your care team right away if you have any change in your eyesight. °This medication may cause serious skin reactions. They can happen weeks to months after starting the medication. Contact your care team right away if you notice fevers or flu-like symptoms with a rash. The   rash may be red or purple and then turn into blisters or peeling of the skin. Or, you might notice a red rash with swelling of the face, lips or lymph nodes in your neck or under your arms. °If you or your family  notice any changes in your behavior, such as new or worsening depression, thoughts of harming yourself, anxiety, or other unusual or disturbing thoughts, or memory loss, call your care team right away. °What side effects may I notice from receiving this medication? °Side effects that you should report to your care team as soon as possible: °Allergic reactions--skin rash, itching, hives, swelling of the face, lips, tongue, or throat °Aplastic anemia--unusual weakness or fatigue, dizziness, headache, trouble breathing, increased bleeding or bruising °Change in vision °Heart rhythm changes--fast or irregular heartbeat, dizziness, feeling faint or lightheaded, chest pain, trouble breathing °Infection--fever, chills, cough, or sore throat °Low blood sugar (hypoglycemia)--tremors or shaking, anxiety, sweating, cold or clammy skin, confusion, dizziness, rapid heartbeat °Muscle injury--unusual weakness or fatigue, muscle pain, dark yellow or brown urine, decrease in amount of urine °Pain, tingling, or numbness in the hands or feet °Rash, fever, and swollen lymph nodes °Redness, blistering, peeling, or loosening of the skin, including inside the mouth °Thoughts of suicide or self-harm, worsening mood, or feelings of depression °Unusual bruising or bleeding °Side effects that usually do not require medical attention (report to your care team if they continue or are bothersome): °Diarrhea °Headache °Nausea °Stomach pain °Vomiting °This list may not describe all possible side effects. Call your doctor for medical advice about side effects. You may report side effects to FDA at 1-800-FDA-1088. °Where should I keep my medication? °Keep out of the reach of children and pets. °Store at room temperature up to 30 degrees C (86 degrees F). Protect from light. Get rid of any unused medication after the expiration date. °To get rid of medications that are no longer needed or have expired: °Take the medication to a medication take-back  program. Check with your pharmacy or law enforcement to find a location. °If you cannot return the medication, check the label or package insert to see if the medication should be thrown out in the garbage or flushed down the toilet. If you are not sure, ask your care team. If it is safe to put it in the trash, empty the medication out of the container. Mix the medication with cat litter, dirt, coffee grounds, or other unwanted substance. Seal the mixture in a bag or container. Put it in the trash. °NOTE: This sheet is a summary. It may not cover all possible information. If you have questions about this medicine, talk to your doctor, pharmacist, or health care provider. °© 2022 [employer]vier/Gold Standard (2021-04-01 00:00:00) ° °

## 2022-01-25 DIAGNOSIS — I152 Hypertension secondary to endocrine disorders: Secondary | ICD-10-CM

## 2022-01-25 DIAGNOSIS — E1159 Type 2 diabetes mellitus with other circulatory complications: Secondary | ICD-10-CM | POA: Diagnosis not present

## 2022-01-25 DIAGNOSIS — E1121 Type 2 diabetes mellitus with diabetic nephropathy: Secondary | ICD-10-CM | POA: Diagnosis not present

## 2022-01-25 DIAGNOSIS — M9903 Segmental and somatic dysfunction of lumbar region: Secondary | ICD-10-CM | POA: Diagnosis not present

## 2022-01-25 DIAGNOSIS — E785 Hyperlipidemia, unspecified: Secondary | ICD-10-CM | POA: Diagnosis not present

## 2022-01-25 DIAGNOSIS — M5432 Sciatica, left side: Secondary | ICD-10-CM | POA: Diagnosis not present

## 2022-01-25 DIAGNOSIS — M1990 Unspecified osteoarthritis, unspecified site: Secondary | ICD-10-CM | POA: Diagnosis not present

## 2022-01-25 DIAGNOSIS — E1169 Type 2 diabetes mellitus with other specified complication: Secondary | ICD-10-CM | POA: Diagnosis not present

## 2022-01-25 DIAGNOSIS — M9905 Segmental and somatic dysfunction of pelvic region: Secondary | ICD-10-CM | POA: Diagnosis not present

## 2022-01-25 DIAGNOSIS — M5416 Radiculopathy, lumbar region: Secondary | ICD-10-CM | POA: Diagnosis not present

## 2022-01-26 ENCOUNTER — Other Ambulatory Visit: Payer: Self-pay | Admitting: Nurse Practitioner

## 2022-01-26 NOTE — Telephone Encounter (Signed)
Requested medication (s) are due for refill today - no ? ?Requested medication (s) are on the active medication list -yes ? ?Future visit scheduled -yes ? ?Last refill: 12/11/20 ? ?Notes to clinic: Patient has concerns that she needs new meter- she had elevated readings today and she thinks she was actually low instead of high- patient does not think her meter is working correctly and would like a new one. 11:45- vision changes while reading- level- [phone],- patient ate lunch and is now at 120. Patient advised I would send her request to PCP. Note: patient has appointment  ? ?Requested Prescriptions  ?Pending Prescriptions Disp Refills  ? Blood Glucose Monitoring Suppl (ONE TOUCH ULTRA 2) w/Device KIT [Pharmacy Med Name: OneTouch Ultra 2 w/Device Kit]    ?  Sig: USE TO CHECK BLOOD SUGAR 3  TO 4 TIMES DAILY AND  DOCUMENT FOR VISITS  ?  ? Endocrinology: Diabetes - Testing Supplies Passed - 01/26/2022 11:47 AM  ?  ?  Passed - Valid encounter within last 12 months  ?  Recent Outpatient Visits   ? ?      ? 1 month ago Chronic inflammatory arthritis  ? [place], [place] T, NP  ? 2 months ago Type 2 diabetes mellitus with diabetic nephropathy, without long-term current use of insulin ([place])  ? [place], Stanwood T, NP  ? 5 months ago Type 2 diabetes mellitus with diabetic nephropathy, without long-term current use of insulin ([place])  ? [facility] Normal, [place] T, NP  ? 6 months ago Acute left-sided low back pain with left-sided sciatica  ? [place], Megan P, DO  ? 8 months ago Type 2 diabetes mellitus with diabetic nephropathy, without long-term current use of insulin ([place])  ? [facility] [place], Henrine Screws T, NP  ? ?  ?  ?Future Appointments   ? ?        ? In 1 week Venita Lick, NP [employer], [facility]  ? In 2 months Rice, Resa Miner, MD [facility]  ? In 6 months  [place], [facility]  ? ?   ? ?  ?  ?  ? ? ? ?Requested Prescriptions  ?Pending Prescriptions Disp Refills  ? Blood Glucose Monitoring Suppl (ONE TOUCH ULTRA 2) w/Device KIT [Pharmacy Med Name: OneTouch Ultra 2 w/Device Kit]    ?  Sig: USE TO CHECK BLOOD SUGAR 3  TO 4 TIMES DAILY AND  DOCUMENT FOR VISITS  ?  ? Endocrinology: Diabetes - Testing Supplies Passed - 01/26/2022 11:47 AM  ?  ?  Passed - Valid encounter within last 12 months  ?  Recent Outpatient Visits   ? ?      ? 1 month ago Chronic inflammatory arthritis  ? [place], [place] T, NP  ? 2 months ago Type 2 diabetes mellitus with diabetic nephropathy, without long-term current use of insulin ([place])  ? [place], Ludell T, NP  ? 5 months ago Type 2 diabetes mellitus with diabetic nephropathy, without long-term current use of insulin ([place])  ? [facility] [place], [place] T, NP  ? 6 months ago Acute left-sided low back pain with left-sided sciatica  ? [place], Megan P, DO  ? 8 months ago Type 2 diabetes mellitus with diabetic nephropathy, without long-term current use of insulin ([place])  ? [facility] [place], Henrine Screws T, NP  ? ?  ?  ?  Future Appointments   ? ?        ? In 1 week Venita Lick, NP [employer], [facility]  ? In 2 months Rice, Resa Miner, MD [facility]  ? In 6 months  [place], [facility]  ? ?  ? ?  ?  ?  ? ? ? ?

## 2022-02-01 ENCOUNTER — Other Ambulatory Visit: Payer: Self-pay | Admitting: Nurse Practitioner

## 2022-02-01 DIAGNOSIS — Z1231 Encounter for screening mammogram for malignant neoplasm of breast: Secondary | ICD-10-CM

## 2022-02-01 DIAGNOSIS — M9905 Segmental and somatic dysfunction of pelvic region: Secondary | ICD-10-CM | POA: Diagnosis not present

## 2022-02-01 DIAGNOSIS — M5432 Sciatica, left side: Secondary | ICD-10-CM | POA: Diagnosis not present

## 2022-02-01 DIAGNOSIS — M5416 Radiculopathy, lumbar region: Secondary | ICD-10-CM | POA: Diagnosis not present

## 2022-02-01 DIAGNOSIS — M9903 Segmental and somatic dysfunction of lumbar region: Secondary | ICD-10-CM | POA: Diagnosis not present

## 2022-02-05 NOTE — Patient Instructions (Signed)

## 2022-02-07 ENCOUNTER — Ambulatory Visit (INDEPENDENT_AMBULATORY_CARE_PROVIDER_SITE_OTHER): Payer: Medicare Other | Admitting: Nurse Practitioner

## 2022-02-07 ENCOUNTER — Encounter: Payer: Self-pay | Admitting: Nurse Practitioner

## 2022-02-07 ENCOUNTER — Other Ambulatory Visit: Payer: Self-pay

## 2022-02-07 VITALS — BP 108/69 | HR 85 | Temp 98.7°F | Ht 64.0 in | Wt 192.0 lb

## 2022-02-07 DIAGNOSIS — E1122 Type 2 diabetes mellitus with diabetic chronic kidney disease: Secondary | ICD-10-CM | POA: Diagnosis not present

## 2022-02-07 DIAGNOSIS — I152 Hypertension secondary to endocrine disorders: Secondary | ICD-10-CM

## 2022-02-07 DIAGNOSIS — M85852 Other specified disorders of bone density and structure, left thigh: Secondary | ICD-10-CM

## 2022-02-07 DIAGNOSIS — E1159 Type 2 diabetes mellitus with other circulatory complications: Secondary | ICD-10-CM | POA: Diagnosis not present

## 2022-02-07 DIAGNOSIS — K219 Gastro-esophageal reflux disease without esophagitis: Secondary | ICD-10-CM

## 2022-02-07 DIAGNOSIS — N183 Chronic kidney disease, stage 3 unspecified: Secondary | ICD-10-CM | POA: Diagnosis not present

## 2022-02-07 DIAGNOSIS — E1121 Type 2 diabetes mellitus with diabetic nephropathy: Secondary | ICD-10-CM | POA: Diagnosis not present

## 2022-02-07 DIAGNOSIS — M059 Rheumatoid arthritis with rheumatoid factor, unspecified: Secondary | ICD-10-CM

## 2022-02-07 DIAGNOSIS — E785 Hyperlipidemia, unspecified: Secondary | ICD-10-CM | POA: Diagnosis not present

## 2022-02-07 DIAGNOSIS — E1169 Type 2 diabetes mellitus with other specified complication: Secondary | ICD-10-CM | POA: Diagnosis not present

## 2022-02-07 DIAGNOSIS — E6609 Other obesity due to excess calories: Secondary | ICD-10-CM

## 2022-02-07 DIAGNOSIS — Z6832 Body mass index (BMI) 32.0-32.9, adult: Secondary | ICD-10-CM

## 2022-02-07 LAB — BAYER DCA HB A1C WAIVED: HB A1C (BAYER DCA - WAIVED): 5.7 % — ABNORMAL HIGH (ref 4.8–5.6)

## 2022-02-07 LAB — MICROALBUMIN, URINE WAIVED
Creatinine, Urine Waived: 50 mg/dL (ref 10–300)
Microalb, Ur Waived: 10 mg/L (ref 0–19)
Microalb/Creat Ratio: <30 mg/g (ref <30)

## 2022-02-07 NOTE — Assessment & Plan Note (Signed)
Chronic, ongoing.  BP at goal today.  Recommend she check BP at home three mornings a week and document for provider.  Continue current medication regimen and adjust as needed.  CMP and Mag level today.  Educated her on Telmisartan and kidney benefit.  Urine micro alb 10 and A:C <30 today, March 2023.  Focus on DASH diet at home.  Return in 3 months. ?

## 2022-02-07 NOTE — Progress Notes (Signed)
Hand xrays show some osteoarthritis there is no chronic damage from rheumatoid arthritis.

## 2022-02-07 NOTE — Assessment & Plan Note (Signed)
Chronic, ongoing.  A1C today 5.7%, praised for ongoing success.  Urine ALB 10 on check today (March 2023), continue Telmisartan for kidney protection.  Continue Ozempic 0.5 MG weekly -- will discuss with Kim Thomas increase to 1 MG for further weight loss affect.  Continue off Jardiance as is tolerating this. Continue increased diet focus and monitoring BS twice a day.  Recommend focus on healthy weight loss and exercise daily.  Return in 3 months. ?

## 2022-02-07 NOTE — Assessment & Plan Note (Signed)
Noted on recent DEXA -- continue supplements daily and check Vit D level next visit.  Plan for repeat DEXA in 2026.  Discussed fall prevention.   ?

## 2022-02-07 NOTE — Assessment & Plan Note (Signed)
Chronic, ongoing.  Continue current medication regimen and adjust as needed. Lipid panel today. 

## 2022-02-07 NOTE — Assessment & Plan Note (Signed)
BMI 32.96 with T2DM, HTN/HLD.  Recommended eating smaller high protein, low fat meals more frequently and exercising 30 mins a day 5 times a week with a goal of 10-15lb weight loss in the next 3 months. Patient voiced their understanding and motivation to adhere to these recommendations. ? ?

## 2022-02-07 NOTE — Assessment & Plan Note (Signed)
New diagnosis on 01/20/22, continue collaboration with rheumatology and Plaquenil as ordered by them, she is noticing improved symptoms. ?

## 2022-02-07 NOTE — Assessment & Plan Note (Signed)
Chronic, ongoing.  Continue Telmisartan for kidney protection.  Check CMP and urine ALB today.  Continue collaboration with nephrology, appreciate their input. ?

## 2022-02-07 NOTE — Progress Notes (Signed)
BP 108/69    Pulse 85    Temp 98.7 F (37.1 C) (Oral)    Ht 5\' 4"  (1.626 m)    Wt 192 lb (87.1 kg)    LMP  (LMP Unknown)    SpO2 99%    BMI 32.96 kg/m    Subjective:    Patient ID: Kim Thomas [place], female    DOB: 11-01-1956, 66 y.o.   MRN: [id number]  HPI: Kim Thomas is a 66 y.o. female  Chief Complaint  Patient presents with   Diabetes   Hyperlipidemia   Hypertension   Osteopenia   DIABETES Continues on Ozempic 0.5 MG weekly.  Stopped Jardiance on 11/19/21 due to improving A1c.  September 5.7%.  Is maintaining weight with 14 pound loss.     Continues on Gabapentin for her underlying back pain -- taking twice a day with benefit -- if legs bother her she will take a 3rd dose.  Hypoglycemic episodes: no Polydipsia/polyuria: no Visual disturbance: no Chest pain: no Paresthesias: no Glucose Monitoring: yes             Accucheck frequency: occasionally             Fasting glucose: 110 - 120 range             Post prandial:             Evening:              Before meals:  Taking Insulin?: no             Long acting insulin:             Short acting insulin: Blood Pressure Monitoring: not checking Retinal Examination: Up To Date Foot Exam: Up to Date Pneumovax: Up To Date Influenza: refuses Aspirin: yes   RHEUMATOID ARTHRITIS Had initial visit with rheumatology on 01/20/22 and was started on Plaquenil.  She is noticing a difference in pain, improving and less swelling in knuckles. Duration: months Pain: no Context:  better Decreased function/range of motion: improving Erythema: none Swelling: improved Heat or warmth: none Morning stiffness: improved Aggravating factors: none Alleviating factors: Plaquenil Relief with NSAIDs?: No NSAIDs Taken Treatments attempted:  Parafin wax, Tylenol, Plaquenil  Involved Joints:     Hands: yes bilateral    Wrists: none    Elbows: none    Shoulders: none    Back: yes     Hips: none    Knees: none    Ankles: none    Feet:  none   HYPERTENSION / HYPERLIPIDEMIA Continues on Atorvastatin 20 MG daily and Telmisartan 40 MG.  Satisfied with current treatment? yes Duration of hypertension: chronic BP monitoring frequency: not checking BP range:  BP medication side effects: no Duration of hyperlipidemia: chronic Cholesterol medication side effects: no Cholesterol supplements: none Medication compliance: good compliance Aspirin: yes Recent stressors: no Recurrent headaches: no Visual changes: no Palpitations: no Dyspnea: no Chest pain: no Lower extremity edema: no Dizzy/lightheaded: no   CHRONIC KIDNEY DISEASE Saw nephrology, Dr. Candiss Norse, last visit on 08/26/21. Continues on sodium bicarbonate per recommendation of nephrology.   CKD status: stable Medications renally dose: yes Previous renal evaluation: yes Pneumovax:  Up To Date Influenza Vaccine:  Refuses   OSTEOPENIA Noted on DEXA 01/28/2020 with T-score at left femur -2.2.  Satisfied with current treatment?: yes Past osteoporosis medications/treatments: none Adequate calcium & vitamin D: yes Weight bearing exercises: yes  Relevant past medical, surgical, family and social history reviewed and updated  as indicated. Interim medical history since our last visit reviewed. Allergies and medications reviewed and updated.  Review of Systems  Constitutional:  Negative for activity change, appetite change, diaphoresis, fatigue and fever.  Respiratory:  Negative for cough, chest tightness and shortness of breath.   Cardiovascular:  Negative for chest pain, palpitations and leg swelling.  Gastrointestinal: Negative.   Endocrine: Negative for cold intolerance, heat intolerance, polydipsia, polyphagia and polyuria.  Neurological: Negative.   Psychiatric/Behavioral: Negative.     Per HPI unless specifically indicated above     Objective:    BP 108/69    Pulse 85    Temp 98.7 F (37.1 C) (Oral)    Ht 5\' 4"  (1.626 m)    Wt 192 lb (87.1 kg)    LMP  (LMP  Unknown)    SpO2 99%    BMI 32.96 kg/m   Wt Readings from Last 3 Encounters:  02/07/22 192 lb (87.1 kg)  01/20/22 193 lb (87.5 kg)  12/13/21 192 lb 12.8 oz (87.5 kg)    Physical Exam Vitals and nursing note reviewed.  Constitutional:      General: She is awake. She is not in acute distress.    Appearance: She is well-developed. She is obese. She is not ill-appearing.  HENT:     Head: Normocephalic.     Right Ear: Hearing normal. No drainage.     Left Ear: Hearing normal. No drainage.  Eyes:     General: Lids are normal.        Right eye: No discharge.        Left eye: No discharge.     Conjunctiva/sclera: Conjunctivae normal.     Pupils: Pupils are equal, round, and reactive to light.  Neck:     Vascular: No carotid bruit.  Cardiovascular:     Rate and Rhythm: Normal rate and regular rhythm.     Heart sounds: Normal heart sounds. No murmur heard.   No gallop.  Pulmonary:     Effort: Pulmonary effort is normal. No accessory muscle usage or respiratory distress.     Breath sounds: Normal breath sounds.  Abdominal:     General: Bowel sounds are normal.     Palpations: Abdomen is soft.  Musculoskeletal:     Cervical back: Normal range of motion and neck supple.     Right lower leg: No edema.     Left lower leg: No edema.  Skin:    General: Skin is warm and dry.  Neurological:     Mental Status: She is alert and oriented to person, place, and time.  Psychiatric:        Attention and Perception: Attention normal.        Mood and Affect: Mood normal.        Speech: Speech normal.        Behavior: Behavior normal. Behavior is cooperative.        Thought Content: Thought content normal.   Results for orders placed or performed in visit on 02/07/22  Bayer DCA Hb A1c Waived  Result Value Ref Range   HB A1C (BAYER DCA - WAIVED) 5.7 (H) 4.8 - 5.6 %  Microalbumin, Urine Waived  Result Value Ref Range   Microalb, Ur Waived 10 0 - 19 mg/L   Creatinine, Urine Waived 50 10 - 300  mg/dL   Microalb/Creat Ratio <30 <30 mg/g      Assessment & Plan:   Problem List Items Addressed This Visit  Cardiovascular and Mediastinum   Hypertension associated with diabetes ([place])    Chronic, ongoing.  BP at goal today.  Recommend she check BP at home three mornings a week and document for provider.  Continue current medication regimen and adjust as needed.  CMP and Mag level today.  Educated her on Telmisartan and kidney benefit.  Urine micro alb 10 and A:C <30 today, March 2023.  Focus on DASH diet at home.  Return in 3 months.      Relevant Orders   Bayer DCA Hb A1c Waived (Completed)   Microalbumin, Urine Waived (Completed)   Comprehensive metabolic panel     Endocrine   CKD stage 3 due to type 2 diabetes mellitus (HCC)    Chronic, ongoing.  Continue Telmisartan for kidney protection.  Check CMP and urine ALB today.  Continue collaboration with nephrology, appreciate their input.      Relevant Orders   Bayer DCA Hb A1c Waived (Completed)   Microalbumin, Urine Waived (Completed)   Comprehensive metabolic panel   Hyperlipidemia associated with type 2 diabetes mellitus (HCC)    Chronic, ongoing.  Continue current medication regimen and adjust as needed.  Lipid panel today.      Relevant Orders   Bayer DCA Hb A1c Waived (Completed)   Comprehensive metabolic panel   Lipid Panel w/o Chol/HDL Ratio   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  A1C today 5.7%, praised for ongoing success.  Urine ALB 10 on check today (March 2023), continue Telmisartan for kidney protection.  Continue Ozempic 0.5 MG weekly -- will discuss with her increase to 1 MG for further weight loss affect.  Continue off Jardiance as is tolerating this. Continue increased diet focus and monitoring BS twice a day.  Recommend focus on healthy weight loss and exercise daily.  Return in 3 months.      Relevant Orders   Bayer DCA Hb A1c Waived (Completed)    Microalbumin, Urine Waived (Completed)     Musculoskeletal and Integument   Osteopenia of neck of left femur    Noted on recent DEXA -- continue supplements daily and check Vit D level next visit.  Plan for repeat DEXA in 2026.  Discussed fall prevention.        Seropositive rheumatoid arthritis ([place])    New diagnosis on 01/20/22, continue collaboration with rheumatology and Plaquenil as ordered by them, she is noticing improved symptoms.        Other   Obesity    BMI 32.96 with T2DM, HTN/HLD.  Recommended eating smaller high protein, low fat meals more frequently and exercising 30 mins a day 5 times a week with a goal of 10-15lb weight loss in the next 3 months. Patient voiced their understanding and motivation to adhere to these recommendations.       Other Visit Diagnoses     Gastroesophageal reflux disease without esophagitis       Check Mag level today.   Relevant Orders   Magnesium        Follow up plan: Return in about 3 months (around 05/10/2022) for T2DM, HTN/HLD, RA, GERD.

## 2022-02-08 LAB — COMPREHENSIVE METABOLIC PANEL
ALT: 17 IU/L (ref 0–32)
AST: 21 IU/L (ref 0–40)
Albumin/Globulin Ratio: 1.5 (ref 1.2–2.2)
Albumin: 4.4 g/dL (ref 3.8–4.8)
Alkaline Phosphatase: 81 IU/L (ref 44–121)
BUN/Creatinine Ratio: 14 (ref 12–28)
BUN: 17 mg/dL (ref 8–27)
Bilirubin Total: 0.3 mg/dL (ref 0.0–1.2)
CO2: 19 mmol/L — ABNORMAL LOW (ref 20–29)
Calcium: 9.2 mg/dL (ref 8.7–10.3)
Chloride: 101 mmol/L (ref 96–106)
Creatinine, Ser: 1.18 mg/dL — ABNORMAL HIGH (ref 0.57–1.00)
Globulin, Total: 3 g/dL (ref 1.5–4.5)
Glucose: 70 mg/dL (ref 70–99)
Potassium: 4.4 mmol/L (ref 3.5–5.2)
Sodium: 137 mmol/L (ref 134–144)
Total Protein: 7.4 g/dL (ref 6.0–8.5)
eGFR: 51 mL/min/{1.73_m2} — ABNORMAL LOW (ref >59)

## 2022-02-08 LAB — LIPID PANEL W/O CHOL/HDL RATIO
Cholesterol, Total: 112 mg/dL (ref 100–199)
HDL: 56 mg/dL (ref >39)
LDL Chol Calc (NIH): 36 mg/dL (ref 0–99)
Triglycerides: 112 mg/dL (ref 0–149)
VLDL Cholesterol Cal: 20 mg/dL (ref 5–40)

## 2022-02-08 LAB — MAGNESIUM: Magnesium: 1.9 mg/dL (ref 1.6–2.3)

## 2022-02-08 NOTE — Progress Notes (Signed)
Contacted via [place] ? ? ?Good evening Haruye, your labs have returned: ?- Kidney function, creatinine and eGFR, continues to show some mild kidney disease with no worsening.  Liver function, AST and ALT, are normal. ?- Magnesium level is normal and cholesterol levels are at goal!!  [place] job!!  Any questions? ?Keep being amazing!!  Thank you for allowing me to participate in your care.  I appreciate you. ?Kindest regards, ?Draylen Lobue ?

## 2022-02-14 ENCOUNTER — Ambulatory Visit: Payer: Medicare Other | Admitting: Internal Medicine

## 2022-02-15 DIAGNOSIS — M9903 Segmental and somatic dysfunction of lumbar region: Secondary | ICD-10-CM | POA: Diagnosis not present

## 2022-02-15 DIAGNOSIS — M5432 Sciatica, left side: Secondary | ICD-10-CM | POA: Diagnosis not present

## 2022-02-15 DIAGNOSIS — M9905 Segmental and somatic dysfunction of pelvic region: Secondary | ICD-10-CM | POA: Diagnosis not present

## 2022-02-15 DIAGNOSIS — M5416 Radiculopathy, lumbar region: Secondary | ICD-10-CM | POA: Diagnosis not present

## 2022-02-22 ENCOUNTER — Telehealth: Payer: Self-pay | Admitting: Nurse Practitioner

## 2022-02-22 MED ORDER — SEMAGLUTIDE (1 MG/DOSE) 4 MG/3ML ~~LOC~~ SOPN: 1.0000 mg | PEN_INJECTOR | SUBCUTANEOUS | 4 refills | Status: DC

## 2022-02-22 NOTE — Telephone Encounter (Signed)
Called and LVM notifying patient that the prescription was sent in for her.  ?

## 2022-02-22 NOTE — Telephone Encounter (Signed)
Copied from CRM [id number]. Topic: General - Other ?>> Feb 22, 2022  8:18 AM Payton Spark N wrote: ?Reason for CRM: Pt called in stating from recent conversation from Mychart encounter on 03/13, PCP wanted pt to try medication Ozempic for a couple of weeks, and if the pt can tolerate it they would try a higher dose, and pt called in to let PCP know she did handle it well and is ready to try a higher dose and get a refill.  ? ?Semaglutide,0.25 or 0.5MG /DOS, (OZEMPIC, 0.25 OR 0.5 MG/DOSE,) 2 MG/1.5ML SOPN ?

## 2022-02-24 ENCOUNTER — Other Ambulatory Visit: Payer: Self-pay | Admitting: Nurse Practitioner

## 2022-02-24 NOTE — Telephone Encounter (Signed)
Requested Prescriptions  ?Pending Prescriptions Disp Refills  ?? omeprazole (PRILOSEC) 20 MG capsule [Pharmacy Med Name: Omeprazole 20 MG Oral Capsule Delayed Release] 90 capsule 3  ?  Sig: TAKE 1 CAPSULE BY MOUTH  DAILY  ?  ? Gastroenterology: Proton Pump Inhibitors Passed - 02/24/2022  5:46 AM  ?  ?  Passed - Valid encounter within last 12 months  ?  Recent Outpatient Visits   ?      ? 2 weeks ago Type 2 diabetes mellitus with diabetic nephropathy, without long-term current use of insulin (HCC)  ? [facility] [place], [place] T, NP  ? 2 months ago Chronic inflammatory arthritis  ? [facility] [place], [place] T, NP  ? 3 months ago Type 2 diabetes mellitus with diabetic nephropathy, without long-term current use of insulin (HCC)  ? [facility] [place], [place] T, NP  ? 6 months ago Type 2 diabetes mellitus with diabetic nephropathy, without long-term current use of insulin (HCC)  ? [facility] [place], [place] T, NP  ? 7 months ago Acute left-sided low back pain with left-sided sciatica  ? [facility] [place], [place] P, DO  ?  ?  ?Future Appointments   ?        ? In 1 month Rice, Jamesetta [place], MD [facility]  ? In 2 months Cannady, Dorie Rank, NP [facility], [facility]  ? In 5 months  [facility], [facility]  ?  ? ?  ?  ?  ? ?

## 2022-03-01 ENCOUNTER — Telehealth: Payer: Medicare Other

## 2022-03-01 ENCOUNTER — Ambulatory Visit (INDEPENDENT_AMBULATORY_CARE_PROVIDER_SITE_OTHER): Payer: Medicare Other

## 2022-03-01 DIAGNOSIS — I152 Hypertension secondary to endocrine disorders: Secondary | ICD-10-CM

## 2022-03-01 DIAGNOSIS — M5432 Sciatica, left side: Secondary | ICD-10-CM | POA: Diagnosis not present

## 2022-03-01 DIAGNOSIS — M059 Rheumatoid arthritis with rheumatoid factor, unspecified: Secondary | ICD-10-CM

## 2022-03-01 DIAGNOSIS — M9905 Segmental and somatic dysfunction of pelvic region: Secondary | ICD-10-CM | POA: Diagnosis not present

## 2022-03-01 DIAGNOSIS — E1121 Type 2 diabetes mellitus with diabetic nephropathy: Secondary | ICD-10-CM

## 2022-03-01 DIAGNOSIS — M9903 Segmental and somatic dysfunction of lumbar region: Secondary | ICD-10-CM | POA: Diagnosis not present

## 2022-03-01 DIAGNOSIS — E1169 Type 2 diabetes mellitus with other specified complication: Secondary | ICD-10-CM

## 2022-03-01 DIAGNOSIS — M5416 Radiculopathy, lumbar region: Secondary | ICD-10-CM | POA: Diagnosis not present

## 2022-03-01 NOTE — Chronic Care Management (AMB) (Signed)
?Chronic Care Management  ? ?CCM RN Visit Note ? ?03/01/2022 ?Name: Kim Thomas MRN: [id number] DOB: 1956-08-22 ? ?Subjective: ?Kim Thomas is a 65 y.o. year old female who is a primary care patient of Cannady, [name], NP. The care management team was consulted for assistance with disease management and care coordination needs.   ? ?Engaged with patient by telephone for follow up visit in response to provider referral for case management and/or care coordination services.  ? ?Consent to Services:  ?The patient was given information about Chronic Care Management services, agreed to services, and gave verbal consent prior to initiation of services.  Please see initial visit note for detailed documentation.  ? ?Patient agreed to services and verbal consent obtained.  ? ?Assessment: Review of patient past medical history, allergies, medications, health status, including review of consultants reports, laboratory and other test data, was performed as part of comprehensive evaluation and provision of chronic care management services.  ? ?SDOH (Social Determinants of Health) assessments and interventions performed:   ? ?CCM Care Plan ? ?Allergies  ?Allergen Reactions  ? Morphine And Related Itching  ?  It burned, and made patient itch and go out of her mind.  ? Erythromycin   ?  "Been so long ago," patient unable to remember reaction  ? Hydrocodone-Acetaminophen Itching  ? ? ?Outpatient Encounter Medications as of 03/01/2022  ?Medication Sig Note  ? acetaminophen (TYLENOL) 500 MG tablet Take 500 mg by mouth daily as needed. 07/13/2020: Taking one tablet twice a day.  ? ascorbic acid (VITAMIN C) 1000 MG tablet Take by mouth.   ? Aspirin (VAZALORE) 81 MG CAPS Take by mouth.   ? atorvastatin (LIPITOR) 20 MG tablet TAKE 1 TABLET BY MOUTH  DAILY   ? Blood Glucose Monitoring Suppl (ONE TOUCH ULTRA 2) w/Device KIT USE TO CHECK BLOOD SUGAR 3  TO 4 TIMES DAILY AND  DOCUMENT FOR VISITS   ? CALCIUM CITRATE PO Take 1,000 mg by mouth  daily.   ? Cholecalciferol (VITAMIN D3) 2000 units TABS Take 2,000 Units by mouth daily.   ? Co-Enzyme Q-10 100 MG CAPS Take 50 mg by mouth daily.   ? diclofenac sodium (VOLTAREN) 1 % GEL Apply topically.   ? EQ ALLERGY RELIEF, CETIRIZINE, 10 MG tablet Take 1 tablet by mouth once daily   ? fluticasone (FLONASE) 50 MCG/ACT nasal spray USE 1 TO 2 SPRAYS IN BOTH  NOSTRILS TWICE DAILY   ? gabapentin (NEURONTIN) 400 MG capsule TAKE 1 CAPSULE BY MOUTH 3  TIMES DAILY   ? hydroxychloroquine (PLAQUENIL) 200 MG tablet Take 2 tablets (400 mg total) by mouth daily.   ? Lancets (ONETOUCH DELICA PLUS [id number]) MISC 1 each by Does not apply route 2 (two) times daily.   ? Magnesium 400 MG TABS Take by mouth.   ? Omega-3 1000 MG CAPS Take 1 capsule by mouth daily.   ? omeprazole (PRILOSEC) 20 MG capsule TAKE 1 CAPSULE BY MOUTH  DAILY   ? ONETOUCH ULTRA test strip USE TO CHECK BLOOD SUGAR  DAILY   ? OVER THE COUNTER MEDICATION Take 2 tablets by mouth daily. Alive Diabetic Multivitamin   ? Potassium 99 MG TABS Take by mouth.   ? Semaglutide, 1 MG/DOSE, 4 MG/3ML SOPN Inject 1 mg as directed once a week.   ? telmisartan (MICARDIS) 40 MG tablet Take 1 tablet (40 mg total) by mouth daily.   ? triamcinolone cream (KENALOG) 0.1 % Apply 1 application topically 2 (two) times daily.   ?  vitamin E 100 UNIT capsule Take 100 Units by mouth daily.   ? ?No facility-administered encounter medications on file as of 03/01/2022.  ? ? ?Patient Active Problem List  ? Diagnosis Date Noted  ? Seropositive rheumatoid arthritis (HCC) 01/20/2022  ? Chronic left-sided low back pain 08/09/2021  ? Obesity 07/17/2020  ? Osteopenia of neck of left femur 03/04/2020  ? Multinodular thyroid 01/02/2020  ? Vitamin D deficiency 12/03/2019  ? Hyperlipidemia associated with type 2 diabetes mellitus (HCC) 04/03/2018  ? Advanced care planning/counseling discussion 02/20/2017  ? Elevated uric acid in blood 06/21/2016  ? Allergic rhinitis 06/06/2016  ? CKD stage 3 due to type  2 diabetes mellitus (HCC) 06/06/2016  ? Type 2 diabetes mellitus with diabetic nephropathy (HCC) 05/25/2016  ? Hypertension associated with diabetes (HCC) 05/25/2016  ? Sleep apnea 05/25/2016  ? 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 ? ?Conditions to be addressed/monitored:HTN, HLD, DMII, and Chronic pain  ? ?Care Plan : RNCM: General Plan of Care (Adult) for Chronic Disease Management and Care Coordination Needs  ?Updates made by Tate, Pamela J, RN since 03/01/2022 12:00 AM  ?  ? ?Problem: RNCM: Development of Plan of Care for Chronic Disease Management (HTN, HLD, DM2, Chronic pain)   ?Priority: High  ?  ? ?Long-Range Goal: RNCM: Effective Management  of Plan of Care for Chronic Disease Management (HTN, HLD, DM2, chronic pain)   ?Start Date: 10/27/2021  ?Expected End Date: 10/27/2022  ?Priority: High  ?Note:   ?Current Barriers:  ?Knowledge Deficits related to plan of care for management of HTN, HLD, and DMII  ?Chronic Disease Management support and education needs related to HTN, HLD, and DMII ? ?RNCM Clinical Goal(s):  ?Patient will verbalize basic understanding of HTN, HLD, Chronic pain, and DMII disease process and self health management plan as evidenced by keeping appointments, following the plan of care, calling the office for changes or questions, and working with the CCM team to optimize health and well being  ?take all medications exactly as prescribed and will call provider for medication related questions as evidenced by compliance and calling for refills before running out of medications     ?attend all scheduled medical appointments: 05-11-2022 and 1040 am as evidenced by keeping appointments and calling the office for schedule change needs         ?demonstrate a decrease in HTN, HLD, chronic pain, and DMII exacerbations  as evidenced by stable conditions, and working with the pcp and CCM team to optimize health and well being  ?demonstrate ongoing self health care management ability to effectively manage chronic  conditions  as evidenced by working with the CCM team through collaboration with RN Care manager, provider, and care team.  ? ?Interventions: ?1:1 collaboration with primary care provider regarding development and update of comprehensive plan of care as evidenced by provider attestation and co-signature ?Inter-disciplinary care team collaboration (see longitudinal plan of care) ?Evaluation of current treatment plan related to  self management and patient's adherence to plan as established by provider ? ? ?Diabetes:  (Status: Goal on Track (progressing): YES.) Long Term Goal  ? ?Lab Results  ?Component Value Date  ? HGBA1C 5.7 (H) 02/07/2022  ?Assessed patient's understanding of A1c goal: <7% ?Provided education to patient about basic DM disease process; ?Reviewed medications with patient and discussed importance of medication adherence. 03-01-2022: Is compliant with medications. She states that she is monitoring her medications and doing well;        ?Reviewed prescribed diet with patient   heart healthy/ADA. 03-01-2022: The patient is compliant with heart healthy/ADA diet. Is mindful of dietary restrictions ; ?Counseled on importance of regular laboratory monitoring as prescribed;        ?Discussed plans with patient for ongoing care management follow up and provided patient with direct contact information for care management team;      ?Provided patient with written educational materials related to hypo and hyperglycemia and importance of correct treatment. 03-01-2022: The patient denies any lows. The patient states lowest has been at 100. She states mostly her blood sugars have been WNL;       ?Reviewed scheduled/upcoming provider appointments including: 05-11-2022 at 1040 am       ?Advised patient, providing education and rationale, to check cbg when you have symptoms of low or high blood sugar, before and after exercise, and as directed   and record. 03-01-2022: The patient is taking blood sugars and writing down the  values. The patient is pleased with her blood sugars and changes in DM management. She is happy that her A1C is at range, she is losing weight and her blood sugars are WNL.       ?call provider for findings o
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
## 2022-03-11 ENCOUNTER — Other Ambulatory Visit: Payer: Self-pay | Admitting: Nurse Practitioner

## 2022-03-14 NOTE — Telephone Encounter (Signed)
Requested Prescriptions  ?Pending Prescriptions Disp Refills  ?? atorvastatin (LIPITOR) 20 MG tablet [Pharmacy Med Name: Atorvastatin Calcium 20 MG Oral Tablet] 90 tablet 0  ?  Sig: TAKE 1 TABLET BY MOUTH  DAILY  ?  ? Cardiovascular:  Antilipid - Statins Failed - 03/11/2022 10:30 PM  ?  ?  Failed - Lipid Panel in normal range within the last 12 months  ?  Cholesterol, Total  ?Date Value Ref Range Status  ?02/07/2022 112 100 - 199 mg/dL Final  ? ?Cholesterol Piccolo, [place]  ?Date Value Ref Range Status  ?05/28/2019 150 <200 mg/dL Final  ?  Comment:  ?                          Desirable                <200 ?                        Borderline High      200- 239 ?                        High                     >239 ?  ? ?LDL Chol Calc (NIH)  ?Date Value Ref Range Status  ?02/07/2022 36 0 - 99 mg/dL Final  ? ?HDL  ?Date Value Ref Range Status  ?02/07/2022 56 >39 mg/dL Final  ? ?Triglycerides  ?Date Value Ref Range Status  ?02/07/2022 112 0 - 149 mg/dL Final  ? ?Triglycerides Piccolo,Waived  ?Date Value Ref Range Status  ?05/28/2019 92 <150 mg/dL Final  ?  Comment:  ?                          Normal                   <150 ?                        Borderline High     150 - 199 ?                        High                200 - 499 ?                        Very High                >499 ?  ? ?  ?  ?  Passed - Patient is not pregnant  ?  ?  Passed - Valid encounter within last 12 months  ?  Recent Outpatient Visits   ?      ? 1 month ago Type 2 diabetes mellitus with diabetic nephropathy, without long-term current use of insulin (HCC)  ? [facility] [place], [place] T, NP  ? 3 months ago Chronic inflammatory arthritis  ? [facility] [place], [place] T, NP  ? 4 months ago Type 2 diabetes mellitus with diabetic nephropathy, without long-term current use of insulin (HCC)  ? [facility] [place], [place] T, NP  ? 7 months ago Type 2 diabetes mellitus with diabetic nephropathy, without long-term  current use  of insulin (HCC)  ? [facility] [place], [place] T, NP  ? 8 months ago Acute left-sided low back pain with left-sided sciatica  ? [facility] [place], [place] P, DO  ?  ?  ?Future Appointments   ?        ? In 1 month Rice, Jamesetta Orleans, MD [facility]  ? In 1 month Cannady, Dorie Rank, NP [facility], [facility]  ? In 5 months  [facility], [facility]  ?  ? ?  ?  ?  ? ?

## 2022-03-18 ENCOUNTER — Ambulatory Visit
Admission: RE | Admit: 2022-03-18 | Discharge: 2022-03-18 | Disposition: A | Discharge status: Home / Self Care | Payer: Medicare Other | Source: Ambulatory Visit | Attending: Nurse Practitioner | Admitting: Nurse Practitioner

## 2022-03-18 DIAGNOSIS — Z1231 Encounter for screening mammogram for malignant neoplasm of breast: Secondary | ICD-10-CM | POA: Insufficient documentation

## 2022-03-18 NOTE — Progress Notes (Signed)
Contacted via MyChart   Normal mammogram, may repeat in one year:)

## 2022-03-27 DIAGNOSIS — E1159 Type 2 diabetes mellitus with other circulatory complications: Secondary | ICD-10-CM

## 2022-03-27 DIAGNOSIS — E785 Hyperlipidemia, unspecified: Secondary | ICD-10-CM

## 2022-03-27 DIAGNOSIS — I10 Essential (primary) hypertension: Secondary | ICD-10-CM

## 2022-03-29 DIAGNOSIS — M5432 Sciatica, left side: Secondary | ICD-10-CM | POA: Diagnosis not present

## 2022-03-29 DIAGNOSIS — M9903 Segmental and somatic dysfunction of lumbar region: Secondary | ICD-10-CM | POA: Diagnosis not present

## 2022-03-29 DIAGNOSIS — M5416 Radiculopathy, lumbar region: Secondary | ICD-10-CM | POA: Diagnosis not present

## 2022-03-29 DIAGNOSIS — M9905 Segmental and somatic dysfunction of pelvic region: Secondary | ICD-10-CM | POA: Diagnosis not present

## 2022-04-04 DIAGNOSIS — Z79899 Other long term (current) drug therapy: Secondary | ICD-10-CM | POA: Diagnosis not present

## 2022-04-04 DIAGNOSIS — H539 Unspecified visual disturbance: Secondary | ICD-10-CM | POA: Diagnosis not present

## 2022-04-04 DIAGNOSIS — H40013 Open angle with borderline findings, low risk, bilateral: Secondary | ICD-10-CM | POA: Diagnosis not present

## 2022-04-04 DIAGNOSIS — E119 Type 2 diabetes mellitus without complications: Secondary | ICD-10-CM | POA: Diagnosis not present

## 2022-04-04 LAB — HM DIABETES EYE EXAM
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
## 2022-04-18 ENCOUNTER — Encounter: Payer: Self-pay | Admitting: Internal Medicine

## 2022-04-18 ENCOUNTER — Ambulatory Visit (INDEPENDENT_AMBULATORY_CARE_PROVIDER_SITE_OTHER): Payer: Medicare Other | Admitting: Internal Medicine

## 2022-04-18 VITALS — BP 109/68 | HR 85 | Resp 15 | Ht 64.0 in | Wt 193.0 lb

## 2022-04-18 DIAGNOSIS — R278 Other lack of coordination: Secondary | ICD-10-CM | POA: Diagnosis not present

## 2022-04-18 DIAGNOSIS — Z79899 Other long term (current) drug therapy: Secondary | ICD-10-CM

## 2022-04-18 DIAGNOSIS — R768 Other specified abnormal immunological findings in serum: Secondary | ICD-10-CM

## 2022-04-18 DIAGNOSIS — M059 Rheumatoid arthritis with rheumatoid factor, unspecified: Secondary | ICD-10-CM | POA: Diagnosis not present

## 2022-04-18 DIAGNOSIS — M79641 Pain in right hand: Secondary | ICD-10-CM | POA: Diagnosis not present

## 2022-04-18 DIAGNOSIS — M79642 Pain in left hand: Secondary | ICD-10-CM

## 2022-04-18 MED ORDER — HYDROXYCHLOROQUINE SULFATE 200 MG PO TABS: 300.0000 mg | ORAL_TABLET | Freq: Every day | ORAL | 1 refills | Status: DC

## 2022-04-26 DIAGNOSIS — M5432 Sciatica, left side: Secondary | ICD-10-CM | POA: Diagnosis not present

## 2022-04-26 DIAGNOSIS — M5416 Radiculopathy, lumbar region: Secondary | ICD-10-CM | POA: Diagnosis not present

## 2022-04-26 DIAGNOSIS — M9903 Segmental and somatic dysfunction of lumbar region: Secondary | ICD-10-CM | POA: Diagnosis not present

## 2022-04-26 DIAGNOSIS — M9905 Segmental and somatic dysfunction of pelvic region: Secondary | ICD-10-CM | POA: Diagnosis not present

## 2022-05-04 DIAGNOSIS — M5416 Radiculopathy, lumbar region: Secondary | ICD-10-CM | POA: Diagnosis not present

## 2022-05-04 DIAGNOSIS — H539 Unspecified visual disturbance: Secondary | ICD-10-CM | POA: Diagnosis not present

## 2022-05-04 DIAGNOSIS — M9903 Segmental and somatic dysfunction of lumbar region: Secondary | ICD-10-CM | POA: Diagnosis not present

## 2022-05-04 DIAGNOSIS — Z79899 Other long term (current) drug therapy: Secondary | ICD-10-CM | POA: Diagnosis not present

## 2022-05-04 DIAGNOSIS — H40013 Open angle with borderline findings, low risk, bilateral: Secondary | ICD-10-CM | POA: Diagnosis not present

## 2022-05-04 DIAGNOSIS — M5432 Sciatica, left side: Secondary | ICD-10-CM | POA: Diagnosis not present

## 2022-05-04 DIAGNOSIS — M9905 Segmental and somatic dysfunction of pelvic region: Secondary | ICD-10-CM | POA: Diagnosis not present

## 2022-05-11 ENCOUNTER — Ambulatory Visit: Payer: Medicare Other | Admitting: Nurse Practitioner

## 2022-05-18 ENCOUNTER — Other Ambulatory Visit: Payer: Self-pay | Admitting: Nurse Practitioner

## 2022-05-18 DIAGNOSIS — E1159 Type 2 diabetes mellitus with other circulatory complications: Secondary | ICD-10-CM

## 2022-05-19 NOTE — Telephone Encounter (Signed)
Requested Prescriptions  Pending Prescriptions Disp Refills  . telmisartan (MICARDIS) 40 MG tablet [Pharmacy Med Name: Telmisartan 40 MG Oral Tablet] 90 tablet 1    Sig: TAKE 1 TABLET BY MOUTH  DAILY     Cardiovascular:  Angiotensin Receptor Blockers Failed - 05/18/2022 11:02 PM      Failed - Cr in normal range and within 180 days    Creatinine, Ser  Date Value Ref Range Status  02/07/2022 1.18 (H) 0.57 - 1.00 mg/dL Final         Passed - K in normal range and within 180 days    Potassium  Date Value Ref Range Status  02/07/2022 4.4 3.5 - 5.2 mmol/L Final         Passed - Patient is not pregnant      Passed - Last BP in normal range    BP Readings from Last 1 Encounters:  04/18/22 109/68         Passed - Valid encounter within last 6 months    Recent Outpatient Visits          3 months ago Type 2 diabetes mellitus with diabetic nephropathy, without long-term current use of insulin (HCC)   [facility] [place], [place] T, NP   5 months ago Chronic inflammatory arthritis   [facility] [place], [place] T, NP   6 months ago Type 2 diabetes mellitus with diabetic nephropathy, without long-term current use of insulin (HCC)   [facility] [place], Jolene T, NP   9 months ago Type 2 diabetes mellitus with diabetic nephropathy, without long-term current use of insulin (HCC)   [facility] [place], [place] T, NP   10 months ago Acute left-sided low back pain with left-sided sciatica   [facility] [place], Oralia Rud, DO      Future Appointments            In 1 week Cannady, Dorie Rank, NP [facility], [facility]   In 3 months  [facility], [facility]   In 5 months Rice, Jamesetta Orleans, MD [facility]

## 2022-05-24 ENCOUNTER — Telehealth: Payer: Medicare Other

## 2022-05-24 ENCOUNTER — Ambulatory Visit (INDEPENDENT_AMBULATORY_CARE_PROVIDER_SITE_OTHER): Payer: Medicare Other

## 2022-05-24 DIAGNOSIS — M79642 Pain in left hand: Secondary | ICD-10-CM

## 2022-05-24 DIAGNOSIS — E1159 Type 2 diabetes mellitus with other circulatory complications: Secondary | ICD-10-CM

## 2022-05-24 DIAGNOSIS — E1121 Type 2 diabetes mellitus with diabetic nephropathy: Secondary | ICD-10-CM

## 2022-05-24 DIAGNOSIS — E1169 Type 2 diabetes mellitus with other specified complication: Secondary | ICD-10-CM

## 2022-05-24 DIAGNOSIS — G8929 Other chronic pain: Secondary | ICD-10-CM

## 2022-05-27 DIAGNOSIS — I10 Essential (primary) hypertension: Secondary | ICD-10-CM

## 2022-05-27 DIAGNOSIS — E785 Hyperlipidemia, unspecified: Secondary | ICD-10-CM | POA: Diagnosis not present

## 2022-05-27 DIAGNOSIS — E1159 Type 2 diabetes mellitus with other circulatory complications: Secondary | ICD-10-CM | POA: Diagnosis not present

## 2022-05-29 NOTE — Patient Instructions (Signed)

## 2022-05-30 ENCOUNTER — Encounter: Payer: Self-pay | Admitting: Nurse Practitioner

## 2022-05-30 ENCOUNTER — Ambulatory Visit (INDEPENDENT_AMBULATORY_CARE_PROVIDER_SITE_OTHER): Payer: Medicare Other | Admitting: Nurse Practitioner

## 2022-05-30 ENCOUNTER — Other Ambulatory Visit: Payer: Self-pay | Admitting: Nurse Practitioner

## 2022-05-30 VITALS — BP 106/65 | HR 76 | Temp 98.0°F | Ht 64.0 in | Wt 190.4 lb

## 2022-05-30 DIAGNOSIS — I152 Hypertension secondary to endocrine disorders: Secondary | ICD-10-CM

## 2022-05-30 DIAGNOSIS — E6609 Other obesity due to excess calories: Secondary | ICD-10-CM

## 2022-05-30 DIAGNOSIS — G4733 Obstructive sleep apnea (adult) (pediatric): Secondary | ICD-10-CM

## 2022-05-30 DIAGNOSIS — E1121 Type 2 diabetes mellitus with diabetic nephropathy: Secondary | ICD-10-CM | POA: Diagnosis not present

## 2022-05-30 DIAGNOSIS — Z6832 Body mass index (BMI) 32.0-32.9, adult: Secondary | ICD-10-CM

## 2022-05-30 DIAGNOSIS — E785 Hyperlipidemia, unspecified: Secondary | ICD-10-CM | POA: Diagnosis not present

## 2022-05-30 DIAGNOSIS — N183 Chronic kidney disease, stage 3 unspecified: Secondary | ICD-10-CM | POA: Diagnosis not present

## 2022-05-30 DIAGNOSIS — E1169 Type 2 diabetes mellitus with other specified complication: Secondary | ICD-10-CM | POA: Diagnosis not present

## 2022-05-30 DIAGNOSIS — E1122 Type 2 diabetes mellitus with diabetic chronic kidney disease: Secondary | ICD-10-CM

## 2022-05-30 DIAGNOSIS — E1159 Type 2 diabetes mellitus with other circulatory complications: Secondary | ICD-10-CM | POA: Diagnosis not present

## 2022-05-30 DIAGNOSIS — M059 Rheumatoid arthritis with rheumatoid factor, unspecified: Secondary | ICD-10-CM

## 2022-05-30 DIAGNOSIS — E790 Hyperuricemia without signs of inflammatory arthritis and tophaceous disease: Secondary | ICD-10-CM | POA: Diagnosis not present

## 2022-05-30 LAB — BAYER DCA HB A1C WAIVED: HB A1C (BAYER DCA - WAIVED): 5.3 % (ref 4.8–5.6)

## 2022-05-30 MED ORDER — TELMISARTAN 20 MG PO TABS: 20.0000 mg | ORAL_TABLET | Freq: Every day | ORAL | 4 refills | Status: DC

## 2022-05-30 NOTE — Assessment & Plan Note (Signed)
Chronic, ongoing.  Continue Telmisartan for kidney protection.  Check BMP today.  Continue collaboration with nephrology, appreciate their input.

## 2022-05-30 NOTE — Assessment & Plan Note (Signed)
History of elevations, recheck today.  No current medications. 

## 2022-05-30 NOTE — Assessment & Plan Note (Signed)
Chronic, ongoing.  A1c 5.3% today, praised for ongoing success with A1c in normal range now.  Urine ALB 10 (March 2023), continue Telmisartan for kidney protection.  Continue Ozempic 1 MG weekly which is offering ongoing benefit to diabetes and weight.  Continue off Jardiance as is tolerating this. Continue increased diet focus and monitoring BS twice a day.  Recommend focus on healthy weight loss and exercise daily.  Return in 3 months.

## 2022-05-30 NOTE — Assessment & Plan Note (Signed)
Not using CPAP at this time, reports no symptoms.  Consider repeat sleep study in future and restarting.   

## 2022-05-30 NOTE — Assessment & Plan Note (Addendum)
Diagnosed on 01/20/22, continue collaboration with rheumatology and Plaquenil as ordered by them, she is noticing improved symptoms.  Recheck ESR today per recommendations on recent review of notes.

## 2022-05-30 NOTE — Assessment & Plan Note (Signed)
Chronic, stable with BP well below goal today.  Will reduce Telmisartan today to 20 MG, discussed at length with patient.  Educated her on Telmisartan and kidney benefit.  Recommend she check BP at home three mornings a week and document for provider.  Urine micro alb 10 and A:C <30, March 2023.  Focus on DASH diet at home.  Return in 3 months -- she prefers to return then vs 4 weeks and will monitor BP at home, she will alert provider if any elevations >130/80.

## 2022-05-30 NOTE — Progress Notes (Signed)
BP 106/65   Pulse 76   Temp 98 F (36.7 C) (Oral)   Ht '5\' 4"'  (1.626 m)   Wt 190 lb 6.4 oz (86.4 kg)   LMP  (LMP Unknown)   SpO2 97%   BMI 32.68 kg/m    Subjective:    Patient ID: Kim Thomas, female    DOB: [birth year], 66 y.o.   MRN: [id number]  HPI: Kim Thomas is a 66 y.o. female  Chief Complaint  Patient presents with   Diabetes   Hyperlipidemia   Hypertension   Rheumatoid Arthritis   Gastroesophageal Reflux   DIABETES Last A1c in March was 5.7%.  Continues on Ozempic 1 MG weekly.  Stopped Jardiance back on 11/19/21 due to improving A1c.  Continues to maintain weight loss.   Taking Gabapentin 400 MG BID for neuropathy, if bad day will take the third dose. Hypoglycemic episodes: no Polydipsia/polyuria: no Visual disturbance: no Chest pain: no Paresthesias: no Glucose Monitoring: yes             Accucheck frequency: occasionally             Fasting glucose: 109 to 115 range after coffee             Post prandial:             Evening:              Before meals:  Taking Insulin?: no             Long acting insulin:             Short acting insulin: Blood Pressure Monitoring: not checking Retinal Examination: Up To Date Foot Exam: Up to Date Pneumovax: Up To Date Influenza: refuses Aspirin: yes  HYPERTENSION / HYPERLIPIDEMIA Continues on Atorvastatin 20 MG daily and Telmisartan 40 MG.  Is not using CPAP at night, has stopped using as does not know if she needs it since weight loss.  She denies any fatigue during day or headaches. Satisfied with current treatment? yes Duration of hypertension: chronic BP monitoring frequency: not checking BP range:  BP medication side effects: no Duration of hyperlipidemia: chronic Cholesterol medication side effects: no Cholesterol supplements: none Medication compliance: good compliance Aspirin: yes Recent stressors: no Recurrent headaches: no Visual changes: no Palpitations: no Dyspnea: no Chest pain: no Lower  extremity edema: no Dizzy/lightheaded: no The ASCVD Risk score (Arnett DK, et al., 2019) failed to calculate for the following reasons:   The valid total cholesterol range is 130 to 320 mg/dL    RHEUMATOID ARTHRITIS Had initial visit with rheumatology on 04/18/22 and continues Plaquenil -- he would like ESR checked in office today.  Reports ongoing improvement in pain.  History of elevation in uric acid levels, no current medications. Duration: months Pain: no Context:  better Decreased function/range of motion: improved Erythema: none Swelling: improved Heat or warmth: none Morning stiffness: improved Aggravating factors: none Alleviating factors: Plaquenil Relief with NSAIDs?: No NSAIDs Taken Treatments attempted:  Parafin wax, Tylenol, Plaquenil  Involved Joints:     Hands: yes bilateral    Wrists: none    Elbows: none    Shoulders: none    Back: yes     Hips: none    Knees: none    Ankles: none    Feet: none   CHRONIC KIDNEY DISEASE Saw nephrology, Dr. Candiss Norse, last visit on 08/26/21. Continues on sodium bicarbonate per recommendation of nephrology.   CKD status: stable Medications renally dose: yes  Previous renal evaluation: yes Pneumovax:  Up To Date Influenza Vaccine:  Refuses   GERD Continues on Omeprazole daily, before breakfast.  We discussed reducing this.  Has had a lot of diet changes recently. GERD control status: stable Satisfied with current treatment? yes Heartburn frequency: occasional with tomato sauce Medication side effects: no  Medication compliance: stable Dysphagia: no Odynophagia:  no Hematemesis: no Blood in stool: no EGD: yes   Relevant past medical, surgical, family and social history reviewed and updated as indicated. Interim medical history since our last visit reviewed. Allergies and medications reviewed and updated.  Review of Systems  Constitutional:  Negative for activity change, appetite change, diaphoresis, fatigue and fever.   Respiratory:  Negative for cough, chest tightness and shortness of breath.   Cardiovascular:  Negative for chest pain, palpitations and leg swelling.  Gastrointestinal: Negative.   Endocrine: Negative for cold intolerance, heat intolerance, polydipsia, polyphagia and polyuria.  Neurological: Negative.   Psychiatric/Behavioral: Negative.      Per HPI unless specifically indicated above     Objective:    BP 106/65   Pulse 76   Temp 98 F (36.7 C) (Oral)   Ht '5\' 4"'  (1.626 m)   Wt 190 lb 6.4 oz (86.4 kg)   LMP  (LMP Unknown)   SpO2 97%   BMI 32.68 kg/m   Wt Readings from Last 3 Encounters:  05/30/22 190 lb 6.4 oz (86.4 kg)  04/18/22 193 lb (87.5 kg)  02/07/22 192 lb (87.1 kg)    Physical Exam Vitals and nursing note reviewed.  Constitutional:      General: She is awake. She is not in acute distress.    Appearance: She is well-developed. She is obese. She is not ill-appearing.  HENT:     Head: Normocephalic.     Right Ear: Hearing normal. No drainage.     Left Ear: Hearing normal. No drainage.  Eyes:     General: Lids are normal.        Right eye: No discharge.        Left eye: No discharge.     Conjunctiva/sclera: Conjunctivae normal.     Pupils: Pupils are equal, round, and reactive to light.  Neck:     Vascular: No carotid bruit.  Cardiovascular:     Rate and Rhythm: Normal rate and regular rhythm.     Heart sounds: Normal heart sounds. No murmur heard.    No gallop.  Pulmonary:     Effort: Pulmonary effort is normal. No accessory muscle usage or respiratory distress.     Breath sounds: Normal breath sounds.  Abdominal:     General: Bowel sounds are normal.     Palpations: Abdomen is soft.  Musculoskeletal:     Cervical back: Normal range of motion and neck supple.     Right lower leg: No edema.     Left lower leg: No edema.  Skin:    General: Skin is warm and dry.  Neurological:     Mental Status: She is alert and oriented to person, place, and time.   Psychiatric:        Attention and Perception: Attention normal.        Mood and Affect: Mood normal.        Speech: Speech normal.        Behavior: Behavior normal. Behavior is cooperative.        Thought Content: Thought content normal.    Diabetic Foot Exam - Simple  Simple Foot Form Visual Inspection No deformities, no ulcerations, no other skin breakdown bilaterally: Yes Sensation Testing Intact to touch and monofilament testing bilaterally: Yes Pulse Check Posterior Tibialis and Dorsalis pulse intact bilaterally: Yes Comments     Results for orders placed or performed in visit on 05/30/22  Bayer DCA Hb A1c Waived  Result Value Ref Range   HB A1C (BAYER DCA - WAIVED) 5.3 4.8 - 5.6 %      Assessment & Plan:   Problem List Items Addressed This Visit       Cardiovascular and Mediastinum   Hypertension associated with diabetes ([place])    Chronic, stable with BP well below goal today.  Will reduce Telmisartan today to 20 MG, discussed at length with patient.  Educated her on Telmisartan and kidney benefit.  Recommend she check BP at home three mornings a week and document for provider.  Urine micro alb 10 and A:C <30, March 2023.  Focus on DASH diet at home.  Return in 3 months -- she prefers to return then vs 4 weeks and will monitor BP at home, she will alert provider if any elevations >130/80.      Relevant Medications   telmisartan (MICARDIS) 20 MG tablet   Other Relevant Orders   Basic metabolic panel   Bayer DCA Hb A1c Waived (Completed)     Respiratory   Sleep apnea    Not using CPAP at this time, reports no symptoms.  Consider repeat sleep study in future and restarting.          Endocrine   CKD stage 3 due to type 2 diabetes mellitus (HCC)    Chronic, ongoing.  Continue Telmisartan for kidney protection.  Check BMP today.  Continue collaboration with nephrology, appreciate their input.      Relevant Medications   telmisartan (MICARDIS) 20 MG tablet    Other Relevant Orders   Basic metabolic panel   Bayer DCA Hb A1c Waived (Completed)   Hyperlipidemia associated with type 2 diabetes mellitus (HCC)    Chronic, ongoing.  Continue current medication regimen and adjust as needed.  Lipid panel up to date.      Relevant Medications   telmisartan (MICARDIS) 20 MG tablet   Other Relevant Orders   Bayer DCA Hb A1c Waived (Completed)   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  A1c 5.3% today, praised for ongoing success with A1c in normal range now.  Urine ALB 10 (March 2023), continue Telmisartan for kidney protection.  Continue Ozempic 1 MG weekly which is offering ongoing benefit to diabetes and weight.  Continue off Jardiance as is tolerating this. Continue increased diet focus and monitoring BS twice a day.  Recommend focus on healthy weight loss and exercise daily.  Return in 3 months.      Relevant Medications   telmisartan (MICARDIS) 20 MG tablet   Other Relevant Orders   Bayer DCA Hb A1c Waived (Completed)     Musculoskeletal and Integument   Seropositive rheumatoid arthritis ([place])    Diagnosed on 01/20/22, continue collaboration with rheumatology and Plaquenil as ordered by them, she is noticing improved symptoms.  Recheck ESR today per recommendations on recent review of notes.      Relevant Orders   Sed Rate (ESR)     Other   Elevated uric acid in blood    History of elevations, recheck today.  No current medications.      Relevant Orders  Uric acid   Obesity    BMI 32.68 with T2DM, HTN/HLD.  Recommended eating smaller high protein, low fat meals more frequently and exercising 30 mins a day 5 times a week with a goal of 10-15lb weight loss in the next 3 months. Patient voiced their understanding and motivation to adhere to these recommendations.         Follow up plan: Return in about 3 months (around 08/30/2022) for T2DM, HTN/HLD, RHEUMATOID, CKD.

## 2022-05-30 NOTE — Assessment & Plan Note (Signed)
BMI 32.68 with T2DM, HTN/HLD.  Recommended eating smaller high protein, low fat meals more frequently and exercising 30 mins a day 5 times a week with a goal of 10-15lb weight loss in the next 3 months. Patient voiced their understanding and motivation to adhere to these recommendations.

## 2022-05-30 NOTE — Assessment & Plan Note (Signed)
Chronic, ongoing.  Continue current medication regimen and adjust as needed.  Lipid panel up to date. 

## 2022-05-31 ENCOUNTER — Other Ambulatory Visit: Payer: Self-pay | Admitting: Nurse Practitioner

## 2022-05-31 DIAGNOSIS — E790 Hyperuricemia without signs of inflammatory arthritis and tophaceous disease: Secondary | ICD-10-CM

## 2022-05-31 LAB — BASIC METABOLIC PANEL
BUN/Creatinine Ratio: 14 (ref 12–28)
BUN: 18 mg/dL (ref 8–27)
CO2: 19 mmol/L — ABNORMAL LOW (ref 20–29)
Calcium: 9.5 mg/dL (ref 8.7–10.3)
Chloride: 106 mmol/L (ref 96–106)
Creatinine, Ser: 1.33 mg/dL — ABNORMAL HIGH (ref 0.57–1.00)
Glucose: 94 mg/dL (ref 70–99)
Potassium: 4.4 mmol/L (ref 3.5–5.2)
Sodium: 140 mmol/L (ref 134–144)
eGFR: 44 mL/min/{1.73_m2} — ABNORMAL LOW (ref >59)

## 2022-05-31 LAB — SEDIMENTATION RATE: Sed Rate: 15 mm/hr (ref 0–40)

## 2022-05-31 LAB — URIC ACID: Uric Acid: 7.8 mg/dL — ABNORMAL HIGH (ref 3.0–7.2)

## 2022-05-31 NOTE — Progress Notes (Signed)
Contacted via MyChart   Good evening Dezeray, your labs have returned.  Kidney function, creatinine and eGFR, continues to show some kidney disease stage 3a.  Ensure to drink plenty of water daily.  Uric acid level a little high, which is concerning for gout flare.  Recommend we recheck this in one week on outpatient labs.  Make diet changes, avoid sea food, red meat, organ meat (liver), and asparagus + spinach.  If levels remain high I may add a medication to lower uric acid as this can cause joint pain.  ESR is normal and I will send to Dr. Benjamine Mola.  Any questions? Keep being amazing!!  Thank you for allowing me to participate in your care.  I appreciate you. Kindest regards, Phil Corti

## 2022-06-01 NOTE — Telephone Encounter (Signed)
Requested Prescriptions  Pending Prescriptions Disp Refills  . atorvastatin (LIPITOR) 20 MG tablet [Pharmacy Med Name: Atorvastatin Calcium 20 MG Oral Tablet] 90 tablet 0    Sig: TAKE 1 TABLET BY MOUTH ONCE  DAILY     Cardiovascular:  Antilipid - Statins Failed - 05/30/2022 10:47 PM      Failed - Lipid Panel in normal range within the last 12 months    Cholesterol, Total  Date Value Ref Range Status  02/07/2022 112 100 - 199 mg/dL Final   Cholesterol Piccolo, Waived  Date Value Ref Range Status  05/28/2019 150 <200 mg/dL Final    Comment:                            Desirable                <200                         Borderline High      200- 239                         High                     >239    LDL Chol Calc (NIH)  Date Value Ref Range Status  02/07/2022 36 0 - 99 mg/dL Final   HDL  Date Value Ref Range Status  02/07/2022 56 >39 mg/dL Final   Triglycerides  Date Value Ref Range Status  02/07/2022 112 0 - 149 mg/dL Final   Triglycerides Piccolo,Waived  Date Value Ref Range Status  05/28/2019 92 <150 mg/dL Final    Comment:                            Normal                   <150                         Borderline High     150 - 199                         High                200 - 499                         Very High                >499          Passed - Patient is not pregnant      Passed - Valid encounter within last 12 months    Recent Outpatient Visits          2 days ago Type 2 diabetes mellitus with diabetic nephropathy, without long-term current use of insulin (HCC)   [facility] [place], Jolene T, NP   3 months ago Type 2 diabetes mellitus with diabetic nephropathy, without long-term current use of insulin (HCC)   [facility] [place], Or[place] T, NP   5 months ago Chronic inflammatory arthritis   [facility] [place], [place] T, NP   6 months ago Type 2 diabetes mellitus with diabetic nephropathy, without  long-term current  use of insulin (HCC)   [facility] [place], Triumph T, NP   9 months ago Type 2 diabetes mellitus with diabetic nephropathy, without long-term current use of insulin (HCC)   [facility] [place], Dorie Rank, NP      Future Appointments            In 2 months  [facility], [facility]   In 3 months Cannady, Dorie Rank, NP [facility], [facility]   In 4 months Rice, Jamesetta Orleans, MD [facility]

## 2022-06-01 NOTE — Progress Notes (Signed)
I am glad this still looks okay after dose adjustment for her RA medication. I wonder if the uric acid is slightly higher related to her GFR variation or could be just more diet related? Thanks for the update!

## 2022-06-03 ENCOUNTER — Encounter: Payer: Self-pay | Admitting: Nurse Practitioner

## 2022-06-03 NOTE — Telephone Encounter (Signed)
Patient scheduled for lab appt 7/14

## 2022-06-06 DIAGNOSIS — M9903 Segmental and somatic dysfunction of lumbar region: Secondary | ICD-10-CM | POA: Diagnosis not present

## 2022-06-06 DIAGNOSIS — M9905 Segmental and somatic dysfunction of pelvic region: Secondary | ICD-10-CM | POA: Diagnosis not present

## 2022-06-06 DIAGNOSIS — M5416 Radiculopathy, lumbar region: Secondary | ICD-10-CM | POA: Diagnosis not present

## 2022-06-06 DIAGNOSIS — M5432 Sciatica, left side: Secondary | ICD-10-CM | POA: Diagnosis not present

## 2022-06-10 ENCOUNTER — Other Ambulatory Visit: Payer: Medicare Other

## 2022-06-10 DIAGNOSIS — E790 Hyperuricemia without signs of inflammatory arthritis and tophaceous disease: Secondary | ICD-10-CM

## 2022-06-11 LAB — BASIC METABOLIC PANEL
BUN/Creatinine Ratio: 14 (ref 12–28)
BUN: 18 mg/dL (ref 8–27)
CO2: 17 mmol/L — ABNORMAL LOW (ref 20–29)
Calcium: 9.7 mg/dL (ref 8.7–10.3)
Chloride: 99 mmol/L (ref 96–106)
Creatinine, Ser: 1.28 mg/dL — ABNORMAL HIGH (ref 0.57–1.00)
Glucose: 77 mg/dL (ref 70–99)
Sodium: 138 mmol/L (ref 134–144)
eGFR: 46 mL/min/{1.73_m2} — ABNORMAL LOW (ref >59)

## 2022-06-11 LAB — URIC ACID: Uric Acid: 6.5 mg/dL (ref 3.0–7.2)

## 2022-06-11 NOTE — Progress Notes (Signed)
Contacted via MyChart   Good morning Kim Thomas, your pecan pies are as always delicious!!  Thank you!!  Kidney function continues to show stable kidney disease with no worsening.  Uric acid level has trended down.  Any questions? Keep being amazing!!  Thank you for allowing me to participate in your care.  I appreciate you. Kindest regards, Matalyn Nawaz

## 2022-06-20 ENCOUNTER — Other Ambulatory Visit: Payer: Self-pay | Admitting: Nurse Practitioner

## 2022-06-22 NOTE — Telephone Encounter (Signed)
Requested Prescriptions  Pending Prescriptions Disp Refills  . gabapentin (NEURONTIN) 400 MG capsule [Pharmacy Med Name: Gabapentin 400 MG Oral Capsule] 270 capsule 1    Sig: TAKE 1 CAPSULE BY MOUTH 3 TIMES  DAILY     Neurology: Anticonvulsants - gabapentin Failed - 06/20/2022 10:04 PM      Failed - Cr in normal range and within 360 days    Creatinine, Ser  Date Value Ref Range Status  06/10/2022 1.28 (H) 0.57 - 1.00 mg/dL Final         Passed - Completed PHQ-2 or PHQ-9 in the last 360 days      Passed - Valid encounter within last 12 months    Recent Outpatient Visits          3 weeks ago Type 2 diabetes mellitus with diabetic nephropathy, without long-term current use of insulin (HCC)   [facility] [place], [place] T, NP   4 months ago Type 2 diabetes mellitus with diabetic nephropathy, without long-term current use of insulin (HCC)   [facility] [place], [place] T, NP   6 months ago Chronic inflammatory arthritis   [facility] [place], [place] T, NP   7 months ago Type 2 diabetes mellitus with diabetic nephropathy, without long-term current use of insulin (HCC)   [facility] Cannady, Jolene T, NP   10 months ago Type 2 diabetes mellitus with diabetic nephropathy, without long-term current use of insulin (HCC)   [facility] [place], Dorie Rank, NP      Future Appointments            In 1 month  [facility]   In 2 months Cannady, Dorie Rank, NP [facility], [facility]   In 3 months Rice, Jamesetta Orleans, MD [facility]

## 2022-06-26 ENCOUNTER — Other Ambulatory Visit: Payer: Self-pay | Admitting: Internal Medicine

## 2022-06-26 DIAGNOSIS — M79641 Pain in right hand: Secondary | ICD-10-CM

## 2022-06-26 DIAGNOSIS — M059 Rheumatoid arthritis with rheumatoid factor, unspecified: Secondary | ICD-10-CM

## 2022-06-26 DIAGNOSIS — R768 Other specified abnormal immunological findings in serum: Secondary | ICD-10-CM

## 2022-06-27 NOTE — Telephone Encounter (Signed)
Next Visit: 10/19/2022  Last Visit: 04/18/2022  Labs: 06/10/2022,   Eye exam:  05/04/2022  Current Dose per office note 04/18/2022:  hydroxychloroquine (PLAQUENIL) 200 MG tablet       Sig: Take 1.5 tablets (300 mg total) by mouth daily.      Dispense:  135 tablet      Refill:  1    DX:  Seropositive rheumatoid arthritis   Last Fill: 04/18/2022  Okay to refill Plaquenil?

## 2022-07-04 DIAGNOSIS — M5416 Radiculopathy, lumbar region: Secondary | ICD-10-CM | POA: Diagnosis not present

## 2022-07-04 DIAGNOSIS — M9903 Segmental and somatic dysfunction of lumbar region: Secondary | ICD-10-CM | POA: Diagnosis not present

## 2022-07-04 DIAGNOSIS — M5432 Sciatica, left side: Secondary | ICD-10-CM | POA: Diagnosis not present

## 2022-07-04 DIAGNOSIS — M9905 Segmental and somatic dysfunction of pelvic region: Secondary | ICD-10-CM | POA: Diagnosis not present

## 2022-08-02 DIAGNOSIS — M9903 Segmental and somatic dysfunction of lumbar region: Secondary | ICD-10-CM | POA: Diagnosis not present

## 2022-08-02 DIAGNOSIS — M5416 Radiculopathy, lumbar region: Secondary | ICD-10-CM | POA: Diagnosis not present

## 2022-08-02 DIAGNOSIS — M9905 Segmental and somatic dysfunction of pelvic region: Secondary | ICD-10-CM | POA: Diagnosis not present

## 2022-08-02 DIAGNOSIS — M5432 Sciatica, left side: Secondary | ICD-10-CM | POA: Diagnosis not present

## 2022-08-10 DIAGNOSIS — E872 Acidosis, unspecified: Secondary | ICD-10-CM | POA: Diagnosis not present

## 2022-08-10 DIAGNOSIS — N1831 Chronic kidney disease, stage 3a: Secondary | ICD-10-CM | POA: Diagnosis not present

## 2022-08-10 DIAGNOSIS — I10 Essential (primary) hypertension: Secondary | ICD-10-CM | POA: Diagnosis not present

## 2022-08-10 DIAGNOSIS — E1122 Type 2 diabetes mellitus with diabetic chronic kidney disease: Secondary | ICD-10-CM | POA: Diagnosis not present

## 2022-08-18 ENCOUNTER — Ambulatory Visit (INDEPENDENT_AMBULATORY_CARE_PROVIDER_SITE_OTHER): Payer: Medicare Other | Admitting: *Deleted

## 2022-08-18 DIAGNOSIS — Z0000 Encounter for general adult medical examination without abnormal findings: Secondary | ICD-10-CM | POA: Diagnosis not present

## 2022-08-18 DIAGNOSIS — I10 Essential (primary) hypertension: Secondary | ICD-10-CM | POA: Diagnosis not present

## 2022-08-18 DIAGNOSIS — N1831 Chronic kidney disease, stage 3a: Secondary | ICD-10-CM | POA: Diagnosis not present

## 2022-08-18 DIAGNOSIS — E1122 Type 2 diabetes mellitus with diabetic chronic kidney disease: Secondary | ICD-10-CM | POA: Diagnosis not present

## 2022-08-18 NOTE — Progress Notes (Signed)
Subjective:   Kim Thomas is a 66 y.o. female who presents for Medicare Annual (Subsequent) preventive examination.  I connected with  [name] Overall on 08/18/22 by a telephone enabled telemedicine application and verified that I am speaking with the correct person using two identifiers.   I discussed the limitations of evaluation and management by telemedicine. The patient expressed understanding and agreed to proceed.  Patient location: home  Provider location: Tele-health-home    Review of Systems     Cardiac Risk Factors include: diabetes mellitus;advanced age (>73mn, >>95women);hypertension;family history of premature cardiovascular disease;obesity (BMI >30kg/m2)     Objective:    Today's Vitals   There is no height or weight on file to calculate BMI.     08/16/2021   10:32 AM 08/14/2020   10:45 AM 08/14/2019    9:57 AM 01/03/2019    9:08 AM 12/13/2018   10:17 AM 09/13/2017    1:33 PM 05/24/2017    1:56 PM  Advanced Directives  Does Patient Have a Medical Advance Directive? Yes Yes Yes Yes Yes Yes No  Type of AParamedicof APine GlenLiving will HWest DeLandLiving will Living will;Healthcare Power of [place]ArdsleyLiving will [place]villeLiving will HShady DaleLiving will   Does patient want to make changes to medical advance directive?     No - Patient declined    Copy of [place] Chart? No - copy requested No - copy requested No - copy requested  No - copy requested      Current Medications (verified) Outpatient Encounter Medications as of 08/18/2022  Medication Sig   acetaminophen (TYLENOL) 500 MG tablet Take 500 mg by mouth daily as needed.   ascorbic acid (VITAMIN C) 1000 MG tablet Take by mouth.   Aspirin (VAZALORE) 81 MG CAPS Take by mouth.   atorvastatin (LIPITOR) 20 MG tablet TAKE 1 TABLET BY MOUTH ONCE  DAILY   Blood Glucose Monitoring  Suppl (ONE TOUCH ULTRA 2) w/Device KIT USE TO CHECK BLOOD SUGAR 3  TO 4 TIMES DAILY AND  DOCUMENT FOR VISITS   CALCIUM CITRATE PO Take 1,000 mg by mouth daily.   Cholecalciferol (VITAMIN D3) 2000 units TABS Take 2,000 Units by mouth daily.   Co-Enzyme Q-10 100 MG CAPS Take 50 mg by mouth daily.   diclofenac sodium (VOLTAREN) 1 % GEL Apply topically.   EQ ALLERGY RELIEF, CETIRIZINE, 10 MG tablet Take 1 tablet by mouth once daily   fluticasone (FLONASE) 50 MCG/ACT nasal spray USE 1 TO 2 SPRAYS IN BOTH  NOSTRILS TWICE DAILY   gabapentin (NEURONTIN) 400 MG capsule TAKE 1 CAPSULE BY MOUTH 3 TIMES  DAILY   hydroxychloroquine (PLAQUENIL) 200 MG tablet TAKE 1 AND 1/2 TABLETS BY MOUTH  DAILY   Lancets (ONETOUCH DELICA PLUS [id number] MISC 1 each by Does not apply route 2 (two) times daily.   Magnesium 400 MG TABS Take by mouth.   Omega-3 1000 MG CAPS Take 1 capsule by mouth daily.   omeprazole (PRILOSEC) 20 MG capsule TAKE 1 CAPSULE BY MOUTH  DAILY   ONETOUCH ULTRA test strip USE TO CHECK BLOOD SUGAR  DAILY   OVER THE COUNTER MEDICATION Take 2 tablets by mouth daily. Alive Diabetic Multivitamin   Potassium 99 MG TABS Take by mouth.   Semaglutide, 1 MG/DOSE, 4 MG/3ML SOPN Inject 1 mg as directed once a week.   telmisartan (MICARDIS) 20 MG tablet Take 1 tablet (20  mg total) by mouth daily.   triamcinolone cream (KENALOG) 0.1 % Apply 1 application topically 2 (two) times daily.   vitamin E 100 UNIT capsule Take 100 Units by mouth daily.   No facility-administered encounter medications on file as of 08/18/2022.    Allergies (verified) Morphine and related, Erythromycin, and Hydrocodone-acetaminophen   History: Past Medical History:  Diagnosis Date   Anxiety    Chronic kidney disease    DDD (degenerative disc disease), cervical    Diabetes mellitus without complication (HCC)    Gout    Hypertension    Oxygen deficiency    Seropositive rheumatoid arthritis ([place])    Sleep apnea    Past  Surgical History:  Procedure Laterality Date   BREAST BIOPSY Left    benign   BREAST CYST EXCISION Left    c section     x2   CESAREAN SECTION     CHOLECYSTECTOMY     EYE SURGERY Bilateral 2019   catracts Dr.Hecter in [place]    FOOT SURGERY Bilateral    KNEE ARTHROSCOPY Left 07/18/2016   Procedure: ARTHROSCOPY KNEE;  Surgeon: Dereck Leep, MD;  Location: [facility] ORS;  Service: Orthopedics;  Laterality: Left;   KNEE ARTHROSCOPY WITH LATERAL MENISECTOMY Left 07/18/2016   Procedure: KNEE ARTHROSCOPY WITH PARTIAL  LATERAL MENISECTOMY, CHONDROPLASTY LATERAL FEMORAL CONDYLE;  Surgeon: Dereck Leep, MD;  Location: [facility] OR[facility];  Service: Orthopedics;  Laterality: Left;   SPINE SURGERY  11/09/2011   C5   TUBAL LIGATION     Family History  Problem Relation Age of Onset   Cancer Mother        breast   Hypertension Mother    Hyperlipidemia Mother    Diabetes Mother    Glaucoma Mother    Cataracts Mother    Breast cancer Mother 45   Cancer Father    Cancer Maternal Grandfather    Breast cancer Maternal Aunt    Breast cancer Cousin    Alcohol abuse Neg Hx    Social History   Socioeconomic History   Marital status: Single    Spouse name: Not on file   Number of children: Not on file   Years of education: Not on file   Highest education level: Bachelor's degree (e.g., BA, AB, BS)  Occupational History   Occupation: retired  Tobacco Use   Smoking status: Former    Packs/day: 0.25    Types: Cigarettes    Quit date: 05/21/2011    Years since quitting: 11.2   Smokeless tobacco: Never  Vaping Use   Vaping Use: Never used  Substance and Sexual Activity   Alcohol use: No    Alcohol/week: 0.0 standard drinks of alcohol   Drug use: No   Sexual activity: Not Currently  Other Topics Concern   Not on file  Social History Narrative   Not on file   Social Determinants of Health   Financial Resource Strain: Low Risk  (08/18/2022)   Overall Financial Resource Strain (CARDIA)     Difficulty of Paying Living Expenses: Not hard at all  Food Insecurity: No Food Insecurity (08/18/2022)   Hunger Vital Sign    Worried About Running Out of Food in the Last Year: Never true    Ran Out of Food in the Last Year: Never true  Transportation Needs: No Transportation Needs (08/18/2022)   PRAPARE - Hydrologist (Medical): No    Lack of Transportation (Non-Medical): No  Physical Activity: Insufficiently  Active (08/18/2021)   Exercise Vital Sign    Days of Exercise per Week: 3 days    Minutes of Exercise per Session: 40 min  Stress: No Stress Concern Present (08/18/2022)   [place]    Feeling of Stress : Not at all  Social Connections: Moderately Isolated (08/18/2022)   Social Connection and Isolation Panel [NHANES]    Frequency of Communication with Friends and Family: More than three times a week    Frequency of Social Gatherings with Friends and Family: Twice a week    Attends Religious Services: More than 4 times per year    Active Member of [employer] or Organizations: No    Attends Music therapist: Never    Marital Status: Never married    Tobacco Counseling Counseling given: Not Answered   Clinical Intake:  Pre-visit preparation completed: Yes  Pain : No/denies pain     Diabetes: Yes CBG done?: No Did pt. bring in CBG monitor from home?: No  How often do you need to have someone help you when you read instructions, pamphlets, or other written materials from your doctor or pharmacy?: 1 - Never  Diabetic?  Yes  Nutrition Risk Assessment:  Has the patient had any N/V/D within the last 2 months?  No  Does the patient have any non-healing wounds?  No  Has the patient had any unintentional weight loss or weight gain?  No   Diabetes:  Is the patient diabetic?  Yes  If diabetic, was a CBG obtained today?  No  Did the patient bring in their glucometer from  home?  No  How often do you monitor your CBG's? 2 x a week .   Financial Strains and Diabetes Management:  Are you having any financial strains with the device, your supplies or your medication? No .  Does the patient want to be seen by Chronic Care Management for management of their diabetes?  No  Would the patient like to be referred to a Nutritionist or for Diabetic Management?  No   Diabetic Exams:  Diabetic Eye Exam:  Pt has been advised about the importance in completing this exam.  Diabetic Foot Exam: Pt has been advised about the importance in completing this exam..   Interpreter Needed?: No  Information entered by :: Leroy Kennedy LPN   Activities of Daily Living    08/18/2022   10:42 AM 11/09/2021    8:49 AM  In your present state of health, do you have any difficulty performing the following activities:  Hearing? 0 0  Vision? 0 0  Difficulty concentrating or making decisions? 0 0  Walking or climbing stairs? 1 0  Dressing or bathing? 0 0  Doing errands, shopping? 0 0  Preparing Food and eating ? N   Using the Toilet? N   In the past six months, have you accidently leaked urine? N   Do you have problems with loss of bowel control? N   Managing your Medications? N   Managing your Finances? N   Housekeeping or managing your Housekeeping? N     Patient Care Team: Venita Lick, NP as PCP - General (Nurse Practitioner) Anabel Bene, MD as Referring Physician (Neurology) Watt Climes, PA as Physician Assistant (Orthopedic Surgery) Marry Guan Laurice Record, MD (Orthopedic Surgery)  Indicate any recent Medical Services you may have received from other than [facility] providers in the past year (date may be approximate).  Assessment:   This is a routine wellness examination for [facility].  Hearing/Vision screen Hearing Screening - Comments:: No trouble hearing Vision Screening - Comments:: Herbert Deaner Up to date  Dietary issues and exercise activities discussed: Current  Exercise Habits: Home exercise routine, Type of exercise: strength training/weights (stationary bike), Time (Minutes): 30, Frequency (Times/Week): 3, Weekly Exercise (Minutes/Week): 90, Intensity: Mild   Goals Addressed               This Visit's Progress     DIET - INCREASE WATER INTAKE   On track     Recommend drinking at least 6-8 glasses of water a day       Increase water intake   On track     Recommend drinking at least 4-5 glasses a day      Patient Stated   Not on track     08/14/2020, lose 15-20 pounds      Patient Stated   Not on track     08/16/2021, wants to lose about 15 pounds      Patient Stated        Would like to strength extremities Loose weight      PharmD "I want to stay healthy" (pt-stated)   On track     [place] (see longtitudinal plan of care for additional care plan information)  Current Barriers:  Polypharmacy; complex patient with multiple comorbidities including T2DM, CKD, peripheral neuropathy, allergies Reports that she plans on going out to [facility] [place] to visit her daughter and son and their families in the next few months. Waiting on diabetic shoe inserts.  Self-manages medications T2DM: Most recent A1c 7.6%, currently taking Jardiance 25 mg daily; glipizide 2.5 mg QAM Glucose readings: fastings and early afternoons ~ 100-110s Exercise: using exercise bike, stretch bands, and range of motion exercises Meal patterns:  Has increased more leafy greens Decreased sweets intake; limits portion sizes when she does eat some sweets Renal function limits metformin; hx Ozempic caused GI upset HTN: telmisartan 40 mg daily Not checking BP yet ASCVD risk reduction: atorvastatin 20 mg daily; last LDL at goal <70 Peripheral neuropathy: gabapentin 400 mg TID; notes that she occasionally skips midday dose because it makes her sleepy Osteopenia: T score femoral spine -2.2; FRAX risk of major osteoporotic fracture 11%, hip fracture 1.7% Vitamin  D3 2000 units daily Allergies: cetirizine 10 mg daily PRN, flonase nasal PRN Supplements: black seed oil, Vitamin E, CoQ10, omega 3 fatty acids    Pharmacist Clinical Goal(s):  Over the next 90 days, patient will work with PharmD and provider towards optimized medication management  Interventions: Comprehensive medication review performed, medication list updated in electronic medical record Inter-disciplinary care team collaboration (see longitudinal plan of care) Encouraged to start checking occasional 2 hour post prandial sugar readings. Providing with blood sugar and blood pressure log to document readings.  Discussed glipizide's impact on prandial sugar readings. Discussed that breakfast is a smaller meal for her. May consider changing glipizide to supper based upon readings, if still needed.  Patient notes she is planning on going out to shop tomorrow, so will pick up a blood pressure machine. Encouraged to start documenting BP readings as well.  Patient Self Care Activities:  Patient will take medications as prescribed  Please see past updates related to this goal by clicking on the "Past Updates" button in the selected goal         Depression Screen    08/18/2022   10:51 AM 08/18/2022  10:45 AM 05/30/2022   10:50 AM 02/07/2022    1:08 PM 12/13/2021    9:56 AM 11/09/2021    8:49 AM 08/16/2021   10:34 AM  PHQ 2/9 Scores  PHQ - 2 Score 0 0 0 0 0 0 0  PHQ- 9 Score 0 0 0 0 0 0     Fall Risk    08/18/2022   10:38 AM 05/30/2022   10:50 AM 12/13/2021    9:55 AM 11/09/2021    8:48 AM 08/16/2021   10:33 AM  Fall Risk   Falls in the past year? 0 0 0 0 0  Number falls in past yr: 0 0 0 0   Injury with Fall? 0 0 0 0   Risk for fall due to :  No Fall Risks No Fall Risks No Fall Risks Medication side effect  Follow up Falls evaluation completed;Education provided;Falls prevention discussed Falls evaluation completed Falls evaluation completed Falls evaluation completed Falls  evaluation completed;Education provided;Falls prevention discussed    FALL RISK PREVENTION PERTAINING TO THE HOME:  Any stairs in or around the home? No  If so, are there any without handrails? No  Home free of loose throw rugs in walkways, pet beds, electrical cords, etc? Yes  Adequate lighting in your home to reduce risk of falls? Yes   ASSISTIVE DEVICES UTILIZED TO PREVENT FALLS:  Life alert? No  Use of a cane, walker or w/c? Yes  Grab bars in the bathroom? Yes  Shower chair or bench in shower? Yes  Elevated toilet seat or a handicapped toilet? No   TIMED UP AND GO:  Was the test performed? No .   Cognitive Function:        08/18/2022   10:39 AM 08/16/2021   10:36 AM 08/14/2020   10:52 AM 05/04/2018    8:18 AM 06/02/2017    2:07 PM  6CIT Screen  What Year? 0 points 0 points 0 points 0 points 0 points  What month? 0 points 0 points 0 points 0 points 0 points  What time? 0 points 0 points 0 points 0 points 0 points  Count back from 20 0 points 0 points 0 points 0 points 0 points  Months in reverse 0 points 0 points 0 points 0 points 0 points  Repeat phrase 0 points 0 points 0 points 0 points 0 points  Total Score 0 points 0 points 0 points 0 points 0 points    Immunizations Immunization History  Administered Date(s) Administered   PFIZER(Purple Top)SARS-COV-2 Vaccination 03/28/2020, 04/18/2020   Pneumococcal Conjugate-13 08/09/2021   Td 04/22/2015   Zoster Recombinat (Shingrix) 02/23/2017, 06/16/2017    TDAP status: Up to date  Flu Vaccine status: Due, Education has been provided regarding the importance of this vaccine. Advised may receive this vaccine at local pharmacy or Health Dept. Aware to provide a copy of the vaccination record if obtained from local pharmacy or Health Dept. Verbalized acceptance and understanding.  Pneumococcal vaccine status: Due, Education has been provided regarding the importance of this vaccine. Advised may receive this vaccine at local  pharmacy or Health Dept. Aware to provide a copy of the vaccination record if obtained from local pharmacy or Health Dept. Verbalized acceptance and understanding.  Covid-19 vaccine status: Information provided on how to obtain vaccines.   Qualifies for Shingles Vaccine? No   Zostavax completed No   Shingrix Completed?: Yes  Screening Tests Health Maintenance  Topic Date Due   Pneumonia Vaccine 42+ Years old (  2 - PPSV23 or PCV20) 08/09/2022   COVID-19 Vaccine (3 - Pfizer risk series) 09/03/2022 (Originally 05/16/2020)   INFLUENZA VACCINE  02/26/2023 (Originally 06/28/2022)   HEMOGLOBIN A1C  11/30/2022   OPHTHALMOLOGY EXAM  04/05/2023   FOOT EXAM  05/31/2023   Diabetic kidney evaluation - GFR measurement  06/11/2023   Diabetic kidney evaluation - Urine ACR  08/11/2023   COLONOSCOPY (Pts 45-66yr Insurance coverage will need to be confirmed)  11/29/2023   MAMMOGRAM  03/18/2024   DEXA SCAN  01/27/2025   TETANUS/TDAP  04/21/2025   Hepatitis C Screening  Completed   Zoster Vaccines- Shingrix  Completed   HPV VACCINES  Aged Out    Health Maintenance  Health Maintenance Due  Topic Date Due   Pneumonia Vaccine 66 Years old (2 - PPSV23 or PCV20) 08/09/2022    Colorectal cancer screening: Type of screening: Colonoscopy. Completed 2015. Repeat every 10 years  Mammogram status: Completed  . Repeat every year  Bone Density status: Completed  . Results reflect: Bone density results: OSTEOPENIA. Repeat every 5 years.  Lung Cancer Screening: (Low Dose CT Chest recommended if Age 66-80years, 30 pack-year currently smoking OR have quit w/in 15years.) does not qualify.   Lung Cancer Screening Referral:   Additional Screening:  Hepatitis C Screening: does not qualify; Completed 2018  Vision Screening: Recommended annual ophthalmology exams for early detection of glaucoma and other disorders of the eye. Is the patient up to date with their annual eye exam?  Yes  Who is the provider or  what is the name of the office in which the patient attends annual eye exams? [facility] DeanerIf pt is not established with a provider, would they like to be referred to a provider to establish care? No .   Dental Screening: Recommended annual dental exams for proper oral hygiene  Community Resource Referral / Chronic Care Management: CRR required this visit?  No   CCM required this visit?  No      Plan:     I have personally reviewed and noted the following in the patient's chart:   Medical and social history Use of alcohol, tobacco or illicit drugs  Current medications and supplements including opioid prescriptions. Patient is not currently taking opioid prescriptions. Functional ability and status Nutritional status Physical activity Advanced directives List of other physicians Hospitalizations, surgeries, and ER visits in previous 12 months Vitals Screenings to include cognitive, depression, and falls Referrals and appointments  In addition, I have reviewed and discussed with patient certain preventive protocols, quality metrics, and best practice recommendations. A written personalized care plan for preventive services as well as general preventive health recommendations were provided to patient.     JLeroy Kennedy LPN   [id number]  Nurse Notes:

## 2022-08-18 NOTE — Patient Instructions (Signed)
[name] , Thank you for taking time to come for your Medicare Wellness Visit. I appreciate your ongoing commitment to your health goals. Please review the following plan we discussed and let me know if I can assist you in the future.   Screening recommendations/referrals: Colonoscopy: up to date Recommended yearly ophthalmology/optometry visit for glaucoma screening and checkup Recommended yearly dental visit for hygiene and checkup  Vaccinations: Influenza vaccine: Education provided Pneumococcal vaccine: Education provided Tdap vaccine: up to date Shingles vaccine: up to date    Advanced directives: Education provided    Next appointment: 09-20-2022 @ 9:00  Kim Thomas, Female Preventive care refers to lifestyle choices and visits with your health care provider that can promote health and wellness. What does preventive care include? A yearly physical exam. This is also called an annual well check. Dental exams once or twice a year. Routine eye exams. Ask your health care provider how often you should have your eyes checked. Personal lifestyle choices, including: Daily care of your teeth and gums. Regular physical activity. Eating a healthy diet. Avoiding tobacco and drug use. Limiting alcohol use. Practicing safe sex. Taking low doses of aspirin every day. Taking vitamin and mineral supplements as recommended by your health care provider. What happens during an annual well check? The services and screenings done by your health care provider during your annual well check will depend on your age, overall health, lifestyle risk factors, and family history of disease. Counseling  Your health care provider may ask you questions about your: Alcohol use. Tobacco use. Drug use. Emotional well-being. Home and relationship well-being. Sexual activity. Eating habits. History of falls. Memory and ability to understand (cognition). Work and work  Statistician. Screening  You may have the following tests or measurements: Height, weight, and BMI. Blood pressure. Lipid and cholesterol levels. These may be checked every 5 years, or more frequently if you are over 72 years old. Skin check. Lung cancer screening. You may have this screening every year starting at age 24 if you have a 30-pack-year history of smoking and currently smoke or have quit within the past 15 years. Fecal occult blood test (FOBT) of the stool. You may have this test every year starting at age 25. Flexible sigmoidoscopy or colonoscopy. You may have a sigmoidoscopy every 5 years or a colonoscopy every 10 years starting at age 98. Prostate cancer screening. Recommendations will vary depending on your family history and other risks. Hepatitis C blood test. Hepatitis B blood test. Sexually transmitted disease (STD) testing. Diabetes screening. This is done by checking your blood sugar (glucose) after you have not eaten for a while (fasting). You may have this done every 1-3 years. Abdominal aortic aneurysm (AAA) screening. You may need this if you are a current or former smoker. Osteoporosis. You may be screened starting at age 68 if you are at high risk. Talk with your health care provider about your test results, treatment options, and if necessary, the need for more tests. Vaccines  Your health care provider may recommend certain vaccines, such as: Influenza vaccine. This is recommended every year. Tetanus, diphtheria, and acellular pertussis (Tdap, Td) vaccine. You may need a Td booster every 10 years. Zoster vaccine. You may need this after age 31. Pneumococcal 13-valent conjugate (PCV13) vaccine. One dose is recommended after age 13. Pneumococcal polysaccharide (PPSV23) vaccine. One dose is recommended after age 21. Talk to your health care provider about which screenings and vaccines you need  and how often you need them. This information is not intended to replace  advice given to you by your health care provider. Make sure you discuss any questions you have with your health care provider. Document Released: 12/11/2015 Document Revised: 08/03/2016 Document Reviewed: 09/15/2015 Elsevier Interactive Patient Education  2017 [place] Prevention in the Home Falls can cause injuries. They can happen to people of all ages. There are many things you can do to make your home safe and to help prevent falls. What can I do on the outside of my home? Regularly fix the edges of walkways and driveways and fix any cracks. Remove anything that might make you trip as you walk through a door, such as a raised step or threshold. Trim any bushes or trees on the path to your home. Use bright outdoor lighting. Clear any walking paths of anything that might make someone trip, such as rocks or tools. Regularly check to see if handrails are loose or broken. Make sure that both sides of any steps have handrails. Any raised decks and porches should have guardrails on the edges. Have any leaves, snow, or ice cleared regularly. Use sand or salt on walking paths during winter. Clean up any spills in your garage right away. This includes oil or grease spills. What can I do in the bathroom? Use night lights. Install grab bars by the toilet and in the tub and shower. Do not use towel bars as grab bars. Use non-skid mats or decals in the tub or shower. If you need to sit down in the shower, use a plastic, non-slip stool. Keep the floor dry. Clean up any water that spills on the floor as soon as it happens. Remove soap buildup in the tub or shower regularly. Attach bath mats securely with double-sided non-slip rug tape. Do not have throw rugs and other things on the floor that can make you trip. What can I do in the bedroom? Use night lights. Make sure that you have a light by your bed that is easy to reach. Do not use any sheets or blankets that are too big for your bed.  They should not hang down onto the floor. Have a firm chair that has side arms. You can use this for support while you get dressed. Do not have throw rugs and other things on the floor that can make you trip. What can I do in the kitchen? Clean up any spills right away. Avoid walking on wet floors. Keep items that you use a lot in easy-to-reach places. If you need to reach something above you, use a strong step stool that has a grab bar. Keep electrical cords out of the way. Do not use floor polish or wax that makes floors slippery. If you must use wax, use non-skid floor wax. Do not have throw rugs and other things on the floor that can make you trip. What can I do with my stairs? Do not leave any items on the stairs. Make sure that there are handrails on both sides of the stairs and use them. Fix handrails that are broken or loose. Make sure that handrails are as long as the stairways. Check any carpeting to make sure that it is firmly attached to the stairs. Fix any carpet that is loose or worn. Avoid having throw rugs at the top or bottom of the stairs. If you do have throw rugs, attach them to the floor with carpet tape. Make sure that you  have a light switch at the top of the stairs and the bottom of the stairs. If you do not have them, ask someone to add them for you. What else can I do to help prevent falls? Wear shoes that: Do not have high heels. Have rubber bottoms. Are comfortable and fit you well. Are closed at the toe. Do not wear sandals. If you use a stepladder: Make sure that it is fully opened. Do not climb a closed stepladder. Make sure that both sides of the stepladder are locked into place. Ask someone to hold it for you, if possible. Clearly mark and make sure that you can see: Any grab bars or handrails. First and last steps. Where the edge of each step is. Use tools that help you move around (mobility aids) if they are needed. These  include: Canes. Walkers. Scooters. Crutches. Turn on the lights when you go into a dark area. Replace any light bulbs as soon as they burn out. Set up your furniture so you have a clear path. Avoid moving your furniture around. If any of your floors are uneven, fix them. If there are any pets around you, be aware of where they are. Review your medicines with your doctor. Some medicines can make you feel dizzy. This can increase your chance of falling. Ask your doctor what other things that you can do to help prevent falls. This information is not intended to replace advice given to you by your health care provider. Make sure you discuss any questions you have with your health care provider. Document Released: 09/10/2009 Document Revised: 04/21/2016 Document Reviewed: 12/19/2014 Elsevier Interactive Patient Education  2017 [employer].

## 2022-08-19 ENCOUNTER — Ambulatory Visit: Payer: Medicare Other

## 2022-08-22 ENCOUNTER — Other Ambulatory Visit: Payer: Self-pay | Admitting: Nurse Practitioner

## 2022-08-22 DIAGNOSIS — M5416 Radiculopathy, lumbar region: Secondary | ICD-10-CM | POA: Diagnosis not present

## 2022-08-22 DIAGNOSIS — M5432 Sciatica, left side: Secondary | ICD-10-CM | POA: Diagnosis not present

## 2022-08-22 DIAGNOSIS — M9905 Segmental and somatic dysfunction of pelvic region: Secondary | ICD-10-CM | POA: Diagnosis not present

## 2022-08-22 DIAGNOSIS — M9903 Segmental and somatic dysfunction of lumbar region: Secondary | ICD-10-CM | POA: Diagnosis not present

## 2022-08-23 NOTE — Telephone Encounter (Signed)
Requested Prescriptions  Pending Prescriptions Disp Refills  . atorvastatin (LIPITOR) 20 MG tablet [Pharmacy Med Name: Atorvastatin Calcium 20 MG Oral Tablet] 100 tablet 1    Sig: TAKE 1 TABLET BY MOUTH ONCE  DAILY     Cardiovascular:  Antilipid - Statins Failed - 08/22/2022 10:17 PM      Failed - Lipid Panel in normal range within the last 12 months    Cholesterol, Total  Date Value Ref Range Status  02/07/2022 112 100 - 199 mg/dL Final   Cholesterol Piccolo, Waived  Date Value Ref Range Status  05/28/2019 150 <200 mg/dL Final    Comment:                            Desirable                <200                         Borderline High      200- 239                         High                     >239    LDL Chol Calc (NIH)  Date Value Ref Range Status  02/07/2022 36 0 - 99 mg/dL Final   HDL  Date Value Ref Range Status  02/07/2022 56 >39 mg/dL Final   Triglycerides  Date Value Ref Range Status  02/07/2022 112 0 - 149 mg/dL Final   Triglycerides Piccolo,Waived  Date Value Ref Range Status  05/28/2019 92 <150 mg/dL Final    Comment:                            Normal                   <150                         Borderline High     150 - 199                         High                200 - 499                         Very High                >499          Passed - Patient is not pregnant      Passed - Valid encounter within last 12 months    Recent Outpatient Visits          2 months ago Type 2 diabetes mellitus with diabetic nephropathy, without long-term current use of insulin ([place])   [place], Jolene T, NP   6 months ago Type 2 diabetes mellitus with diabetic nephropathy, without long-term current use of insulin ([place])   [place], Woodworth T, NP   8 months ago Chronic inflammatory arthritis   [place], [place] T, NP   9 months ago Type 2 diabetes mellitus with diabetic nephropathy, without  long-term current  use of insulin ([place])   [place], [place] T, NP   1 year ago Type 2 diabetes mellitus with diabetic nephropathy, without long-term current use of insulin ([place])   [place], [name], NP      Future Appointments            In 4 weeks Cannady, [name], NP [facility]   In 1 month Rice, Resa Miner, MD [facility] Dept Of [place]. [place]   In 12 months  [facility]

## 2022-08-30 ENCOUNTER — Ambulatory Visit: Payer: Medicare Other | Admitting: Nurse Practitioner

## 2022-09-01 ENCOUNTER — Other Ambulatory Visit: Payer: Self-pay | Admitting: Nurse Practitioner

## 2022-09-02 NOTE — Telephone Encounter (Signed)
Requested Prescriptions  Pending Prescriptions Disp Refills  . ONETOUCH ULTRA test strip [employer] Med Name: OneTouch Ultra In Vitro Strip] 100 strip 2    Sig: USE TO [place]  DAILY     Endocrinology: Diabetes - Testing Supplies Passed - 09/01/2022 11:47 PM      Passed - Valid encounter within last 12 months    Recent Outpatient Visits          3 months ago Type 2 diabetes mellitus with diabetic nephropathy, without long-term current use of insulin ([place])   [place], Jolene T, NP   6 months ago Type 2 diabetes mellitus with diabetic nephropathy, without long-term current use of insulin ([place])   [place], [place] T, NP   8 months ago Chronic inflammatory arthritis   [place], Jolene T, NP   9 months ago Type 2 diabetes mellitus with diabetic nephropathy, without long-term current use of insulin ([place])   [place], [place] T, NP   1 year ago Type 2 diabetes mellitus with diabetic nephropathy, without long-term current use of insulin ([place])   [place], [name], NP      Future Appointments            In 2 weeks Cannady, [name], NP [facility]   In 1 month Rice, Resa Miner, MD [facility] A Dept Of [place]. [place]   In 11 months  [facility]

## 2022-09-05 ENCOUNTER — Other Ambulatory Visit: Payer: Self-pay | Admitting: Internal Medicine

## 2022-09-05 DIAGNOSIS — R768 Other specified abnormal immunological findings in serum: Secondary | ICD-10-CM

## 2022-09-05 DIAGNOSIS — M79641 Pain in right hand: Secondary | ICD-10-CM

## 2022-09-05 DIAGNOSIS — M059 Rheumatoid arthritis with rheumatoid factor, unspecified: Secondary | ICD-10-CM

## 2022-09-05 NOTE — Telephone Encounter (Signed)
Next Visit: 10/19/2022  Last Visit: 04/18/2022  Labs: 08/10/2022 CBC WNL 06/10/2022 BMP Creatinine 1.28, eGFR 46, CO2 17  Eye exam:  05/04/2022 normal   Current Dose per office note 04/18/2022:  hydroxychloroquine decrease dose to 300 mg daily  CV:[id number] rheumatoid arthritis   Last Fill: 06/27/2022  Okay to refill Plaquenil?

## 2022-09-19 DIAGNOSIS — M9905 Segmental and somatic dysfunction of pelvic region: Secondary | ICD-10-CM | POA: Diagnosis not present

## 2022-09-19 DIAGNOSIS — M5416 Radiculopathy, lumbar region: Secondary | ICD-10-CM | POA: Diagnosis not present

## 2022-09-19 DIAGNOSIS — M9903 Segmental and somatic dysfunction of lumbar region: Secondary | ICD-10-CM | POA: Diagnosis not present

## 2022-09-19 DIAGNOSIS — M5432 Sciatica, left side: Secondary | ICD-10-CM | POA: Diagnosis not present

## 2022-09-20 ENCOUNTER — Encounter: Payer: Self-pay | Admitting: Nurse Practitioner

## 2022-09-20 ENCOUNTER — Ambulatory Visit (INDEPENDENT_AMBULATORY_CARE_PROVIDER_SITE_OTHER): Payer: Medicare Other | Admitting: Nurse Practitioner

## 2022-09-20 VITALS — BP 120/69 | HR 86 | Temp 98.0°F | Ht 64.0 in | Wt 191.5 lb

## 2022-09-20 DIAGNOSIS — E1169 Type 2 diabetes mellitus with other specified complication: Secondary | ICD-10-CM | POA: Diagnosis not present

## 2022-09-20 DIAGNOSIS — Z6832 Body mass index (BMI) 32.0-32.9, adult: Secondary | ICD-10-CM

## 2022-09-20 DIAGNOSIS — E1121 Type 2 diabetes mellitus with diabetic nephropathy: Secondary | ICD-10-CM | POA: Diagnosis not present

## 2022-09-20 DIAGNOSIS — E1159 Type 2 diabetes mellitus with other circulatory complications: Secondary | ICD-10-CM

## 2022-09-20 DIAGNOSIS — M85852 Other specified disorders of bone density and structure, left thigh: Secondary | ICD-10-CM

## 2022-09-20 DIAGNOSIS — N183 Chronic kidney disease, stage 3 unspecified: Secondary | ICD-10-CM

## 2022-09-20 DIAGNOSIS — Z9189 Other specified personal risk factors, not elsewhere classified: Secondary | ICD-10-CM

## 2022-09-20 DIAGNOSIS — E559 Vitamin D deficiency, unspecified: Secondary | ICD-10-CM | POA: Diagnosis not present

## 2022-09-20 DIAGNOSIS — M059 Rheumatoid arthritis with rheumatoid factor, unspecified: Secondary | ICD-10-CM | POA: Diagnosis not present

## 2022-09-20 DIAGNOSIS — E785 Hyperlipidemia, unspecified: Secondary | ICD-10-CM | POA: Diagnosis not present

## 2022-09-20 DIAGNOSIS — E1122 Type 2 diabetes mellitus with diabetic chronic kidney disease: Secondary | ICD-10-CM

## 2022-09-20 DIAGNOSIS — G4733 Obstructive sleep apnea (adult) (pediatric): Secondary | ICD-10-CM | POA: Diagnosis not present

## 2022-09-20 DIAGNOSIS — I152 Hypertension secondary to endocrine disorders: Secondary | ICD-10-CM

## 2022-09-20 DIAGNOSIS — E6609 Other obesity due to excess calories: Secondary | ICD-10-CM

## 2022-09-20 LAB — BAYER DCA HB A1C WAIVED: HB A1C (BAYER DCA - WAIVED): 5.1 % (ref 4.8–5.6)

## 2022-09-20 MED ORDER — GABAPENTIN 400 MG PO CAPS: 400.0000 mg | ORAL_CAPSULE | Freq: Three times a day (TID) | ORAL | 4 refills | Status: DC

## 2022-09-20 NOTE — Assessment & Plan Note (Signed)
Chronic, ongoing.  Continue Telmisartan for kidney protection.  Labs up to date with nephrology.  Continue collaboration with nephrology, appreciate their input.  Recent note reviewed.

## 2022-09-20 NOTE — Patient Instructions (Signed)

## 2022-09-20 NOTE — Assessment & Plan Note (Signed)
BMI 32.87 with T2DM, HTN/HLD.  Recommended eating smaller high protein, low fat meals more frequently and exercising 30 mins a day 5 times a week with a goal of 10-15lb weight loss in the next 3 months. Patient voiced their understanding and motivation to adhere to these recommendations.

## 2022-09-20 NOTE — Assessment & Plan Note (Signed)
Chronic, ongoing.  A1c 5.1% today, praised for ongoing success with A1c in normal range now.  Urine ALB 10 (March 2023), continue Telmisartan for kidney protection.  Continue Ozempic 1 MG weekly which is offering ongoing benefit to diabetes and weight.  Continue off Jardiance as is tolerating this. Continue increased diet focus and monitoring BS twice a day.  Recommend focus on healthy weight loss and exercise daily.  Have recommended if any consistent lows <70 immediately alert provider so can adjust medication.  Return in 3 months.

## 2022-09-20 NOTE — Assessment & Plan Note (Signed)
Chronic, ongoing.  Continue current medication regimen and adjust as needed. Lipid panel today. 

## 2022-09-20 NOTE — Assessment & Plan Note (Signed)
Ongoing and improved.  Diagnosed on 01/20/22, continue collaboration with rheumatology and Plaquenil as ordered by them, she is noticing improved symptoms.  Recommend massage therapy added to regimen and continue to visit with chiropractor -- consider physical therapy in future if ongoing neck pain.

## 2022-09-20 NOTE — Assessment & Plan Note (Signed)
Ongoing.  Noted on recent DEXA -- continue supplements daily and check Vit D level today.  Plan for repeat DEXA in 2026.  Discussed fall prevention.   

## 2022-09-20 NOTE — Progress Notes (Signed)
BP 120/69   Pulse 86   Temp 98 F (36.7 C) (Oral)   Ht 5' 4" (1.626 m)   Wt 191 lb 8 oz (86.9 kg)   LMP  (LMP Unknown)   SpO2 98%   BMI 32.87 kg/m    Subjective:    Patient ID: Kim Thomas, female    DOB: 15-Sep-1956, 66 y.o.   MRN: [id number]  HPI: Kim Thomas is a 66 y.o. female  Chief Complaint  Patient presents with   Diabetes   Hyperlipidemia   Hypertension   Chronic Kidney Disease   Neck Pain    Pt states she went to her Chiropractor yesterday, was informed that the head pain she was having is actually coming from her neck.    DIABETES July A1c was 5.7%.  Continues on Ozempic 1 MG weekly.  Stopped Jardiance on 11/19/21 due to improving A1c with Ozempic.  Continues to maintain weight loss.  She reports recent cheating on diet on occasion.   Taking Gabapentin 400 MG BID for neuropathy, is ordered TID and if she is having a bad day will take the 3rd dose. Hypoglycemic episodes: no Polydipsia/polyuria: no Visual disturbance: no Chest pain: no Paresthesias: no Glucose Monitoring: yes             Accucheck frequency: occasionally             Fasting glucose: 90 to 110             Post prandial:             Evening:              Before meals:  Taking Insulin?: no             Long acting insulin:             Short acting insulin: Blood Pressure Monitoring: not checking Retinal Examination: Up To Date Foot Exam: Up to Date Pneumovax: Up To Date Influenza: refuses Aspirin: yes  HYPERTENSION / HYPERLIPIDEMIA Continues on Atorvastatin 20 MG daily and Telmisartan 40 MG.  Is not using CPAP at night, has stopped using as does not know feel she needs it since weight loss.  Denies any fatigue during day or headaches. Satisfied with current treatment? yes Duration of hypertension: chronic BP monitoring frequency: not checking BP range:  BP medication side effects: no Duration of hyperlipidemia: chronic Cholesterol medication side effects: no Cholesterol  supplements: none Medication compliance: good compliance Aspirin: yes Recent stressors: no Recurrent headaches: no Visual changes: no Palpitations: no Dyspnea: no Chest pain: no Lower extremity edema: only if sitting for long periods Dizzy/lightheaded: no The ASCVD Risk score (Arnett DK, et al., 2019) failed to calculate for the following reasons:   The valid total cholesterol range is 130 to 320 mg/dL   CHRONIC KIDNEY DISEASE Visits with nephrology, Dr. Candiss Norse, last on 08/18/22.  eGFR 46 and creatinine was 1.28. CKD status: stable Medications renally dose: yes Previous renal evaluation: yes Pneumovax:  Up To Date Influenza Vaccine:  Refuses  OSTEOPENIA Last DEXA in 2021 with T-score -2.2. Satisfied with current treatment?: yes Adequate calcium & vitamin D: yes Weight bearing exercises: yes    RHEUMATOID ARTHRITIS Initial visit with rheumatology on 04/18/22 and continues Plaquenil.  Reports ongoing improvement in pain with no further hand pain.  History of elevation in uric acid levels, no current medications.  She is having some right neck to right shoulder pain -- seeing chiropractor who recommends massage therapy and  then possibly PT in future. Duration: months Pain: no Context:  better Decreased function/range of motion: improved Erythema: none Swelling: improved Heat or warmth: none Morning stiffness: improved Aggravating factors: none Alleviating factors: Plaquenil Relief with NSAIDs?: No NSAIDs Taken Treatments attempted:  Parafin wax, Tylenol, Plaquenil, CBD cream Involved Joints:     Hands: yes bilateral    Wrists: none    Elbows: none    Shoulders: none    Back: yes     Hips: none    Knees: none    Ankles: none    Feet: none   Relevant past medical, surgical, family and social history reviewed and updated as indicated. Interim medical history since our last visit reviewed. Allergies and medications reviewed and updated.  Review of Systems   Constitutional:  Negative for activity change, appetite change, diaphoresis, fatigue and fever.  Respiratory:  Negative for cough, chest tightness and shortness of breath.   Cardiovascular:  Negative for chest pain, palpitations and leg swelling.  Gastrointestinal: Negative.   Endocrine: Negative for cold intolerance, heat intolerance, polydipsia, polyphagia and polyuria.  Neurological: Negative.   Psychiatric/Behavioral: Negative.     Per HPI unless specifically indicated above     Objective:    BP 120/69   Pulse 86   Temp 98 F (36.7 C) (Oral)   Ht 5' 4" (1.626 m)   Wt 191 lb 8 oz (86.9 kg)   LMP  (LMP Unknown)   SpO2 98%   BMI 32.87 kg/m   Wt Readings from Last 3 Encounters:  09/20/22 191 lb 8 oz (86.9 kg)  05/30/22 190 lb 6.4 oz (86.4 kg)  04/18/22 193 lb (87.5 kg)    Physical Exam Vitals and nursing note reviewed.  Constitutional:      General: She is awake. She is not in acute distress.    Appearance: She is well-developed. She is obese. She is not ill-appearing.  HENT:     Head: Normocephalic.     Right Ear: Hearing normal. No drainage.     Left Ear: Hearing normal. No drainage.  Eyes:     General: Lids are normal.        Right eye: No discharge.        Left eye: No discharge.     Conjunctiva/sclera: Conjunctivae normal.     Pupils: Pupils are equal, round, and reactive to light.  Neck:     Vascular: No carotid bruit.  Cardiovascular:     Rate and Rhythm: Normal rate and regular rhythm.     Heart sounds: Normal heart sounds. No murmur heard.    No gallop.  Pulmonary:     Effort: Pulmonary effort is normal. No accessory muscle usage or respiratory distress.     Breath sounds: Normal breath sounds.  Abdominal:     General: Bowel sounds are normal.     Palpations: Abdomen is soft.  Musculoskeletal:     Cervical back: Normal range of motion and neck supple.     Right lower leg: No edema.     Left lower leg: No edema.  Skin:    General: Skin is warm and  dry.  Neurological:     Mental Status: She is alert and oriented to person, place, and time.  Psychiatric:        Attention and Perception: Attention normal.        Mood and Affect: Mood normal.        Speech: Speech normal.  Behavior: Behavior normal. Behavior is cooperative.        Thought Content: Thought content normal.    Results for orders placed or performed in visit on 80/88/11  Basic Metabolic Panel (BMET)  Result Value Ref Range   Glucose 77 70 - 99 mg/dL   BUN 18 8 - 27 mg/dL   Creatinine, Ser 1.28 (H) 0.57 - 1.00 mg/dL   eGFR 46 (L) >59 mL/min/1.73   BUN/Creatinine Ratio 14 12 - 28   Sodium 138 134 - 144 mmol/L   Potassium CANCELED mmol/L   Chloride 99 96 - 106 mmol/L   CO2 17 (L) 20 - 29 mmol/L   Calcium 9.7 8.7 - 10.3 mg/dL  Uric acid  Result Value Ref Range   Uric Acid 6.5 3.0 - 7.2 mg/dL      Assessment & Plan:   Problem List Items Addressed This Visit       Cardiovascular and Mediastinum   Hypertension associated with diabetes (HCC)    Chronic, ongoing.  BP well at goal in office today.  Recommend she check BP at home three mornings a week and document for provider.  Continue current medication regimen and adjust as needed.  LABS: up to date with nephrology.  Educated her on Telmisartan and kidney benefit.  Urine micro alb 10 and A:C <24 February 2022.  Focus on DASH diet at home.  Return in 3 months.      Relevant Orders   Bayer DCA Hb A1c Waived     Respiratory   Sleep apnea    Not using CPAP at this time, reports no symptoms.  Consider repeat sleep study in future and restarting.          Endocrine   CKD stage 3 due to type 2 diabetes mellitus (HCC)    Chronic, ongoing.  Continue Telmisartan for kidney protection.  Labs up to date with nephrology.  Continue collaboration with nephrology, appreciate their input.  Recent note reviewed.      Relevant Orders   Bayer DCA Hb A1c Waived   Hyperlipidemia associated with type 2 diabetes mellitus  (HCC)    Chronic, ongoing.  Continue current medication regimen and adjust as needed.  Lipid panel today.      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  A1c 5.1% today, praised for ongoing success with A1c in normal range now.  Urine ALB 10 (March 2023), continue Telmisartan for kidney protection.  Continue Ozempic 1 MG weekly which is offering ongoing benefit to diabetes and weight.  Continue off Jardiance as is tolerating this. Continue increased diet focus and monitoring BS twice a day.  Recommend focus on healthy weight loss and exercise daily.  Have recommended if any consistent lows <70 immediately alert provider so can adjust medication.  Return in 3 months.      Relevant Orders   Bayer DCA Hb A1c Waived     Musculoskeletal and Integument   Osteopenia of neck of left femur    Ongoing.  Noted on recent DEXA -- continue supplements daily and check Vit D level today.  Plan for repeat DEXA in 2026.  Discussed fall prevention.        Relevant Orders   VITAMIN D 25 Hydroxy (Vit-D Deficiency, Fractures)   Seropositive rheumatoid arthritis ([place])    Ongoing and improved.  Diagnosed  on 01/20/22, continue collaboration with rheumatology and Plaquenil as ordered by them, she is noticing improved symptoms.  Recommend massage therapy added to regimen and continue to visit with chiropractor -- consider physical therapy in future if ongoing neck pain.        Other   Obesity    BMI 32.87 with T2DM, HTN/HLD.  Recommended eating smaller high protein, low fat meals more frequently and exercising 30 mins a day 5 times a week with a goal of 10-15lb weight loss in the next 3 months. Patient voiced their understanding and motivation to adhere to these recommendations.       Vitamin D deficiency   Relevant Orders   VITAMIN D 25 Hydroxy (Vit-D Deficiency, Fractures)   Other  Visit Diagnoses     Pneumococcal vaccination indicated       PCV20 in office today.   Relevant Orders   Pneumococcal conjugate vaccine 20-valent (Prevnar 20) (Completed)        Follow up plan: Return in about 3 months (around 12/21/2022) for T2DM, HTN/HLD, OSTEOPENIA, GERD, CKD, RA.

## 2022-09-20 NOTE — Assessment & Plan Note (Signed)
Not using CPAP at this time, reports no symptoms.  Consider repeat sleep study in future and restarting.

## 2022-09-20 NOTE — Assessment & Plan Note (Signed)
Chronic, ongoing.  BP well at goal in office today.  Recommend she check BP at home three mornings a week and document for provider.  Continue current medication regimen and adjust as needed.  LABS: up to date with nephrology.  Educated her on Telmisartan and kidney benefit.  Urine micro alb 10 and A:C <24 February 2022.  Focus on DASH diet at home.  Return in 3 months.

## 2022-09-21 DIAGNOSIS — M9903 Segmental and somatic dysfunction of lumbar region: Secondary | ICD-10-CM | POA: Diagnosis not present

## 2022-09-21 DIAGNOSIS — M5416 Radiculopathy, lumbar region: Secondary | ICD-10-CM | POA: Diagnosis not present

## 2022-09-21 DIAGNOSIS — M5432 Sciatica, left side: Secondary | ICD-10-CM | POA: Diagnosis not present

## 2022-09-21 DIAGNOSIS — M9905 Segmental and somatic dysfunction of pelvic region: Secondary | ICD-10-CM | POA: Diagnosis not present

## 2022-09-21 LAB — VITAMIN D 25 HYDROXY (VIT D DEFICIENCY, FRACTURES): Vit D, 25-Hydroxy: 38 ng/mL (ref 30.0–100.0)

## 2022-09-21 LAB — LIPID PANEL W/O CHOL/HDL RATIO
Cholesterol, Total: 134 mg/dL (ref 100–199)
HDL: 69 mg/dL (ref >39)
LDL Chol Calc (NIH): 53 mg/dL (ref 0–99)
Triglycerides: 55 mg/dL (ref 0–149)
VLDL Cholesterol Cal: 12 mg/dL (ref 5–40)

## 2022-09-21 NOTE — Progress Notes (Signed)
Contacted via [place] afternoon Glenard Haring, all labs have returned and [place]!!!  These look great.  Continue all current medications.  Cholesterol levels at goal and Vitamin D normal:)  You are amazing!! Keep being incredible!!  Thank you for allowing me to participate in your care.  I appreciate you. Kindest regards, Mischelle Reeg

## 2022-09-26 DIAGNOSIS — M5416 Radiculopathy, lumbar region: Secondary | ICD-10-CM | POA: Diagnosis not present

## 2022-09-26 DIAGNOSIS — M9905 Segmental and somatic dysfunction of pelvic region: Secondary | ICD-10-CM | POA: Diagnosis not present

## 2022-09-26 DIAGNOSIS — M5432 Sciatica, left side: Secondary | ICD-10-CM | POA: Diagnosis not present

## 2022-09-26 DIAGNOSIS — M9903 Segmental and somatic dysfunction of lumbar region: Secondary | ICD-10-CM | POA: Diagnosis not present

## 2022-10-03 DIAGNOSIS — M9905 Segmental and somatic dysfunction of pelvic region: Secondary | ICD-10-CM | POA: Diagnosis not present

## 2022-10-03 DIAGNOSIS — M5432 Sciatica, left side: Secondary | ICD-10-CM | POA: Diagnosis not present

## 2022-10-03 DIAGNOSIS — M9903 Segmental and somatic dysfunction of lumbar region: Secondary | ICD-10-CM | POA: Diagnosis not present

## 2022-10-03 DIAGNOSIS — M5416 Radiculopathy, lumbar region: Secondary | ICD-10-CM | POA: Diagnosis not present

## 2022-10-10 DIAGNOSIS — M9905 Segmental and somatic dysfunction of pelvic region: Secondary | ICD-10-CM | POA: Diagnosis not present

## 2022-10-10 DIAGNOSIS — M5432 Sciatica, left side: Secondary | ICD-10-CM | POA: Diagnosis not present

## 2022-10-10 DIAGNOSIS — M5416 Radiculopathy, lumbar region: Secondary | ICD-10-CM | POA: Diagnosis not present

## 2022-10-10 DIAGNOSIS — M9903 Segmental and somatic dysfunction of lumbar region: Secondary | ICD-10-CM | POA: Diagnosis not present

## 2022-10-17 DIAGNOSIS — M5416 Radiculopathy, lumbar region: Secondary | ICD-10-CM | POA: Diagnosis not present

## 2022-10-17 DIAGNOSIS — M9905 Segmental and somatic dysfunction of pelvic region: Secondary | ICD-10-CM | POA: Diagnosis not present

## 2022-10-17 DIAGNOSIS — M5432 Sciatica, left side: Secondary | ICD-10-CM | POA: Diagnosis not present

## 2022-10-17 DIAGNOSIS — M9903 Segmental and somatic dysfunction of lumbar region: Secondary | ICD-10-CM | POA: Diagnosis not present

## 2022-10-19 ENCOUNTER — Encounter: Payer: Self-pay | Admitting: Internal Medicine

## 2022-10-19 ENCOUNTER — Ambulatory Visit: Payer: Medicare Other | Attending: Internal Medicine | Admitting: Internal Medicine

## 2022-10-19 VITALS — BP 125/77 | HR 80 | Resp 17 | Ht 64.0 in | Wt 198.0 lb

## 2022-10-19 DIAGNOSIS — Z79899 Other long term (current) drug therapy: Secondary | ICD-10-CM | POA: Diagnosis not present

## 2022-10-19 DIAGNOSIS — M059 Rheumatoid arthritis with rheumatoid factor, unspecified: Secondary | ICD-10-CM | POA: Diagnosis not present
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
## 2022-10-20 LAB — COMPLETE METABOLIC PANEL WITH GFR
AG Ratio: 1.3 (calc) (ref 1.0–2.5)
ALT: 17 U/L (ref 6–29)
AST: 24 U/L (ref 10–35)
Albumin: 3.9 g/dL (ref 3.6–5.1)
Alkaline phosphatase (APISO): 65 U/L (ref 37–153)
BUN: 15 mg/dL (ref 7–25)
CO2: 28 mmol/L (ref 20–32)
Calcium: 9.1 mg/dL (ref 8.6–10.4)
Chloride: 106 mmol/L (ref 98–110)
Creat: 1.01 mg/dL (ref 0.50–1.05)
Globulin: 3.1 g/dL (calc) (ref 1.9–3.7)
Glucose, Bld: 80 mg/dL (ref 65–99)
Potassium: 4.2 mmol/L (ref 3.5–5.3)
Sodium: 141 mmol/L (ref 135–146)
Total Bilirubin: 0.5 mg/dL (ref 0.2–1.2)
Total Protein: 7 g/dL (ref 6.1–8.1)
eGFR: 61 mL/min/{1.73_m2} (ref >=60)

## 2022-10-20 LAB — SEDIMENTATION RATE: Sed Rate: 9 mm/h (ref 0–30)

## 2022-10-20 LAB — C-REACTIVE PROTEIN: CRP: 1.5 mg/L (ref <8.0)

## 2022-10-24 ENCOUNTER — Ambulatory Visit (INDEPENDENT_AMBULATORY_CARE_PROVIDER_SITE_OTHER): Payer: Medicare Other | Admitting: Podiatry

## 2022-10-24 ENCOUNTER — Ambulatory Visit (INDEPENDENT_AMBULATORY_CARE_PROVIDER_SITE_OTHER): Payer: Medicare Other

## 2022-10-24 DIAGNOSIS — S9032XA Contusion of left foot, initial encounter: Secondary | ICD-10-CM

## 2022-10-24 DIAGNOSIS — S92502A Displaced unspecified fracture of left lesser toe(s), initial encounter for closed fracture: Secondary | ICD-10-CM | POA: Diagnosis not present

## 2022-10-24 NOTE — Progress Notes (Signed)
Lab results look great her sedimentation rate and CRP remain normal and in fact have continued to decrease again since her last visit.  Metabolic panel is normal her kidney function number that was slightly low a few months ago is back to normal.  No problems for continuing the hydroxychloroquine 300 mg daily.

## 2022-10-24 NOTE — Progress Notes (Signed)
  Subjective:  Patient ID: Kim Thomas, female    DOB: 11/12/1956,  MRN: [id number]  Chief Complaint  Patient presents with   Toe Pain    left 4th digit possibly broken    66 y.o. female presents with the above complaint. History confirmed with patient.  She stubbed the toe and think she may have broken it.  She previously broke a similar toe a couple years ago  Objective:  Physical Exam: warm, good capillary refill, no trophic changes or ulcerative lesions, normal DP and PT pulses, and normal sensory exam. Left Foot:  Pain and tenderness and edema around the fourth toe with bruising   Radiographs: Multiple views x-ray of the left foot: Minimally displaced proximal phalanx midshaft fracture Assessment:   1. Closed fracture of phalanx of left fourth toe, initial encounter      Plan:  Patient was evaluated and treated and all questions answered.  We reviewed direct together discussed that there is a fracture of the fourth proximal phalanx.  We discussed splinting and buddy taping the toe together which I demonstrated and performed today.  Recommended immobilization with a surgical shoe because she has not tolerated regular shoe gear.  She will follow-up with as needed if it does not heal she has been through this process before and understands the length of time it takes for fracture healing to happen Return if symptoms worsen or fail to improve.

## 2022-10-25 DIAGNOSIS — M9905 Segmental and somatic dysfunction of pelvic region: Secondary | ICD-10-CM | POA: Diagnosis not present

## 2022-10-25 DIAGNOSIS — M5416 Radiculopathy, lumbar region: Secondary | ICD-10-CM | POA: Diagnosis not present

## 2022-10-25 DIAGNOSIS — M9903 Segmental and somatic dysfunction of lumbar region: Secondary | ICD-10-CM | POA: Diagnosis not present

## 2022-10-25 DIAGNOSIS — M5432 Sciatica, left side: Secondary | ICD-10-CM | POA: Diagnosis not present

## 2022-11-01 DIAGNOSIS — M9903 Segmental and somatic dysfunction of lumbar region: Secondary | ICD-10-CM | POA: Diagnosis not present

## 2022-11-01 DIAGNOSIS — M5416 Radiculopathy, lumbar region: Secondary | ICD-10-CM | POA: Diagnosis not present

## 2022-11-01 DIAGNOSIS — M5432 Sciatica, left side: Secondary | ICD-10-CM | POA: Diagnosis not present

## 2022-11-01 DIAGNOSIS — M9905 Segmental and somatic dysfunction of pelvic region: Secondary | ICD-10-CM | POA: Diagnosis not present

## 2022-11-14 ENCOUNTER — Other Ambulatory Visit: Payer: Self-pay | Admitting: Internal Medicine

## 2022-11-14 DIAGNOSIS — M059 Rheumatoid arthritis with rheumatoid factor, unspecified: Secondary | ICD-10-CM

## 2022-11-14 DIAGNOSIS — R768 Other specified abnormal immunological findings in serum: Secondary | ICD-10-CM

## 2022-11-14 DIAGNOSIS — M79642 Pain in left hand: Secondary | ICD-10-CM

## 2022-11-15 DIAGNOSIS — M5432 Sciatica, left side: Secondary | ICD-10-CM | POA: Diagnosis not present

## 2022-11-15 DIAGNOSIS — M5416 Radiculopathy, lumbar region: Secondary | ICD-10-CM | POA: Diagnosis not present

## 2022-11-15 DIAGNOSIS — M9903 Segmental and somatic dysfunction of lumbar region: Secondary | ICD-10-CM | POA: Diagnosis not present

## 2022-11-15 DIAGNOSIS — M9905 Segmental and somatic dysfunction of pelvic region: Secondary | ICD-10-CM | POA: Diagnosis not present

## 2022-11-29 DIAGNOSIS — M5432 Sciatica, left side: Secondary | ICD-10-CM | POA: Diagnosis not present

## 2022-11-29 DIAGNOSIS — M5416 Radiculopathy, lumbar region: Secondary | ICD-10-CM | POA: Diagnosis not present

## 2022-11-29 DIAGNOSIS — M9905 Segmental and somatic dysfunction of pelvic region: Secondary | ICD-10-CM | POA: Diagnosis not present

## 2022-11-29 DIAGNOSIS — M9903 Segmental and somatic dysfunction of lumbar region: Secondary | ICD-10-CM | POA: Diagnosis not present

## 2022-12-05 ENCOUNTER — Other Ambulatory Visit: Payer: Self-pay | Admitting: Nurse Practitioner

## 2022-12-06 NOTE — Telephone Encounter (Signed)
Rx was sent to pharmacy on 02/24/22 #90/3. Pt would have refills until 02/25/23. Pt has upcoming appt on 12/21/22 with PCP.   Requested Prescriptions  Refused Prescriptions Disp Refills   omeprazole (PRILOSEC) 20 MG capsule [Pharmacy Med Name: Omeprazole 20 MG Oral Capsule Delayed Release] 100 capsule 2    Sig: TAKE 1 CAPSULE BY MOUTH DAILY     Gastroenterology: Proton Pump Inhibitors Passed - 12/05/2022 11:24 PM      Passed - Valid encounter within last 12 months    Recent Outpatient Visits           2 months ago Type 2 diabetes mellitus with diabetic nephropathy, without long-term current use of insulin ([place])   [place], Jolene T, NP   6 months ago Type 2 diabetes mellitus with diabetic nephropathy, without long-term current use of insulin ([place])   [place], Jolene T, NP   10 months ago Type 2 diabetes mellitus with diabetic nephropathy, without long-term current use of insulin ([place])   [place], [place] T, NP   11 months ago Chronic inflammatory arthritis   [place], Travilah T, NP   1 year ago Type 2 diabetes mellitus with diabetic nephropathy, without long-term current use of insulin ([place])   [place], [name], NP       Future Appointments             In 2 weeks [place], [name], NP [employer]   In 4 months Rice, Resa Miner, MD [facility] [place] [place]   In 8 months  [employer] [facility]

## 2022-12-18 DIAGNOSIS — K219 Gastro-esophageal reflux disease without esophagitis: Secondary | ICD-10-CM | POA: Insufficient documentation

## 2022-12-18 NOTE — Patient Instructions (Incomplete)
Food Basics for Chronic Kidney Disease Chronic kidney disease (CKD) is when your kidneys are not working well. They cannot remove waste, fluids, and other substances from your blood the way they should. These substances can build up, which can worsen kidney damage and affect how your body works. Eating certain foods can lead to a buildup of these substances. Changing your diet can help prevent more kidney damage. Diet changes may also delay dialysis or even keep you from needing it. What nutrients should I limit? Work with your treatment team and a food expert (dietitian) to make a meal plan that's right for you. Foods you can eat and foods you should limit or avoid will depend on the stage of your kidney disease and any other health conditions you have. The items listed below are not a complete list. Talk with your dietitian to learn what is best for you. Potassium Potassium affects how steadily your heart beats. Too much potassium in your blood can cause an irregular heartbeat or even a heart attack. You may need to limit foods that are high in potassium, such as: Liquid milk and soy milk. Salt substitutes that contain potassium. Fruits like bananas, apricots, nectarines, melon, prunes, raisins, kiwi, and oranges. Vegetables, such as potatoes, sweet potatoes, yams, tomatoes, leafy greens, beets, avocado, pumpkin, and winter squash. Beans, like lima beans. Nuts. Phosphorus Phosphorus is a mineral found in your bones. You need a balance between calcium and phosphorus to build and maintain healthy bones. Too much added phosphorus from the foods you eat can pull calcium from your bones. Losing calcium can make your bones weak and more likely to break. Too much phosphorus can also make your skin itch. You may need to limit foods that are high in phosphorus or that have added phosphorus, such as: Liquid milk and dairy products. Dark-colored sodas or soft drinks. Bran cereals and  oatmeal. Protein  Protein helps you make and keep muscle. Protein also helps to repair your body's cells and tissues. One of the natural breakdown products of protein is a waste product called urea. When your kidneys are not working well, they cannot remove types of waste like urea. Reducing protein in your diet can help keep urea from building up in your blood. Depending on your stage of kidney disease, you may need to eat smaller portions of foods that are high in protein. Sources of animal protein include: Meat (all types). Fish and seafood. Poultry. Eggs. Dairy. Other protein foods include: Beans and legumes. Nuts and nut butter. Soy, like tofu.  Sodium Salt (sodium) helps to keep a healthy balance of fluids in your body. Too much salt can increase your blood pressure, which can harm your heart and lungs. Extra salt can also cause your body to keep too much fluid, making your kidneys work harder. You may need to limit or avoid foods that are high in salt, such as: Salt seasonings. Soy and teriyaki sauce. Packaged, precooked, cured, or processed meats, such as sausages or meat loaves. Sardines. Salted crackers and snack foods. Fast food. Canned soups and most canned foods. Pickled foods. Vegetable juice. Boxed mixes or ready-to-eat boxed meals and side dishes. Bottled dressings, sauces, and marinades. Talk with your dietitian about how much potassium, phosphorus, protein, and salt you may have each day. Helpful tips Read food labels  Check the amount of salt in foods. Limit foods that have salt or sodium listed among the first five ingredients. Try to eat low-salt foods. Check the ingredient list   for added phosphorus or potassium. "Phos" in an ingredient is a sign that phosphorus has been added. Do not buy foods that are calcium-enriched or that have calcium added to them (are fortified). Buy canned vegetables and beans that say "no salt added" and rinse them before  eating. Lifestyle Limit the amount of protein you eat from animal sources each day. Focus on protein from plant sources, like tofu and dried beans, peas, and lentils. Do not add salt to food when cooking or before eating. Do not eat star fruit. It can be toxic for people with kidney problems. Talk with your health care provider before taking any vitamin or mineral supplements. If told by your health care provider, track how much liquid you drink so you can avoid drinking too much. You may need to include foods you eat that are made mostly from water, like gelatin, ice cream, soups, and juicy fruits and vegetables. If you have diabetes: If you have diabetes (diabetes mellitus) and CKD, you need to keep your blood sugar (glucose) in the target range recommended by your health care provider. Follow your diabetes management plan. This may include: Checking your blood glucose regularly. Taking medicines by mouth, or taking insulin, or both. Exercising for at least 30 minutes on 5 or more days each week, or as told by your health care provider. Tracking how many servings of carbohydrates you eat at each meal. Not using orange juice to treat low blood sugars. Instead, use apple juice, cranberry juice, or clear soda. You may be given guidelines on what foods and nutrients you may eat, and how much you can have each day. This depends on your stage of kidney disease and whether you have high blood pressure (hypertension). Follow the meal plan your dietitian gives you. To learn more: National Institute of Diabetes and Digestive and Kidney Diseases: niddk.nih.gov National Kidney Foundation: kidney.org Summary Chronic kidney disease (CKD) is when your kidneys are not working well. They cannot remove waste, fluids, and other substances from your blood the way they should. These substances can build up, which can worsen kidney damage and affect how your body works. Changing your diet can help prevent more  kidney damage. Diet changes may also delay dialysis or even keep you from needing it. Diet changes are different for each person with CKD. Work with a dietitian to set up a meal plan that is right for you. This information is not intended to replace advice given to you by your health care provider. Make sure you discuss any questions you have with your health care provider. Document Revised: 03/04/2022 Document Reviewed: 03/09/2020 Elsevier Patient Education  2023 [employer]  

## 2022-12-20 DIAGNOSIS — M9905 Segmental and somatic dysfunction of pelvic region: Secondary | ICD-10-CM | POA: Diagnosis not present

## 2022-12-20 DIAGNOSIS — M5432 Sciatica, left side: Secondary | ICD-10-CM | POA: Diagnosis not present

## 2022-12-20 DIAGNOSIS — M5416 Radiculopathy, lumbar region: Secondary | ICD-10-CM | POA: Diagnosis not present

## 2022-12-20 DIAGNOSIS — M9903 Segmental and somatic dysfunction of lumbar region: Secondary | ICD-10-CM | POA: Diagnosis not present

## 2022-12-21 ENCOUNTER — Ambulatory Visit: Payer: 59 | Admitting: Nurse Practitioner

## 2022-12-22 ENCOUNTER — Ambulatory Visit: Payer: 59 | Admitting: Nurse Practitioner

## 2022-12-24 NOTE — Patient Instructions (Signed)
Diabetes Mellitus Basics  Diabetes mellitus, or diabetes, is a long-term (chronic) disease. It occurs when the body does not properly use sugar (glucose) that is released from food after you eat. Diabetes mellitus may be caused by one or both of these problems: Your pancreas does not make enough of a hormone called insulin. Your body does not react in a normal way to the insulin that it makes. Insulin lets glucose enter cells in your body. This gives you energy. If you have diabetes, glucose cannot get into cells. This causes high blood glucose (hyperglycemia). How to treat and manage diabetes You may need to take insulin or other diabetes medicines daily to keep your glucose in balance. If you are prescribed insulin, you will learn how to give yourself insulin by injection. You may need to adjust the amount of insulin you take based on the foods that you eat. You will need to check your blood glucose levels using a glucose monitor as told by your health care provider. The readings can help determine if you have low or high blood glucose. Generally, you should have these blood glucose levels: Before meals (preprandial): 80-130 mg/dL (4.4-7.2 mmol/L). After meals (postprandial): below 180 mg/dL (10 mmol/L). Hemoglobin A1c (HbA1c) level: less than 7%. Your health care provider will set treatment goals for you. Keep all follow-up visits. This is important. Follow these instructions at home: Diabetes medicines Take your diabetes medicines every day as told by your health care provider. List your diabetes medicines here: Name of medicine: ______________________________ Amount (dose): _______________ Time (a.m./p.m.): _______________ Notes: ___________________________________ Name of medicine: ______________________________ Amount (dose): _______________ Time (a.m./p.m.): _______________ Notes: ___________________________________ Name of medicine: ______________________________ Amount (dose):  _______________ Time (a.m./p.m.): _______________ Notes: ___________________________________ Insulin If you use insulin, list the types of insulin you use here: Insulin type: ______________________________ Amount (dose): _______________ Time (a.m./p.m.): _______________Notes: ___________________________________ Insulin type: ______________________________ Amount (dose): _______________ Time (a.m./p.m.): _______________ Notes: ___________________________________ Insulin type: ______________________________ Amount (dose): _______________ Time (a.m./p.m.): _______________ Notes: ___________________________________ Insulin type: ______________________________ Amount (dose): _______________ Time (a.m./p.m.): _______________ Notes: ___________________________________ Insulin type: ______________________________ Amount (dose): _______________ Time (a.m./p.m.): _______________ Notes: ___________________________________ Managing blood glucose  Check your blood glucose levels using a glucose monitor as told by your health care provider. Write down the times that you check your glucose levels here: Time: _______________ Notes: ___________________________________ Time: _______________ Notes: ___________________________________ Time: _______________ Notes: ___________________________________ Time: _______________ Notes: ___________________________________ Time: _______________ Notes: ___________________________________ Time: _______________ Notes: ___________________________________  Low blood glucose Low blood glucose (hypoglycemia) is when glucose is at or below 70 mg/dL (3.9 mmol/L). Symptoms may include: Feeling: Hungry. Sweaty and clammy. Irritable or easily upset. Dizzy. Sleepy. Having: A fast heartbeat. A headache. A change in your vision. Numbness around the mouth, lips, or tongue. Having trouble with: Moving (coordination). Sleeping. Treating low blood glucose To treat low blood  glucose, eat or drink something containing sugar right away. If you can think clearly and swallow safely, follow the 15:15 rule: Take 15 grams of a fast-acting carb (carbohydrate), as told by your health care provider. Some fast-acting carbs are: Glucose tablets: take 3-4 tablets. Hard candy: eat 3-5 pieces. Fruit juice: drink 4 oz (120 mL). Regular (not diet) soda: drink 4-6 oz (120-180 mL). Honey or sugar: eat 1 Tbsp (15 mL). Check your blood glucose levels 15 minutes after you take the carb. If your glucose is still at or below 70 mg/dL (3.9 mmol/L), take 15 grams of a carb again. If your glucose does not go above 70 mg/dL (3.9 mmol/L) after   3 tries, get help right away. After your glucose goes back to normal, eat a meal or a snack within 1 hour. Treating very low blood glucose If your glucose is at or below 54 mg/dL (3 mmol/L), you have very low blood glucose (severe hypoglycemia). This is an emergency. Do not wait to see if the symptoms will go away. Get medical help right away. Call your local emergency services (911 in the [place] Do not drive yourself to the hospital. Questions to ask your health care provider Should I talk with a diabetes educator? What equipment will I need to care for myself at home? What diabetes medicines do I need? When should I take them? How often do I need to check my blood glucose levels? What number can I call if I have questions? When is my follow-up visit? Where can I find a support group for people with diabetes? Where to find more information American Diabetes Association: www.diabetes.org Association of Diabetes Care and Education Specialists: www.diabeteseducator.org Contact a health care provider if: Your blood glucose is at or above 240 mg/dL (13.3 mmol/L) for 2 days in a row. You have been sick or have had a fever for 2 days or more, and you are not getting better. You have any of these problems for more than 6 hours: You cannot eat or  drink. You feel nauseous. You vomit. You have diarrhea. Get help right away if: Your blood glucose is lower than 54 mg/dL (3 mmol/L). You get confused. You have trouble thinking clearly. You have trouble breathing. These symptoms may represent a serious problem that is an emergency. Do not wait to see if the symptoms will go away. Get medical help right away. Call your local emergency services (911 in the [place] Do not drive yourself to the hospital. Summary Diabetes mellitus is a chronic disease that occurs when the body does not properly use sugar (glucose) that is released from food after you eat. Take insulin and diabetes medicines as told. Check your blood glucose every day, as often as told. Keep all follow-up visits. This is important. This information is not intended to replace advice given to you by your health care provider. Make sure you discuss any questions you have with your health care provider. Document Revised: 03/17/2020 Document Reviewed: 03/17/2020 Elsevier Patient Education  2023 [employer]  

## 2022-12-26 ENCOUNTER — Encounter: Payer: Self-pay | Admitting: Nurse Practitioner

## 2022-12-26 ENCOUNTER — Ambulatory Visit (INDEPENDENT_AMBULATORY_CARE_PROVIDER_SITE_OTHER): Payer: 59 | Admitting: Nurse Practitioner

## 2022-12-26 VITALS — BP 132/82 | HR 93 | Temp 98.2°F | Ht 64.02 in | Wt 203.5 lb

## 2022-12-26 DIAGNOSIS — E785 Hyperlipidemia, unspecified: Secondary | ICD-10-CM | POA: Diagnosis not present

## 2022-12-26 DIAGNOSIS — E790 Hyperuricemia without signs of inflammatory arthritis and tophaceous disease: Secondary | ICD-10-CM | POA: Diagnosis not present

## 2022-12-26 DIAGNOSIS — E6609 Other obesity due to excess calories: Secondary | ICD-10-CM

## 2022-12-26 DIAGNOSIS — I152 Hypertension secondary to endocrine disorders: Secondary | ICD-10-CM | POA: Diagnosis not present

## 2022-12-26 DIAGNOSIS — E1121 Type 2 diabetes mellitus with diabetic nephropathy: Secondary | ICD-10-CM | POA: Diagnosis not present

## 2022-12-26 DIAGNOSIS — G4733 Obstructive sleep apnea (adult) (pediatric): Secondary | ICD-10-CM | POA: Diagnosis not present

## 2022-12-26 DIAGNOSIS — N183 Chronic kidney disease, stage 3 unspecified: Secondary | ICD-10-CM

## 2022-12-26 DIAGNOSIS — E1122 Type 2 diabetes mellitus with diabetic chronic kidney disease: Secondary | ICD-10-CM | POA: Diagnosis not present

## 2022-12-26 DIAGNOSIS — E042 Nontoxic multinodular goiter: Secondary | ICD-10-CM

## 2022-12-26 DIAGNOSIS — M85852 Other specified disorders of bone density and structure, left thigh: Secondary | ICD-10-CM | POA: Diagnosis not present

## 2022-12-26 DIAGNOSIS — M059 Rheumatoid arthritis with rheumatoid factor, unspecified: Secondary | ICD-10-CM | POA: Diagnosis not present

## 2022-12-26 DIAGNOSIS — E559 Vitamin D deficiency, unspecified: Secondary | ICD-10-CM

## 2022-12-26 DIAGNOSIS — E1169 Type 2 diabetes mellitus with other specified complication: Secondary | ICD-10-CM

## 2022-12-26 DIAGNOSIS — Z79899 Other long term (current) drug therapy: Secondary | ICD-10-CM

## 2022-12-26 DIAGNOSIS — E1159 Type 2 diabetes mellitus with other circulatory complications: Secondary | ICD-10-CM | POA: Diagnosis not present

## 2022-12-26 DIAGNOSIS — K219 Gastro-esophageal reflux disease without esophagitis: Secondary | ICD-10-CM

## 2022-12-26 DIAGNOSIS — Z6832 Body mass index (BMI) 32.0-32.9, adult: Secondary | ICD-10-CM

## 2022-12-26 LAB — BAYER DCA HB A1C WAIVED: HB A1C (BAYER DCA - WAIVED): 5.4 % (ref 4.8–5.6)

## 2022-12-26 LAB — MICROALBUMIN, URINE WAIVED
Creatinine, Urine Waived: 50 mg/dL (ref 10–300)
Microalb, Ur Waived: 10 mg/L (ref 0–19)

## 2022-12-26 MED ORDER — OMEPRAZOLE 20 MG PO CPDR: 20.0000 mg | DELAYED_RELEASE_CAPSULE | Freq: Every day | ORAL | 4 refills | Status: DC

## 2022-12-26 NOTE — Assessment & Plan Note (Signed)
Ongoing and improved.  Diagnosed on 01/20/22, continue collaboration with rheumatology and Plaquenil as ordered by them, she is noticing improved symptoms.  Recommend massage therapy added to regimen and continue to visit with chiropractor.

## 2022-12-26 NOTE — Assessment & Plan Note (Signed)
Chronic, ongoing.  Continue Telmisartan for kidney protection.  Labs today.  Continue collaboration with nephrology, appreciate their input.  Recent note reviewed.

## 2022-12-26 NOTE — Assessment & Plan Note (Signed)
Chronic, ongoing.  Continue current medication regimen and adjust as needed. Lipid panel today. 

## 2022-12-26 NOTE — Assessment & Plan Note (Signed)
Ongoing with underlying osteopenia, continue daily supplement and check Vit D level today.

## 2022-12-26 NOTE — Progress Notes (Signed)
BP 132/82 (BP Location: Left Arm, Patient Position: Sitting, Cuff Size: Normal)   Pulse 93   Temp 98.2 F (36.8 C) (Oral)   Ht 5' 4.02" (1.626 m)   Wt 203 lb 8 oz (92.3 kg)   LMP  (LMP Unknown)   SpO2 98%   BMI 34.91 kg/m    Subjective:    Patient ID: Kim Thomas, female    DOB: [birth year], 67 y.o.   MRN: [id number]  HPI: Kim Thomas is a 67 y.o. female  Chief Complaint  Patient presents with   Diabetes   Hypertension   Hyperlipidemia   Gastroesophageal Reflux    Would like to have her omeprazole refilled   Chronic Kidney Disease   RA   Osteopenia   DIABETES A1c in October 5.1%.  Continues on Ozempic 1 MG weekly.  Continues to maintain weight loss.  We stopped Jardiance in 2022 due to stable A1c.  She reports struggling with sweets, enjoys these -- was doing good for awhile and started reintroducing them into diet.  Will eat a whole sleeve of crackers in one sitting, but has stopped this.     Taking Gabapentin 400 MG BID for neuropathy.  Ordered TID and if having a bad day will take the 3rd dose. Hypoglycemic episodes: no Polydipsia/polyuria: no Visual disturbance: no Chest pain: no Paresthesias: no Glucose Monitoring: yes             Accucheck frequency: occasionally             Fasting glucose: 100 to 110             Post prandial:             Evening:              Before meals:  Taking Insulin?: no             Long acting insulin:             Short acting insulin: Blood Pressure Monitoring: not checking Retinal Examination: Up To Date Foot Exam: Up to Date Pneumovax: Up To Date Influenza: refuses Aspirin: yes  HYPERTENSION / HYPERLIPIDEMIA Continues on Atorvastatin 20 MG daily and Telmisartan 20 MG.  Satisfied with current treatment? yes Duration of hypertension: chronic BP monitoring frequency: occasionally BP range: 124/62 this morning BP medication side effects: no Duration of hyperlipidemia: chronic Cholesterol medication side effects:  no Cholesterol supplements: none Medication compliance: good compliance Aspirin: yes Recent stressors: no Recurrent headaches: no Visual changes: no Palpitations: no Dyspnea: no Chest pain: no Lower extremity edema: no Dizzy/lightheaded: no The 10-year ASCVD risk score (Arnett DK, et al., 2019) is: 15.9%   Values used to calculate the score:     Age: 74 years     Sex: Female     Is Non-Hispanic African American: Yes     Diabetic: Yes     Tobacco smoker: No     Systolic Blood Pressure: 132 mmHg     Is BP treated: Yes     HDL Cholesterol: 69 mg/dL     Total Cholesterol: 134 mg/dL   CHRONIC KIDNEY DISEASE Visits with nephrology, Dr. Thedore Mins, last on 08/18/22.   CKD status: stable Medications renally dose: yes Previous renal evaluation: yes Pneumovax:  Up To Date Influenza Vaccine:  Refuses  GERD Taking Omeprazole daily. GERD control status: stable Satisfied with current treatment? yes Heartburn frequency: not as long as taking medication Medication side effects: no  Medication compliance: stable Dysphagia: no  Odynophagia:  no Hematemesis: no Blood in stool: no EGD: yes   OSTEOPENIA Last DEXA in 2021 with T-score -2.2. Satisfied with current treatment?: yes Adequate calcium & vitamin D: yes Weight bearing exercises: yes    RHEUMATOID ARTHRITIS Last rheumatology visit on 10/19/22, continues Plaquenil.  Reports ongoing improvement in pain with no further hand pain.  History of elevation in uric acid levels, no current medications. Duration: months Pain: no Context:  better Decreased function/range of motion: improved Erythema: no Swelling: no Heat or warmth: no Morning stiffness: improved Aggravating factors: none Alleviating factors: Plaquenil Relief with NSAIDs?: No NSAIDs Taken Treatments attempted:  Parafin wax, Tylenol, Plaquenil, CBD cream Involved Joints:     Hands: yes bilateral    Wrists: none    Elbows: none    Shoulders: none    Back: yes      Hips: none    Knees: none    Ankles: none    Feet: none   Relevant past medical, surgical, family and social history reviewed and updated as indicated. Interim medical history since our last visit reviewed. Allergies and medications reviewed and updated.  Review of Systems  Constitutional:  Negative for activity change, appetite change, diaphoresis, fatigue and fever.  Respiratory:  Negative for cough, chest tightness and shortness of breath.   Cardiovascular:  Negative for chest pain, palpitations and leg swelling.  Gastrointestinal: Negative.   Endocrine: Negative for cold intolerance, heat intolerance, polydipsia, polyphagia and polyuria.  Neurological: Negative.   Psychiatric/Behavioral: Negative.     Per HPI unless specifically indicated above     Objective:    BP 132/82 (BP Location: Left Arm, Patient Position: Sitting, Cuff Size: Normal)   Pulse 93   Temp 98.2 F (36.8 C) (Oral)   Ht 5' 4.02" (1.626 m)   Wt 203 lb 8 oz (92.3 kg)   LMP  (LMP Unknown)   SpO2 98%   BMI 34.91 kg/m   Wt Readings from Last 3 Encounters:  12/26/22 203 lb 8 oz (92.3 kg)  10/19/22 198 lb (89.8 kg)  09/20/22 191 lb 8 oz (86.9 kg)    Physical Exam Vitals and nursing note reviewed.  Constitutional:      General: She is awake. She is not in acute distress.    Appearance: She is well-developed. She is obese. She is not ill-appearing.  HENT:     Head: Normocephalic.     Right Ear: Hearing normal. No drainage.     Left Ear: Hearing normal. No drainage.  Eyes:     General: Lids are normal.        Right eye: No discharge.        Left eye: No discharge.     Conjunctiva/sclera: Conjunctivae normal.     Pupils: Pupils are equal, round, and reactive to light.  Neck:     Vascular: No carotid bruit.  Cardiovascular:     Rate and Rhythm: Normal rate and regular rhythm.     Heart sounds: Normal heart sounds. No murmur heard.    No gallop.  Pulmonary:     Effort: Pulmonary effort is normal. No  accessory muscle usage or respiratory distress.     Breath sounds: Normal breath sounds.  Abdominal:     General: Bowel sounds are normal.     Palpations: Abdomen is soft.  Musculoskeletal:     Cervical back: Normal range of motion and neck supple.     Right lower leg: No edema.     Left lower  leg: No edema.  Skin:    General: Skin is warm and dry.  Neurological:     Mental Status: She is alert and oriented to person, place, and time.  Psychiatric:        Attention and Perception: Attention normal.        Mood and Affect: Mood normal.        Speech: Speech normal.        Behavior: Behavior normal. Behavior is cooperative.        Thought Content: Thought content normal.    Results for orders placed or performed in visit on 10/19/22  Sedimentation rate  Result Value Ref Range   Sed Rate 9 0 - 30 mm/h  C-reactive protein  Result Value Ref Range   CRP 1.5 <8.0 mg/L  COMPLETE METABOLIC PANEL WITH GFR  Result Value Ref Range   Glucose, Bld 80 65 - 99 mg/dL   BUN 15 7 - 25 mg/dL   Creat 1.01 0.50 - 1.05 mg/dL   eGFR 61 > OR = 60 mL/min/1.55m2   BUN/Creatinine Ratio SEE NOTE: 6 - 22 (calc)   Sodium 141 135 - 146 mmol/L   Potassium 4.2 3.5 - 5.3 mmol/L   Chloride 106 98 - 110 mmol/L   CO2 28 20 - 32 mmol/L   Calcium 9.1 8.6 - 10.4 mg/dL   Total Protein 7.0 6.1 - 8.1 g/dL   Albumin 3.9 3.6 - 5.1 g/dL   Globulin 3.1 1.9 - 3.7 g/dL (calc)   AG Ratio 1.3 1.0 - 2.5 (calc)   Total Bilirubin 0.5 0.2 - 1.2 mg/dL   Alkaline phosphatase (APISO) 65 37 - 153 U/L   AST 24 10 - 35 U/L   ALT 17 6 - 29 U/L      Assessment & Plan:   Problem List Items Addressed This Visit       Cardiovascular and Mediastinum   Hypertension associated with diabetes (HCC)    Chronic, ongoing.  BP stable in office today and even better on home readings.  Recommend she check BP at home three mornings a week and document for provider.  Continue current medication regimen and adjust as needed.  LABS: CBC,  CMP, TSH, urine ALB.  Educated her on Telmisartan and kidney benefit.  Urine micro alb 07 December 2022.  Focus on DASH diet at home.  Return in 3 months.      Relevant Orders   Bayer DCA Hb A1c Waived   Microalbumin, Urine Waived   TSH     Respiratory   Sleep apnea     Digestive   GERD without esophagitis    Chronic, ongoing. Continue current medication regimen and adjust as needed.  Risks of PPI use were discussed with patient including bone loss, C. Diff diarrhea, pneumonia, infections, CKD, electrolyte abnormalities.  Verbalizes understanding and chooses to continue the medication. Mag level today.       Relevant Medications   omeprazole (PRILOSEC) 20 MG capsule   Other Relevant Orders   Magnesium     Endocrine   CKD stage 3 due to type 2 diabetes mellitus (HCC)    Chronic, ongoing.  Continue Telmisartan for kidney protection.  Labs today.  Continue collaboration with nephrology, appreciate their input.  Recent note reviewed.      Relevant Orders   Bayer DCA Hb A1c Waived   Microalbumin, Urine Waived   CBC with Differential/Platelet   Comprehensive metabolic panel   Hyperlipidemia associated with type 2 diabetes mellitus (  HCC)    Chronic, ongoing.  Continue current medication regimen and adjust as needed.  Lipid panel today.      Relevant Orders   Bayer DCA Hb A1c Waived   Comprehensive metabolic panel   Lipid Panel w/o Chol/HDL Ratio   Multinodular thyroid    Recent imaging noted no need for further imaging.  Will continue monitoring TSH annually, check today.      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  A1c 5.4% today, praised for ongoing success with A1c in normal range now.  Urine ALB 10 (January 2024), continue Telmisartan for kidney protection.  Continue Ozempic 1 MG weekly which is offering ongoing benefit to diabetes and weight.  Continue off Jardiance as is tolerating this. Continue  increased diet focus and monitoring BS twice a day.  Recommend focus on healthy weight loss and exercise daily.  Have recommended if any consistent lows <70 immediately alert provider so can adjust medication.  Return in 3 months.      Relevant Orders   Bayer DCA Hb A1c Waived   Microalbumin, Urine Waived     Musculoskeletal and Integument   Osteopenia of neck of left femur    Ongoing.  Noted on recent DEXA -- continue supplements daily and check Vit D level today.  Plan for repeat DEXA in 2026.  Discussed fall prevention.        Relevant Orders   VITAMIN D 25 Hydroxy (Vit-D Deficiency, Fractures)   Seropositive rheumatoid arthritis (HCC)    Ongoing and improved.  Diagnosed on 01/20/22, continue collaboration with rheumatology and Plaquenil as ordered by them, she is noticing improved symptoms.  Recommend massage therapy added to regimen and continue to visit with chiropractor.        Other   Elevated uric acid in blood    History of elevations, recheck today.  No current medications.      Relevant Orders   Uric acid   Obesity    BMI 34.91 with T2DM, HTN/HLD.  Recommended eating smaller high protein, low fat meals more frequently and exercising 30 mins a day 5 times a week with a goal of 10-15lb weight loss in the next 3 months. Patient voiced their understanding and motivation to adhere to these recommendations.       Vitamin D deficiency    Ongoing with underlying osteopenia, continue daily supplement and check Vit D level today.      Relevant Orders   VITAMIN D 25 Hydroxy (Vit-D Deficiency, Fractures)     Follow up plan: Return in about 3 months (around 03/27/2023) for T2DM, HTN/HLD, RA, CKD.

## 2022-12-26 NOTE — Assessment & Plan Note (Signed)
Chronic, ongoing.  BP stable in office today and even better on home readings.  Recommend she check BP at home three mornings a week and document for provider.  Continue current medication regimen and adjust as needed.  LABS: CBC, CMP, TSH, urine ALB.  Educated her on Telmisartan and kidney benefit.  Urine micro alb 07 December 2022.  Focus on DASH diet at home.  Return in 3 months.

## 2022-12-26 NOTE — Assessment & Plan Note (Addendum)
Chronic, ongoing. Continue current medication regimen and adjust as needed.  Risks of PPI use were discussed with patient including bone loss, C. Diff diarrhea, pneumonia, infections, CKD, electrolyte abnormalities.  Verbalizes understanding and chooses to continue the medication. Mag level today.

## 2022-12-26 NOTE — Assessment & Plan Note (Signed)
Chronic, ongoing.  A1c 5.4% today, praised for ongoing success with A1c in normal range now.  Urine ALB 10 (January 2024), continue Telmisartan for kidney protection.  Continue Ozempic 1 MG weekly which is offering ongoing benefit to diabetes and weight.  Continue off Jardiance as is tolerating this. Continue increased diet focus and monitoring BS twice a day.  Recommend focus on healthy weight loss and exercise daily.  Have recommended if any consistent lows <70 immediately alert provider so can adjust medication.  Return in 3 months.

## 2022-12-26 NOTE — Assessment & Plan Note (Signed)
BMI 34.91 with T2DM, HTN/HLD.  Recommended eating smaller high protein, low fat meals more frequently and exercising 30 mins a day 5 times a week with a goal of 10-15lb weight loss in the next 3 months. Patient voiced their understanding and motivation to adhere to these recommendations.

## 2022-12-26 NOTE — Assessment & Plan Note (Signed)
History of elevations, recheck today.  No current medications.

## 2022-12-26 NOTE — Assessment & Plan Note (Signed)
Ongoing.  Noted on recent DEXA -- continue supplements daily and check Vit D level today.  Plan for repeat DEXA in 2026.  Discussed fall prevention.

## 2022-12-26 NOTE — Assessment & Plan Note (Signed)
Recent imaging noted no need for further imaging.  Will continue monitoring TSH annually, check today.

## 2022-12-27 LAB — COMPREHENSIVE METABOLIC PANEL
ALT: 17 IU/L (ref 0–32)
AST: 26 IU/L (ref 0–40)
Albumin/Globulin Ratio: 1.5 (ref 1.2–2.2)
Albumin: 4.1 g/dL (ref 3.9–4.9)
Alkaline Phosphatase: 77 IU/L (ref 44–121)
BUN/Creatinine Ratio: 12 (ref 12–28)
BUN: 13 mg/dL (ref 8–27)
Bilirubin Total: 0.4 mg/dL (ref 0.0–1.2)
CO2: 22 mmol/L (ref 20–29)
Calcium: 9 mg/dL (ref 8.7–10.3)
Chloride: 104 mmol/L (ref 96–106)
Creatinine, Ser: 1.13 mg/dL — ABNORMAL HIGH (ref 0.57–1.00)
Globulin, Total: 2.7 g/dL (ref 1.5–4.5)
Glucose: 76 mg/dL (ref 70–99)
Potassium: 4 mmol/L (ref 3.5–5.2)
Sodium: 140 mmol/L (ref 134–144)
Total Protein: 6.8 g/dL (ref 6.0–8.5)
eGFR: 54 mL/min/{1.73_m2} — ABNORMAL LOW (ref >59)

## 2022-12-27 LAB — CBC WITH DIFFERENTIAL/PLATELET
Basophils Absolute: 0.1 10*3/uL (ref 0.0–0.2)
Basos: 1 %
EOS (ABSOLUTE): 0.1 10*3/uL (ref 0.0–0.4)
Eos: 2 %
Hematocrit: 37.1 % (ref 34.0–46.6)
Hemoglobin: 12.1 g/dL (ref 11.1–15.9)
Immature Grans (Abs): 0 10*3/uL (ref 0.0–0.1)
Immature Granulocytes: 0 %
Lymphocytes Absolute: 1.9 10*3/uL (ref 0.7–3.1)
Lymphs: 41 %
MCH: 29.4 pg (ref 26.6–33.0)
MCHC: 32.6 g/dL (ref 31.5–35.7)
MCV: 90 fL (ref 79–97)
Monocytes Absolute: 0.4 10*3/uL (ref 0.1–0.9)
Monocytes: 10 %
Neutrophils Absolute: 2.1 10*3/uL (ref 1.4–7.0)
Neutrophils: 46 %
Platelets: 219 10*3/uL (ref 150–450)
RBC: 4.12 x10E6/uL (ref 3.77–5.28)
RDW: 12.6 % (ref 11.7–15.4)
WBC: 4.5 10*3/uL (ref 3.4–10.8)

## 2022-12-27 LAB — LIPID PANEL W/O CHOL/HDL RATIO
Cholesterol, Total: 146 mg/dL (ref 100–199)
HDL: 78 mg/dL (ref >39)
LDL Chol Calc (NIH): 52 mg/dL (ref 0–99)
Triglycerides: 86 mg/dL (ref 0–149)
VLDL Cholesterol Cal: 16 mg/dL (ref 5–40)

## 2022-12-27 LAB — TSH: TSH: 0.713 u[IU]/mL (ref 0.450–4.500)

## 2022-12-27 LAB — MAGNESIUM: Magnesium: 1.7 mg/dL (ref 1.6–2.3)

## 2022-12-27 LAB — VITAMIN D 25 HYDROXY (VIT D DEFICIENCY, FRACTURES): Vit D, 25-Hydroxy: 37.3 ng/mL (ref 30.0–100.0)

## 2022-12-27 LAB — URIC ACID: Uric Acid: 6.1 mg/dL (ref 3.0–7.2)

## 2022-12-27 NOTE — Progress Notes (Signed)
Contacted via [place] afternoon Kim Thomas, your labs have returned and overall are nice and normal with exception of mild kidney disease present, creatinine and eGFR.  Everything else looks great!!!  Any questions?  Continue good hydration. Keep being amazing!!  Thank you for allowing me to participate in your care.  I appreciate you. Kindest regards, Temperance Kelemen

## 2023-01-09 ENCOUNTER — Telehealth: Payer: Self-pay

## 2023-01-09 DIAGNOSIS — H40013 Open angle with borderline findings, low risk, bilateral: Secondary | ICD-10-CM | POA: Diagnosis not present

## 2023-01-09 LAB — HM DIABETES EYE EXAM

## 2023-01-09 NOTE — Telephone Encounter (Signed)
PA approved fro Ozempic 16m/3ml

## 2023-01-10 ENCOUNTER — Encounter: Payer: Self-pay | Admitting: Nurse Practitioner

## 2023-01-10 DIAGNOSIS — M9905 Segmental and somatic dysfunction of pelvic region: Secondary | ICD-10-CM | POA: Diagnosis not present

## 2023-01-10 DIAGNOSIS — M5432 Sciatica, left side: Secondary | ICD-10-CM | POA: Diagnosis not present

## 2023-01-10 DIAGNOSIS — M5416 Radiculopathy, lumbar region: Secondary | ICD-10-CM | POA: Diagnosis not present

## 2023-01-10 DIAGNOSIS — M9903 Segmental and somatic dysfunction of lumbar region: Secondary | ICD-10-CM | POA: Diagnosis not present

## 2023-01-24 ENCOUNTER — Other Ambulatory Visit: Payer: Self-pay | Admitting: Nurse Practitioner

## 2023-01-25 NOTE — Telephone Encounter (Signed)
Requested Prescriptions  Pending Prescriptions Disp Refills   OZEMPIC, 1 MG/DOSE, 4 MG/3ML SOPN [Pharmacy Med Name: Ozempic (1 MG/DOSE) 4 MG/3ML Subcutaneous Solution Pen-injector] 9 mL 0    Sig: INJECT SUBCUTANEOUSLY 1 MG EVERY WEEK AS DIRECTED     Endocrinology:  Diabetes - GLP-1 Receptor Agonists - semaglutide Failed - 01/24/2023 10:47 PM      Failed - Cr in normal range and within 360 days    Creat  Date Value Ref Range Status  10/19/2022 1.01 0.50 - 1.05 mg/dL Final   Creatinine, Ser  Date Value Ref Range Status  12/26/2022 1.13 (H) 0.57 - 1.00 mg/dL Final         Passed - HBA1C in normal range and within 180 days    Hemoglobin A1C  Date Value Ref Range Status  03/17/2016 7.2  Final   HB A1C (BAYER DCA - WAIVED)  Date Value Ref Range Status  12/26/2022 5.4 4.8 - 5.6 % Final    Comment:             Prediabetes: 5.7 - 6.4          Diabetes: >6.4          Glycemic control for adults with diabetes: <7.0          Passed - Valid encounter within last 6 months    Recent Outpatient Visits           1 month ago Type 2 diabetes mellitus with diabetic nephropathy, without long-term current use of insulin ([place])   [place] McBride, Walker T, NP   4 months ago Type 2 diabetes mellitus with diabetic nephropathy, without long-term current use of insulin ([place])   [place], [place] T, NP   8 months ago Type 2 diabetes mellitus with diabetic nephropathy, without long-term current use of insulin ([place])   [place], [place] T, NP   11 months ago Type 2 diabetes mellitus with diabetic nephropathy, without long-term current use of insulin ([place])   [place], Henrine Screws T, NP   1 year ago Chronic inflammatory arthritis   [place], [name], NP       Future Appointments             In 2 months Cannady, [name], NP [place], [place]   In 2 months Rice, Resa Miner, MD [facility] Rheumatology   In 6 months  [place], [facility]

## 2023-02-06 ENCOUNTER — Telehealth: Payer: Self-pay | Admitting: Nurse Practitioner

## 2023-02-06 NOTE — Telephone Encounter (Signed)
Pt is calling to get an understanding of OV 12/26/22 pt was billed $122.93. Pt called the billing dept and was advised that she had not met her deductable. Pt reports that she does not have a deductable. Please advise [facility]- [phone]

## 2023-02-10 ENCOUNTER — Other Ambulatory Visit: Payer: Self-pay | Admitting: Nurse Practitioner

## 2023-02-10 NOTE — Telephone Encounter (Signed)
Requested Prescriptions  Pending Prescriptions Disp Refills   atorvastatin (LIPITOR) 20 MG tablet [Pharmacy Med Name: Atorvastatin Calcium 20 MG Oral Tablet] 100 tablet 0    Sig: TAKE 1 TABLET BY MOUTH ONCE  DAILY     Cardiovascular:  Antilipid - Statins Failed - 02/10/2023  5:58 AM      Failed - Lipid Panel in normal range within the last 12 months    Cholesterol, Total  Date Value Ref Range Status  12/26/2022 146 100 - 199 mg/dL Final   Cholesterol Piccolo, Waived  Date Value Ref Range Status  05/28/2019 150 <200 mg/dL Final    Comment:                            Desirable                <200                         Borderline High      200- 239                         High                     >239    LDL Chol Calc (NIH)  Date Value Ref Range Status  12/26/2022 52 0 - 99 mg/dL Final   HDL  Date Value Ref Range Status  12/26/2022 78 >39 mg/dL Final   Triglycerides  Date Value Ref Range Status  12/26/2022 86 0 - 149 mg/dL Final   Triglycerides Piccolo,Waived  Date Value Ref Range Status  05/28/2019 92 <150 mg/dL Final    Comment:                            Normal                   <150                         Borderline High     150 - 199                         High                200 - 499                         Very High                >499          Passed - Patient is not pregnant      Passed - Valid encounter within last 12 months    Recent Outpatient Visits           1 month ago Type 2 diabetes mellitus with diabetic nephropathy, without long-term current use of insulin ([place])   [place], Carpenter T, NP   4 months ago Type 2 diabetes mellitus with diabetic nephropathy, without long-term current use of insulin ([place])   [place], [place] T, NP   8 months ago Type 2 diabetes mellitus with diabetic nephropathy, without long-term current use of insulin ([place])   [place]  Marnee Guarneri T, NP   1 year ago Type 2 diabetes mellitus with diabetic nephropathy, without long-term current use of insulin ([place])   [place], [place] T, NP   1 year ago Chronic inflammatory arthritis   [place], [name], NP       Future Appointments             In 1 month Cannady, [name], NP [place], [facility]   In 2 months Rice, Resa Miner, MD [facility] Rheumat[facility]   In 6 months  [place], [facility]

## 2023-02-17 NOTE — Telephone Encounter (Signed)
Patient spoke with billing department and insurance company. Patient's balance of $122.93 will be filed with [facility] for additional payment. Patient acknowledged understanding.

## 2023-03-06 ENCOUNTER — Other Ambulatory Visit: Payer: Self-pay | Admitting: Nurse Practitioner

## 2023-03-06 DIAGNOSIS — Z1231 Encounter for screening mammogram for malignant neoplasm of breast: Secondary | ICD-10-CM

## 2023-03-21 DIAGNOSIS — M9905 Segmental and somatic dysfunction of pelvic region: Secondary | ICD-10-CM | POA: Diagnosis not present

## 2023-03-21 DIAGNOSIS — M5432 Sciatica, left side: Secondary | ICD-10-CM | POA: Diagnosis not present

## 2023-03-21 DIAGNOSIS — M5416 Radiculopathy, lumbar region: Secondary | ICD-10-CM | POA: Diagnosis not present

## 2023-03-21 DIAGNOSIS — M9903 Segmental and somatic dysfunction of lumbar region: Secondary | ICD-10-CM | POA: Diagnosis not present

## 2023-03-24 ENCOUNTER — Other Ambulatory Visit: Payer: Self-pay | Admitting: Nurse Practitioner

## 2023-03-24 ENCOUNTER — Ambulatory Visit
Admission: RE | Admit: 2023-03-24 | Discharge: 2023-03-24 | Disposition: A | Discharge status: Home / Self Care | Payer: 59 | Source: Ambulatory Visit | Attending: Nurse Practitioner | Admitting: Nurse Practitioner

## 2023-03-24 DIAGNOSIS — Z1231 Encounter for screening mammogram for malignant neoplasm of breast: Secondary | ICD-10-CM | POA: Diagnosis not present

## 2023-03-27 ENCOUNTER — Ambulatory Visit: Payer: 59 | Admitting: Nurse Practitioner

## 2023-03-27 NOTE — Telephone Encounter (Signed)
Requested Prescriptions  Pending Prescriptions Disp Refills   OZEMPIC, 1 MG/DOSE, 4 MG/3ML SOPN [Pharmacy Med Name: Ozempic (1 MG/DOSE) 4 MG/3ML Subcutaneous Solution Pen-injector] 9 mL 0    Sig: INJECT SUBCUTANEOUSLY 1 MG EVERY WEEK AS DIRECTED     Endocrinology:  Diabetes - GLP-1 Receptor Agonists - semaglutide Failed - 03/24/2023 10:37 PM      Failed - Cr in normal range and within 360 days    Creat  Date Value Ref Range Status  10/19/2022 1.01 0.50 - 1.05 mg/dL Final   Creatinine, Ser  Date Value Ref Range Status  12/26/2022 1.13 (H) 0.57 - 1.00 mg/dL Final         Passed - HBA1C in normal range and within 180 days    Hemoglobin A1C  Date Value Ref Range Status  03/17/2016 7.2  Final   HB A1C (BAYER DCA - WAIVED)  Date Value Ref Range Status  12/26/2022 5.4 4.8 - 5.6 % Final    Comment:             Prediabetes: 5.7 - 6.4          Diabetes: >6.4          Glycemic control for adults with diabetes: <7.0          Passed - Valid encounter within last 6 months    Recent Outpatient Visits           3 months ago Type 2 diabetes mellitus with diabetic nephropathy, without long-term current use of insulin (HCC)   [place] [facility] [place], [place] T, NP   6 months ago Type 2 diabetes mellitus with diabetic nephropathy, without long-term current use of insulin (HCC)   [place] [facility] [place], [place] T, NP   10 months ago Type 2 diabetes mellitus with diabetic nephropathy, without long-term current use of insulin (HCC)   [place] [facility] [place], [place] T, NP   1 year ago Type 2 diabetes mellitus with diabetic nephropathy, without long-term current use of insulin (HCC)   [place] [facility] [place], Corrie Dandy T, NP   1 year ago Chronic inflammatory arthritis   [place] [facility] [place], Dorie Rank, NP       Future Appointments             In 3 weeks Rice, Jamesetta [place], MD [facility]  Rheumatology   In 3 weeks Cannady, Dorie Rank, NP [place] [facility], [facility]   In 4 months   [facility], [facility]

## 2023-03-27 NOTE — Progress Notes (Signed)
Contacted via MyChart   Normal mammogram, may repeat in one year:)

## 2023-04-15 ENCOUNTER — Other Ambulatory Visit: Payer: Self-pay | Admitting: Internal Medicine

## 2023-04-15 DIAGNOSIS — M79641 Pain in right hand: Secondary | ICD-10-CM

## 2023-04-15 DIAGNOSIS — R768 Other specified abnormal immunological findings in serum: Secondary | ICD-10-CM

## 2023-04-15 DIAGNOSIS — M059 Rheumatoid arthritis with rheumatoid factor, unspecified: Secondary | ICD-10-CM

## 2023-04-16 NOTE — Patient Instructions (Signed)
Be Involved in Your Health Care:  Taking Medications When medications are taken as directed, they can greatly improve your health. But if they are not taken as instructed, they may not work. In some cases, not taking them correctly can be harmful. To help ensure your treatment remains effective and safe, understand your medications and how to take them.  Your lab results, notes and after visit summary will be available on My Chart. We strongly encourage you to use this feature. If lab results are abnormal the clinic will contact you with the appropriate steps. If the clinic does not contact you assume the results are satisfactory. You can always see your results on My Chart. If you have questions regarding your condition, please contact the clinic during office hours. You can also ask questions on My Chart.  We at [facility] are grateful that you chose us to provide care. We strive to provide excellent and compassionate care and are always looking for feedback. If you get a survey from the clinic please complete this.   Food Basics for Chronic Kidney Disease Chronic kidney disease (CKD) is when your kidneys are not working well. They cannot remove waste, fluids, and other substances from your blood the way they should. These substances can build up, which can worsen kidney damage and affect how your body works. Eating certain foods can lead to a buildup of these substances. Changing your diet can help prevent more kidney damage. Diet changes may also delay dialysis or even keep you from needing it. What nutrients should I limit? Work with your treatment team and a food expert (dietitian) to make a meal plan that's right for you. Foods you can eat and foods you should limit or avoid will depend on the stage of your kidney disease and any other health conditions you have. The items listed below are not a complete list. Talk with your dietitian to learn what is best for  you. Potassium Potassium affects how steadily your heart beats. Too much potassium in your blood can cause an irregular heartbeat or even a heart attack. You may need to limit foods that are high in potassium, such as: Liquid milk and soy milk. Salt substitutes that contain potassium. Fruits like bananas, apricots, nectarines, melon, prunes, raisins, kiwi, and oranges. Vegetables, such as potatoes, sweet potatoes, yams, tomatoes, leafy greens, beets, avocado, pumpkin, and winter squash. Beans, like lima beans. Nuts. Phosphorus Phosphorus is a mineral found in your bones. You need a balance between calcium and phosphorus to build and maintain healthy bones. Too much added phosphorus from the foods you eat can pull calcium from your bones. Losing calcium can make your bones weak and more likely to break. Too much phosphorus can also make your skin itch. You may need to limit foods that are high in phosphorus or that have added phosphorus, such as: Liquid milk and dairy products. Dark-colored sodas or soft drinks. Bran cereals and oatmeal. Protein  Protein helps you make and keep muscle. Protein also helps to repair your body's cells and tissues. One of the natural breakdown products of protein is a waste product called urea. When your kidneys are not working well, they cannot remove types of waste like urea. Reducing protein in your diet can help keep urea from building up in your blood. Depending on your stage of kidney disease, you may need to eat smaller portions of foods that are high in protein. Sources of animal protein include: Meat (all types). Fish and   seafood. Poultry. Eggs. Dairy. Other protein foods include: Beans and legumes. Nuts and nut butter. Soy, like tofu.  Sodium Salt (sodium) helps to keep a healthy balance of fluids in your body. Too much salt can increase your blood pressure, which can harm your heart and lungs. Extra salt can also cause your body to keep too much  fluid, making your kidneys work harder. You may need to limit or avoid foods that are high in salt, such as: Salt seasonings. Soy and teriyaki sauce. Packaged, precooked, cured, or processed meats, such as sausages or meat loaves. Sardines. Salted crackers and snack foods. Fast food. Canned soups and most canned foods. Pickled foods. Vegetable juice. Boxed mixes or ready-to-eat boxed meals and side dishes. Bottled dressings, sauces, and marinades. Talk with your dietitian about how much potassium, phosphorus, protein, and salt you may have each day. Helpful tips Read food labels  Check the amount of salt in foods. Limit foods that have salt or sodium listed among the first five ingredients. Try to eat low-salt foods. Check the ingredient list for added phosphorus or potassium. "Phos" in an ingredient is a sign that phosphorus has been added. Do not buy foods that are calcium-enriched or that have calcium added to them (are fortified). Buy canned vegetables and beans that say "no salt added" and rinse them before eating. Lifestyle Limit the amount of protein you eat from animal sources each day. Focus on protein from plant sources, like tofu and dried beans, peas, and lentils. Do not add salt to food when cooking or before eating. Do not eat star fruit. It can be toxic for people with kidney problems. Talk with your health care provider before taking any vitamin or mineral supplements. If told by your health care provider, track how much liquid you drink so you can avoid drinking too much. You may need to include foods you eat that are made mostly from water, like gelatin, ice cream, soups, and juicy fruits and vegetables. If you have diabetes: If you have diabetes (diabetes mellitus) and CKD, you need to keep your blood sugar (glucose) in the target range recommended by your health care provider. Follow your diabetes management plan. This may include: Checking your blood glucose  regularly. Taking medicines by mouth, or taking insulin, or both. Exercising for at least 30 minutes on 5 or more days each week, or as told by your health care provider. Tracking how many servings of carbohydrates you eat at each meal. Not using orange juice to treat low blood sugars. Instead, use apple juice, cranberry juice, or clear soda. You may be given guidelines on what foods and nutrients you may eat, and how much you can have each day. This depends on your stage of kidney disease and whether you have high blood pressure (hypertension). Follow the meal plan your dietitian gives you. To learn more: National Institute of Diabetes and Digestive and Kidney Diseases: niddk.nih.gov National Kidney Foundation: kidney.org Summary Chronic kidney disease (CKD) is when your kidneys are not working well. They cannot remove waste, fluids, and other substances from your blood the way they should. These substances can build up, which can worsen kidney damage and affect how your body works. Changing your diet can help prevent more kidney damage. Diet changes may also delay dialysis or even keep you from needing it. Diet changes are different for each person with CKD. Work with a dietitian to set up a meal plan that is right for you. This information is not intended   to replace advice given to you by your health care provider. Make sure you discuss any questions you have with your health care provider. Document Revised: 03/04/2022 Document Reviewed: 03/09/2020 Elsevier Patient Education  2023 Elsevier Inc.  

## 2023-04-17 NOTE — Telephone Encounter (Signed)
Last Fill: 09/05/2022  Eye exam: 05/04/2022 Normal   Labs: 12/26/2022 Creatinine 1.13 eGFR 54  Next Visit: 04/19/2023  Last Visit: 10/19/2022  [id number] rheumatoid arthritis   Current Dose per office note 10/19/2022: hydroxychloroquine 300 mg daily   Okay to refill Plaquenil?

## 2023-04-18 DIAGNOSIS — M5432 Sciatica, left side: Secondary | ICD-10-CM | POA: Diagnosis not present

## 2023-04-18 DIAGNOSIS — M9905 Segmental and somatic dysfunction of pelvic region: Secondary | ICD-10-CM | POA: Diagnosis not present

## 2023-04-18 DIAGNOSIS — M9903 Segmental and somatic dysfunction of lumbar region: Secondary | ICD-10-CM | POA: Diagnosis not present

## 2023-04-18 DIAGNOSIS — M5416 Radiculopathy, lumbar region: Secondary | ICD-10-CM | POA: Diagnosis not present

## 2023-04-19 ENCOUNTER — Encounter: Payer: Self-pay | Admitting: Internal Medicine

## 2023-04-19 ENCOUNTER — Ambulatory Visit: Payer: 59 | Attending: Internal Medicine | Admitting: Internal Medicine

## 2023-04-19 VITALS — BP 152/83 | HR 76 | Resp 14 | Ht 64.0 in | Wt 214.0 lb

## 2023-04-19 DIAGNOSIS — Z79899 Other long term (current) drug therapy: Secondary | ICD-10-CM

## 2023-04-19 DIAGNOSIS — E1121 Type 2 diabetes mellitus with diabetic nephropathy: Secondary | ICD-10-CM

## 2023-04-19 DIAGNOSIS — M1990 Unspecified osteoarthritis, unspecified site: Secondary | ICD-10-CM

## 2023-04-19 DIAGNOSIS — M059 Rheumatoid arthritis with rheumatoid factor, unspecified: Secondary | ICD-10-CM

## 2023-04-19 LAB — CBC WITH DIFFERENTIAL/PLATELET
Basophils Absolute: 49 cells/uL (ref 0–200)
Hemoglobin: 11.9 g/dL (ref 11.7–15.5)
Lymphs Abs: 1284 cells/uL (ref 850–3900)
MCHC: 32.1 g/dL (ref 32.0–36.0)
MCV: 89.8 fL (ref 80.0–100.0)
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
## 2023-04-20 ENCOUNTER — Other Ambulatory Visit: Payer: Self-pay | Admitting: Nurse Practitioner

## 2023-04-20 LAB — BASIC METABOLIC PANEL WITH GFR
BUN: 13 mg/dL (ref 7–25)
CO2: 23 mmol/L (ref 20–32)
Calcium: 9.1 mg/dL (ref 8.6–10.4)
Chloride: 108 mmol/L (ref 98–110)
Creat: 1.04 mg/dL (ref 0.50–1.05)
Glucose, Bld: 83 mg/dL (ref 65–99)
Potassium: 4 mmol/L (ref 3.5–5.3)
Sodium: 142 mmol/L (ref 135–146)
eGFR: 59 mL/min/{1.73_m2} — ABNORMAL LOW (ref >=60)

## 2023-04-20 LAB — CBC WITH DIFFERENTIAL/PLATELET
Absolute Monocytes: 384 cells/uL (ref 200–950)
Basophils Relative: 1.3 %
Eosinophils Absolute: 49 cells/uL (ref 15–500)
Eosinophils Relative: 1.3 %
HCT: 37.1 % (ref 35.0–45.0)
MCH: 28.8 pg (ref 27.0–33.0)
MPV: 10.6 fL (ref 7.5–12.5)
Monocytes Relative: 10.1 %
Neutro Abs: 2033 cells/uL (ref 1500–7800)
Neutrophils Relative %: 53.5 %
Platelets: 216 10*3/uL (ref 140–400)
RBC: 4.13 10*6/uL (ref 3.80–5.10)
RDW: 13 % (ref 11.0–15.0)
Total Lymphocyte: 33.8 %
WBC: 3.8 10*3/uL (ref 3.8–10.8)

## 2023-04-20 LAB — HEMOGLOBIN A1C W/OUT EAG: Hgb A1c MFr Bld: 5.6 % of total Hgb (ref <5.7)

## 2023-04-20 LAB — SEDIMENTATION RATE: Sed Rate: 2 mm/h (ref 0–30)

## 2023-04-20 NOTE — Progress Notes (Signed)
Lab result look good. Her sedimentation rate is normal, I think she can decrease the hydroxychloroquine dose as discussed without much risk. Her blood count and kidney function and her A1c look good as well.  I am also forwarding to her PCP who has appointment scheduled tomorrow.

## 2023-04-21 ENCOUNTER — Ambulatory Visit (INDEPENDENT_AMBULATORY_CARE_PROVIDER_SITE_OTHER): Payer: 59 | Admitting: Nurse Practitioner

## 2023-04-21 ENCOUNTER — Encounter: Payer: Self-pay | Admitting: Nurse Practitioner

## 2023-04-21 VITALS — BP 148/78 | HR 87 | Temp 97.7°F | Ht 64.02 in | Wt 213.8 lb

## 2023-04-21 DIAGNOSIS — M059 Rheumatoid arthritis with rheumatoid factor, unspecified: Secondary | ICD-10-CM | POA: Diagnosis not present

## 2023-04-21 DIAGNOSIS — Z6832 Body mass index (BMI) 32.0-32.9, adult: Secondary | ICD-10-CM

## 2023-04-21 DIAGNOSIS — E1169 Type 2 diabetes mellitus with other specified complication: Secondary | ICD-10-CM

## 2023-04-21 DIAGNOSIS — I152 Hypertension secondary to endocrine disorders: Secondary | ICD-10-CM

## 2023-04-21 DIAGNOSIS — G4733 Obstructive sleep apnea (adult) (pediatric): Secondary | ICD-10-CM

## 2023-04-21 DIAGNOSIS — E1122 Type 2 diabetes mellitus with diabetic chronic kidney disease: Secondary | ICD-10-CM

## 2023-04-21 DIAGNOSIS — E785 Hyperlipidemia, unspecified: Secondary | ICD-10-CM | POA: Diagnosis not present

## 2023-04-21 DIAGNOSIS — E6609 Other obesity due to excess calories: Secondary | ICD-10-CM

## 2023-04-21 DIAGNOSIS — Z79899 Other long term (current) drug therapy: Secondary | ICD-10-CM | POA: Diagnosis not present

## 2023-04-21 DIAGNOSIS — E1159 Type 2 diabetes mellitus with other circulatory complications: Secondary | ICD-10-CM

## 2023-04-21 DIAGNOSIS — N183 Chronic kidney disease, stage 3 unspecified: Secondary | ICD-10-CM

## 2023-04-21 DIAGNOSIS — E1121 Type 2 diabetes mellitus with diabetic nephropathy: Secondary | ICD-10-CM | POA: Diagnosis not present

## 2023-04-21 MED ORDER — SEMAGLUTIDE (2 MG/DOSE) 8 MG/3ML ~~LOC~~ SOPN
2.0000 mg | PEN_INJECTOR | SUBCUTANEOUS | 4 refills | Status: DC
Start: 2023-04-21 — End: 2024-02-13

## 2023-04-21 MED ORDER — TRIAMCINOLONE ACETONIDE 0.1 % EX CREA: 1.0000 | TOPICAL_CREAM | Freq: Two times a day (BID) | CUTANEOUS | 2 refills | Status: DC

## 2023-04-21 MED ORDER — TELMISARTAN 40 MG PO TABS: 40.0000 mg | ORAL_TABLET | Freq: Every day | ORAL | 4 refills | Status: DC

## 2023-04-21 NOTE — Assessment & Plan Note (Signed)
Chronic, ongoing.  A1c 5.6% with rheumatology recently, praised for ongoing success with A1c in normal range now.  Urine ALB 10 (January 2024), continue Telmisartan for kidney protection.  Will increase Ozempic to 2 MG weekly which is offering ongoing benefit to diabetes and weight -- increase may reduce food noise and assist with weight loss, she is to notify provider if levels on BS get <70 consistently.  Continue off Jardiance as is tolerating this. Continue increased diet focus and monitoring BS twice a day.  Recommend focus on healthy weight loss and exercise daily.  Have recommended if any consistent lows <70 immediately alert provider so can adjust medication.  Return in 3 months. - On ARB and Statin - Foot and eye exam up to date - Vaccinations, refuses flu vaccine.

## 2023-04-21 NOTE — Assessment & Plan Note (Signed)
BMI 36.68 with T2DM, HTN/HLD.  Recommended eating smaller high protein, low fat meals more frequently and exercising 30 mins a day 5 times a week with a goal of 10-15lb weight loss in the next 3 months. Patient voiced their understanding and motivation to adhere to these recommendations.

## 2023-04-21 NOTE — Assessment & Plan Note (Signed)
Chronic, ongoing.  Continue current medication regimen and adjust as needed. Lipid panel today. 

## 2023-04-21 NOTE — Assessment & Plan Note (Signed)
Chronic, ongoing.  Continue Telmisartan for kidney protection.  LABS: up to date.  Continue collaboration with nephrology, appreciate their input.  Recent note reviewed.

## 2023-04-21 NOTE — Assessment & Plan Note (Signed)
Continue collaboration with rheumatology, labs up to date and recent note reviewed.

## 2023-04-21 NOTE — Progress Notes (Signed)
BP (!) 148/78 (BP Location: Left Arm, Patient Position: Sitting, Cuff Size: Large)   Pulse 87   Temp 97.7 F (36.5 C) (Oral)   Ht 5' 4.02" (1.626 m)   Wt 213 lb 12.8 oz (97 kg)   LMP  (LMP Unknown)   SpO2 94%   BMI 36.68 kg/m    Subjective:    Patient ID: Kim Thomas, female    DOB: September 29, 1956, 67 y.o.   MRN: [id number]  HPI: Kim Thomas is a 67 y.o. female  Chief Complaint  Patient presents with   Diabetes   Hypertension    Patient states her her BP has been running on the high side lately   Hyperlipidemia   Chronic Kidney Disease   RA   DIABETES A1c with Dr. Dimple Casey on 04/20/23 was 5.6%.  Continues on Ozempic 1 MG weekly.  Has gained some weight back since last visit.  Stopped Jardiance in 2022 due to stable A1c.  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 She has started walking again to help lose weight -- she is up to 4 miles when she goes for walks.   Taking Gabapentin 400 MG BID for neuropathy.  This is ordered TID and if having a bad day will take the 3rd dose. Hypoglycemic episodes: no Polydipsia/polyuria: no Visual disturbance: no Chest pain: no Paresthesias: no Glucose Monitoring: yes             Accucheck frequency: daily             Fasting glucose: 99 to 108             Post prandial:             Evening:              Before meals:  Taking Insulin?: no             Long acting insulin:             Short acting insulin: Blood Pressure Monitoring: not checking Retinal Examination: Up To Date Foot Exam: Up to Date Pneumovax: Up To Date Influenza: refuses Aspirin: yes  HYPERTENSION / HYPERLIPIDEMIA Continues on Atorvastatin 20 MG daily and Telmisartan 20 MG.  Her BP has been trending up with recent weight gain.   Satisfied with current treatment? yes Duration of hypertension: chronic BP monitoring frequency: 2-3 times a week BP range: 153/82 BP medication side  effects: no Duration of hyperlipidemia: chronic Cholesterol medication side effects: no Cholesterol supplements: none Medication compliance: good compliance Aspirin: yes Recent stressors: no Recurrent headaches: no Visual changes: no Palpitations: no Dyspnea: no Chest pain: no Lower extremity edema: yes, in ankles L>R Dizzy/lightheaded: no The 10-year ASCVD risk score (Arnett DK, et al., 2019) is: 22.6%   Values used to calculate the score:     Age: 46 years     Sex: Female     Is Non-Hispanic African American: Yes     Diabetic: Yes     Tobacco smoker: No     Systolic Blood Pressure: 148 mmHg     Is BP treated: Yes     HDL Cholesterol: 78 mg/dL     Total Cholesterol: 146 mg/dL   CHRONIC KIDNEY DISEASE Follows with nephrology, Dr. Thedore Mins, last on 08/18/22.  CKD status: stable Medications renally dose: yes Previous renal evaluation: yes Pneumovax:  Up To Date Influenza Vaccine:  Refuses    Latest  Ref Rng & Units 04/19/2023   10:43 AM 12/26/2022    2:27 PM 10/19/2022   11:42 AM  BMP  Glucose 65 - 99 mg/dL 83  76  80   BUN 7 - 25 mg/dL 13  13  15    Creatinine 0.50 - 1.05 mg/dL 1.61  0.96  0.45   BUN/Creat Ratio 6 - 22 (calc) SEE NOTE:  12  SEE NOTE:   Sodium 135 - 146 mmol/L 142  140  141   Potassium 3.5 - 5.3 mmol/L 4.0  4.0  4.2   Chloride 98 - 110 mmol/L 108  104  106   CO2 20 - 32 mmol/L 23  22  28    Calcium 8.6 - 10.4 mg/dL 9.1  9.0  9.1    RHEUMATOID ARTHRITIS Last rheumatology visit on 04/19/23, continues Plaquenil but they have reduced this in 1/2 (200 MG).  History of elevation in uric acid levels, no current medications. Duration: months Pain: no Context:  better Decreased function/range of motion: improved Erythema: no Swelling: no Heat or warmth: no Morning stiffness: improved Aggravating factors: none Alleviating factors: Plaquenil Relief with NSAIDs?: No NSAIDs Taken Treatments attempted:  Parafin wax, Tylenol, Plaquenil, CBD cream Involved Joints:      Hands: yes bilateral    Wrists: none    Elbows: none    Shoulders: none    Back: yes     Hips: none    Ankles: none    Feet: none   Relevant past medical, surgical, family and social history reviewed and updated as indicated. Interim medical history since our last visit reviewed. Allergies and medications reviewed and updated.  Review of Systems  Constitutional:  Negative for activity change, appetite change, diaphoresis, fatigue and fever.  Respiratory:  Negative for cough, chest tightness and shortness of breath.   Cardiovascular:  Negative for chest pain, palpitations and leg swelling.  Gastrointestinal: Negative.   Endocrine: Negative for cold intolerance, heat intolerance, polydipsia, polyphagia and polyuria.  Neurological: Negative.   Psychiatric/Behavioral: Negative.     Per HPI unless specifically indicated above     Objective:    BP (!) 148/78 (BP Location: Left Arm, Patient Position: Sitting, Cuff Size: Large)   Pulse 87   Temp 97.7 F (36.5 C) (Oral)   Ht 5' 4.02" (1.626 m)   Wt 213 lb 12.8 oz (97 kg)   LMP  (LMP Unknown)   SpO2 94%   BMI 36.68 kg/m   Wt Readings from Last 3 Encounters:  04/21/23 213 lb 12.8 oz (97 kg)  04/19/23 214 lb (97.1 kg)  12/26/22 203 lb 8 oz (92.3 kg)    Physical Exam Vitals and nursing note reviewed.  Constitutional:      General: She is awake. She is not in acute distress.    Appearance: She is well-developed. She is obese. She is not ill-appearing.  HENT:     Head: Normocephalic.     Right Ear: Hearing normal. No drainage.     Left Ear: Hearing normal. No drainage.  Eyes:     General: Lids are normal.        Right eye: No discharge.        Left eye: No discharge.     Conjunctiva/sclera: Conjunctivae normal.     Pupils: Pupils are equal, round, and reactive to light.  Neck:     Vascular: No carotid bruit.  Cardiovascular:     Rate and Rhythm: Normal rate and regular rhythm.  Heart sounds: Normal heart sounds.  No murmur heard.    No gallop.  Pulmonary:     Effort: Pulmonary effort is normal. No accessory muscle usage or respiratory distress.     Breath sounds: Normal breath sounds.  Abdominal:     General: Bowel sounds are normal.     Palpations: Abdomen is soft.  Musculoskeletal:     Cervical back: Normal range of motion and neck supple.     Right lower leg: No edema.     Left lower leg: No edema.  Skin:    General: Skin is warm and dry.  Neurological:     Mental Status: She is alert and oriented to person, place, and time.  Psychiatric:        Attention and Perception: Attention normal.        Mood and Affect: Mood normal.        Speech: Speech normal.        Behavior: Behavior normal. Behavior is cooperative.        Thought Content: Thought content normal.    Results for orders placed or performed in visit on 04/19/23  BASIC METABOLIC PANEL WITH GFR  Result Value Ref Range   Glucose, Bld 83 65 - 99 mg/dL   BUN 13 7 - 25 mg/dL   Creat 0.98 1.19 - 1.47 mg/dL   eGFR 59 (L) > OR = 60 mL/min/1.48m2   BUN/Creatinine Ratio SEE NOTE: 6 - 22 (calc)   Sodium 142 135 - 146 mmol/L   Potassium 4.0 3.5 - 5.3 mmol/L   Chloride 108 98 - 110 mmol/L   CO2 23 20 - 32 mmol/L   Calcium 9.1 8.6 - 10.4 mg/dL  CBC with Differential/Platelet  Result Value Ref Range   WBC 3.8 3.8 - 10.8 Thousand/uL   RBC 4.13 3.80 - 5.10 Million/uL   Hemoglobin 11.9 11.7 - 15.5 g/dL   HCT 82.9 56.2 - 13.0 %   MCV 89.8 80.0 - 100.0 fL   MCH 28.8 27.0 - 33.0 pg   MCHC 32.1 32.0 - 36.0 g/dL   RDW 86.5 78.4 - 69.6 %   Platelets 216 140 - 400 Thousand/uL   MPV 10.6 7.5 - 12.5 fL   Neutro Abs 2,033 1,500 - 7,800 cells/uL   Lymphs Abs 1,284 850 - 3,900 cells/uL   Absolute Monocytes 384 200 - 950 cells/uL   Eosinophils Absolute 49 15 - 500 cells/uL   Basophils Absolute 49 0 - 200 cells/uL   Neutrophils Relative % 53.5 %   Total Lymphocyte 33.8 %   Monocytes Relative 10.1 %   Eosinophils Relative 1.3 %    Basophils Relative 1.3 %  Sedimentation rate  Result Value Ref Range   Sed Rate 2 0 - 30 mm/h  Hemoglobin A1C w/out eAG  Result Value Ref Range   Hgb A1c MFr Bld 5.6 <5.7 % of total Hgb      Assessment & Plan:   Problem List Items Addressed This Visit       Cardiovascular and Mediastinum   Hypertension associated with diabetes (HCC)    Chronic, ongoing.  BP above goal at home and in office with some weight gain recently.  Recommend she check BP at home three mornings a week and document for provider.  Increase Telmisartan back to 40 MG daily, discussed with her today.  LABS: up to date.  Educated her on Telmisartan and kidney benefit.  Urine micro alb 07 December 2022.  Focus on DASH diet  at home.  Return in 3 months.      Relevant Medications   telmisartan (MICARDIS) 40 MG tablet   Semaglutide, 2 MG/DOSE, 8 MG/3ML SOPN     Endocrine   CKD stage 3 due to type 2 diabetes mellitus (HCC)    Chronic, ongoing.  Continue Telmisartan for kidney protection.  LABS: up to date.  Continue collaboration with nephrology, appreciate their input.  Recent note reviewed.      Relevant Medications   telmisartan (MICARDIS) 40 MG tablet   Semaglutide, 2 MG/DOSE, 8 MG/3ML SOPN   Hyperlipidemia associated with type 2 diabetes mellitus (HCC)    Chronic, ongoing.  Continue current medication regimen and adjust as needed.  Lipid panel today.      Relevant Medications   telmisartan (MICARDIS) 40 MG tablet   Semaglutide, 2 MG/DOSE, 8 MG/3ML SOPN   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  A1c 5.6% with rheumatology recently, praised for ongoing success with A1c in normal range now.  Urine ALB 10 (January 2024), continue Telmisartan for kidney protection.  Will increase Ozempic to 2 MG weekly which is offering ongoing benefit to diabetes and weight -- increase may reduce food noise and assist with weight loss, she is to notify provider if levels on BS get <70  consistently.  Continue off Jardiance as is tolerating this. Continue increased diet focus and monitoring BS twice a day.  Recommend focus on healthy weight loss and exercise daily.  Have recommended if any consistent lows <70 immediately alert provider so can adjust medication.  Return in 3 months. - On ARB and Statin - Foot and eye exam up to date - Vaccinations, refuses flu vaccine.      Relevant Medications   telmisartan (MICARDIS) 40 MG tablet   Semaglutide, 2 MG/DOSE, 8 MG/3ML SOPN     Musculoskeletal and Integument   Seropositive rheumatoid arthritis (HCC)    Ongoing and improved.  Diagnosed on 01/20/22, continue collaboration with rheumatology and Plaquenil as ordered by them, she is noticing improved symptoms.  Recommend massage therapy added to regimen and continue to visit with chiropractor.        Other   Long-term use of hydroxychloroquine    Continue collaboration with rheumatology, labs up to date and recent note reviewed.      Obesity    BMI 36.68 with T2DM, HTN/HLD.  Recommended eating smaller high protein, low fat meals more frequently and exercising 30 mins a day 5 times a week with a goal of 10-15lb weight loss in the next 3 months. Patient voiced their understanding and motivation to adhere to these recommendations.       Relevant Medications   Semaglutide, 2 MG/DOSE, 8 MG/3ML SOPN     Follow up plan: Return in about 4 weeks (around 05/19/2023) for HTN and T2DM -- increased Telmisartan 40 MG and Ozempic 2 MG.

## 2023-04-21 NOTE — Assessment & Plan Note (Signed)
Chronic, ongoing.  BP above goal at home and in office with some weight gain recently.  Recommend she check BP at home three mornings a week and document for provider.  Increase Telmisartan back to 40 MG daily, discussed with her today.  LABS: up to date.  Educated her on Telmisartan and kidney benefit.  Urine micro alb 07 December 2022.  Focus on DASH diet at home.  Return in 3 months.

## 2023-04-21 NOTE — Telephone Encounter (Signed)
Requested Prescriptions  Pending Prescriptions Disp Refills   atorvastatin (LIPITOR) 20 MG tablet [Pharmacy Med Name: Atorvastatin Calcium 20 MG Oral Tablet] 100 tablet 2    Sig: TAKE 1 TABLET BY MOUTH ONCE  DAILY     Cardiovascular:  Antilipid - Statins Failed - 04/20/2023 10:26 PM      Failed - Lipid Panel in normal range within the last 12 months    Cholesterol, Total  Date Value Ref Range Status  12/26/2022 146 100 - 199 mg/dL Final   Cholesterol Piccolo, Waived  Date Value Ref Range Status  05/28/2019 150 <200 mg/dL Final    Comment:                            Desirable                <200                         Borderline High      200- 239                         High                     >239    LDL Chol Calc (NIH)  Date Value Ref Range Status  12/26/2022 52 0 - 99 mg/dL Final   HDL  Date Value Ref Range Status  12/26/2022 78 >39 mg/dL Final   Triglycerides  Date Value Ref Range Status  12/26/2022 86 0 - 149 mg/dL Final   Triglycerides Piccolo,Waived  Date Value Ref Range Status  05/28/2019 92 <150 mg/dL Final    Comment:                            Normal                   <150                         Borderline High     150 - 199                         High                200 - 499                         Very High                >499          Passed - Patient is not pregnant      Passed - Valid encounter within last 12 months    Recent Outpatient Visits           3 months ago Type 2 diabetes mellitus with diabetic nephropathy, without long-term current use of insulin (HCC)   [place] [facility] [place], Harrison T, NP   7 months ago Type 2 diabetes mellitus with diabetic nephropathy, without long-term current use of insulin (HCC)   [place] [facility] [place], [place] T, NP   10 months ago Type 2 diabetes mellitus with diabetic nephropathy, without long-term current use of insulin (HCC)   [place] [facility] [place],  Dorie Rank, NP   1 year ago Type 2 diabetes mellitus with diabetic nephropathy, without long-term current use of insulin (HCC)   [place] [facility] [place], [place] T, NP   1 year ago Chronic inflammatory arthritis   [place] [facility] [place], Dorie Rank, NP       Future Appointments             Today Marjie Skiff, NP [place] [facility]   In 4 months   [facility]   In 6 months Rice, Jamesetta Orleans, MD [facility]

## 2023-04-21 NOTE — Assessment & Plan Note (Signed)
Ongoing and improved.  Diagnosed on 01/20/22, continue collaboration with rheumatology and Plaquenil as ordered by them, she is noticing improved symptoms.  Recommend massage therapy added to regimen and continue to visit with chiropractor. 

## 2023-04-25 ENCOUNTER — Other Ambulatory Visit: Payer: Self-pay | Admitting: Nurse Practitioner

## 2023-04-26 NOTE — Telephone Encounter (Signed)
Requested Prescriptions  Pending Prescriptions Disp Refills   ONETOUCH ULTRA test strip [Pharmacy Med Name: OneTouch Ultra In Vitro Strip] 100 strip 0    Sig: USE TO CHECK BLOOD SUGAR DAILY     Endocrinology: Diabetes - Testing Supplies Passed - 04/25/2023 10:32 PM      Passed - Valid encounter within last 12 months    Recent Outpatient Visits           5 days ago Type 2 diabetes mellitus with diabetic nephropathy, without long-term current use of insulin (HCC)   [place] [facility] [place], Darrow T, NP   4 months ago Type 2 diabetes mellitus with diabetic nephropathy, without long-term current use of insulin (HCC)   [facility] [place], [place] T, NP   7 months ago Type 2 diabetes mellitus with diabetic nephropathy, without long-term current use of insulin (HCC)   [place] [facility] [place], [place] T, NP   11 months ago Type 2 diabetes mellitus with diabetic nephropathy, without long-term current use of insulin (HCC)   [place] [facility] [place], [place] T, NP   1 year ago Type 2 diabetes mellitus with diabetic nephropathy, without long-term current use of insulin (HCC)   [place] [facility] [place], Dorie Rank, NP       Future Appointments             In 3 weeks [place], Dorie Rank, NP [facility], [facility]   In 3 months  [place] [facility]   In 5 months Rice, Jamesetta Orleans, MD [facility]

## 2023-05-04 ENCOUNTER — Telehealth: Payer: Self-pay | Admitting: Nurse Practitioner

## 2023-05-04 NOTE — Telephone Encounter (Signed)
The patient called in stating her blood glucose meter has expired. She uses the One Touch glucose system and she is requesting a new prescription for a new meter be sent into her pharmacy at    [facility] [place], [place] - [address] Phone: [phone]  Fax: [phone]   Please assist patient further

## 2023-05-05 NOTE — Telephone Encounter (Signed)
This probably needs to be sent to a local pharmacy.  Which pharmacy does she want to use?

## 2023-05-05 NOTE — Telephone Encounter (Signed)
Patient is requesting a new glucose meter, would like new one sent to pharmacy

## 2023-05-07 MED ORDER — BLOOD GLUCOSE TEST VI STRP: 1.0000 | ORAL_STRIP | Freq: Three times a day (TID) | 12 refills | Status: DC

## 2023-05-07 MED ORDER — ONETOUCH ULTRA 2 W/DEVICE KIT: PACK | 4 refills | Status: DC

## 2023-05-07 MED ORDER — LANCETS MISC. MISC: 1.0000 | Freq: Three times a day (TID) | 12 refills | Status: AC

## 2023-05-09 ENCOUNTER — Telehealth: Payer: Self-pay

## 2023-05-09 DIAGNOSIS — M79641 Pain in right hand: Secondary | ICD-10-CM

## 2023-05-09 DIAGNOSIS — M059 Rheumatoid arthritis with rheumatoid factor, unspecified: Secondary | ICD-10-CM

## 2023-05-09 DIAGNOSIS — R768 Other specified abnormal immunological findings in serum: Secondary | ICD-10-CM

## 2023-05-09 MED ORDER — HYDROXYCHLOROQUINE SULFATE 200 MG PO TABS: 300.0000 mg | ORAL_TABLET | Freq: Every day | ORAL | Status: DC

## 2023-05-09 NOTE — Addendum Note (Signed)
Addended by: Fuller Plan on: 05/09/2023 01:10 PM   Modules accepted: Orders

## 2023-05-09 NOTE — Telephone Encounter (Signed)
She is okay to increase back to the 300 mg dose. Can use the currently prescribed tablets for now. Will probably need to contact us when needing refill if it is sooner than 90 days.

## 2023-05-09 NOTE — Telephone Encounter (Signed)
Patient advised She is okay to increase back to the 300 mg dose. Can use the currently prescribed tablets for now. Will probably need to contact us when needing refill if it is sooner than 90 days. Patient verbalized understanding.

## 2023-05-09 NOTE — Telephone Encounter (Signed)
Patient contacted the office and states she was taking 300MG  or one and a half tablets of hydroxychlorquine a day. Patient states at the last appointment patient states she wanted to go down to 200MG  or one tablet a day. Patient states she has been taking 200MG  of hydroxychlorquine and it is not working for her and would like to go back up to 300 MG. Patient inquires if it is okay to go back to taking 300MG . Patient call back number is [phone]. Please advise.

## 2023-05-14 NOTE — Patient Instructions (Signed)
Be Involved in Your Health Care:  Taking Medications When medications are taken as directed, they can greatly improve your health. But if they are not taken as instructed, they may not work. In some cases, not taking them correctly can be harmful. To help ensure your treatment remains effective and safe, understand your medications and how to take them.  Your lab results, notes and after visit summary will be available on My Chart. We strongly encourage you to use this feature. If lab results are abnormal the clinic will contact you with the appropriate steps. If the clinic does not contact you assume the results are satisfactory. You can always see your results on My Chart. If you have questions regarding your condition, please contact the clinic during office hours. You can also ask questions on My Chart.  We at [facility] are grateful that you chose us to provide care. We strive to provide excellent and compassionate care and are always looking for feedback. If you get a survey from the clinic please complete this.   DASH Eating Plan DASH stands for Dietary Approaches to Stop Hypertension. The DASH eating plan is a healthy eating plan that has been shown to: Lower high blood pressure (hypertension). Reduce your risk for type 2 diabetes, heart disease, and stroke. Help with weight loss. What are tips for following this plan? Reading food labels Check food labels for the amount of salt (sodium) per serving. Choose foods with less than 5 percent of the Daily Value (DV) of sodium. In general, foods with less than 300 milligrams (mg) of sodium per serving fit into this eating plan. To find whole grains, look for the word "whole" as the first word in the ingredient list. Shopping Buy products labeled as "low-sodium" or "no salt added." Buy fresh foods. Avoid canned foods and pre-made or frozen meals. Cooking Try not to add salt when you cook. Use salt-free seasonings or herbs instead  of table salt or sea salt. Check with your health care provider or pharmacist before using salt substitutes. Do not fry foods. Cook foods in healthy ways, such as baking, boiling, grilling, roasting, or broiling. Cook using oils that are good for your heart. These include olive, canola, avocado, soybean, and sunflower oil. Meal planning  Eat a balanced diet. This should include: 4 or more servings of fruits and 4 or more servings of vegetables each day. Try to fill half of your plate with fruits and vegetables. 6-8 servings of whole grains each day. 6 or less servings of lean meat, poultry, or fish each day. 1 oz is 1 serving. A 3 oz (85 g) serving of meat is about the same size as the palm of your hand. One egg is 1 oz (28 g). 2-3 servings of low-fat dairy each day. One serving is 1 cup (237 mL). 1 serving of nuts, seeds, or beans 5 times each week. 2-3 servings of heart-healthy fats. Healthy fats called omega-3 fatty acids are found in foods such as walnuts, flaxseeds, fortified milks, and eggs. These fats are also found in cold-water fish, such as sardines, salmon, and mackerel. Limit how much you eat of: Canned or prepackaged foods. Food that is high in trans fat, such as fried foods. Food that is high in saturated fat, such as fatty meat. Desserts and other sweets, sugary drinks, and other foods with added sugar. Full-fat dairy products. Do not salt foods before eating. Do not eat more than 4 egg yolks a week. Try to   eat at least 2 vegetarian meals a week. Eat more home-cooked food and less restaurant, buffet, and fast food. Lifestyle When eating at a restaurant, ask if your food can be made with less salt or no salt. If you drink alcohol: Limit how much you have to: 0-1 drink a day if you are female. 0-2 drinks a day if you are female. Know how much alcohol is in your drink. In the [place] one drink is one 12 oz bottle of beer (355 mL), one 5 oz glass of wine (148 mL), or one 1 oz  glass of hard liquor (44 mL). General information Avoid eating more than 2,300 mg of salt a day. If you have hypertension, you may need to reduce your sodium intake to 1,500 mg a day. Work with your provider to stay at a healthy body weight or lose weight. Ask what the best weight range is for you. On most days of the week, get at least 30 minutes of exercise that causes your heart to beat faster. This may include walking, swimming, or biking. Work with your provider or dietitian to adjust your eating plan to meet your specific calorie needs. What foods should I eat? Fruits All fresh, dried, or frozen fruit. Canned fruits that are in their natural juice and do not have sugar added to them. Vegetables Fresh or frozen vegetables that are raw, steamed, roasted, or grilled. Low-sodium or reduced-sodium tomato and vegetable juice. Low-sodium or reduced-sodium tomato sauce and tomato paste. Low-sodium or reduced-sodium canned vegetables. Grains Whole-grain or whole-wheat bread. Whole-grain or whole-wheat pasta. Brown rice. Oatmeal. Quinoa. Bulgur. Whole-grain and low-sodium cereals. Pita bread. Low-fat, low-sodium crackers. Whole-wheat flour tortillas. Meats and other proteins Skinless chicken or turkey. Ground chicken or turkey. Pork with fat trimmed off. Fish and seafood. Egg whites. Dried beans, peas, or lentils. Unsalted nuts, nut butters, and seeds. Unsalted canned beans. Lean cuts of beef with fat trimmed off. Low-sodium, lean precooked or cured meat, such as sausages or meat loaves. Dairy Low-fat (1%) or fat-free (skim) milk. Reduced-fat, low-fat, or fat-free cheeses. Nonfat, low-sodium ricotta or cottage cheese. Low-fat or nonfat yogurt. Low-fat, low-sodium cheese. Fats and oils Soft margarine without trans fats. Vegetable oil. Reduced-fat, low-fat, or light mayonnaise and salad dressings (reduced-sodium). Canola, safflower, olive, avocado, soybean, and sunflower oils. Avocado. Seasonings and  condiments Herbs. Spices. Seasoning mixes without salt. Other foods Unsalted popcorn and pretzels. Fat-free sweets. The items listed above may not be all the foods and drinks you can have. Talk to a dietitian to learn more. What foods should I avoid? Fruits Canned fruit in a light or heavy syrup. Fried fruit. Fruit in cream or butter sauce. Vegetables Creamed or fried vegetables. Vegetables in a cheese sauce. Regular canned vegetables that are not marked as low-sodium or reduced-sodium. Regular canned tomato sauce and paste that are not marked as low-sodium or reduced-sodium. Regular tomato and vegetable juices that are not marked as low-sodium or reduced-sodium. Pickles. Olives. Grains Baked goods made with fat, such as croissants, muffins, or some breads. Dry pasta or rice meal packs. Meats and other proteins Fatty cuts of meat. Ribs. Fried meat. Bacon. Bologna, salami, and other precooked or cured meats, such as sausages or meat loaves, that are not lean and low in sodium. Fat from the back of a pig (fatback). Bratwurst. Salted nuts and seeds. Canned beans with added salt. Canned or smoked fish. Whole eggs or egg yolks. Chicken or turkey with skin. Dairy Whole or 2% milk, cream, and   half-and-half. Whole or full-fat cream cheese. Whole-fat or sweetened yogurt. Full-fat cheese. Nondairy creamers. Whipped toppings. Processed cheese and cheese spreads. Fats and oils Butter. Stick margarine. Lard. Shortening. Ghee. Bacon fat. Tropical oils, such as coconut, palm kernel, or palm oil. Seasonings and condiments Onion salt, garlic salt, seasoned salt, table salt, and sea salt. Worcestershire sauce. Tartar sauce. Barbecue sauce. Teriyaki sauce. Soy sauce, including reduced-sodium soy sauce. Steak sauce. Canned and packaged gravies. Fish sauce. Oyster sauce. Cocktail sauce. Store-bought horseradish. Ketchup. Mustard. Meat flavorings and tenderizers. Bouillon cubes. Hot sauces. Pre-made or packaged  marinades. Pre-made or packaged taco seasonings. Relishes. Regular salad dressings. Other foods Salted popcorn and pretzels. The items listed above may not be all the foods and drinks you should avoid. Talk to a dietitian to learn more. Where to find more information National Heart, Lung, and Blood Institute (NHLBI): nhlbi.nih.gov American Heart Association (AHA): heart.org Academy of Nutrition and Dietetics: eatright.org National Kidney Foundation (NKF): kidney.org This information is not intended to replace advice given to you by your health care provider. Make sure you discuss any questions you have with your health care provider. Document Revised: 12/01/2022 Document Reviewed: 12/01/2022 Elsevier Patient Education  2024 Elsevier Inc.  

## 2023-05-16 DIAGNOSIS — M9903 Segmental and somatic dysfunction of lumbar region: Secondary | ICD-10-CM | POA: Diagnosis not present

## 2023-05-16 DIAGNOSIS — M9905 Segmental and somatic dysfunction of pelvic region: Secondary | ICD-10-CM | POA: Diagnosis not present

## 2023-05-16 DIAGNOSIS — M5416 Radiculopathy, lumbar region: Secondary | ICD-10-CM | POA: Diagnosis not present

## 2023-05-16 DIAGNOSIS — M5432 Sciatica, left side: Secondary | ICD-10-CM | POA: Diagnosis not present

## 2023-05-19 ENCOUNTER — Encounter: Payer: Self-pay | Admitting: Nurse Practitioner

## 2023-05-19 ENCOUNTER — Ambulatory Visit (INDEPENDENT_AMBULATORY_CARE_PROVIDER_SITE_OTHER): Payer: 59 | Admitting: Nurse Practitioner

## 2023-05-19 VITALS — BP 138/79 | HR 83 | Temp 98.0°F | Ht 64.02 in | Wt 209.2 lb

## 2023-05-19 DIAGNOSIS — I152 Hypertension secondary to endocrine disorders: Secondary | ICD-10-CM | POA: Diagnosis not present

## 2023-05-19 DIAGNOSIS — Z7985 Long-term (current) use of injectable non-insulin antidiabetic drugs: Secondary | ICD-10-CM | POA: Diagnosis not present

## 2023-05-19 DIAGNOSIS — E1159 Type 2 diabetes mellitus with other circulatory complications: Secondary | ICD-10-CM | POA: Diagnosis not present

## 2023-05-19 DIAGNOSIS — E1121 Type 2 diabetes mellitus with diabetic nephropathy: Secondary | ICD-10-CM

## 2023-05-19 NOTE — Assessment & Plan Note (Signed)
Chronic, stable with BP now trending down.  Recommend she check BP at home three mornings a week and document for provider.  Continue Telmisartan 40 MG daily, discussed with her today.  LABS: up to date.  Educated her on Telmisartan and kidney benefit.  Urine micro alb 07 December 2022.  Focus on DASH diet at home.  Return in 2 months.

## 2023-05-19 NOTE — Assessment & Plan Note (Addendum)
Chronic, ongoing.  A1c 5.6% last visit, praised for ongoing success with A1c in normal range now.  Urine ALB 10 (January 2024), continue Telmisartan for kidney protection.  Will continue Ozempic 2 MG weekly which is offering ongoing benefit to diabetes and weight + reducing food noise and assisting with weight loss, she is to notify provider if levels on BS get <70 consistently.  Continue off Jardiance as is tolerating this. Continue increased diet focus and monitoring BS twice a day.  Recommend focus on healthy weight loss and exercise daily.  Have recommended if any consistent lows <70 immediately alert provider so can adjust medication.  Return in 2 months. - On ARB and Statin - Foot and eye exam up to date - Vaccinations, refuses flu vaccine.

## 2023-05-19 NOTE — Progress Notes (Signed)
BP 138/79   Pulse 83   Temp 98 F (36.7 C) (Oral)   Ht 5' 4.02" (1.626 m)   Wt 209 lb 3.2 oz (94.9 kg)   LMP  (LMP Unknown)   SpO2 96%   BMI 35.89 kg/m    Subjective:    Patient ID: Kim Thomas, female    DOB: [birth year], 67 y.o.   MRN: [id number]  HPI: Kim Thomas is a 67 y.o. female  Chief Complaint  Patient presents with   Hypertension    Increased Telmisartan at last visit    Diabetes    Increased Ozempic at last visit   DIABETES A1c 04/20/23 was 5.6%.  We increased Ozempic to 2 MG weekly last visit for more weight control and food thought reduction -- has lost 4 pounds. Stopped Jardiance in 2022 due to stable A1c.  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 She is walking again to help lose weight -- she is up to 4 miles when she goes for walks. Hypoglycemic episodes: no Polydipsia/polyuria: no Visual disturbance: no Chest pain: no Paresthesias: no Glucose Monitoring: yes             Accucheck frequency: daily             Fasting glucose: 99 to 108             Post prandial:             Evening:              Before meals:  Taking Insulin?: no             Long acting insulin:             Short acting insulin: Blood Pressure Monitoring: not checking Retinal Examination: Up To Date Foot Exam: Up to Date Pneumovax: Up To Date Influenza: refuses Aspirin: yes  HYPERTENSION / HYPERLIPIDEMIA Increased Telmisartan at last visit to 40 MG daily, continues on Atorvastatin. Her BP has been decreasing. Satisfied with current treatment? yes Duration of hypertension: chronic BP monitoring frequency: 2-3 times a week BP range: 130/70 range now at home BP medication side effects: no Duration of hyperlipidemia: chronic Cholesterol medication side effects: no Cholesterol supplements: none Medication compliance: good compliance Aspirin: yes Recent stressors: no Recurrent headaches:  no Visual changes: no Palpitations: no Dyspnea: no Chest pain: no Lower extremity edema: yes, in ankles L>R Dizzy/lightheaded: no The 10-year ASCVD risk score (Arnett DK, et al., 2019) is: 19.7%   Values used to calculate the score:     Age: 19 years     Sex: Female     Is Non-Hispanic African American: Yes     Diabetic: Yes     Tobacco smoker: No     Systolic Blood Pressure: 138 mmHg     Is BP treated: Yes     HDL Cholesterol: 78 mg/dL     Total Cholesterol: 146 mg/dL    Relevant past medical, surgical, family and social history reviewed and updated as indicated. Interim medical history since our last visit reviewed. Allergies and medications reviewed and updated.  Review of Systems  Constitutional:  Negative for activity change, appetite change, diaphoresis, fatigue and fever.  Respiratory:  Negative for cough, chest tightness and shortness of breath.   Cardiovascular:  Negative for chest pain, palpitations and leg swelling.  Gastrointestinal: Negative.   Endocrine: Negative for cold intolerance, heat intolerance, polydipsia, polyphagia  and polyuria.  Neurological: Negative.   Psychiatric/Behavioral: Negative.     Per HPI unless specifically indicated above     Objective:    BP 138/79   Pulse 83   Temp 98 F (36.7 C) (Oral)   Ht 5' 4.02" (1.626 m)   Wt 209 lb 3.2 oz (94.9 kg)   LMP  (LMP Unknown)   SpO2 96%   BMI 35.89 kg/m   Wt Readings from Last 3 Encounters:  05/19/23 209 lb 3.2 oz (94.9 kg)  04/21/23 213 lb 12.8 oz (97 kg)  04/19/23 214 lb (97.1 kg)    Physical Exam Vitals and nursing note reviewed.  Constitutional:      General: She is awake. She is not in acute distress.    Appearance: She is well-developed. She is obese. She is not ill-appearing.  HENT:     Head: Normocephalic.     Right Ear: Hearing normal. No drainage.     Left Ear: Hearing normal. No drainage.  Eyes:     General: Lids are normal.        Right eye: No discharge.        Left  eye: No discharge.     Conjunctiva/sclera: Conjunctivae normal.     Pupils: Pupils are equal, round, and reactive to light.  Neck:     Vascular: No carotid bruit.  Cardiovascular:     Rate and Rhythm: Normal rate and regular rhythm.     Heart sounds: Normal heart sounds. No murmur heard.    No gallop.  Pulmonary:     Effort: Pulmonary effort is normal. No accessory muscle usage or respiratory distress.     Breath sounds: Normal breath sounds.  Abdominal:     General: Bowel sounds are normal.     Palpations: Abdomen is soft.  Musculoskeletal:     Cervical back: Normal range of motion and neck supple.     Right lower leg: No edema.     Left lower leg: No edema.  Skin:    General: Skin is warm and dry.  Neurological:     Mental Status: She is alert and oriented to person, place, and time.  Psychiatric:        Attention and Perception: Attention normal.        Mood and Affect: Mood normal.        Speech: Speech normal.        Behavior: Behavior normal. Behavior is cooperative.        Thought Content: Thought content normal.    Results for orders placed or performed in visit on 04/19/23  BASIC METABOLIC PANEL WITH GFR  Result Value Ref Range   Glucose, Bld 83 65 - 99 mg/dL   BUN 13 7 - 25 mg/dL   Creat 6.57 8.46 - 9.62 mg/dL   eGFR 59 (L) > OR = 60 mL/min/1.35m2   BUN/Creatinine Ratio SEE NOTE: 6 - 22 (calc)   Sodium 142 135 - 146 mmol/L   Potassium 4.0 3.5 - 5.3 mmol/L   Chloride 108 98 - 110 mmol/L   CO2 23 20 - 32 mmol/L   Calcium 9.1 8.6 - 10.4 mg/dL  CBC with Differential/Platelet  Result Value Ref Range   WBC 3.8 3.8 - 10.8 Thousand/uL   RBC 4.13 3.80 - 5.10 Million/uL   Hemoglobin 11.9 11.7 - 15.5 g/dL   HCT 95.2 84.1 - 32.4 %   MCV 89.8 80.0 - 100.0 fL   MCH 28.8 27.0 - 33.0 pg  MCHC 32.1 32.0 - 36.0 g/dL   RDW 21.3 08.6 - 57.8 %   Platelets 216 140 - 400 Thousand/uL   MPV 10.6 7.5 - 12.5 fL   Neutro Abs 2,033 1,500 - 7,800 cells/uL   Lymphs Abs 1,284 850  - 3,900 cells/uL   Absolute Monocytes 384 200 - 950 cells/uL   Eosinophils Absolute 49 15 - 500 cells/uL   Basophils Absolute 49 0 - 200 cells/uL   Neutrophils Relative % 53.5 %   Total Lymphocyte 33.8 %   Monocytes Relative 10.1 %   Eosinophils Relative 1.3 %   Basophils Relative 1.3 %  Sedimentation rate  Result Value Ref Range   Sed Rate 2 0 - 30 mm/h  Hemoglobin A1C w/out eAG  Result Value Ref Range   Hgb A1c MFr Bld 5.6 <5.7 % of total Hgb      Assessment & Plan:   Problem List Items Addressed This Visit       Cardiovascular and Mediastinum   Hypertension associated with diabetes (HCC)    Chronic, stable with BP now trending down.  Recommend she check BP at home three mornings a week and document for provider.  Continue Telmisartan 40 MG daily, discussed with her today.  LABS: up to date.  Educated her on Telmisartan and kidney benefit.  Urine micro alb 07 December 2022.  Focus on DASH diet at home.  Return in 2 months.        Endocrine   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  A1c 5.6% last visit, praised for ongoing success with A1c in normal range now.  Urine ALB 10 (January 2024), continue Telmisartan for kidney protection.  Will continue Ozempic 2 MG weekly which is offering ongoing benefit to diabetes and weight + reducing food noise and assisting with weight loss, she is to notify provider if levels on BS get <70 consistently.  Continue off Jardiance as is tolerating this. Continue increased diet focus and monitoring BS twice a day.  Recommend focus on healthy weight loss and exercise daily.  Have recommended if any consistent lows <70 immediately alert provider so can adjust medication.  Return in 2 months. - On ARB and Statin - Foot and eye exam up to date - Vaccinations, refuses flu vaccine.        Follow up plan: Return in about 2 months (around 07/19/2023) for T2DM, HTN/HLD, RA, CKD.

## 2023-06-05 DIAGNOSIS — M5432 Sciatica, left side: Secondary | ICD-10-CM | POA: Diagnosis not present

## 2023-06-05 DIAGNOSIS — M9905 Segmental and somatic dysfunction of pelvic region: Secondary | ICD-10-CM | POA: Diagnosis not present

## 2023-06-05 DIAGNOSIS — M5416 Radiculopathy, lumbar region: Secondary | ICD-10-CM | POA: Diagnosis not present

## 2023-06-05 DIAGNOSIS — M9903 Segmental and somatic dysfunction of lumbar region: Secondary | ICD-10-CM | POA: Diagnosis not present

## 2023-06-13 DIAGNOSIS — M9905 Segmental and somatic dysfunction of pelvic region: Secondary | ICD-10-CM | POA: Diagnosis not present

## 2023-06-13 DIAGNOSIS — M5432 Sciatica, left side: Secondary | ICD-10-CM | POA: Diagnosis not present

## 2023-06-13 DIAGNOSIS — M9903 Segmental and somatic dysfunction of lumbar region: Secondary | ICD-10-CM | POA: Diagnosis not present

## 2023-06-13 DIAGNOSIS — M5416 Radiculopathy, lumbar region: Secondary | ICD-10-CM | POA: Diagnosis not present

## 2023-06-20 DIAGNOSIS — M9905 Segmental and somatic dysfunction of pelvic region: Secondary | ICD-10-CM | POA: Diagnosis not present

## 2023-06-20 DIAGNOSIS — M9903 Segmental and somatic dysfunction of lumbar region: Secondary | ICD-10-CM | POA: Diagnosis not present

## 2023-06-20 DIAGNOSIS — M5416 Radiculopathy, lumbar region: Secondary | ICD-10-CM | POA: Diagnosis not present

## 2023-06-20 DIAGNOSIS — M5432 Sciatica, left side: Secondary | ICD-10-CM | POA: Diagnosis not present

## 2023-06-27 DIAGNOSIS — M5416 Radiculopathy, lumbar region: Secondary | ICD-10-CM | POA: Diagnosis not present

## 2023-06-27 DIAGNOSIS — M5432 Sciatica, left side: Secondary | ICD-10-CM | POA: Diagnosis not present

## 2023-06-27 DIAGNOSIS — M9905 Segmental and somatic dysfunction of pelvic region: Secondary | ICD-10-CM | POA: Diagnosis not present

## 2023-06-27 DIAGNOSIS — M9903 Segmental and somatic dysfunction of lumbar region: Secondary | ICD-10-CM | POA: Diagnosis not present

## 2023-07-04 DIAGNOSIS — M5432 Sciatica, left side: Secondary | ICD-10-CM | POA: Diagnosis not present

## 2023-07-04 DIAGNOSIS — M9903 Segmental and somatic dysfunction of lumbar region: Secondary | ICD-10-CM | POA: Diagnosis not present

## 2023-07-04 DIAGNOSIS — M9905 Segmental and somatic dysfunction of pelvic region: Secondary | ICD-10-CM | POA: Diagnosis not present

## 2023-07-04 DIAGNOSIS — M5416 Radiculopathy, lumbar region: Secondary | ICD-10-CM | POA: Diagnosis not present

## 2023-07-07 ENCOUNTER — Ambulatory Visit (INDEPENDENT_AMBULATORY_CARE_PROVIDER_SITE_OTHER): Payer: 59 | Admitting: Family Medicine

## 2023-07-07 ENCOUNTER — Encounter: Payer: Self-pay | Admitting: Family Medicine

## 2023-07-07 VITALS — BP 109/69 | HR 87 | Ht 64.0 in | Wt 203.2 lb

## 2023-07-07 DIAGNOSIS — R0981 Nasal congestion: Secondary | ICD-10-CM | POA: Insufficient documentation

## 2023-07-07 DIAGNOSIS — J069 Acute upper respiratory infection, unspecified: Secondary | ICD-10-CM | POA: Diagnosis not present

## 2023-07-07 MED ORDER — PREDNISONE 10 MG PO TABS: ORAL_TABLET | ORAL | 0 refills | Status: DC

## 2023-07-07 MED ORDER — AMOXICILLIN-POT CLAVULANATE 875-125 MG PO TABS: 1.0000 | ORAL_TABLET | Freq: Two times a day (BID) | ORAL | 0 refills | Status: AC

## 2023-07-07 NOTE — Assessment & Plan Note (Signed)
Acute, ongoing. COVID done today. Prednisone 6 day taper given, instructed if no improvement in 4 days, start Augmentin BID for 10 days, since traveling and out of town during this period. Recommend: - Increased rest - Increasing Fluids - Acetaminophen as needed for fever/pain. - Mucinex.  - Saline sinus flushes or a neti pot.  - Humidifying the air

## 2023-07-07 NOTE — Progress Notes (Signed)
BP 109/69   Pulse 87   Ht 5\' 4"  (1.626 m)   Wt 203 lb 3.2 oz (92.2 kg)   LMP  (LMP Unknown)   SpO2 97%   BMI 34.88 kg/m    Subjective:    Patient ID: Kim Thomas, female    DOB: 10/12/1956, 67 y.o.   MRN: [id number]  HPI: Kim Thomas is a 67 y.o. female  Chief Complaint  Patient presents with   Sinus Problem    Patient says she thinks may have a sinus infection. Patient says her symptoms have gotten better since Monday, just have some lingering pressure points in her face. Patient says she has tried over the counter Tylenol. Patient says she increase the medication around the clock. Patient says she gets this symptoms every year around this time.   UPPER RESPIRATORY TRACT INFECTION Patient is traveling on Sunday and will be out of town for 2 weeks.  Worst symptom: Congestion and discomfort in her face and left ear. Fever: no , felt hot one day this week Cough: no Shortness of breath: no Wheezing: no Chest pain: no Chest tightness: no Chest congestion: no Nasal congestion: yes Runny nose: no Post nasal drip: no Sneezing: no Sore throat: no Swollen glands: yes Sinus pressure: yes Headache: no Face pain: yes Toothache: no Ear pain: no  Ear pressure: yes left Eyes red/itching:no Eye drainage/crusting: no  Vomiting: no Rash: no Fatigue: no Sick contacts: no  Strep contacts: no  Context: better Recurrent sinusitis: yes yearly or every other year Relief with OTC cold/cough medications: yes otc sinus tablets and nasal spray Treatments attempted: sinus tablets and nasal spray   Relevant past medical, surgical, family and social history reviewed and updated as indicated. Interim medical history since our last visit reviewed. Allergies and medications reviewed and updated.  Review of Systems  Constitutional:  Negative for chills, fatigue and fever.  HENT:  Positive for congestion, ear pain, rhinorrhea, sinus pressure, sinus pain and sneezing. Negative for  postnasal drip and sore throat.   Eyes:  Negative for discharge, redness and itching.  Respiratory:  Negative for cough, chest tightness, shortness of breath and wheezing.   Cardiovascular:  Negative for chest pain.  Gastrointestinal:  Negative for vomiting.  Skin:  Negative for rash.  Neurological:  Negative for headaches.    Per HPI unless specifically indicated above     Objective:    BP 109/69   Pulse 87   Ht 5\' 4"  (1.626 m)   Wt 203 lb 3.2 oz (92.2 kg)   LMP  (LMP Unknown)   SpO2 97%   BMI 34.88 kg/m   Wt Readings from Last 3 Encounters:  07/07/23 203 lb 3.2 oz (92.2 kg)  05/19/23 209 lb 3.2 oz (94.9 kg)  04/21/23 213 lb 12.8 oz (97 kg)    Physical Exam Vitals and nursing note reviewed.  Constitutional:      General: She is not in acute distress.    Appearance: Normal appearance. She is not ill-appearing, toxic-appearing or diaphoretic.  HENT:     Head: Normocephalic and atraumatic.     Right Ear: Tympanic membrane, ear canal and external ear normal. There is no impacted cerumen. Tympanic membrane is not erythematous.     Left Ear: Tympanic membrane, ear canal and external ear normal. There is no impacted cerumen. Tympanic membrane is not erythematous.     Nose: Congestion and rhinorrhea present. Rhinorrhea is clear.     Right Sinus: Maxillary sinus tenderness and  frontal sinus tenderness present.     Left Sinus: Maxillary sinus tenderness and frontal sinus tenderness present.     Mouth/Throat:     Mouth: Mucous membranes are moist.     Pharynx: Oropharynx is clear. No oropharyngeal exudate, posterior oropharyngeal erythema or postnasal drip.  Eyes:     General: No scleral icterus.       Right eye: No discharge.        Left eye: No discharge.     Extraocular Movements: Extraocular movements intact.     Conjunctiva/sclera: Conjunctivae normal.     Pupils: Pupils are equal, round, and reactive to light.  Neck:     Vascular: No carotid bruit.  Cardiovascular:      Rate and Rhythm: Normal rate and regular rhythm.     Pulses: Normal pulses.     Heart sounds: Normal heart sounds. No murmur heard.    No friction rub. No gallop.  Pulmonary:     Effort: Pulmonary effort is normal. No respiratory distress.     Breath sounds: Normal breath sounds. No stridor. No wheezing, rhonchi or rales.  Chest:     Chest wall: No tenderness.  Musculoskeletal:        General: Normal range of motion.     Cervical back: Normal range of motion and neck supple. No rigidity. No muscular tenderness.  Lymphadenopathy:     Cervical: Cervical adenopathy present.  Skin:    General: Skin is warm and dry.     Capillary Refill: Capillary refill takes less than 2 seconds.     Coloration: Skin is not jaundiced or pale.     Findings: No bruising, erythema, lesion or rash.  Neurological:     General: No focal deficit present.     Mental Status: She is alert and oriented to person, place, and time. Mental status is at baseline.     Cranial Nerves: No cranial nerve deficit.     Sensory: No sensory deficit.     Motor: No weakness.     Coordination: Coordination normal.     Gait: Gait normal.     Deep Tendon Reflexes: Reflexes normal.  Psychiatric:        Mood and Affect: Mood normal.        Behavior: Behavior normal.        Thought Content: Thought content normal.        Judgment: Judgment normal.     Results for orders placed or performed in visit on 04/19/23  BASIC METABOLIC PANEL WITH GFR  Result Value Ref Range   Glucose, Bld 83 65 - 99 mg/dL   BUN 13 7 - 25 mg/dL   Creat 6.96 7.89 - 3.81 mg/dL   eGFR 59 (L) > OR = 60 mL/min/1.78m2   BUN/Creatinine Ratio SEE NOTE: 6 - 22 (calc)   Sodium 142 135 - 146 mmol/L   Potassium 4.0 3.5 - 5.3 mmol/L   Chloride 108 98 - 110 mmol/L   CO2 23 20 - 32 mmol/L   Calcium 9.1 8.6 - 10.4 mg/dL  CBC with Differential/Platelet  Result Value Ref Range   WBC 3.8 3.8 - 10.8 Thousand/uL   RBC 4.13 3.80 - 5.10 Million/uL   Hemoglobin 11.9  11.7 - 15.5 g/dL   HCT 01.7 51.0 - 25.8 %   MCV 89.8 80.0 - 100.0 fL   MCH 28.8 27.0 - 33.0 pg   MCHC 32.1 32.0 - 36.0 g/dL   RDW 52.7 78.2 - 42.3 %  Platelets 216 140 - 400 Thousand/uL   MPV 10.6 7.5 - 12.5 fL   Neutro Abs 2,033 1,500 - 7,800 cells/uL   Lymphs Abs 1,284 850 - 3,900 cells/uL   Absolute Monocytes 384 200 - 950 cells/uL   Eosinophils Absolute 49 15 - 500 cells/uL   Basophils Absolute 49 0 - 200 cells/uL   Neutrophils Relative % 53.5 %   Total Lymphocyte 33.8 %   Monocytes Relative 10.1 %   Eosinophils Relative 1.3 %   Basophils Relative 1.3 %  Sedimentation rate  Result Value Ref Range   Sed Rate 2 0 - 30 mm/h  Hemoglobin A1C w/out eAG  Result Value Ref Range   Hgb A1c MFr Bld 5.6 <5.7 % of total Hgb      Assessment & Plan:   Problem List Items Addressed This Visit     Congestion of paranasal sinus - Primary    Acute, ongoing. COVID done today. Prednisone 6 day taper given, instructed if no improvement in 4 days, start Augmentin BID for 10 days, since traveling and out of town during this period. Recommend: - Increased rest - Increasing Fluids - Acetaminophen as needed for fever/pain. - Mucinex.  - Saline sinus flushes or a neti pot.  - Humidifying the air      Relevant Orders   Novel Coronavirus, NAA ([facility])     Follow up plan: Return if symptoms worsen or fail to improve.

## 2023-07-07 NOTE — Patient Instructions (Addendum)
Do steroid taper for 6 days and if no better on the 3rd day, start antibiotics on the fourth day for 10 days completion - Increased rest - Increasing Fluids - Acetaminophen as needed for fever/pain. - Mucinex.  - Saline sinus flushes or a neti pot.  - Humidifying the air

## 2023-07-10 NOTE — Progress Notes (Signed)
Hi Kim Thomas, your COVID results came back positive. It is recommended that you isolate for 5 days since the start of your symptoms. Being that you are out of the window for treatment, I recommend symptom management if your symptoms remain. This includes tylenol for fever, mucinex without DM for congestion, Coricidin HBP, increased liquids for hydration, and rest.

## 2023-07-19 ENCOUNTER — Ambulatory Visit: Payer: 59 | Admitting: Nurse Practitioner

## 2023-07-23 NOTE — Patient Instructions (Signed)
 Be Involved in Caring For Your Health:  Taking Medications When medications are taken as directed, they can greatly improve your health. But if they are not taken as prescribed, they may not work. In some cases, not taking them correctly can be harmful. To help ensure your treatment remains effective and safe, understand your medications and how to take them. Bring your medications to each visit for review by your provider.  Your lab results, notes, and after visit summary will be available on My Chart. We strongly encourage you to use this feature. If lab results are abnormal the clinic will contact you with the appropriate steps. If the clinic does not contact you assume the results are satisfactory. You can always view your results on My Chart. If you have questions regarding your health or results, please contact the clinic during office hours. You can also ask questions on My Chart.  We at [facility] are grateful that you chose [place] to provide your care. We strive to provide evidence-based and compassionate care and are always looking for feedback. If you get a survey from the clinic please complete this so we can hear your opinions.  Food Basics for Chronic Kidney Disease Chronic kidney disease (CKD) is when your kidneys are not working well. They cannot remove waste, fluids, and other substances from your blood the way they should. These substances can build up, which can worsen kidney damage and affect how your body works. Eating certain foods can lead to a buildup of these substances. Changing your diet can help prevent more kidney damage. Diet changes may also delay dialysis or even keep you from needing it. What nutrients should I limit? Work with your treatment team and a food expert (dietitian) to make a meal plan that's right for you. Foods you can eat and foods you should limit or avoid will depend on the stage of your kidney disease and any other health conditions you have.  The items listed below are not a complete list. Talk with your dietitian to learn what is best for you. Potassium Potassium affects how steadily your heart beats. Too much potassium in your blood can cause an irregular heartbeat or even a heart attack. You may need to limit foods that are high in potassium, such as: Liquid milk and soy milk. Salt substitutes that contain potassium. Fruits like bananas, apricots, nectarines, melon, prunes, raisins, kiwi, and oranges. Vegetables, such as potatoes, sweet potatoes, yams, tomatoes, leafy greens, beets, avocado, pumpkin, and winter squash. Beans, like lima beans. Nuts. Phosphorus Phosphorus is a mineral found in your bones. You need a balance between calcium and phosphorus to build and maintain healthy bones. Too much added phosphorus from the foods you eat can pull calcium from your bones. Losing calcium can make your bones weak and more likely to break. Too much phosphorus can also make your skin itch. You may need to limit foods that are high in phosphorus or that have added phosphorus, such as: Liquid milk and dairy products. Dark-colored sodas or soft drinks. Bran cereals and oatmeal. Protein  Protein helps you make and keep muscle. Protein also helps to repair your body's cells and tissues. One of the natural breakdown products of protein is a waste product called urea. When your kidneys are not working well, they cannot remove types of waste like urea. Reducing protein in your diet can help keep urea from building up in your blood. Depending on your stage of kidney disease, you may need to  eat smaller portions of foods that are high in protein. Sources of animal protein include: Meat (all types). Fish and seafood. Poultry. Eggs. Dairy. Other protein foods include: Beans and legumes. Nuts and nut butter. Soy, like tofu.  Sodium Salt (sodium) helps to keep a healthy balance of fluids in your body. Too much salt can increase your blood  pressure, which can harm your heart and lungs. Extra salt can also cause your body to keep too much fluid, making your kidneys work harder. You may need to limit or avoid foods that are high in salt, such as: Salt seasonings. Soy and teriyaki sauce. Packaged, precooked, cured, or processed meats, such as sausages or meat loaves. Sardines. Salted crackers and snack foods. Fast food. Canned soups and most canned foods. Pickled foods. Vegetable juice. Boxed mixes or ready-to-eat boxed meals and side dishes. Bottled dressings, sauces, and marinades. Talk with your dietitian about how much potassium, phosphorus, protein, and salt you may have each day. Helpful tips Read food labels  Check the amount of salt in foods. Limit foods that have salt or sodium listed among the first five ingredients. Try to eat low-salt foods. Check the ingredient list for added phosphorus or potassium. "Phos" in an ingredient is a sign that phosphorus has been added. Do not buy foods that are calcium-enriched or that have calcium added to them (are fortified). Buy canned vegetables and beans that say "no salt added" and rinse them before eating. Lifestyle Limit the amount of protein you eat from animal sources each day. Focus on protein from plant sources, like tofu and dried beans, peas, and lentils. Do not add salt to food when cooking or before eating. Do not eat star fruit. It can be toxic for people with kidney problems. Talk with your health care provider before taking any vitamin or mineral supplements. If told by your health care provider, track how much liquid you drink so you can avoid drinking too much. You may need to include foods you eat that are made mostly from water, like gelatin, ice cream, soups, and juicy fruits and vegetables. If you have diabetes: If you have diabetes (diabetes mellitus) and CKD, you need to keep your blood sugar (glucose) in the target range recommended by your health care  provider. Follow your diabetes management plan. This may include: Checking your blood glucose regularly. Taking medicines by mouth, or taking insulin, or both. Exercising for at least 30 minutes on 5 or more days each week, or as told by your health care provider. Tracking how many servings of carbohydrates you eat at each meal. Not using orange juice to treat low blood sugars. Instead, use apple juice, cranberry juice, or clear soda. You may be given guidelines on what foods and nutrients you may eat, and how much you can have each day. This depends on your stage of kidney disease and whether you have high blood pressure (hypertension). Follow the meal plan your dietitian gives you. To learn more: [employer] of Diabetes and Digestive and Kidney Diseases: [employer]: kidney.org Summary Chronic kidney disease (CKD) is when your kidneys are not working well. They cannot remove waste, fluids, and other substances from your blood the way they should. These substances can build up, which can worsen kidney damage and affect how your body works. Changing your diet can help prevent more kidney damage. Diet changes may also delay dialysis or even keep you from needing it. Diet changes are different for each person with CKD.  Work with a dietitian to set up a meal plan that is right for you. This information is not intended to replace advice given to you by your health care provider. Make sure you discuss any questions you have with your health care provider. Document Revised: 03/03/2022 Document Reviewed: 03/09/2020 Elsevier Patient Education  2024 [employer].

## 2023-07-28 ENCOUNTER — Encounter: Payer: Self-pay | Admitting: Nurse Practitioner

## 2023-07-28 ENCOUNTER — Ambulatory Visit (INDEPENDENT_AMBULATORY_CARE_PROVIDER_SITE_OTHER): Payer: 59 | Admitting: Nurse Practitioner

## 2023-07-28 VITALS — BP 134/73 | HR 90 | Temp 98.5°F | Wt 205.0 lb

## 2023-07-28 DIAGNOSIS — Z6832 Body mass index (BMI) 32.0-32.9, adult: Secondary | ICD-10-CM

## 2023-07-28 DIAGNOSIS — M059 Rheumatoid arthritis with rheumatoid factor, unspecified: Secondary | ICD-10-CM

## 2023-07-28 DIAGNOSIS — I152 Hypertension secondary to endocrine disorders: Secondary | ICD-10-CM | POA: Diagnosis not present

## 2023-07-28 DIAGNOSIS — E1122 Type 2 diabetes mellitus with diabetic chronic kidney disease: Secondary | ICD-10-CM

## 2023-07-28 DIAGNOSIS — N183 Chronic kidney disease, stage 3 unspecified: Secondary | ICD-10-CM | POA: Diagnosis not present

## 2023-07-28 DIAGNOSIS — Z79899 Other long term (current) drug therapy: Secondary | ICD-10-CM | POA: Diagnosis not present

## 2023-07-28 DIAGNOSIS — G4733 Obstructive sleep apnea (adult) (pediatric): Secondary | ICD-10-CM

## 2023-07-28 DIAGNOSIS — E1169 Type 2 diabetes mellitus with other specified complication: Secondary | ICD-10-CM

## 2023-07-28 DIAGNOSIS — E785 Hyperlipidemia, unspecified: Secondary | ICD-10-CM | POA: Diagnosis not present

## 2023-07-28 DIAGNOSIS — L989 Disorder of the skin and subcutaneous tissue, unspecified: Secondary | ICD-10-CM | POA: Diagnosis not present

## 2023-07-28 DIAGNOSIS — E1159 Type 2 diabetes mellitus with other circulatory complications: Secondary | ICD-10-CM

## 2023-07-28 DIAGNOSIS — E1121 Type 2 diabetes mellitus with diabetic nephropathy: Secondary | ICD-10-CM

## 2023-07-28 DIAGNOSIS — E6609 Other obesity due to excess calories: Secondary | ICD-10-CM

## 2023-07-28 LAB — BAYER DCA HB A1C WAIVED: HB A1C (BAYER DCA - WAIVED): 5.9 % — ABNORMAL HIGH (ref 4.8–5.6)

## 2023-07-28 NOTE — Assessment & Plan Note (Signed)
Ongoing and stable.  Diagnosed on 01/20/22, continue collaboration with rheumatology and Plaquenil as ordered by them, she is noticing improved symptoms.  Recommend massage therapy added to regimen and continue to visit with chiropractor.

## 2023-07-28 NOTE — Assessment & Plan Note (Addendum)
Chronic, ongoing CKD 3a with stable levels.  Continue Telmisartan for kidney protection.  LABS: CMP.  Continue collaboration with nephrology, appreciate their input.  Recent note reviewed.

## 2023-07-28 NOTE — Assessment & Plan Note (Signed)
Chronic, stable.  BP at goal in office today for age.  Recommend she check BP at home three mornings a week and document for provider.  Continue Telmisartan 40 MG daily, discussed with her today.  LABS: CMP.  Educated her on Telmisartan and kidney benefit.  Urine micro alb 07 December 2022.  Focus on DASH diet at home.  Return in 3 months.

## 2023-07-28 NOTE — Assessment & Plan Note (Signed)
BMI 35.19 with T2DM, HTN/HLD.  Recommended eating smaller high protein, low fat meals more frequently and exercising 30 mins a day 5 times a week with a goal of 10-15lb weight loss in the next 3 months. Patient voiced their understanding and motivation to adhere to these recommendations.

## 2023-07-28 NOTE — Assessment & Plan Note (Signed)
Continue collaboration with rheumatology, labs up to date and recent note reviewed.

## 2023-07-28 NOTE — Progress Notes (Signed)
BP 134/73   Pulse 90   Temp 98.5 F (36.9 C) (Oral)   Wt 205 lb (93 kg)   LMP  (LMP Unknown)   SpO2 95%   BMI 35.19 kg/m    Subjective:    Patient ID: Kim Thomas, female    DOB: May 02, 1956, 67 y.o.   MRN: [id number]  HPI: Kim Thomas is a 67 y.o. female  Chief Complaint  Patient presents with   Chronic Kidney Disease   Rheumatoid Arthritis   Diabetes   Hyperlipidemia   Hypertension   DIABETES A1c in May 5.6%.  Continues on Ozempic 2 MG weekly.  Stopped Jardiance in 2022 due to stable A1c.  Endorses traveling recently to see her children [place] and [place], she was able to maintain her diet. Did do some walking in the airport, had Covid during time of travel.   Taking Gabapentin 400 MG TID for neuropathy.  She reports going to start taking 3 a day, was taking. Hypoglycemic episodes: no Polydipsia/polyuria: no Visual disturbance: no Chest pain: no Paresthesias: no Glucose Monitoring: yes             Accucheck frequency: has not checked as was traveling             Fasting glucose:              Post prandial:             Evening:              Before meals:  Taking Insulin?: no             Long acting insulin:             Short acting insulin: Blood Pressure Monitoring: not checking Retinal Examination: Up To Date Foot Exam: Up to Date Pneumovax: Up To Date Influenza: refuses Aspirin: yes  HYPERTENSION / HYPERLIPIDEMIA Continues on Atorvastatin 20 MG daily and Telmisartan 20 MG.  Satisfied with current treatment? yes Duration of hypertension: chronic BP monitoring frequency: none BP range:  BP medication side effects: no Duration of hyperlipidemia: chronic Cholesterol medication side effects: no Cholesterol supplements: none Medication compliance: good compliance Aspirin: yes Recent stressors: no Recurrent headaches: no Visual changes: no Palpitations: no Dyspnea: no Chest pain: no Lower extremity edema: no Dizzy/lightheaded: no The 10-year  ASCVD risk score (Arnett DK, et al., 2019) is: 18.6%   Values used to calculate the score:     Age: 39 years     Sex: Female     Is Non-Hispanic African American: Yes     Diabetic: Yes     Tobacco smoker: No     Systolic Blood Pressure: 134 mmHg     Is BP treated: Yes     HDL Cholesterol: 78 mg/dL     Total Cholesterol: 146 mg/dL   CHRONIC KIDNEY DISEASE (CKD 3a) Follows with nephrology, Dr. Thedore Mins, last on 08/18/22 -- scheduled next on 08/21/23. History of elevation in uric acid levels, no current medications. CrCl 77. CKD status: stable Medications renally dose: yes Previous renal evaluation: yes Pneumovax:  Up To Date Influenza Vaccine:  Refuses    Latest Ref Rng & Units 04/19/2023   10:43 AM 12/26/2022    2:27 PM 10/19/2022   11:42 AM  BMP  Glucose 65 - 99 mg/dL 83  76  80   BUN 7 - 25 mg/dL 13  13  15    Creatinine 0.50 - 1.05 mg/dL 4.01  0.27  2.53  BUN/Creat Ratio 6 - 22 (calc) SEE NOTE:  12  SEE NOTE:   Sodium 135 - 146 mmol/L 142  140  141   Potassium 3.5 - 5.3 mmol/L 4.0  4.0  4.2   Chloride 98 - 110 mmol/L 108  104  106   CO2 20 - 32 mmol/L 23  22  28    Calcium 8.6 - 10.4 mg/dL 9.1  9.0  9.1    RHEUMATOID ARTHRITIS Rheumatology visit on 04/19/23, continues Plaquenil but they have reduced this in 1/2 (200 MG).   Duration: months Pain: no Context:  better Decreased function/range of motion: improved Erythema: no Swelling: no Heat or warmth: no Morning stiffness: stable Aggravating factors: none Alleviating factors: Plaquenil Relief with NSAIDs?: No NSAIDs Taken Treatments attempted:  Parafin wax, Tylenol, Plaquenil, CBD cream Involved Joints:     Hands: yes bilateral    Wrists: none    Elbows: none    Shoulders: none    Back: yes     Hips: none    Ankles: none    Feet: none  SKIN LESION Reports new lesion to bra line left side.   Duration: days Location: bra line Painful: no Itching: no Onset: sudden Context: not changing Associated signs and  symptoms:  History of skin cancer: no History of precancerous skin lesions: no Family history of skin cancer: no    Relevant past medical, surgical, family and social history reviewed and updated as indicated. Interim medical history since our last visit reviewed. Allergies and medications reviewed and updated.  Review of Systems  Constitutional:  Negative for activity change, appetite change, diaphoresis, fatigue and fever.  Respiratory:  Negative for cough, chest tightness and shortness of breath.   Cardiovascular:  Negative for chest pain, palpitations and leg swelling.  Gastrointestinal: Negative.   Endocrine: Negative for cold intolerance, heat intolerance, polydipsia, polyphagia and polyuria.  Neurological: Negative.   Psychiatric/Behavioral: Negative.     Per HPI unless specifically indicated above     Objective:    BP 134/73   Pulse 90   Temp 98.5 F (36.9 C) (Oral)   Wt 205 lb (93 kg)   LMP  (LMP Unknown)   SpO2 95%   BMI 35.19 kg/m   Wt Readings from Last 3 Encounters:  07/28/23 205 lb (93 kg)  07/07/23 203 lb 3.2 oz (92.2 kg)  05/19/23 209 lb 3.2 oz (94.9 kg)    Physical Exam Vitals and nursing note reviewed.  Constitutional:      General: She is awake. She is not in acute distress.    Appearance: She is well-developed. She is obese. She is not ill-appearing.  HENT:     Head: Normocephalic.     Right Ear: Hearing normal. No drainage.     Left Ear: Hearing normal. No drainage.  Eyes:     General: Lids are normal.        Right eye: No discharge.        Left eye: No discharge.     Conjunctiva/sclera: Conjunctivae normal.     Pupils: Pupils are equal, round, and reactive to light.  Neck:     Vascular: No carotid bruit.  Cardiovascular:     Rate and Rhythm: Normal rate and regular rhythm.     Heart sounds: Normal heart sounds. No murmur heard.    No gallop.  Pulmonary:     Effort: Pulmonary effort is normal. No accessory muscle usage or respiratory  distress.     Breath sounds: Normal  breath sounds.  Abdominal:     General: Bowel sounds are normal.     Palpations: Abdomen is soft.  Musculoskeletal:     Cervical back: Normal range of motion and neck supple.     Right lower leg: No edema.     Left lower leg: No edema.  Skin:    General: Skin is warm and dry.     Findings: Lesion present.       Neurological:     Mental Status: She is alert and oriented to person, place, and time.  Psychiatric:        Attention and Perception: Attention normal.        Mood and Affect: Mood normal.        Speech: Speech normal.        Behavior: Behavior normal. Behavior is cooperative.        Thought Content: Thought content normal.    Diabetic Foot Exam - Simple   Simple Foot Form Visual Inspection See comments: Yes Sensation Testing Intact to touch and monofilament testing bilaterally: Yes Pulse Check Posterior Tibialis and Dorsalis pulse intact bilaterally: Yes Comments Bunions to both feet.     Results for orders placed or performed in visit on 07/07/23  Novel Coronavirus, NAA ([facility])   Specimen: Nasopharyngeal(NP) swabs in vial transport medium  Result Value Ref Range   SARS-CoV-2, NAA Detected (A) Not Detected      Assessment & Plan:   Problem List Items Addressed This Visit       Cardiovascular and Mediastinum   Hypertension associated with diabetes (HCC)    Chronic, stable.  BP at goal in office today for age.  Recommend she check BP at home three mornings a week and document for provider.  Continue Telmisartan 40 MG daily, discussed with her today.  LABS: CMP.  Educated her on Telmisartan and kidney benefit.  Urine micro alb 07 December 2022.  Focus on DASH diet at home.  Return in 3 months.      Relevant Orders   Bayer DCA Hb A1c Waived   Comprehensive metabolic panel     Endocrine   CKD stage 3 due to type 2 diabetes mellitus (HCC)    Chronic, ongoing CKD 3a with stable levels.  Continue Telmisartan for kidney  protection.  LABS: CMP.  Continue collaboration with nephrology, appreciate their input.  Recent note reviewed.       Relevant Orders   Bayer DCA Hb A1c Waived   Comprehensive metabolic panel   Hyperlipidemia associated with type 2 diabetes mellitus (HCC)    Chronic, ongoing.  Continue current medication regimen and adjust as needed.  Lipid panel today.      Relevant Orders   Bayer DCA Hb A1c Waived   Comprehensive metabolic panel   Lipid Panel w/o Chol/HDL Ratio   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  A1c 5.9% today, praised for ongoing success with A1c.  Urine ALB 10 (January 2024), continue Telmisartan for kidney protection.  Will continue Ozempic 2 MG weekly which is offering ongoing benefit to diabetes and weight + reducing food noise and assisting with weight loss, she is to notify provider if levels on BS get <70 consistently.  Continue off Jardiance as is tolerating this. Continue increased diet focus and monitoring BS twice a day.  Recommend focus on healthy weight loss and exercise daily.  Have recommended if any consistent lows <70 immediately alert provider so can adjust  medication.  Return in 3 months. - On ARB and Statin - Foot and eye exam up to date - Vaccinations, refuses flu vaccine.      Relevant Orders   Bayer DCA Hb A1c Waived     Musculoskeletal and Integument   Seropositive rheumatoid arthritis (HCC)    Ongoing and stable.  Diagnosed on 01/20/22, continue collaboration with rheumatology and Plaquenil as ordered by them, she is noticing improved symptoms.  Recommend massage therapy added to regimen and continue to visit with chiropractor.      Relevant Orders   Comprehensive metabolic panel     Other   Long-term use of hydroxychloroquine    Continue collaboration with rheumatology, labs up to date and recent note reviewed.      Relevant Orders   Comprehensive metabolic panel   Obesity    BMI 35.19 with  T2DM, HTN/HLD.  Recommended eating smaller high protein, low fat meals more frequently and exercising 30 mins a day 5 times a week with a goal of 10-15lb weight loss in the next 3 months. Patient voiced their understanding and motivation to adhere to these recommendations.       Other Visit Diagnoses     Skin lesion       To left upper back under bra line, referral placed to dermatology   Relevant Orders   Ambulatory referral to Dermatology        Follow up plan: Return in about 3 months (around 10/28/2023) for T2DM, HTN/HLD, CKD, RA.

## 2023-07-28 NOTE — Assessment & Plan Note (Addendum)
Chronic, ongoing.  A1c 5.9% today, praised for ongoing success with A1c.  Urine ALB 10 (January 2024), continue Telmisartan for kidney protection.  Will continue Ozempic 2 MG weekly which is offering ongoing benefit to diabetes and weight + reducing food noise and assisting with weight loss, she is to notify provider if levels on BS get <70 consistently.  Continue off Jardiance as is tolerating this. Continue increased diet focus and monitoring BS twice a day.  Recommend focus on healthy weight loss and exercise daily.  Have recommended if any consistent lows <70 immediately alert provider so can adjust medication.  Return in 3 months. - On ARB and Statin - Foot and eye exam up to date - Vaccinations, refuses flu vaccine.

## 2023-07-28 NOTE — Assessment & Plan Note (Signed)
Chronic, ongoing.  Continue current medication regimen and adjust as needed. Lipid panel today. 

## 2023-07-29 LAB — COMPREHENSIVE METABOLIC PANEL
ALT: 13 IU/L (ref 0–32)
AST: 20 IU/L (ref 0–40)
Albumin: 4 g/dL (ref 3.9–4.9)
Alkaline Phosphatase: 76 IU/L (ref 44–121)
BUN/Creatinine Ratio: 11 — ABNORMAL LOW (ref 12–28)
BUN: 12 mg/dL (ref 8–27)
Bilirubin Total: 0.5 mg/dL (ref 0.0–1.2)
CO2: 19 mmol/L — ABNORMAL LOW (ref 20–29)
Calcium: 9.2 mg/dL (ref 8.7–10.3)
Chloride: 108 mmol/L — ABNORMAL HIGH (ref 96–106)
Creatinine, Ser: 1.08 mg/dL — ABNORMAL HIGH (ref 0.57–1.00)
Globulin, Total: 2.7 g/dL (ref 1.5–4.5)
Glucose: 95 mg/dL (ref 70–99)
Potassium: 3.8 mmol/L (ref 3.5–5.2)
Sodium: 143 mmol/L (ref 134–144)
Total Protein: 6.7 g/dL (ref 6.0–8.5)
eGFR: 56 mL/min/{1.73_m2} — ABNORMAL LOW (ref >59)

## 2023-07-29 LAB — LIPID PANEL W/O CHOL/HDL RATIO
Cholesterol, Total: 132 mg/dL (ref 100–199)
HDL: 63 mg/dL (ref >39)
LDL Chol Calc (NIH): 52 mg/dL (ref 0–99)
Triglycerides: 92 mg/dL (ref 0–149)
VLDL Cholesterol Cal: 17 mg/dL (ref 5–40)

## 2023-07-30 NOTE — Progress Notes (Signed)
Good morning Geoffrey, your labs have returned and overall remain stable.  Kidney function continues to show stage 3a kidney disease with no worsening.  No medication changes at this time.  Great news!! Keep being amazing!!  Thank you for allowing me to participate in your care.  I appreciate you. Kindest regards, Vonnie Spagnolo

## 2023-08-14 DIAGNOSIS — H40013 Open angle with borderline findings, low risk, bilateral: Secondary | ICD-10-CM | POA: Diagnosis not present

## 2023-08-14 DIAGNOSIS — Z79899 Other long term (current) drug therapy: Secondary | ICD-10-CM | POA: Diagnosis not present

## 2023-08-14 DIAGNOSIS — E119 Type 2 diabetes mellitus without complications: Secondary | ICD-10-CM | POA: Diagnosis not present

## 2023-08-14 DIAGNOSIS — H539 Unspecified visual disturbance: Secondary | ICD-10-CM | POA: Diagnosis not present

## 2023-08-14 LAB — HM DIABETES EYE EXAM

## 2023-08-16 ENCOUNTER — Encounter: Payer: Self-pay | Admitting: Nurse Practitioner

## 2023-08-21 DIAGNOSIS — R600 Localized edema: Secondary | ICD-10-CM | POA: Diagnosis not present

## 2023-08-21 DIAGNOSIS — N1831 Chronic kidney disease, stage 3a: Secondary | ICD-10-CM | POA: Diagnosis not present

## 2023-08-21 DIAGNOSIS — E8722 Chronic metabolic acidosis: Secondary | ICD-10-CM | POA: Diagnosis not present

## 2023-08-21 DIAGNOSIS — E1122 Type 2 diabetes mellitus with diabetic chronic kidney disease: Secondary | ICD-10-CM | POA: Diagnosis not present

## 2023-08-21 DIAGNOSIS — I10 Essential (primary) hypertension: Secondary | ICD-10-CM | POA: Diagnosis not present

## 2023-08-22 ENCOUNTER — Ambulatory Visit: Payer: 59

## 2023-08-22 VITALS — Ht 64.0 in | Wt 206.0 lb

## 2023-08-22 DIAGNOSIS — Z0000 Encounter for general adult medical examination without abnormal findings: Secondary | ICD-10-CM

## 2023-08-22 NOTE — Progress Notes (Signed)
Subjective:   Kim Thomas is a 67 y.o. female who presents for Medicare Annual (Subsequent) preventive examination.  Visit Complete: Virtual  I connected with  Elyn Aquas on 08/22/23 by a audio enabled telemedicine application and verified that I am speaking with the correct person using two identifiers.  Patient Location: Home  Provider Location: Office/Clinic  I discussed the limitations of evaluation and management by telemedicine. The patient expressed understanding and agreed to proceed.  Vital Signs: Because this visit was a virtual/telehealth visit, some criteria may be missing or patient reported. Any vitals not documented were not able to be obtained and vitals that have been documented are patient reported.    Cardiac Risk Factors include: advanced age (>70men, >1 women);diabetes mellitus;dyslipidemia;hypertension;obesity (BMI >30kg/m2)     Objective:    Today's Vitals   08/22/23 1120  Weight: 206 lb (93.4 kg)  Height: 5\' 4"  (1.626 m)   Body mass index is 35.36 kg/m.     08/22/2023   11:34 AM 08/16/2021   10:32 AM 08/14/2020   10:45 AM 08/14/2019    9:57 AM 01/03/2019    9:08 AM 12/13/2018   10:17 AM 09/13/2017    1:33 PM  Advanced Directives  Does Patient Have a Medical Advance Directive? Yes Yes Yes Yes Yes Yes Yes  Type of Estate agent of [place];Living will Healthcare Power of [place];Living will Healthcare Power of [place];Living will Living will;Healthcare Power of [employer] Power of [place];Living will Healthcare Power of [place];Living will Healthcare Power of [place];Living will  Does patient want to make changes to medical advance directive? No - Patient declined     No - Patient declined   Copy of Healthcare Power of Attorney in Chart? No - copy requested No - copy requested No - copy requested No - copy requested  No - copy requested     Current Medications (verified) Outpatient Encounter Medications as of  08/22/2023  Medication Sig   acetaminophen (TYLENOL) 500 MG tablet Take 500 mg by mouth daily as needed.   ascorbic acid (VITAMIN C) 1000 MG tablet Take by mouth.   Aspirin (VAZALORE) 81 MG CAPS Take by mouth.   atorvastatin (LIPITOR) 20 MG tablet TAKE 1 TABLET BY MOUTH ONCE  DAILY   Blood Glucose Monitoring Suppl (ONE TOUCH ULTRA 2) w/Device KIT USE TO CHECK BLOOD SUGAR 3  TO 4 TIMES DAILY AND  DOCUMENT FOR VISITS   CALCIUM CITRATE PO Take 1,000 mg by mouth daily.   Cholecalciferol (VITAMIN D3) 2000 units TABS Take 2,000 Units by mouth daily.   Co-Enzyme Q-10 100 MG CAPS Take 50 mg by mouth daily.   EQ ALLERGY RELIEF, CETIRIZINE, 10 MG tablet Take 1 tablet by mouth once daily   fluticasone (FLONASE) 50 MCG/ACT nasal spray USE 1 TO 2 SPRAYS IN BOTH  NOSTRILS TWICE DAILY   gabapentin (NEURONTIN) 400 MG capsule Take 1 capsule (400 mg total) by mouth 3 (three) times daily.   glucose blood (ONETOUCH ULTRA) test strip USE TO CHECK BLOOD SUGAR DAILY   hydroxychloroquine (PLAQUENIL) 200 MG tablet Take 1.5 tablets (300 mg total) by mouth daily.   Lancets (ONETOUCH DELICA PLUS LANCET30G) MISC 1 each by Does not apply route 2 (two) times daily.   Magnesium 400 MG TABS Take by mouth.   Omega-3 1000 MG CAPS Take 1 capsule by mouth daily.   omeprazole (PRILOSEC) 20 MG capsule Take 1 capsule (20 mg total) by mouth daily.   Potassium 99 MG TABS Take  by mouth.   Semaglutide, 2 MG/DOSE, 8 MG/3ML SOPN Inject 2 mg as directed once a week.   telmisartan (MICARDIS) 40 MG tablet Take 1 tablet (40 mg total) by mouth daily.   triamcinolone cream (KENALOG) 0.1 % Apply 1 Application topically 2 (two) times daily.   vitamin E 100 UNIT capsule Take 100 Units by mouth daily.   No facility-administered encounter medications on file as of 08/22/2023.    Allergies (verified) Morphine and codeine, Erythromycin, and Hydrocodone-acetaminophen   History: Past Medical History:  Diagnosis Date   Anxiety    Chronic kidney  disease    DDD (degenerative disc disease), cervical    Diabetes mellitus without complication (HCC)    Gout    Hypertension    Oxygen deficiency    Seropositive rheumatoid arthritis (HCC)    Sleep apnea    Past Surgical History:  Procedure Laterality Date   BREAST BIOPSY Left    benign   BREAST CYST EXCISION Left    c section     x2   CESAREAN SECTION     CHOLECYSTECTOMY     EYE SURGERY Bilateral 2019   catracts Dr.Hecter in     FOOT SURGERY Bilateral    KNEE ARTHROSCOPY Left 07/18/2016   Procedure: ARTHROSCOPY KNEE;  Surgeon: Donato Heinz, MD;  Location: [facility] ORS;  Service: Orthopedics;  Laterality: Left;   KNEE ARTHROSCOPY WITH LATERAL MENISECTOMY Left 07/18/2016   Procedure: KNEE ARTHROSCOPY WITH PARTIAL  LATERAL MENISECTOMY, CHONDROPLASTY LATERAL FEMORAL CONDYLE;  Surgeon: Donato Heinz, MD;  Location: [facility] ORS;  Service: Orthopedics;  Laterality: Left;   SPINE SURGERY  11/09/2011   C5   TUBAL LIGATION     Family History  Problem Relation Age of Onset   Cancer Mother        breast   Hypertension Mother    Hyperlipidemia Mother    Diabetes Mother    Glaucoma Mother    Cataracts Mother    Breast cancer Mother 108   Cancer Father    Heart disease Sister    Glaucoma Maternal Aunt    Breast cancer Maternal Aunt    Cancer Maternal Grandfather    Breast cancer Cousin    Alcohol abuse Neg Hx    Social History   Socioeconomic History   Marital status: Single    Spouse name: Not on file   Number of children: 2   Years of education: Not on file   Highest education level: Bachelor's degree (e.g., BA, AB, BS)  Occupational History   Occupation: retired    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  Tobacco Use   Smoking status: Former    Current packs/day: 0.00    Average packs/day: 0.2 packs/day for 39.5 years (9.9 ttl pk-yrs)    Types: Cigarettes    Start date: 41    Quit date: 05/21/2011    Years since quitting: 12.2    Passive exposure:  Never   Smokeless tobacco: Never  Vaping Use   Vaping status: Never Used  Substance and Sexual Activity   Alcohol use: No    Alcohol/week: 0.0 standard drinks of alcohol   Drug use: No   Sexual activity: Not Currently  Other Topics Concern   Not on file  Social History Narrative   Never been married, 2 children   Social Determinants of Health   Financial Resource Strain: Low Risk  (08/22/2023)   Overall Financial Resource Strain (CARDIA)    Difficulty of Paying Living Expenses:  Not hard at all  Food Insecurity: No Food Insecurity (08/22/2023)   Hunger Vital Sign    Worried About Running Out of Food in the Last Year: Never true    Ran Out of Food in the Last Year: Never true  Transportation Needs: No Transportation Needs (08/22/2023)   PRAPARE - Administrator, Civil Service (Medical): No    Lack of Transportation (Non-Medical): No  Physical Activity: Sufficiently Active (08/22/2023)   Exercise Vital Sign    Days of Exercise per Week: 3 days    Minutes of Exercise per Session: 120 min  Stress: No Stress Concern Present (08/22/2023)   [employer] of Occupational Health - Occupational Stress Questionnaire    Feeling of Stress : Not at all  Social Connections: Moderately Isolated (08/22/2023)   Social Connection and Isolation Panel [NHANES]    Frequency of Communication with Friends and Family: More than three times a week    Frequency of Social Gatherings with Friends and Family: More than three times a week    Attends Religious Services: More than 4 times per year    Active Member of [employer] or Organizations: No    Attends Engineer, structural: Never    Marital Status: Never married    Tobacco Counseling Counseling given: Not Answered   Clinical Intake:  Pre-visit preparation completed: Yes  Pain : No/denies pain     BMI - recorded: 35.36 Nutritional Status: BMI > 30  Obese Nutritional Risks: None Diabetes: Yes CBG done?: No Did pt. bring  in CBG monitor from home?: No  How often do you need to have someone help you when you read instructions, pamphlets, or other written materials from your doctor or pharmacy?: 1 - Never  Interpreter Needed?: No  Information entered by :: Tora Kindred, CMA   Activities of Daily Living    08/22/2023   11:23 AM  In your present state of health, do you have any difficulty performing the following activities:  Hearing? 0  Vision? 0  Difficulty concentrating or making decisions? 0  Walking or climbing stairs? 1  Comment uses cane or walker if needed  Dressing or bathing? 0  Doing errands, shopping? 0  Preparing Food and eating ? N  Using the Toilet? N  In the past six months, have you accidently leaked urine? N  Do you have problems with loss of bowel control? N  Managing your Medications? N  Managing your Finances? N  Housekeeping or managing your Housekeeping? N    Patient Care Team: Marjie Skiff, NP as PCP - General (Nurse Practitioner) Morene Crocker, MD as Referring Physician (Neurology) Tera Partridge, PA as Physician Assistant (Orthopedic Surgery) Ernest Pine Illene Labrador, MD (Orthopedic Surgery)  Indicate any recent Medical Services you may have received from other than [facility] providers in the past year (date may be approximate).     Assessment:   This is a routine wellness examination for [facility].  Hearing/Vision screen Hearing Screening - Comments:: Denies hearing loss Vision Screening - Comments:: Gets routine eye exams   Goals Addressed               This Visit's Progress     Patient Stated (pt-stated)        Lose 15lbs      Depression Screen    08/22/2023   11:31 AM 07/28/2023   10:27 AM 07/07/2023    9:29 AM 05/19/2023    8:14 AM 04/21/2023    9:07  AM 12/26/2022    2:26 PM 09/20/2022    9:03 AM  PHQ 2/9 Scores  PHQ - 2 Score 0 0 0 0 0 0 0  PHQ- 9 Score 0 0 0 0 0 0 0    Fall Risk    08/22/2023   11:36 AM 07/28/2023   10:27 AM 07/07/2023    9:29 AM  05/19/2023    8:14 AM 04/21/2023    9:06 AM  Fall Risk   Falls in the past year? 0 0 0 0 0  Number falls in past yr: 0 0 0 0 0  Injury with Fall? 0 0 0 0 0  Risk for fall due to : No Fall Risks No Fall Risks No Fall Risks No Fall Risks No Fall Risks  Follow up Falls prevention discussed Falls evaluation completed Falls evaluation completed      MEDICARE RISK AT HOME: Medicare Risk at Home Any stairs in or around the home?: Yes If so, are there any without handrails?: No Home free of loose throw rugs in walkways, pet beds, electrical cords, etc?: Yes Adequate lighting in your home to reduce risk of falls?: Yes Life alert?: Yes Use of a cane, walker or w/c?: Yes (if needed) Grab bars in the bathroom?: Yes Shower chair or bench in shower?: Yes Elevated toilet seat or a handicapped toilet?: No  TIMED UP AND GO:  Was the test performed?  No    Cognitive Function:        08/22/2023   11:37 AM 08/18/2022   10:39 AM 08/16/2021   10:36 AM 08/14/2020   10:52 AM 05/04/2018    8:18 AM  6CIT Screen  What Year? 0 points 0 points 0 points 0 points 0 points  What month? 0 points 0 points 0 points 0 points 0 points  What time? 0 points 0 points 0 points 0 points 0 points  Count back from 20 0 points 0 points 0 points 0 points 0 points  Months in reverse 2 points 0 points 0 points 0 points 0 points  Repeat phrase 0 points 0 points 0 points 0 points 0 points  Total Score 2 points 0 points 0 points 0 points 0 points    Immunizations Immunization History  Administered Date(s) Administered   PFIZER(Purple Top)SARS-COV-2 Vaccination 03/28/2020, 04/18/2020   PNEUMOCOCCAL CONJUGATE-20 09/20/2022   Pneumococcal Conjugate-13 08/09/2021   Td 04/22/2015   Zoster Recombinant(Shingrix) 02/23/2017, 06/16/2017    TDAP status: Up to date  Flu Vaccine status: Declined, Education has been provided regarding the importance of this vaccine but patient still declined. Advised may receive this vaccine at  local pharmacy or Health Dept. Aware to provide a copy of the vaccination record if obtained from local pharmacy or Health Dept. Verbalized acceptance and understanding.  Pneumococcal vaccine status: Up to date  Covid-19 vaccine status: Information provided on how to obtain vaccines.   Qualifies for Shingles Vaccine? Yes   Zostavax completed No   Shingrix Completed?: Yes  Screening Tests Health Maintenance  Topic Date Due   COVID-19 Vaccine (3 - Pfizer risk series) 05/16/2020   Colonoscopy  11/29/2023   INFLUENZA VACCINE  02/26/2024 (Originally 06/29/2023)   Diabetic kidney evaluation - Urine ACR  12/27/2023   HEMOGLOBIN A1C  01/26/2024   MAMMOGRAM  03/23/2024   Diabetic kidney evaluation - eGFR measurement  07/27/2024   FOOT EXAM  07/27/2024   OPHTHALMOLOGY EXAM  08/13/2024   Medicare Annual Wellness (AWV)  08/21/2024   DEXA SCAN  01/27/2025   DTaP/Tdap/Td (2 - Tdap) 04/21/2025   Pneumonia Vaccine 7+ Years old  Completed   Hepatitis C Screening  Completed   Zoster Vaccines- Shingrix  Completed   HPV VACCINES  Aged Out    Health Maintenance  Health Maintenance Due  Topic Date Due   COVID-19 Vaccine (3 - Pfizer risk series) 05/16/2020   Colonoscopy  11/29/2023    Colorectal cancer screening: Type of screening: Colonoscopy. Completed 11/28/13. Repeat every 10 years  Mammogram status: Completed 03/04/23. Repeat every year  Bone Density status: Completed 01/28/20. Results reflect: Bone density results: OSTEOPENIA. Repeat every 5 years.  Lung Cancer Screening: (Low Dose CT Chest recommended if Age 59-80 years, 20 pack-year currently smoking OR have quit w/in 15years.) does not qualify.   Lung Cancer Screening Referral: n/a  Additional Screening:  Hepatitis C Screening: does not qualify; Completed 06/30/2017  Vision Screening: Recommended annual ophthalmology exams for early detection of glaucoma and other disorders of the eye. Is the patient up to date with their annual eye  exam?  Yes  Who is the provider or what is the name of the office in which the patient attends annual eye exams? [facility] If pt is not established with a provider, would they like to be referred to a provider to establish care? No .   Dental Screening: Recommended annual dental exams for proper oral hygiene  Diabetic Foot Exam: Diabetic Foot Exam: Completed 06/2023  Community Resource Referral / Chronic Care Management: CRR required this visit?  No   CCM required this visit?  No     Plan:     I have personally reviewed and noted the following in the patient's chart:   Medical and social history Use of alcohol, tobacco or illicit drugs  Current medications and supplements including opioid prescriptions. Patient is not currently taking opioid prescriptions. Functional ability and status Nutritional status Physical activity Advanced directives List of other physicians Hospitalizations, surgeries, and ER visits in previous 12 months Vitals Screenings to include cognitive, depression, and falls Referrals and appointments  In addition, I have reviewed and discussed with patient certain preventive protocols, quality metrics, and best practice recommendations. A written personalized care plan for preventive services as well as general preventive health recommendations were provided to patient.     Tora Kindred, CMA   08/22/2023   After Visit Summary: (MyChart) Due to this being a telephonic visit, the after visit summary with patients personalized plan was offered to patient via MyChart   Nurse Notes:  6 CIT Score - 2 Declined referral for DM & Nutrition education. Declined referral to GI for colonoscopy due 11/2023. Declined flu vaccine. Undecided if she will get the covid booster.

## 2023-08-22 NOTE — Patient Instructions (Addendum)
Kim Thomas , Thank you for taking time to come for your Medicare Wellness Visit. I appreciate your ongoing commitment to your health goals. Please review the following plan we discussed and let me know if I can assist you in the future.   Referrals/Orders/Follow-Ups/Clinician Recommendations: Consider getting a covid booster.  This is a list of the screening recommended for you and due dates:  Health Maintenance  Topic Date Due   COVID-19 Vaccine (3 - Pfizer risk series) 05/16/2020   Colon Cancer Screening  11/29/2023   Flu Shot  02/26/2024*   Yearly kidney health urinalysis for diabetes  12/27/2023   Hemoglobin A1C  01/26/2024   Mammogram  03/23/2024   Yearly kidney function blood test for diabetes  07/27/2024   Complete foot exam   07/27/2024   Eye exam for diabetics  08/13/2024   Medicare Annual Wellness Visit  08/21/2024   DEXA scan (bone density measurement)  01/27/2025   DTaP/Tdap/Td vaccine (2 - Tdap) 04/21/2025   Pneumonia Vaccine  Completed   Hepatitis C Screening  Completed   Zoster (Shingles) Vaccine  Completed   HPV Vaccine  Aged Out  *Topic was postponed. The date shown is not the original due date.    Advanced directives: (Copy Requested) Please bring a copy of your health care power of attorney and living will to the office to be added to your chart at your convenience.  Next Medicare Annual Wellness Visit scheduled for next year: Yes, 08/27/24 @ 10:30am

## 2023-09-01 ENCOUNTER — Other Ambulatory Visit: Payer: Self-pay | Admitting: Internal Medicine

## 2023-09-01 DIAGNOSIS — R768 Other specified abnormal immunological findings in serum: Secondary | ICD-10-CM

## 2023-09-01 DIAGNOSIS — M059 Rheumatoid arthritis with rheumatoid factor, unspecified: Secondary | ICD-10-CM

## 2023-09-01 DIAGNOSIS — M79641 Pain in right hand: Secondary | ICD-10-CM

## 2023-09-04 DIAGNOSIS — L821 Other seborrheic keratosis: Secondary | ICD-10-CM | POA: Diagnosis not present

## 2023-09-04 NOTE — Telephone Encounter (Signed)
Last Fill: 05/09/2023  Eye exam: 05/04/2022 normal    Labs: 07/28/2023 Creat. 1.08, GFR 56, BUN/Creat. Ratio 11, Chloride 108, CO2 19  Next Visit: 10/19/2023  Last Visit: 04/19/2023  [id number] rheumatoid arthritis   Current Dose per office note 04/19/2023: hydroxychloroquine 200 mg once daily   Patient has updated her PLQ eye exam and will have eye doctor send results.   Okay to refill Plaquenil?

## 2023-09-12 DIAGNOSIS — M5416 Radiculopathy, lumbar region: Secondary | ICD-10-CM | POA: Diagnosis not present

## 2023-09-12 DIAGNOSIS — M5432 Sciatica, left side: Secondary | ICD-10-CM | POA: Diagnosis not present

## 2023-09-12 DIAGNOSIS — M9903 Segmental and somatic dysfunction of lumbar region: Secondary | ICD-10-CM | POA: Diagnosis not present

## 2023-09-12 DIAGNOSIS — M9905 Segmental and somatic dysfunction of pelvic region: Secondary | ICD-10-CM | POA: Diagnosis not present

## 2023-10-04 ENCOUNTER — Other Ambulatory Visit: Payer: Self-pay | Admitting: Nurse Practitioner

## 2023-10-05 NOTE — Telephone Encounter (Signed)
Requested Prescriptions  Pending Prescriptions Disp Refills   gabapentin (NEURONTIN) 400 MG capsule [Pharmacy Med Name: Gabapentin 400 MG Oral Capsule] 300 capsule 2    Sig: TAKE 1 CAPSULE BY MOUTH 3 TIMES  DAILY     Neurology: Anticonvulsants - gabapentin Failed - 10/04/2023 10:31 PM      Failed - Cr in normal range and within 360 days    Creat  Date Value Ref Range Status  04/19/2023 1.04 0.50 - 1.05 mg/dL Final   Creatinine, Ser  Date Value Ref Range Status  07/28/2023 1.08 (H) 0.57 - 1.00 mg/dL Final         Passed - Completed PHQ-2 or PHQ-9 in the last 360 days      Passed - Valid encounter within last 12 months    Recent Outpatient Visits           2 months ago Type 2 diabetes mellitus with diabetic nephropathy, without long-term current use of insulin (HCC)   [place] [facility] [place], [place] T, NP   3 months ago Congestion of paranasal sinus   [place] [facility] [place], Sherran Needs, NP   4 months ago Type 2 diabetes mellitus with diabetic nephropathy, without long-term current use of insulin (HCC)   [place] [facility] [place], [place] T, NP   5 months ago Type 2 diabetes mellitus with diabetic nephropathy, without long-term current use of insulin (HCC)   [place] [facility] [place], [place] T, NP   9 months ago Type 2 diabetes mellitus with diabetic nephropathy, without long-term current use of insulin (HCC)   [place] [facility] [place], Dorie Rank, NP       Future Appointments             In 2 weeks [place], Jamesetta [place], MD [facility] - A Dept Of [place]. [facility]   In 3 weeks [place], Dorie Rank, NP [place] [facility], [place]
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
## 2023-10-10 DIAGNOSIS — M9903 Segmental and somatic dysfunction of lumbar region: Secondary | ICD-10-CM | POA: Diagnosis not present

## 2023-10-10 DIAGNOSIS — M9905 Segmental and somatic dysfunction of pelvic region: Secondary | ICD-10-CM | POA: Diagnosis not present

## 2023-10-10 DIAGNOSIS — M5432 Sciatica, left side: Secondary | ICD-10-CM | POA: Diagnosis not present

## 2023-10-10 DIAGNOSIS — M5416 Radiculopathy, lumbar region: Secondary | ICD-10-CM | POA: Diagnosis not present

## 2023-10-19 ENCOUNTER — Encounter: Payer: Self-pay | Admitting: Internal Medicine

## 2023-10-19 ENCOUNTER — Ambulatory Visit: Payer: 59 | Attending: Internal Medicine | Admitting: Internal Medicine

## 2023-10-19 VITALS — BP 160/80 | HR 83 | Resp 14 | Ht 64.0 in | Wt 205.0 lb

## 2023-10-19 DIAGNOSIS — R768 Other specified abnormal immunological findings in serum: Secondary | ICD-10-CM | POA: Diagnosis not present

## 2023-10-19 DIAGNOSIS — M79641 Pain in right hand: Secondary | ICD-10-CM | POA: Diagnosis not present

## 2023-10-19 DIAGNOSIS — M059 Rheumatoid arthritis with rheumatoid factor, unspecified: Secondary | ICD-10-CM

## 2023-10-19 DIAGNOSIS — Z79899 Other long term (current) drug therapy: Secondary | ICD-10-CM | POA: Diagnosis not present

## 2023-10-19 DIAGNOSIS — E1121 Type 2 diabetes mellitus with diabetic nephropathy: Secondary | ICD-10-CM | POA: Diagnosis not present

## 2023-10-19 DIAGNOSIS — M79642 Pain in left hand: Secondary | ICD-10-CM | POA: Diagnosis not present

## 2023-10-27 NOTE — Patient Instructions (Incomplete)
Be Involved in Caring For Your Health:  Taking Medications When medications are taken as directed, they can greatly improve your health. But if they are not taken as prescribed, they may not work. In some cases, not taking them correctly can be harmful. To help ensure your treatment remains effective and safe, understand your medications and how to take them. Bring your medications to each visit for review by your provider.  Your lab results, notes, and after visit summary will be available on My Chart. We strongly encourage you to use this feature. If lab results are abnormal the clinic will contact you with the appropriate steps. If the clinic does not contact you assume the results are satisfactory. You can always view your results on My Chart. If you have questions regarding your health or results, please contact the clinic during office hours. You can also ask questions on My Chart.  We at [facility] are grateful that you chose [place] to provide your care. We strive to provide evidence-based and compassionate care and are always looking for feedback. If you get a survey from the clinic please complete this so we can hear your opinions.  Diabetes Mellitus and Foot Care Diabetes, also called diabetes mellitus, may cause problems with your feet and legs because of poor blood flow (circulation). Poor circulation may make your skin: Become thinner and drier. Break more easily. Heal more slowly. Peel and crack. You may also have nerve damage (neuropathy). This can cause decreased feeling in your legs and feet. This means that you may not notice minor injuries to your feet that could lead to more serious problems. Finding and treating problems early is the best way to prevent future foot problems. How to care for your feet Foot hygiene  Wash your feet daily with warm water and mild soap. Do not use hot water. Then, pat your feet and the areas between your toes until they are fully dry. Do  not soak your feet. This can dry your skin. Trim your toenails straight across. Do not dig under them or around the cuticle. File the edges of your nails with an emery board or nail file. Apply a moisturizing lotion or petroleum jelly to the skin on your feet and to dry, brittle toenails. Use lotion that does not contain alcohol and is unscented. Do not apply lotion between your toes. Shoes and socks Wear clean socks or stockings every day. Make sure they are not too tight. Do not wear knee-high stockings. These may decrease blood flow to your legs. Wear shoes that fit well and have enough cushioning. Always look in your shoes before you put them on to be sure there are no objects inside. To break in new shoes, wear them for just a few hours a day. This prevents injuries on your feet. Wounds, scrapes, corns, and calluses  Check your feet daily for blisters, cuts, bruises, sores, and redness. If you cannot see the bottom of your feet, use a mirror or ask someone for help. Do not cut off corns or calluses or try to remove them with medicine. If you find a minor scrape, cut, or break in the skin on your feet, keep it and the skin around it clean and dry. You may clean these areas with mild soap and water. Do not clean the area with peroxide, alcohol, or iodine. If you have a wound, scrape, corn, or callus on your foot, look at it several times a day to make sure it  is healing and not infected. Check for: Redness, swelling, or pain. Fluid or blood. Warmth. Pus or a bad smell. General tips Do not cross your legs. This may decrease blood flow to your feet. Do not use heating pads or hot water bottles on your feet. They may burn your skin. If you have lost feeling in your feet or legs, you may not know this is happening until it is too late. Protect your feet from hot and cold by wearing shoes, such as at the beach or on hot pavement. Schedule a complete foot exam at least once a year or more often if  you have foot problems. Report any cuts, sores, or bruises to your health care provider right away. Where to find more information American Diabetes Association: diabetes.org Association of Diabetes Care & Education Specialists: diabeteseducator.org Contact a health care provider if: You have a condition that increases your risk of infection, and you have any cuts, sores, or bruises on your feet. You have an injury that is not healing. You have redness on your legs or feet. You feel burning or tingling in your legs or feet. You have pain or cramps in your legs and feet. Your legs or feet are numb. Your feet always feel cold. You have pain around any toenails. Get help right away if: You have a wound, scrape, corn, or callus on your foot and: You have signs of infection. You have a fever. You have a red line going up your leg. This information is not intended to replace advice given to you by your health care provider. Make sure you discuss any questions you have with your health care provider. Document Revised: 05/18/2022 Document Reviewed: 05/18/2022 Elsevier Patient Education  2024 [employer].

## 2023-10-30 ENCOUNTER — Ambulatory Visit: Payer: 59 | Admitting: Nurse Practitioner

## 2023-10-30 ENCOUNTER — Encounter: Payer: Self-pay | Admitting: Nurse Practitioner

## 2023-10-30 VITALS — BP 137/73 | HR 91 | Temp 98.4°F | Ht 64.0 in | Wt 204.6 lb

## 2023-10-30 DIAGNOSIS — N183 Chronic kidney disease, stage 3 unspecified: Secondary | ICD-10-CM

## 2023-10-30 DIAGNOSIS — Z79899 Other long term (current) drug therapy: Secondary | ICD-10-CM

## 2023-10-30 DIAGNOSIS — E559 Vitamin D deficiency, unspecified: Secondary | ICD-10-CM | POA: Diagnosis not present

## 2023-10-30 DIAGNOSIS — M85852 Other specified disorders of bone density and structure, left thigh: Secondary | ICD-10-CM

## 2023-10-30 DIAGNOSIS — M059 Rheumatoid arthritis with rheumatoid factor, unspecified: Secondary | ICD-10-CM | POA: Diagnosis not present

## 2023-10-30 DIAGNOSIS — E785 Hyperlipidemia, unspecified: Secondary | ICD-10-CM | POA: Diagnosis not present

## 2023-10-30 DIAGNOSIS — M5442 Lumbago with sciatica, left side: Secondary | ICD-10-CM

## 2023-10-30 DIAGNOSIS — E1169 Type 2 diabetes mellitus with other specified complication: Secondary | ICD-10-CM | POA: Diagnosis not present

## 2023-10-30 DIAGNOSIS — E1121 Type 2 diabetes mellitus with diabetic nephropathy: Secondary | ICD-10-CM

## 2023-10-30 DIAGNOSIS — E1159 Type 2 diabetes mellitus with other circulatory complications: Secondary | ICD-10-CM

## 2023-10-30 DIAGNOSIS — I152 Hypertension secondary to endocrine disorders: Secondary | ICD-10-CM

## 2023-10-30 DIAGNOSIS — E1122 Type 2 diabetes mellitus with diabetic chronic kidney disease: Secondary | ICD-10-CM | POA: Diagnosis not present

## 2023-10-30 DIAGNOSIS — E66811 Obesity, class 1: Secondary | ICD-10-CM

## 2023-10-30 DIAGNOSIS — G8929 Other chronic pain: Secondary | ICD-10-CM

## 2023-10-30 LAB — BAYER DCA HB A1C WAIVED: HB A1C (BAYER DCA - WAIVED): 5.3 % (ref 4.8–5.6)

## 2023-10-30 MED ORDER — TELMISARTAN 80 MG PO TABS: 80.0000 mg | ORAL_TABLET | Freq: Every day | ORAL | 4 refills | Status: DC

## 2023-10-30 NOTE — Assessment & Plan Note (Signed)
BMI 35.12 with T2DM, HTN/HLD.  Recommended eating smaller high protein, low fat meals more frequently and exercising 30 mins a day 5 times a week with a goal of 10-15lb weight loss in the next 3 months. Patient voiced their understanding and motivation to adhere to these recommendations.

## 2023-10-30 NOTE — Assessment & Plan Note (Signed)
Continue collaboration with rheumatology, labs and recent note reviewed.  ESR today.

## 2023-10-30 NOTE — Assessment & Plan Note (Signed)
Ongoing with underlying osteopenia, continue daily supplement and check Vit D level today. 

## 2023-10-30 NOTE — Assessment & Plan Note (Signed)
Chronic, ongoing.  Continue current medication regimen and adjust as needed. Lipid panel today. 

## 2023-10-30 NOTE — Assessment & Plan Note (Signed)
Chronic, ongoing CKD 3a with stable levels.  Continue Telmisartan for kidney protection.  LABS: CMP, CBC.  Continue collaboration with nephrology, appreciate their input.  Recent note reviewed.

## 2023-10-30 NOTE — Assessment & Plan Note (Signed)
Ongoing.  Noted on past DEXA -- continue supplements daily and check Vit D level today.  Plan for repeat DEXA in 2026.  Discussed fall prevention.

## 2023-10-30 NOTE — Assessment & Plan Note (Signed)
Ongoing and stable.  Diagnosed on 01/20/22, continue collaboration with rheumatology and Plaquenil as ordered by them, she is noticing improved symptoms.  Recommend massage therapy added to regimen and continue to visit with chiropractor.

## 2023-10-30 NOTE — Progress Notes (Signed)
BP 137/73   Pulse 91   Temp 98.4 F (36.9 C) (Oral)   Ht 5\' 4"  (1.626 m)   Wt 204 lb 9.6 oz (92.8 kg)   LMP  (LMP Unknown)   SpO2 98%   BMI 35.12 kg/m    Subjective:    Patient ID: Kim Thomas, female    DOB: Oct 09, 1956, 67 y.o.   MRN: [id number]  HPI: Kim Thomas is a 67 y.o. female  Chief Complaint  Patient presents with   Arthritis   Chronic Kidney Disease   Diabetes   Hyperlipidemia   Hypertension   DIABETES A1c 5.9% in August, continues to have stable control.  Taking Ozempic 2 MG weekly.  Jardiance stopped in 2022 due to stable A1c.  Taking Gabapentin 400 MG TID for neuropathy.   Hypoglycemic episodes: no Polydipsia/polyuria: no Visual disturbance: no Chest pain: no Paresthesias: no Glucose Monitoring: yes             Accucheck frequency:              Fasting glucose: 99 to 110             Post prandial:             Evening:              Before meals:  Taking Insulin?: no             Long acting insulin:             Short acting insulin: Blood Pressure Monitoring: not checking Retinal Examination: Up To Date Foot Exam: Up to Date Pneumovax: Up To Date Influenza: refuses Aspirin: yes  HYPERTENSION / HYPERLIPIDEMIA Taking Atorvastatin 20 MG daily and Telmisartan 20 MG.  Satisfied with current treatment? yes Duration of hypertension: chronic BP monitoring frequency: daily BP range: 123 to 138/63 to 79 BP medication side effects: no Duration of hyperlipidemia: chronic Cholesterol medication side effects: no Cholesterol supplements: none Medication compliance: good compliance Aspirin: yes Recent stressors: no Recurrent headaches: no Visual changes: no Palpitations: no Dyspnea: no Chest pain: no Lower extremity edema: no Dizzy/lightheaded: no The 10-year ASCVD risk score (Arnett DK, et al., 2019) is: 18.2%   Values used to calculate the score:     Age: 89 years     Sex: Female     Is Non-Hispanic African American: Yes     Diabetic: Yes      Tobacco smoker: No     Systolic Blood Pressure: 137 mmHg     Is BP treated: Yes     HDL Cholesterol: 63 mg/dL     Total Cholesterol: 132 mg/dL   CHRONIC KIDNEY DISEASE (CKD 3a) Saw nephrology, Dr. Thedore Mins, last on 08/21/23 - no changes made. History of elevation in uric acid levels, no current medications.  CKD status: stable Medications renally dose: yes Previous renal evaluation: yes Pneumovax:  Up To Date Influenza Vaccine:  Refuses    Latest Ref Rng & Units 07/28/2023   10:29 AM 04/19/2023   10:43 AM 12/26/2022    2:27 PM  BMP  Glucose 70 - 99 mg/dL 95  83  76   BUN 8 - 27 mg/dL 12  13  13    Creatinine 0.57 - 1.00 mg/dL 7.82  9.56  2.13   BUN/Creat Ratio 12 - 28 11  SEE NOTE:  12   Sodium 134 - 144 mmol/L 143  142  140   Potassium 3.5 - 5.2 mmol/L 3.8  4.0  4.0   Chloride 96 - 106 mmol/L 108  108  104   CO2 20 - 29 mmol/L 19  23  22    Calcium 8.7 - 10.3 mg/dL 9.2  9.1  9.0    RHEUMATOID ARTHRITIS Continues Plaquenil. Saw rheumatology last 10/19/23. Has not been walking as her lower back has been bothering her -- has ordered a side sleeper pillow.  Has had chronic low back pain, but feels it may be worsening a little.  Goes to chiropractor. MRI in 2021 noted arthritis and bulging discs.  Went to PT [facility] sports rehab last year with benefit.  Continues on Vitamin D supplement due to history of low level and osteopenia. Duration: months Pain: no Context:  better Decreased function/range of motion: improved Erythema: no Swelling: no Heat or warmth: no Morning stiffness: stable Aggravating factors: none Alleviating factors: Plaquenil Relief with NSAIDs?: No NSAIDs Taken Treatments attempted:  Parafin wax, Tylenol, Plaquenil, CBD cream Involved Joints:     Hands: yes bilateral    Wrists: none    Elbows: none    Shoulders: none    Back: yes     Hips: none    Ankles: none    Feet: none  Relevant past medical, surgical, family and social history reviewed and updated as  indicated. Interim medical history since our last visit reviewed. Allergies and medications reviewed and updated.  Review of Systems  Constitutional:  Negative for activity change, appetite change, diaphoresis, fatigue and fever.  Respiratory:  Negative for cough, chest tightness and shortness of breath.   Cardiovascular:  Negative for chest pain, palpitations and leg swelling.  Gastrointestinal: Negative.   Endocrine: Negative for cold intolerance, heat intolerance, polydipsia, polyphagia and polyuria.  Musculoskeletal:  Positive for back pain.  Neurological: Negative.   Psychiatric/Behavioral: Negative.     Per HPI unless specifically indicated above     Objective:    BP 137/73   Pulse 91   Temp 98.4 F (36.9 C) (Oral)   Ht 5\' 4"  (1.626 m)   Wt 204 lb 9.6 oz (92.8 kg)   LMP  (LMP Unknown)   SpO2 98%   BMI 35.12 kg/m   Wt Readings from Last 3 Encounters:  10/30/23 204 lb 9.6 oz (92.8 kg)  10/19/23 205 lb (93 kg)  08/22/23 206 lb (93.4 kg)    Physical Exam Vitals and nursing note reviewed.  Constitutional:      General: She is awake. She is not in acute distress.    Appearance: Normal appearance. She is well-developed and well-groomed. She is obese. She is not ill-appearing or toxic-appearing.  HENT:     Head: Normocephalic.     Right Ear: Hearing and external ear normal.     Left Ear: Hearing and external ear normal.  Eyes:     General: Lids are normal.        Right eye: No discharge.        Left eye: No discharge.     Conjunctiva/sclera: Conjunctivae normal.     Pupils: Pupils are equal, round, and reactive to light.  Neck:     Thyroid: No thyromegaly.     Vascular: No carotid bruit.  Cardiovascular:     Rate and Rhythm: Normal rate and regular rhythm.     Heart sounds: Normal heart sounds. No murmur heard.    No gallop.  Pulmonary:     Effort: Pulmonary effort is normal. No accessory muscle usage or respiratory distress.     Breath sounds:  Normal breath  sounds.  Abdominal:     General: Bowel sounds are normal. There is no distension.     Palpations: Abdomen is soft.     Tenderness: There is no abdominal tenderness.  Musculoskeletal:     Cervical back: Normal range of motion and neck supple.     Right lower leg: No edema.     Left lower leg: No edema.  Lymphadenopathy:     Cervical: No cervical adenopathy.  Skin:    General: Skin is warm and dry.  Neurological:     Mental Status: She is alert and oriented to person, place, and time.     Deep Tendon Reflexes: Reflexes are normal and symmetric.     Reflex Scores:      Brachioradialis reflexes are 2+ on the right side and 2+ on the left side.      Patellar reflexes are 2+ on the right side and 2+ on the left side. Psychiatric:        Attention and Perception: Attention normal.        Mood and Affect: Mood normal.        Speech: Speech normal.        Behavior: Behavior normal. Behavior is cooperative.        Thought Content: Thought content normal.    Results for orders placed or performed in visit on 10/30/23  Bayer DCA Hb A1c Waived  Result Value Ref Range   HB A1C (BAYER DCA - WAIVED) 5.3 4.8 - 5.6 %      Assessment & Plan:   Problem List Items Addressed This Visit       Cardiovascular and Mediastinum   Hypertension associated with diabetes (HCC)    Chronic, stable.  BP slightly above goal in office and at home on occasion.  She would like to try to increase BP medication.  Recommend she check BP at home three mornings a week and document for provider.  Increase Telmisartan to 80 MG daily, discussed with her today.  LABS: CMP, CBC.  Educated her on Telmisartan and kidney benefit.  Urine micro alb 07 December 2022.  Focus on DASH diet at home.  Return in 4 weeks for BP check.      Relevant Medications   telmisartan (MICARDIS) 80 MG tablet   Other Relevant Orders   Bayer DCA Hb A1c Waived (Completed)   CBC with Differential/Platelet   Comprehensive metabolic panel      Endocrine   CKD stage 3 due to type 2 diabetes mellitus (HCC)    Chronic, ongoing CKD 3a with stable levels.  Continue Telmisartan for kidney protection.  LABS: CMP, CBC.  Continue collaboration with nephrology, appreciate their input.  Recent note reviewed.       Relevant Medications   telmisartan (MICARDIS) 80 MG tablet   Other Relevant Orders   Bayer DCA Hb A1c Waived (Completed)   Hyperlipidemia associated with type 2 diabetes mellitus (HCC)    Chronic, ongoing.  Continue current medication regimen and adjust as needed.  Lipid panel today.      Relevant Medications   telmisartan (MICARDIS) 80 MG tablet   Other Relevant Orders   Bayer DCA Hb A1c Waived (Completed)   Comprehensive metabolic panel   Lipid Panel w/o Chol/HDL Ratio   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  A1c 5.3% today, praised for ongoing success with A1c.  Urine ALB 10 (January 2024), continue Telmisartan for kidney  protection.  Will continue Ozempic 2 MG weekly which is offering ongoing benefit to diabetes and weight + reducing food noise and assisting with weight loss, she is to notify provider if levels on BS get <70 consistently.  Continue off Jardiance as is tolerating this. Continue increased diet focus and monitoring BS twice a day.  Recommend focus on healthy weight loss and exercise daily.  Have recommended if any consistent lows <70 immediately alert provider so can adjust medication.  Return in 3 months. - On ARB and Statin - Foot and eye exam up to date - Vaccinations, refuses flu vaccine.      Relevant Medications   telmisartan (MICARDIS) 80 MG tablet   Other Relevant Orders   Bayer DCA Hb A1c Waived (Completed)     Musculoskeletal and Integument   Osteopenia of neck of left femur    Ongoing.  Noted on past DEXA -- continue supplements daily and check Vit D level today.  Plan for repeat DEXA in 2026.  Discussed fall prevention.        Seropositive  rheumatoid arthritis (HCC)    Ongoing and stable.  Diagnosed on 01/20/22, continue collaboration with rheumatology and Plaquenil as ordered by them, she is noticing improved symptoms.  Recommend massage therapy added to regimen and continue to visit with chiropractor.      Relevant Orders   Sed Rate (ESR)     Other   Chronic left-sided low back pain    Chronic, with current acute flare.  Currently taking Tylenol 500 MG BID, discussed with her she can increase to 1000 MG BID with max dose for age 26 MG daily.  Recommend she try OTC Icy/Hot patches + ensure regular stretching.  Continue to work with chiropractor and taking Gabapentin.  Referral to PT placed after discussion with patient.      Relevant Orders   Ambulatory referral to Physical Therapy   Long-term use of hydroxychloroquine    Continue collaboration with rheumatology, labs and recent note reviewed.  ESR today.      Obesity    BMI 35.12 with T2DM, HTN/HLD.  Recommended eating smaller high protein, low fat meals more frequently and exercising 30 mins a day 5 times a week with a goal of 10-15lb weight loss in the next 3 months. Patient voiced their understanding and motivation to adhere to these recommendations.       Vitamin D deficiency    Ongoing with underlying osteopenia, continue daily supplement and check Vit D level today.      Relevant Orders   VITAMIN D 25 Hydroxy (Vit-D Deficiency, Fractures)     Follow up plan: Return in about 4 weeks (around 11/27/2023) for HTN -- increased Telmisartan to 80 MG + 3 month follow-up for Annual physical.

## 2023-10-30 NOTE — Assessment & Plan Note (Signed)
Chronic, with current acute flare.  Currently taking Tylenol 500 MG BID, discussed with her she can increase to 1000 MG BID with max dose for age 67 MG daily.  Recommend she try OTC Icy/Hot patches + ensure regular stretching.  Continue to work with chiropractor and taking Gabapentin.  Referral to PT placed after discussion with patient.

## 2023-10-30 NOTE — Assessment & Plan Note (Signed)
Chronic, ongoing.  A1c 5.3% today, praised for ongoing success with A1c.  Urine ALB 10 (January 2024), continue Telmisartan for kidney protection.  Will continue Ozempic 2 MG weekly which is offering ongoing benefit to diabetes and weight + reducing food noise and assisting with weight loss, she is to notify provider if levels on BS get <70 consistently.  Continue off Jardiance as is tolerating this. Continue increased diet focus and monitoring BS twice a day.  Recommend focus on healthy weight loss and exercise daily.  Have recommended if any consistent lows <70 immediately alert provider so can adjust medication.  Return in 3 months. - On ARB and Statin - Foot and eye exam up to date - Vaccinations, refuses flu vaccine.

## 2023-10-30 NOTE — Assessment & Plan Note (Signed)
Chronic, stable.  BP slightly above goal in office and at home on occasion.  She would like to try to increase BP medication.  Recommend she check BP at home three mornings a week and document for provider.  Increase Telmisartan to 80 MG daily, discussed with her today.  LABS: CMP, CBC.  Educated her on Telmisartan and kidney benefit.  Urine micro alb 07 December 2022.  Focus on DASH diet at home.  Return in 4 weeks for BP check.

## 2023-10-31 LAB — SEDIMENTATION RATE: Sed Rate: 9 mm/h (ref 0–40)

## 2023-10-31 LAB — CBC WITH DIFFERENTIAL/PLATELET
Basophils Absolute: 0.1 10*3/uL (ref 0.0–0.2)
Basos: 1 %
EOS (ABSOLUTE): 0 10*3/uL (ref 0.0–0.4)
Eos: 1 %
Hematocrit: 36.7 % (ref 34.0–46.6)
Hemoglobin: 11.9 g/dL (ref 11.1–15.9)
Immature Grans (Abs): 0 10*3/uL (ref 0.0–0.1)
Immature Granulocytes: 0 %
Lymphocytes Absolute: 1.5 10*3/uL (ref 0.7–3.1)
Lymphs: 34 %
MCH: 30.2 pg (ref 26.6–33.0)
MCHC: 32.4 g/dL (ref 31.5–35.7)
MCV: 93 fL (ref 79–97)
Monocytes Absolute: 0.4 10*3/uL (ref 0.1–0.9)
Monocytes: 9 %
Neutrophils Absolute: 2.3 10*3/uL (ref 1.4–7.0)
Neutrophils: 55 %
Platelets: 221 10*3/uL (ref 150–450)
RBC: 3.94 x10E6/uL (ref 3.77–5.28)
RDW: 12.8 % (ref 11.7–15.4)
WBC: 4.3 10*3/uL (ref 3.4–10.8)

## 2023-10-31 LAB — COMPREHENSIVE METABOLIC PANEL
ALT: 15 [IU]/L (ref 0–32)
AST: 24 [IU]/L (ref 0–40)
Albumin: 3.9 g/dL (ref 3.9–4.9)
Alkaline Phosphatase: 76 [IU]/L (ref 44–121)
BUN/Creatinine Ratio: 11 — ABNORMAL LOW (ref 12–28)
BUN: 12 mg/dL (ref 8–27)
Bilirubin Total: 0.3 mg/dL (ref 0.0–1.2)
CO2: 22 mmol/L (ref 20–29)
Calcium: 8.9 mg/dL (ref 8.7–10.3)
Chloride: 107 mmol/L — ABNORMAL HIGH (ref 96–106)
Creatinine, Ser: 1.1 mg/dL — ABNORMAL HIGH (ref 0.57–1.00)
Globulin, Total: 2.6 g/dL (ref 1.5–4.5)
Glucose: 128 mg/dL — ABNORMAL HIGH (ref 70–99)
Potassium: 3.7 mmol/L (ref 3.5–5.2)
Sodium: 144 mmol/L (ref 134–144)
Total Protein: 6.5 g/dL (ref 6.0–8.5)
eGFR: 55 mL/min/{1.73_m2} — ABNORMAL LOW (ref >59)

## 2023-10-31 LAB — LIPID PANEL W/O CHOL/HDL RATIO
Cholesterol, Total: 148 mg/dL (ref 100–199)
HDL: 83 mg/dL (ref >39)
LDL Chol Calc (NIH): 52 mg/dL (ref 0–99)
Triglycerides: 63 mg/dL (ref 0–149)
VLDL Cholesterol Cal: 13 mg/dL (ref 5–40)

## 2023-10-31 LAB — VITAMIN D 25 HYDROXY (VIT D DEFICIENCY, FRACTURES): Vit D, 25-Hydroxy: 56.9 ng/mL (ref 30.0–100.0)

## 2023-10-31 NOTE — Progress Notes (Signed)
Contacted via MyChart   Good afternoon Kim Thomas, your labs have returned and continue to remain stable.  Kidney function, creatinine and eGFR, continues to show Stage 3a kidney disease. Remainder of labs stable. No medication changes needed.  I will forward to Dr. Dimple Casey as well.  Any questions? Keep being amazing!!  Thank you for allowing me to participate in your care.  I appreciate you. Kindest regards, Kim Thomas

## 2023-10-31 NOTE — Progress Notes (Signed)
Good afternoon, I obtained ESR for you yesterday:) I saw in your notes and a future order for it.  Have a great day!!

## 2023-11-06 ENCOUNTER — Telehealth: Payer: Self-pay

## 2023-11-06 NOTE — Telephone Encounter (Signed)
PA for Ozempic initiated and submitted via Cover My Meds. Key: [id number]

## 2023-11-07 DIAGNOSIS — M5416 Radiculopathy, lumbar region: Secondary | ICD-10-CM | POA: Diagnosis not present

## 2023-11-07 DIAGNOSIS — M9905 Segmental and somatic dysfunction of pelvic region: Secondary | ICD-10-CM | POA: Diagnosis not present

## 2023-11-07 DIAGNOSIS — M5432 Sciatica, left side: Secondary | ICD-10-CM | POA: Diagnosis not present

## 2023-11-07 DIAGNOSIS — M9903 Segmental and somatic dysfunction of lumbar region: Secondary | ICD-10-CM | POA: Diagnosis not present

## 2023-11-07 NOTE — Telephone Encounter (Signed)
PA approved. Called and notified of approval.

## 2023-11-25 NOTE — Patient Instructions (Signed)
 Be Involved in Caring For Your Health:  Taking Medications When medications are taken as directed, they can greatly improve your health. But if they are not taken as prescribed, they may not work. In some cases, not taking them correctly can be harmful. To help ensure your treatment remains effective and safe, understand your medications and how to take them. Bring your medications to each visit for review by your provider.  Your lab results, notes, and after visit summary will be available on My Chart. We strongly encourage you to use this feature. If lab results are abnormal the clinic will contact you with the appropriate steps. If the clinic does not contact you assume the results are satisfactory. You can always view your results on My Chart. If you have questions regarding your health or results, please contact the clinic during office hours. You can also ask questions on My Chart.  We at [facility] are grateful that you chose us  to provide your care. We strive to provide evidence-based and compassionate care and are always looking for feedback. If you get a survey from the clinic please complete this so we can hear your opinions.  DASH Eating Plan DASH stands for Dietary Approaches to Stop Hypertension. The DASH eating plan is a healthy eating plan that has been shown to: Lower high blood pressure (hypertension). Reduce your risk for type 2 diabetes, heart disease, and stroke. Help with weight loss. What are tips for following this plan? Reading food labels Check food labels for the amount of salt (sodium) per serving. Choose foods with less than 5 percent of the Daily Value (DV) of sodium. In general, foods with less than 300 milligrams (mg) of sodium per serving fit into this eating plan. To find whole grains, look for the word whole as the first word in the ingredient list. Shopping Buy products labeled as low-sodium or no salt added. Buy fresh foods. Avoid canned  foods and pre-made or frozen meals. Cooking Try not to add salt when you cook. Use salt-free seasonings or herbs instead of table salt or sea salt. Check with your health care provider or pharmacist before using salt substitutes. Do not fry foods. Cook foods in healthy ways, such as baking, boiling, grilling, roasting, or broiling. Cook using oils that are good for your heart. These include olive, canola, avocado, soybean, and sunflower oil. Meal planning  Eat a balanced diet. This should include: 4 or more servings of fruits and 4 or more servings of vegetables each day. Try to fill half of your plate with fruits and vegetables. 6-8 servings of whole grains each day. 6 or less servings of lean meat, poultry, or fish each day. 1 oz is 1 serving. A 3 oz (85 g) serving of meat is about the same size as the palm of your hand. One egg is 1 oz (28 g). 2-3 servings of low-fat dairy each day. One serving is 1 cup (237 mL). 1 serving of nuts, seeds, or beans 5 times each week. 2-3 servings of heart-healthy fats. Healthy fats called omega-3 fatty acids are found in foods such as walnuts, flaxseeds, fortified milks, and eggs. These fats are also found in cold-water fish, such as sardines, salmon, and mackerel. Limit how much you eat of: Canned or prepackaged foods. Food that is high in trans fat, such as fried foods. Food that is high in saturated fat, such as fatty meat. Desserts and other sweets, sugary drinks, and other foods with added sugar. Full-fat  dairy products. Do not salt foods before eating. Do not eat more than 4 egg yolks a week. Try to eat at least 2 vegetarian meals a week. Eat more home-cooked food and less restaurant, buffet, and fast food. Lifestyle When eating at a restaurant, ask if your food can be made with less salt or no salt. If you drink alcohol: Limit how much you have to: 0-1 drink a day if you are female. 0-2 drinks a day if you are female. Know how much alcohol is in  your drink. In the [place] one drink is one 12 oz bottle of beer (355 mL), one 5 oz glass of wine (148 mL), or one 1 oz glass of hard liquor (44 mL). General information Avoid eating more than 2,300 mg of salt a day. If you have hypertension, you may need to reduce your sodium intake to 1,500 mg a day. Work with your provider to stay at a healthy body weight or lose weight. Ask what the best weight range is for you. On most days of the week, get at least 30 minutes of exercise that causes your heart to beat faster. This may include walking, swimming, or biking. Work with your provider or dietitian to adjust your eating plan to meet your specific calorie needs. What foods should I eat? Fruits All fresh, dried, or frozen fruit. Canned fruits that are in their natural juice and do not have sugar added to them. Vegetables Fresh or frozen vegetables that are raw, steamed, roasted, or grilled. Low-sodium or reduced-sodium tomato and vegetable juice. Low-sodium or reduced-sodium tomato sauce and tomato paste. Low-sodium or reduced-sodium canned vegetables. Grains Whole-grain or whole-wheat bread. Whole-grain or whole-wheat pasta. Brown rice. [place]il Madeira. Bulgur. Whole-grain and low-sodium cereals. Pita bread. Low-fat, low-sodium crackers. Whole-wheat flour tortillas. Meats and other proteins Skinless chicken or [place]. Ground chicken or [place]. Pork with fat trimmed off. Fish and seafood. Egg whites. Dried beans, peas, or lentils. Unsalted nuts, nut butters, and seeds. Unsalted canned beans. Lean cuts of beef with fat trimmed off. Low-sodium, lean precooked or cured meat, such as sausages or meat loaves. Dairy Low-fat (1%) or fat-free (skim) milk. Reduced-fat, low-fat, or fat-free cheeses. Nonfat, low-sodium ricotta or cottage cheese. Low-fat or nonfat yogurt. Low-fat, low-sodium cheese. Fats and oils Soft margarine without trans fats. Vegetable oil. Reduced-fat, low-fat, or light mayonnaise and salad  dressings (reduced-sodium). Canola, safflower, olive, avocado, soybean, and sunflower oils. Avocado. Seasonings and condiments Herbs. Spices. Seasoning mixes without salt. Other foods Unsalted popcorn and pretzels. Fat-free sweets. The items listed above may not be all the foods and drinks you can have. Talk to a dietitian to learn more. What foods should I avoid? Fruits Canned fruit in a light or heavy syrup. Fried fruit. Fruit in cream or butter sauce. Vegetables Creamed or fried vegetables. Vegetables in a cheese sauce. Regular canned vegetables that are not marked as low-sodium or reduced-sodium. Regular canned tomato sauce and paste that are not marked as low-sodium or reduced-sodium. Regular tomato and vegetable juices that are not marked as low-sodium or reduced-sodium. Dene. Olives. Grains Baked goods made with fat, such as croissants, muffins, or some breads. Dry pasta or rice meal packs. Meats and other proteins Fatty cuts of meat. Ribs. Fried meat. [place]. Bologna, salami, and other precooked or cured meats, such as sausages or meat loaves, that are not lean and low in sodium. Fat from the back of a pig (fatback). Bratwurst. Salted nuts and seeds. Canned beans with added salt. Canned  or smoked fish. Whole eggs or egg yolks. Chicken or [place] with skin. Dairy Whole or 2% milk, cream, and half-and-half. Whole or full-fat cream cheese. Whole-fat or sweetened yogurt. Full-fat cheese. Nondairy creamers. Whipped toppings. Processed cheese and cheese spreads. Fats and oils Butter. Stick margarine. Lard. Shortening. Ghee. Bacon fat. Tropical oils, such as coconut, palm kernel, or palm oil. Seasonings and condiments Onion salt, garlic salt, seasoned salt, table salt, and sea salt. Worcestershire sauce. Tartar sauce. Barbecue sauce. Teriyaki sauce. Soy sauce, including reduced-sodium soy sauce. Steak sauce. Canned and packaged gravies. Fish sauce. Oyster sauce. Cocktail sauce. Store-bought  horseradish. Ketchup. Mustard. Meat flavorings and tenderizers. Bouillon cubes. Hot sauces. Pre-made or packaged marinades. Pre-made or packaged taco seasonings. Relishes. Regular salad dressings. Other foods Salted popcorn and pretzels. The items listed above may not be all the foods and drinks you should avoid. Talk to a dietitian to learn more. Where to find more information National Heart, Lung, and Blood Institute (NHLBI): BuffaloDryCleaner.gl American Heart Association (AHA): heart.org Academy of Nutrition and Dietetics: eatright.org National Kidney Foundation (NKF): kidney.org This information is not intended to replace advice given to you by your health care provider. Make sure you discuss any questions you have with your health care provider. Document Revised: 12/01/2022 Document Reviewed: 12/01/2022 Elsevier Patient Education  2024 [employer].

## 2023-11-28 ENCOUNTER — Encounter: Payer: Self-pay | Admitting: Nurse Practitioner

## 2023-11-28 ENCOUNTER — Ambulatory Visit (INDEPENDENT_AMBULATORY_CARE_PROVIDER_SITE_OTHER): Payer: 59 | Admitting: Nurse Practitioner

## 2023-11-28 VITALS — BP 132/74 | HR 84 | Temp 98.2°F | Ht 64.0 in | Wt 205.0 lb

## 2023-11-28 DIAGNOSIS — I152 Hypertension secondary to endocrine disorders: Secondary | ICD-10-CM | POA: Diagnosis not present

## 2023-11-28 DIAGNOSIS — Z7985 Long-term (current) use of injectable non-insulin antidiabetic drugs: Secondary | ICD-10-CM | POA: Diagnosis not present

## 2023-11-28 DIAGNOSIS — E1159 Type 2 diabetes mellitus with other circulatory complications: Secondary | ICD-10-CM | POA: Diagnosis not present

## 2023-11-28 NOTE — Assessment & Plan Note (Signed)
 Chronic, stable.  BP improving at home and in office on recheck today. Recommend she check BP at home three mornings a week and document for provider.  Continue Telmisartan  80 MG daily, discussed with her today.  LABS: up to date.  Educated her on Telmisartan  and kidney benefit.  Urine micro alb 07 December 2022.  Focus on DASH diet at home.

## 2023-11-28 NOTE — Progress Notes (Signed)
 BP 132/74 (BP Location: Left Arm, Patient Position: Sitting)   Pulse 84   Temp 98.2 F (36.8 C) (Oral)   Ht 5' 4 (1.626 m)   Wt 205 lb (93 kg)   LMP  (LMP Unknown)   SpO2 98%   BMI 35.19 kg/m    Subjective:    Patient ID: Kim Thomas, female    DOB: 1956/10/07, 67 y.o.   MRN: [id number]  HPI: Kim Thomas is a 67 y.o. female  Chief Complaint  Patient presents with   Hypertension   HYPERTENSION with Chronic Kidney Disease Follow-up today for increase in Telmisartan  on 10/30/23.  Increased to 80 MG.  At home her BP has improved.  Hypertension status: stable  Satisfied with current treatment? yes Duration of hypertension: chronic BP monitoring frequency:  daily BP range: 119/63 to 130/70 range BP medication side effects:  no Medication compliance: good compliance Aspirin: yes Recurrent headaches: no Visual changes: no Palpitations: no Dyspnea: no Chest pain: no Lower extremity edema: no Dizzy/lightheaded: no   Relevant past medical, surgical, family and social history reviewed and updated as indicated. Interim medical history since our last visit reviewed. Allergies and medications reviewed and updated.  Review of Systems  Constitutional:  Negative for activity change, appetite change, diaphoresis, fatigue and fever.  Respiratory:  Negative for cough, chest tightness and shortness of breath.   Cardiovascular:  Negative for chest pain, palpitations and leg swelling.  Gastrointestinal: Negative.   Neurological: Negative.   Psychiatric/Behavioral: Negative.     Per HPI unless specifically indicated above     Objective:    BP 132/74 (BP Location: Left Arm, Patient Position: Sitting)   Pulse 84   Temp 98.2 F (36.8 C) (Oral)   Ht 5' 4 (1.626 m)   Wt 205 lb (93 kg)   LMP  (LMP Unknown)   SpO2 98%   BMI 35.19 kg/m   Wt Readings from Last 3 Encounters:  11/28/23 205 lb (93 kg)  10/30/23 204 lb 9.6 oz (92.8 kg)  10/19/23 205 lb (93 kg)    Physical  Exam Vitals and nursing note reviewed.  Constitutional:      General: She is awake. She is not in acute distress.    Appearance: Normal appearance. She is well-developed and well-groomed. She is obese. She is not ill-appearing or toxic-appearing.  HENT:     Head: Normocephalic.     Right Ear: Hearing and external ear normal.     Left Ear: Hearing and external ear normal.  Eyes:     General: Lids are normal.        Right eye: No discharge.        Left eye: No discharge.     Conjunctiva/sclera: Conjunctivae normal.     Pupils: Pupils are equal, round, and reactive to light.  Neck:     Thyroid : No thyromegaly.     Vascular: No carotid bruit.  Cardiovascular:     Rate and Rhythm: Normal rate and regular rhythm.     Heart sounds: Normal heart sounds. No murmur heard.    No gallop.  Pulmonary:     Effort: Pulmonary effort is normal. No accessory muscle usage or respiratory distress.     Breath sounds: Normal breath sounds.  Abdominal:     General: Bowel sounds are normal. There is no distension.     Palpations: Abdomen is soft.     Tenderness: There is no abdominal tenderness.  Musculoskeletal:     Cervical back: Normal  range of motion and neck supple.     Right lower leg: No edema.     Left lower leg: No edema.  Lymphadenopathy:     Cervical: No cervical adenopathy.  Skin:    General: Skin is warm and dry.  Neurological:     Mental Status: She is alert and oriented to person, place, and time.     Deep Tendon Reflexes: Reflexes are normal and symmetric.     Reflex Scores:      Brachioradialis reflexes are 2+ on the right side and 2+ on the left side.      Patellar reflexes are 2+ on the right side and 2+ on the left side. Psychiatric:        Attention and Perception: Attention normal.        Mood and Affect: Mood normal.        Speech: Speech normal.        Behavior: Behavior normal. Behavior is cooperative.        Thought Content: Thought content normal.     Results for  orders placed or performed in visit on 10/30/23  Bayer DCA Hb A1c Waived   Collection Time: 10/30/23 10:56 AM  Result Value Ref Range   HB A1C (BAYER DCA - WAIVED) 5.3 4.8 - 5.6 %  CBC with Differential/Platelet   Collection Time: 10/30/23 10:57 AM  Result Value Ref Range   WBC 4.3 3.4 - 10.8 x10E3/uL   RBC 3.94 3.77 - 5.28 x10E6/uL   Hemoglobin 11.9 11.1 - 15.9 g/dL   Hematocrit 63.2 65.9 - 46.6 %   MCV 93 79 - 97 fL   MCH 30.2 26.6 - 33.0 pg   MCHC 32.4 31.5 - 35.7 g/dL   RDW 87.1 88.2 - 84.5 %   Platelets 221 150 - 450 x10E3/uL   Neutrophils 55 Not Estab. %   Lymphs 34 Not Estab. %   Monocytes 9 Not Estab. %   Eos 1 Not Estab. %   Basos 1 Not Estab. %   Neutrophils Absolute 2.3 1.4 - 7.0 x10E3/uL   Lymphocytes Absolute 1.5 0.7 - 3.1 x10E3/uL   Monocytes Absolute 0.4 0.1 - 0.9 x10E3/uL   EOS (ABSOLUTE) 0.0 0.0 - 0.4 x10E3/uL   Basophils Absolute 0.1 0.0 - 0.2 x10E3/uL   Immature Granulocytes 0 Not Estab. %   Immature Grans (Abs) 0.0 0.0 - 0.1 x10E3/uL  Comprehensive metabolic panel   Collection Time: 10/30/23 10:57 AM  Result Value Ref Range   Glucose 128 (H) 70 - 99 mg/dL   BUN 12 8 - 27 mg/dL   Creatinine, Ser 8.89 (H) 0.57 - 1.00 mg/dL   eGFR 55 (L) >40 fO/fpw/8.26   BUN/Creatinine Ratio 11 (L) 12 - 28   Sodium 144 134 - 144 mmol/L   Potassium 3.7 3.5 - 5.2 mmol/L   Chloride 107 (H) 96 - 106 mmol/L   CO2 22 20 - 29 mmol/L   Calcium  8.9 8.7 - 10.3 mg/dL   Total Protein 6.5 6.0 - 8.5 g/dL   Albumin 3.9 3.9 - 4.9 g/dL   Globulin, Total 2.6 1.5 - 4.5 g/dL   Bilirubin Total 0.3 0.0 - 1.2 mg/dL   Alkaline Phosphatase 76 44 - 121 IU/L   AST 24 0 - 40 IU/L   ALT 15 0 - 32 IU/L  Lipid Panel w/o Chol/HDL Ratio   Collection Time: 10/30/23 10:57 AM  Result Value Ref Range   Cholesterol, Total 148 100 - 199 mg/dL  Triglycerides 63 0 - 149 mg/dL   HDL 83 >60 mg/dL   VLDL Cholesterol Cal 13 5 - 40 mg/dL   LDL Chol Calc (NIH) 52 0 - 99 mg/dL  VITAMIN D  25 Hydroxy (Vit-D  Deficiency, Fractures)   Collection Time: 10/30/23 10:57 AM  Result Value Ref Range   Vit D, 25-Hydroxy 56.9 30.0 - 100.0 ng/mL  Sed Rate (ESR)   Collection Time: 10/30/23 10:57 AM  Result Value Ref Range   Sed Rate 9 0 - 40 mm/hr      Assessment & Plan:   Problem List Items Addressed This Visit       Cardiovascular and Mediastinum   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  BP improving at home and in office on recheck today. Recommend she check BP at home three mornings a week and document for provider.  Continue Telmisartan  80 MG daily, discussed with her today.  LABS: up to date.  Educated her on Telmisartan  and kidney benefit.  Urine micro alb 07 December 2022.  Focus on DASH diet at home.          Follow up plan: Return in about 2 months (around 01/29/2024) for as scheduled on March 11th.

## 2023-12-06 ENCOUNTER — Other Ambulatory Visit: Payer: Self-pay | Admitting: Nurse Practitioner

## 2023-12-11 NOTE — Telephone Encounter (Signed)
 Requested Prescriptions  Pending Prescriptions Disp Refills   omeprazole  (PRILOSEC) 20 MG capsule [Pharmacy Med Name: Omeprazole  20 MG Oral Capsule Delayed Release] 100 capsule 2    Sig: TAKE 1 CAPSULE BY MOUTH DAILY     Gastroenterology: Proton Pump Inhibitors Passed - 12/11/2023  9:54 AM      Passed - Valid encounter within last 12 months    Recent Outpatient Visits           1 week ago Hypertension associated with diabetes (HCC)   [place] [facility] [place], Malabar T, NP   1 month ago Type 2 diabetes mellitus with diabetic nephropathy, without long-term current use of insulin  (HCC)   [place] [facility] [place], Hewitt T, NP   4 months ago Type 2 diabetes mellitus with diabetic nephropathy, without long-term current use of insulin  (HCC)   [place] [facility] [place], Verona T, NP   5 months ago Congestion of paranasal sinus   [place] [facility] [place], Hyla Givens, NP   6 months ago Type 2 diabetes mellitus with diabetic nephropathy, without long-term current use of insulin  (HCC)   [place] [facility] [place], Melanie DASEN, NP       Future Appointments             In 1 month Cannady, Melanie DASEN, NP [place] [facility]   In 4 months Rice, Lonni ORN, MD [facility] Rheumatology - A Dept Of [place]. [facility]

## 2023-12-11 NOTE — Telephone Encounter (Signed)
 Patient was informed by [facility] mail order to call PCP and find out the status of omeprazole  (PRILOSEC) 20 MG capsule request. Caller states she has enough pills but would like Rx sent because medication is mailed to her home and she does not want to run out.       [facility] - [place], [place] - [address] [place] [address] Phone: [phone]  Fax: [phone]

## 2024-01-02 ENCOUNTER — Other Ambulatory Visit: Payer: Self-pay | Admitting: Nurse Practitioner

## 2024-01-04 NOTE — Telephone Encounter (Signed)
 Requested Prescriptions  Pending Prescriptions Disp Refills   atorvastatin  (LIPITOR) 20 MG tablet [Pharmacy Med Name: Atorvastatin  Calcium  20 MG Oral Tablet] 100 tablet 2    Sig: TAKE 1 TABLET BY MOUTH ONCE  DAILY     Cardiovascular:  Antilipid - Statins Failed - 01/04/2024 11:23 AM      Failed - Lipid Panel in normal range within the last 12 months    Cholesterol, Total  Date Value Ref Range Status  10/30/2023 148 100 - 199 mg/dL Final   Cholesterol Piccolo, Waived  Date Value Ref Range Status  05/28/2019 150 <200 mg/dL Final    Comment:                            Desirable                <200                         Borderline High      200- 239                         High                     >239    LDL Chol Calc (NIH)  Date Value Ref Range Status  10/30/2023 52 0 - 99 mg/dL Final   HDL  Date Value Ref Range Status  10/30/2023 83 >39 mg/dL Final   Triglycerides  Date Value Ref Range Status  10/30/2023 63 0 - 149 mg/dL Final   Triglycerides Piccolo,Waived  Date Value Ref Range Status  05/28/2019 92 <150 mg/dL Final    Comment:                            Normal                   <150                         Borderline High     150 - 199                         High                200 - 499                         Very High                >499          Passed - Patient is not pregnant      Passed - Valid encounter within last 12 months    Recent Outpatient Visits           1 month ago Hypertension associated with diabetes (HCC)   [place] [facility] [place], [place] T, NP   2 months ago Type 2 diabetes mellitus with diabetic nephropathy, without long-term current use of insulin  (HCC)   [place] [facility] [place], Jolene T, NP   5 months ago Type 2 diabetes mellitus with diabetic nephropathy, without long-term current use of insulin  (HCC)   [place] [facility] [place], [place] T, NP   6 months ago Congestion of  paranasal sinus   [place] [facility] [place], Hyla Givens, NP   7 months ago Type 2 diabetes mellitus with diabetic nephropathy, without long-term current use of insulin  (HCC)   [place] [facility] [place], Melanie DASEN, NP       Future Appointments             In 1 month Cannady, Jolene T, NP [facility], [facility]   In 3 months Rice, Lonni ORN, MD [facility] Rheumatology - A Dept Of [place]. [facility]

## 2024-02-06 ENCOUNTER — Encounter: Payer: Self-pay | Admitting: Nurse Practitioner

## 2024-02-10 ENCOUNTER — Other Ambulatory Visit: Payer: Self-pay | Admitting: Nurse Practitioner

## 2024-02-11 DIAGNOSIS — Z7985 Type 2 diabetes mellitus without complications: Secondary | ICD-10-CM | POA: Insufficient documentation

## 2024-02-11 DIAGNOSIS — E119 Type 2 diabetes mellitus without complications: Secondary | ICD-10-CM | POA: Insufficient documentation

## 2024-02-11 NOTE — Patient Instructions (Signed)
 Be Involved in Caring For Your Health:  Taking Medications When medications are taken as directed, they can greatly improve your health. But if they are not taken as prescribed, they may not work. In some cases, not taking them correctly can be harmful. To help ensure your treatment remains effective and safe, understand your medications and how to take them. Bring your medications to each visit for review by your provider.  Your lab results, notes, and after visit summary will be available on My Chart. We strongly encourage you to use this feature. If lab results are abnormal the clinic will contact you with the appropriate steps. If the clinic does not contact you assume the results are satisfactory. You can always view your results on My Chart. If you have questions regarding your health or results, please contact the clinic during office hours. You can also ask questions on My Chart.  We at [facility] are grateful that you chose [place] to provide your care. We strive to provide evidence-based and compassionate care and are always looking for feedback. If you get a survey from the clinic please complete this so we can hear your opinions.  Diabetes Mellitus and Foot Care Diabetes, also called diabetes mellitus, may cause problems with your feet and legs because of poor blood flow (circulation). Poor circulation may make your skin: Become thinner and drier. Break more easily. Heal more slowly. Peel and crack. You may also have nerve damage (neuropathy). This can cause decreased feeling in your legs and feet. This means that you may not notice minor injuries to your feet that could lead to more serious problems. Finding and treating problems early is the best way to prevent future foot problems. How to care for your feet Foot hygiene  Wash your feet daily with warm water and mild soap. Do not use hot water. Then, pat your feet and the areas between your toes until they are fully dry. Do  not soak your feet. This can dry your skin. Trim your toenails straight across. Do not dig under them or around the cuticle. File the edges of your nails with an emery board or nail file. Apply a moisturizing lotion or petroleum jelly to the skin on your feet and to dry, brittle toenails. Use lotion that does not contain alcohol and is unscented. Do not apply lotion between your toes. Shoes and socks Wear clean socks or stockings every day. Make sure they are not too tight. Do not wear knee-high stockings. These may decrease blood flow to your legs. Wear shoes that fit well and have enough cushioning. Always look in your shoes before you put them on to be sure there are no objects inside. To break in new shoes, wear them for just a few hours a day. This prevents injuries on your feet. Wounds, scrapes, corns, and calluses  Check your feet daily for blisters, cuts, bruises, sores, and redness. If you cannot see the bottom of your feet, use a mirror or ask someone for help. Do not cut off corns or calluses or try to remove them with medicine. If you find a minor scrape, cut, or break in the skin on your feet, keep it and the skin around it clean and dry. You may clean these areas with mild soap and water. Do not clean the area with peroxide, alcohol, or iodine. If you have a wound, scrape, corn, or callus on your foot, look at it several times a day to make sure it  is healing and not infected. Check for: Redness, swelling, or pain. Fluid or blood. Warmth. Pus or a bad smell. General tips Do not cross your legs. This may decrease blood flow to your feet. Do not use heating pads or hot water bottles on your feet. They may burn your skin. If you have lost feeling in your feet or legs, you may not know this is happening until it is too late. Protect your feet from hot and cold by wearing shoes, such as at the beach or on hot pavement. Schedule a complete foot exam at least once a year or more often if  you have foot problems. Report any cuts, sores, or bruises to your health care provider right away. Where to find more information American Diabetes Association: diabetes.org Association of Diabetes Care & Education Specialists: diabeteseducator.org Contact a health care provider if: You have a condition that increases your risk of infection, and you have any cuts, sores, or bruises on your feet. You have an injury that is not healing. You have redness on your legs or feet. You feel burning or tingling in your legs or feet. You have pain or cramps in your legs and feet. Your legs or feet are numb. Your feet always feel cold. You have pain around any toenails. Get help right away if: You have a wound, scrape, corn, or callus on your foot and: You have signs of infection. You have a fever. You have a red line going up your leg. This information is not intended to replace advice given to you by your health care provider. Make sure you discuss any questions you have with your health care provider. Document Revised: 05/18/2022 Document Reviewed: 05/18/2022 Elsevier Patient Education  2024 [employer].

## 2024-02-12 DIAGNOSIS — H40013 Open angle with borderline findings, low risk, bilateral: Secondary | ICD-10-CM | POA: Diagnosis not present

## 2024-02-12 LAB — HM DIABETES EYE EXAM

## 2024-02-13 ENCOUNTER — Encounter: Payer: Self-pay | Admitting: Nurse Practitioner

## 2024-02-13 ENCOUNTER — Ambulatory Visit (INDEPENDENT_AMBULATORY_CARE_PROVIDER_SITE_OTHER): Payer: 59 | Admitting: Nurse Practitioner

## 2024-02-13 VITALS — BP 135/71 | HR 84 | Temp 98.0°F | Ht 64.0 in | Wt 209.8 lb

## 2024-02-13 DIAGNOSIS — E042 Nontoxic multinodular goiter: Secondary | ICD-10-CM

## 2024-02-13 DIAGNOSIS — Z7985 Long-term (current) use of injectable non-insulin antidiabetic drugs: Secondary | ICD-10-CM | POA: Diagnosis not present

## 2024-02-13 DIAGNOSIS — M059 Rheumatoid arthritis with rheumatoid factor, unspecified: Secondary | ICD-10-CM

## 2024-02-13 DIAGNOSIS — K219 Gastro-esophageal reflux disease without esophagitis: Secondary | ICD-10-CM | POA: Diagnosis not present

## 2024-02-13 DIAGNOSIS — Z1211 Encounter for screening for malignant neoplasm of colon: Secondary | ICD-10-CM | POA: Diagnosis not present

## 2024-02-13 DIAGNOSIS — E1121 Type 2 diabetes mellitus with diabetic nephropathy: Secondary | ICD-10-CM

## 2024-02-13 DIAGNOSIS — I152 Hypertension secondary to endocrine disorders: Secondary | ICD-10-CM | POA: Diagnosis not present

## 2024-02-13 DIAGNOSIS — N183 Chronic kidney disease, stage 3 unspecified: Secondary | ICD-10-CM

## 2024-02-13 DIAGNOSIS — Z0000 Encounter for general adult medical examination without abnormal findings: Secondary | ICD-10-CM | POA: Diagnosis not present

## 2024-02-13 DIAGNOSIS — E1122 Type 2 diabetes mellitus with diabetic chronic kidney disease: Secondary | ICD-10-CM | POA: Diagnosis not present

## 2024-02-13 DIAGNOSIS — E119 Type 2 diabetes mellitus without complications: Secondary | ICD-10-CM

## 2024-02-13 DIAGNOSIS — E1159 Type 2 diabetes mellitus with other circulatory complications: Secondary | ICD-10-CM | POA: Diagnosis not present

## 2024-02-13 DIAGNOSIS — E66811 Obesity, class 1: Secondary | ICD-10-CM

## 2024-02-13 DIAGNOSIS — M85852 Other specified disorders of bone density and structure, left thigh: Secondary | ICD-10-CM

## 2024-02-13 DIAGNOSIS — E1169 Type 2 diabetes mellitus with other specified complication: Secondary | ICD-10-CM | POA: Diagnosis not present

## 2024-02-13 DIAGNOSIS — G4733 Obstructive sleep apnea (adult) (pediatric): Secondary | ICD-10-CM

## 2024-02-13 DIAGNOSIS — E790 Hyperuricemia without signs of inflammatory arthritis and tophaceous disease: Secondary | ICD-10-CM

## 2024-02-13 LAB — MICROALBUMIN, URINE WAIVED
Creatinine, Urine Waived: 300 mg/dL (ref 10–300)
Microalb, Ur Waived: 80 mg/L — ABNORMAL HIGH (ref 0–19)

## 2024-02-13 LAB — BAYER DCA HB A1C WAIVED: HB A1C (BAYER DCA - WAIVED): 5.5 % (ref 4.8–5.6)

## 2024-02-13 MED ORDER — SEMAGLUTIDE (2 MG/DOSE) 8 MG/3ML ~~LOC~~ SOPN: 2.0000 mg | PEN_INJECTOR | SUBCUTANEOUS | 4 refills | Status: DC

## 2024-02-13 NOTE — Assessment & Plan Note (Signed)
 BMI 36.01 with T2DM, HTN/HLD. Recommend eating smaller high protein, low fat meals more frequently and continued exercise 30 min a day 5 times a week. Recommended smaller portions of Ritz crackers as night time snack. Patient verbalized understanding and motivation to adhere to these recommendations. Follow up in 3 months.

## 2024-02-13 NOTE — Assessment & Plan Note (Signed)
 Having good results with Ozempic 2 mg weekly. Continue current plan. A1c 5.5% today. Verbalized understanding to notify provider if blood glucose <70 on a consistent basis. Follow up in 3 months.

## 2024-02-13 NOTE — Assessment & Plan Note (Signed)
 Ongoing and stable. Continue collaboration with rheumatology and Plaquenil as ordered. She continues to notice improvement with symptoms. Follow up in 3 months.

## 2024-02-13 NOTE — Assessment & Plan Note (Signed)
 Chronic, ongoing CKD stage 3 with stable levels. Continue Telmisartan for kidney protection. Labs today. Continue collaboration with nephrology. Follow up in 3 months.

## 2024-02-13 NOTE — Assessment & Plan Note (Signed)
 Reports no symptoms. Not using CPAP. Follow up in 3 months.

## 2024-02-13 NOTE — Assessment & Plan Note (Signed)
 Chronic, stable. Blood pressure within normal range at office today. Recommend checking several times weekly at home and documenting. Continue current medication regimen of Telmisartan 80 mg daily. Labs today. Continue to focus on DASH diet at home. Follow up in 3 months.

## 2024-02-13 NOTE — Progress Notes (Signed)
 BP 135/71 (BP Location: Left Arm, Cuff Size: Normal)   Pulse 84   Temp 98 F (36.7 C) (Oral)   Ht 5\' 4"  (1.626 m)   Wt 209 lb 12.8 oz (95.2 kg)   LMP  (LMP Unknown)   SpO2 98%   BMI 36.01 kg/m    Subjective:    Patient ID: Kim Thomas, female    DOB: 1956-02-29, 67 y.o.   MRN: [id number]  NOTE WRITTEN BY DNP STUDENT.  ASSESSMENT AND PLAN OF CARE REVIEWED WITH STUDENT, AGREE WITH ABOVE FINDINGS AND PLAN.  HPI: Kim Thomas is a 68 y.o. female presenting on 02/13/2024 for comprehensive medical examination. Current medical complaints include: Ozempic needs to be refilled.  She currently lives with: alone Menopausal Symptoms: no  DIABETES A1c 5.3% in December.  Takes Ozempic 2 MG weekly.  Jardiance stopped in 2022 due to stable A1c.  Takes Gabapentin 400 MG TID for neuropathy.   Hypoglycemic episodes:no Polydipsia/polyuria: no Visual disturbance: no Chest pain: no Paresthesias: no Glucose Monitoring: yes  Accucheck frequency:  weekly if vision is disturbed  Taking Insulin?: no Blood Pressure Monitoring: weekly Retinal Examination: Up to Date Foot Exam: Not up to Date Diabetic Education: Completed Pneumovax: Up to Date Influenza: Not up to Date Aspirin: yes   HYPERTENSION / HYPERLIPIDEMIA Taking Atorvastatin 20 MG daily and Telmisartan 80 MG.   Satisfied with current treatment? yes Duration of hypertension: chronic BP monitoring frequency: weekly BP medication side effects: no Duration of hyperlipidemia: chronic Cholesterol medication side effects: no Cholesterol supplements: fish oil Medication compliance: good compliance Aspirin: yes Recent stressors: no Recurrent headaches: no Visual changes: no Palpitations: no Dyspnea: no Chest pain: no Lower extremity edema: no Dizzy/lightheaded: no  The 10-year ASCVD risk score (Arnett DK, et al., 2019) is: 20.3%   Values used to calculate the score:     Age: 84 years     Sex: Female     Is Non-Hispanic African  American: Yes     Diabetic: Yes     Tobacco smoker: No     Systolic Blood Pressure: 135 mmHg     Is BP treated: Yes     HDL Cholesterol: 83 mg/dL     Total Cholesterol: 148 mg/dL  CHRONIC KIDNEY DISEASE Follows with nephrology, Dr. Thedore Mins, last on 08/21/23 - no changes made. History of elevation in uric acid levels, no current medications.  Sees rheumatology for seropositive RA, last seen 10/19/23.  Takes Plaquenil. CKD status: stable Medications renally dose: yes Previous renal evaluation: yes Pneumovax:  Up to Date Influenza Vaccine:  Not up to Date   GERD Continues on Omeprazole. GERD control status: stable Satisfied with current treatment? yes Heartburn frequency: rarely Medication side effects: no  Medication compliance: stable Dysphagia: no Odynophagia:  no Hematemesis: no Blood in stool: no EGD: no      07/28/2023   10:27 AM 08/22/2023   11:36 AM 10/30/2023   10:54 AM 11/28/2023   10:43 AM 02/13/2024    1:07 PM  Fall Risk  Falls in the past year? 0 0 0 0 0  Was there an injury with Fall? 0 0 0 0 0  Fall Risk Category Calculator 0 0 0 0 0  Patient at Risk for Falls Due to No Fall Risks No Fall Risks No Fall Risks No Fall Risks No Fall Risks  Fall risk Follow up Falls evaluation completed Falls prevention discussed Falls evaluation completed Falls evaluation completed Falls evaluation completed    Depression Screen done  today and results listed below:     02/13/2024    1:07 PM 11/28/2023   10:43 AM 10/30/2023   10:55 AM 08/22/2023   11:31 AM 07/28/2023   10:27 AM  Depression screen PHQ 2/9  Decreased Interest 0 0 0 0 0  Down, Depressed, Hopeless 0 0 0 0 0  PHQ - 2 Score 0 0 0 0 0  Altered sleeping 0 0 0 0 0  Tired, decreased energy 0 0 0 0 0  Change in appetite 0 0 0 0 0  Feeling bad or failure about yourself  0 0 0 0 0  Trouble concentrating 0 0 0 0 0  Moving slowly or fidgety/restless 0 0 0 0 0  Suicidal thoughts 0 0 0 0 0  PHQ-9 Score 0 0 0 0 0  Difficult  doing work/chores Not difficult at all Not difficult at all Not difficult at all Not difficult at all Not difficult at all      02/13/2024    1:07 PM 11/28/2023   10:43 AM 10/30/2023   10:56 AM 07/28/2023   10:28 AM  GAD 7 : Generalized Anxiety Score  Nervous, Anxious, on Edge 0 0 0 0  Control/stop worrying 0 0 0 0  Worry too much - different things 0 0 0 0  Trouble relaxing 0 0 0 0  Restless 0 0 0 0  Easily annoyed or irritable 0 0 0 0  Afraid - awful might happen 0 0 0 0  Total GAD 7 Score 0 0 0 0  Anxiety Difficulty Not difficult at all Not difficult at all Not difficult at all Not difficult at all    Functional Status Survey: Is the patient deaf or have difficulty hearing?: No Does the patient have difficulty seeing, even when wearing glasses/contacts?: No Does the patient have difficulty concentrating, remembering, or making decisions?: No Does the patient have difficulty walking or climbing stairs?: No Does the patient have difficulty dressing or bathing?: No Does the patient have difficulty doing errands alone such as visiting a doctor's office or shopping?: No   Past Medical History:  Past Medical History:  Diagnosis Date   Allergy    Anxiety    Chronic kidney disease    DDD (degenerative disc disease), cervical    Diabetes mellitus without complication (HCC)    GERD (gastroesophageal reflux disease)    Gout    Hypertension    Oxygen deficiency    Seropositive rheumatoid arthritis (HCC)    Sleep apnea    Surgical History:  Past Surgical History:  Procedure Laterality Date   BREAST BIOPSY Left    benign   BREAST CYST EXCISION Left    c section     x2   CESAREAN SECTION     CHOLECYSTECTOMY     EYE SURGERY Bilateral 2019   catracts Dr.Hecter in [place]    FOOT SURGERY Bilateral    KNEE ARTHROSCOPY Left 07/18/2016   Procedure: ARTHROSCOPY KNEE;  Surgeon: Donato Heinz, MD;  Location: [facility] ORS;  Service: Orthopedics;  Laterality: Left;   KNEE ARTHROSCOPY  WITH LATERAL MENISECTOMY Left 07/18/2016   Procedure: KNEE ARTHROSCOPY WITH PARTIAL  LATERAL MENISECTOMY, CHONDROPLASTY LATERAL FEMORAL CONDYLE;  Surgeon: Donato Heinz, MD;  Location: [facility] ORS;  Service: Orthopedics;  Laterality: Left;   SPINE SURGERY  11/09/2011   C5   TUBAL LIGATION     Medications:  Current Outpatient Medications on File Prior to Visit  Medication Sig   acetaminophen (TYLENOL)  500 MG tablet Take 500 mg by mouth daily as needed.   ascorbic acid (VITAMIN C) 1000 MG tablet Take by mouth.   Aspirin (VAZALORE) 81 MG CAPS Take by mouth.   atorvastatin (LIPITOR) 20 MG tablet TAKE 1 TABLET BY MOUTH ONCE  DAILY   Blood Glucose Monitoring Suppl (ONE TOUCH ULTRA 2) w/Device KIT USE TO CHECK BLOOD SUGAR 3  TO 4 TIMES DAILY AND  DOCUMENT FOR VISITS   CALCIUM CITRATE PO Take 1,000 mg by mouth daily.   Cholecalciferol (VITAMIN D3) 2000 units TABS Take 2,000 Units by mouth daily.   Co-Enzyme Q-10 100 MG CAPS Take 50 mg by mouth daily.   EQ ALLERGY RELIEF, CETIRIZINE, 10 MG tablet Take 1 tablet by mouth once daily   fluticasone (FLONASE) 50 MCG/ACT nasal spray USE 1 TO 2 SPRAYS IN BOTH  NOSTRILS TWICE DAILY   gabapentin (NEURONTIN) 400 MG capsule TAKE 1 CAPSULE BY MOUTH 3 TIMES  DAILY   glucose blood (ONETOUCH ULTRA) test strip USE TO CHECK BLOOD SUGAR DAILY   hydroxychloroquine (PLAQUENIL) 200 MG tablet TAKE 1 TABLET BY MOUTH DAILY   Lancets (ONETOUCH DELICA PLUS LANCET30G) MISC 1 each by Does not apply route 2 (two) times daily.   Magnesium 400 MG TABS Take by mouth.   Omega-3 1000 MG CAPS Take 1 capsule by mouth daily.   omeprazole (PRILOSEC) 20 MG capsule TAKE 1 CAPSULE BY MOUTH DAILY   Potassium 99 MG TABS Take by mouth.   telmisartan (MICARDIS) 80 MG tablet Take 1 tablet (80 mg total) by mouth daily.   triamcinolone cream (KENALOG) 0.1 % Apply 1 Application topically 2 (two) times daily.   vitamin E 100 UNIT capsule Take 100 Units by mouth daily.   No current  facility-administered medications on file prior to visit.    Allergies:  Allergies  Allergen Reactions   Morphine And Codeine Itching    It burned, and made patient itch and go out of her mind.   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  Tobacco Use   Smoking status: Former    Current packs/day: 0.00    Average packs/day: 0.2 packs/day for 39.5 years (9.9 ttl pk-yrs)    Types: Cigarettes    Start date: 86    Quit date: 05/21/2011    Years since quitting: 12.7    Passive exposure: Never   Smokeless tobacco: Never  Vaping Use   Vaping status: Never Used  Substance and Sexual Activity   Alcohol use: No   Drug use: No   Sexual activity: Not Currently  Other Topics Concern   Not on file  Social History Narrative   Never been married, 2 children   Social Drivers of Corporate investment banker Strain: Low Risk  (11/24/2023)   Overall Financial Resource Strain (CARDIA)    Difficulty of Paying Living Expenses: Not hard at all  Food Insecurity: No Food Insecurity (11/24/2023)   Hunger Vital Sign    Worried About Running Out of Food in the Last Year: Never true  Ran Out of Food in the Last Year: Never true  Transportation Needs: No Transportation Needs (11/24/2023)   PRAPARE - Administrator, Civil Service (Medical): No    Lack of Transportation (Non-Medical): No  Physical Activity: Insufficiently Active (11/24/2023)   Exercise Vital Sign    Days of Exercise per Week: 2 days    Minutes of Exercise per Session: 30 min  Stress: No Stress Concern Present  (11/24/2023)   [employer] of Occupational Health - Occupational Stress Questionnaire    Feeling of Stress : Not at all  Social Connections: Moderately Integrated (11/24/2023)   Social Connection and Isolation Panel [NHANES]    Frequency of Communication with Friends and Family: More than three times a week    Frequency of Social Gatherings with Friends and Family: Once a week    Attends Religious Services: More than 4 times per year    Active Member of [employer] or Organizations: Yes    Attends Engineer, structural: More than 4 times per year    Marital Status: Never married  Intimate Partner Violence: Not At Risk (08/22/2023)   Humiliation, Afraid, Rape, and Kick questionnaire    Fear of Current or Ex-Partner: No    Emotionally Abused: No    Physically Abused: No    Sexually Abused: No   Social History   Tobacco Use  Smoking Status Former   Current packs/day: 0.00   Average packs/day: 0.2 packs/day for 39.5 years (9.9 ttl pk-yrs)   Types: Cigarettes   Start date: 64   Quit date: 05/21/2011   Years since quitting: 12.7   Passive exposure: Never  Smokeless Tobacco Never   Social History   Substance and Sexual Activity  Alcohol Use No    Family History:  Family History  Problem Relation Age of Onset   Cancer Mother        breast   Hypertension Mother    Hyperlipidemia Mother    Diabetes Mother    Glaucoma Mother    Cataracts Mother    Breast cancer Mother 26   Arthritis Mother    Cancer Father    Heart disease Sister    Glaucoma Maternal Aunt    Breast cancer Maternal Aunt    Cancer Maternal Grandfather    Breast cancer Cousin    Alcohol abuse Neg Hx     Past medical history, surgical history, medications, allergies, family history and social history reviewed with patient today and changes made to appropriate areas of the chart.   Review of Systems  Constitutional:  Negative for fever and weight loss.  HENT:  Negative for congestion and sinus  pain.   Eyes:  Negative for blurred vision and double vision.  Respiratory:  Negative for cough, shortness of breath and wheezing.   Cardiovascular:  Negative for chest pain, palpitations and leg swelling.  Gastrointestinal:  Negative for constipation, diarrhea, heartburn, nausea and vomiting.  Genitourinary:  Negative for frequency and urgency.  Musculoskeletal:  Negative for falls and joint pain.  Skin:  Negative for itching and rash.  Neurological:  Negative for dizziness and headaches.  Psychiatric/Behavioral:  Negative for depression. The patient is not nervous/anxious.    All other ROS negative except what is listed above and in the HPI.      Objective:    BP 135/71 (BP Location: Left Arm, Cuff Size: Normal)   Pulse 84   Temp 98 F (36.7 C) (Oral)   Ht 5\' 4"  (1.626 m)  Wt 209 lb 12.8 oz (95.2 kg)   LMP  (LMP Unknown)   SpO2 98%   BMI 36.01 kg/m   Wt Readings from Last 3 Encounters:  02/13/24 209 lb 12.8 oz (95.2 kg)  11/28/23 205 lb (93 kg)  10/30/23 204 lb 9.6 oz (92.8 kg)    Physical Exam Vitals and nursing note reviewed.  Constitutional:      General: She is not in acute distress.    Appearance: Normal appearance. She is well-developed and well-groomed. She is obese. She is not ill-appearing.  HENT:     Head: Normocephalic.     Right Ear: Tympanic membrane, ear canal and external ear normal. There is no impacted cerumen.     Left Ear: Tympanic membrane, ear canal and external ear normal. There is no impacted cerumen.     Nose: Nose normal. No congestion.     Mouth/Throat:     Mouth: Mucous membranes are moist.     Pharynx: Oropharynx is clear. No posterior oropharyngeal erythema.  Eyes:     General: Lids are normal.        Right eye: No discharge.        Left eye: No discharge.     Extraocular Movements: Extraocular movements intact.     Conjunctiva/sclera: Conjunctivae normal.     Pupils: Pupils are equal, round, and reactive to light.     Funduscopic  exam:    Right eye: Red reflex present.        Left eye: Red reflex present. Neck:     Thyroid: No thyroid mass or thyromegaly.     Vascular: No carotid bruit.  Cardiovascular:     Rate and Rhythm: Normal rate and regular rhythm.     Heart sounds: Normal heart sounds. No murmur heard. Pulmonary:     Effort: Pulmonary effort is normal. No respiratory distress.     Breath sounds: Normal breath sounds. No wheezing.  Abdominal:     General: Bowel sounds are normal.     Palpations: Abdomen is soft.  Musculoskeletal:        General: Normal range of motion.     Cervical back: Normal range of motion and neck supple. No edema or tenderness.     Right lower leg: No edema.     Left lower leg: No edema.  Lymphadenopathy:     Head:     Right side of head: No tonsillar adenopathy.     Left side of head: No tonsillar adenopathy.     Cervical:     Right cervical: No superficial cervical adenopathy.    Left cervical: No superficial cervical adenopathy.  Skin:    General: Skin is warm and dry.     Findings: No bruising or erythema.  Neurological:     General: No focal deficit present.     Mental Status: She is alert and oriented to person, place, and time. Mental status is at baseline.     Deep Tendon Reflexes: Reflexes are normal and symmetric.  Psychiatric:        Attention and Perception: Attention and perception normal.        Mood and Affect: Mood and affect normal.        Speech: Speech normal.        Behavior: Behavior normal. Behavior is cooperative.        Thought Content: Thought content normal.        Cognition and Memory: Cognition normal.  Judgment: Judgment normal.    Results for orders placed or performed in visit on 02/13/24  HM DIABETES EYE EXAM   Collection Time: 02/12/24 11:06 AM  Result Value Ref Range   HM Diabetic Eye Exam No Retinopathy No Retinopathy      Assessment & Plan:   Problem List Items Addressed This Visit       Cardiovascular and  Mediastinum   Hypertension associated with diabetes (HCC)   Chronic, stable. Blood pressure within normal range at office today. Recommend checking several times weekly at home and documenting. Continue current medication regimen of Telmisartan 80 mg daily. Labs today. Continue to focus on DASH diet at home. Follow up in 3 months.      Relevant Medications   Semaglutide, 2 MG/DOSE, 8 MG/3ML SOPN   Other Relevant Orders   Bayer DCA Hb A1c Waived   Microalbumin, Urine Waived   CBC with Differential/Platelet   Comprehensive metabolic panel     Respiratory   Sleep apnea   Reports no symptoms. Not using CPAP. Follow up in 3 months.        Digestive   GERD without esophagitis   Chronic, ongoing. Continue current medication regimen and adjust adjust as needed. Magnesium level check today. Follow up in 3 months.      Relevant Orders   Magnesium     Endocrine   Type 2 diabetes mellitus with diabetic nephropathy (HCC) - Primary   Chronic, ongoing. A1c 5.5 today. Praised for continued success with A1c. Labs today. Continue Telmisartan for kidney protection. Continue Ozempic 2 mg weekly as it is offering good benefit to diabetes and weight management. Reports doing much better since no longer taking Jardiance. Continue to focus on diet and exercise. Recommend checking blood sugar daily and documenting. Follow up in 3 months.      Relevant Medications   Semaglutide, 2 MG/DOSE, 8 MG/3ML SOPN   Other Relevant Orders   Bayer DCA Hb A1c Waived   Microalbumin, Urine Waived   CKD stage 3 due to type 2 diabetes mellitus (HCC)   Chronic, ongoing CKD stage 3 with stable levels. Continue Telmisartan for kidney protection. Labs today. Continue collaboration with nephrology. Follow up in 3 months.      Relevant Medications   Semaglutide, 2 MG/DOSE, 8 MG/3ML SOPN   Other Relevant Orders   CBC with Differential/Platelet   Comprehensive metabolic panel   Hyperlipidemia associated with type 2  diabetes mellitus (HCC)   Chronic, ongoing. Lipid panel today. Continue current medication regimen. Follow up in 3 months.      Relevant Medications   Semaglutide, 2 MG/DOSE, 8 MG/3ML SOPN   Other Relevant Orders   Bayer DCA Hb A1c Waived   Comprehensive metabolic panel   Lipid Panel w/o Chol/HDL Ratio   Multinodular thyroid   Continue to monitor TSH annually. Labs checked today.      Relevant Orders   TSH   Diabetes mellitus treated with injections of non-insulin medication (HCC)   Having good results with Ozempic 2 mg weekly. Continue current plan. A1c 5.5% today. Verbalized understanding to notify provider if blood glucose <70 on a consistent basis. Follow up in 3 months.      Relevant Medications   Semaglutide, 2 MG/DOSE, 8 MG/3ML SOPN   Other Relevant Orders   Bayer DCA Hb A1c Waived     Musculoskeletal and Integument   Osteopenia of neck of left femur   Ongoing. Continue supplements daily. Vit D level checked today. No  recent falls. Follow up in 3 months.      Relevant Orders   VITAMIN D 25 Hydroxy (Vit-D Deficiency, Fractures)   Seropositive rheumatoid arthritis (HCC)   Ongoing and stable. Continue collaboration with rheumatology and Plaquenil as ordered. She continues to notice improvement with symptoms. Follow up in 3 months.      Relevant Orders   CBC with Differential/Platelet     Other   Elevated uric acid in blood   Labs checked today. No current treatment. No current symptoms.       Relevant Orders   Uric acid   Obesity   BMI 36.01 with T2DM, HTN/HLD. Recommend eating smaller high protein, low fat meals more frequently and continued exercise 30 min a day 5 times a week. Recommended smaller portions of Ritz crackers as night time snack. Patient verbalized understanding and motivation to adhere to these recommendations. Follow up in 3 months.      Relevant Medications   Semaglutide, 2 MG/DOSE, 8 MG/3ML SOPN   Other Visit Diagnoses       Colon cancer  screening       GI referral placed.   Relevant Orders   Ambulatory referral to Gastroenterology     Encounter for annual physical exam       Annual physical today with labs and health maintenance reviewed, discussed with patient.        Follow up plan: Return in about 3 months (around 05/15/2024) for T2DM, HTN/HLD, CKD.   LABORATORY TESTING:  - Pap smear: not applicable  IMMUNIZATIONS:   - Tdap: Tetanus vaccination status reviewed: last tetanus booster within 10 years. - Influenza: Refused - Pneumovax: Up to date - Prevnar: Up to date - COVID: Up to date - HPV: Not applicable - Shingrix vaccine: Up to date  SCREENING: -Mammogram: Up to date due April 26th, 2025 - Colonoscopy: Ordered today  - Bone Density: Up to date  -Hearing Test: Not applicable  -Spirometry: Not applicable   PATIENT COUNSELING:   Advised to take 1 mg of folate supplement per day if capable of pregnancy.   Sexuality: Discussed sexually transmitted diseases, partner selection, use of condoms, avoidance of unintended pregnancy  and contraceptive alternatives.   Advised to avoid cigarette smoking.  I discussed with the patient that most people either abstain from alcohol or drink within safe limits (<=14/week and <=4 drinks/occasion for males, <=7/weeks and <= 3 drinks/occasion for females) and that the risk for alcohol disorders and other health effects rises proportionally with the number of drinks per week and how often a drinker exceeds daily limits.  Discussed cessation/primary prevention of drug use and availability of treatment for abuse.   Diet: Encouraged to adjust caloric intake to maintain  or achieve ideal body weight, to reduce intake of dietary saturated fat and total fat, to limit sodium intake by avoiding high sodium foods and not adding table salt, and to maintain adequate dietary potassium and calcium preferably from fresh fruits, vegetables, and low-fat dairy products.    Stressed the  importance of regular exercise  Injury prevention: Discussed safety belts, safety helmets, smoke detector, smoking near bedding or upholstery.   Dental health: Discussed importance of regular tooth brushing, flossing, and dental visits.    NEXT PREVENTATIVE PHYSICAL DUE IN 1 YEAR. Return in about 3 months (around 05/15/2024) for T2DM, HTN/HLD, CKD.

## 2024-02-13 NOTE — Assessment & Plan Note (Signed)
 Labs checked today. No current treatment. No current symptoms.

## 2024-02-13 NOTE — Telephone Encounter (Signed)
 Requested Prescriptions  Pending Prescriptions Disp Refills   OZEMPIC, 2 MG/DOSE, 8 MG/3ML SOPN [Pharmacy Med Name: OZEMPIC  2MG  SOLUTION PEN-INJECTOR  PEN DOSE] 9 mL 3    Sig: INJECT SUBCUTANEOUSLY 2 MG EVERY WEEK AS DIRECTED     Endocrinology:  Diabetes - GLP-1 Receptor Agonists - semaglutide Failed - 02/13/2024  7:46 AM      Failed - Cr in normal range and within 360 days    Creat  Date Value Ref Range Status  04/19/2023 1.04 0.50 - 1.05 mg/dL Final   Creatinine, Ser  Date Value Ref Range Status  10/30/2023 1.10 (H) 0.57 - 1.00 mg/dL Final         Passed - HBA1C in normal range and within 180 days    Hemoglobin A1C  Date Value Ref Range Status  03/17/2016 7.2  Final   HB A1C (BAYER DCA - WAIVED)  Date Value Ref Range Status  10/30/2023 5.3 4.8 - 5.6 % Final    Comment:             Prediabetes: 5.7 - 6.4          Diabetes: >6.4          Glycemic control for adults with diabetes: <7.0          Passed - Valid encounter within last 6 months    Recent Outpatient Visits           2 months ago Hypertension associated with diabetes (HCC)   [place] [facility] [place], Jolene T, NP   3 months ago Type 2 diabetes mellitus with diabetic nephropathy, without long-term current use of insulin (HCC)   [place] [facility] [place], [place] T, NP   6 months ago Type 2 diabetes mellitus with diabetic nephropathy, without long-term current use of insulin (HCC)   [place] [facility] [place], [place] T, NP   7 months ago Congestion of paranasal sinus   [place] [facility] [place], Sherran Needs, NP   9 months ago Type 2 diabetes mellitus with diabetic nephropathy, without long-term current use of insulin (HCC)   [place] [facility] [place], Dorie Rank, NP       Future Appointments             Today Marjie Skiff, NP [place] [facility]   In 2 months Rice, Jamesetta [place], MD  [facility] - A Dept Of [place]. [facility]

## 2024-02-13 NOTE — Progress Notes (Deleted)
 BP (!) 146/71   Pulse 90   Temp 98 F (36.7 C) (Oral)   Ht 5\' 4"  (1.626 m)   Wt 209 lb 12.8 oz (95.2 kg)   LMP  (LMP Unknown)   SpO2 98%   BMI 36.01 kg/m    Subjective:    Patient ID: Kim Thomas, female    DOB: 21-Aug-1956, 68 y.o.   MRN: [id number]  HPI: Kim Thomas is a 68 y.o. female presenting on 02/13/2024 for comprehensive medical examination. Current medical complaints include:none  She currently lives with: Menopausal Symptoms: no  DIABETES A1c 5.3% in December.  Takes Ozempic 2 MG weekly.  Jardiance stopped in 2022 due to stable A1c.  Takes Gabapentin 400 MG TID for neuropathy.   Hypoglycemic episodes:{Blank single:19197::"yes","no"} Polydipsia/polyuria: {Blank single:19197::"yes","no"} Visual disturbance: {Blank single:19197::"yes","no"} Chest pain: {Blank single:19197::"yes","no"} Paresthesias: {Blank single:19197::"yes","no"} Glucose Monitoring: {Blank single:19197::"yes","no"}  Accucheck frequency: {Blank single:19197::"Not Checking","Daily","BID","TID"}  Fasting glucose:  Post prandial:  Evening:  Before meals: Taking Insulin?: {Blank single:19197::"yes","no"}  Long acting insulin:  Short acting insulin: Blood Pressure Monitoring: {Blank single:19197::"not checking","rarely","daily","weekly","monthly","a few times a day","a few times a week","a few times a month"} Retinal Examination: {Blank single:19197::"Up to Date","Not up to Date"} Foot Exam: {Blank single:19197::"Up to Date","Not up to Date"} Diabetic Education: {Blank single:19197::"Completed","Not Completed"} Pneumovax: {Blank single:19197::"Up to Date","Not up to Date","unknown"} Influenza: {Blank single:19197::"Up to Date","Not up to Date","unknown"} Aspirin: {Blank single:19197::"yes","no"}   HYPERTENSION / HYPERLIPIDEMIA Taking Atorvastatin 20 MG daily and Telmisartan 80 MG.   Satisfied with current treatment? {Blank single:19197::"yes","no"} Duration of hypertension: {Blank  single:19197::"chronic","months","years"} BP monitoring frequency: {Blank single:19197::"not checking","rarely","daily","weekly","monthly","a few times a day","a few times a week","a few times a month"} BP range:  BP medication side effects: {Blank single:19197::"yes","no"} Duration of hyperlipidemia: {Blank single:19197::"chronic","months","years"} Cholesterol medication side effects: {Blank single:19197::"yes","no"} Cholesterol supplements: {Blank multiple:19196::"none","fish oil","niacin","red yeast rice"} Medication compliance: {Blank single:19197::"excellent compliance","good compliance","fair compliance","poor compliance"} Aspirin: {Blank single:19197::"yes","no"} Recent stressors: {Blank single:19197::"yes","no"} Recurrent headaches: {Blank single:19197::"yes","no"} Visual changes: {Blank single:19197::"yes","no"} Palpitations: {Blank single:19197::"yes","no"} Dyspnea: {Blank single:19197::"yes","no"} Chest pain: {Blank single:19197::"yes","no"} Lower extremity edema: {Blank single:19197::"yes","no"} Dizzy/lightheaded: {Blank single:19197::"yes","no"}  The 10-year ASCVD risk score (Arnett DK, et al., 2019) is: 23.6%   Values used to calculate the score:     Age: 5 years     Sex: Female     Is Non-Hispanic African American: Yes     Diabetic: Yes     Tobacco smoker: No     Systolic Blood Pressure: 146 mmHg     Is BP treated: Yes     HDL Cholesterol: 83 mg/dL     Total Cholesterol: 148 mg/dL  CHRONIC KIDNEY DISEASE Follows with nephrology, Dr. Thedore Mins, last on 08/21/23 - no changes made. History of elevation in uric acid levels, no current medications.  Sees rheumatology for seropositive RA, last seen 10/19/23.  Takes Plaquenil. CKD status: {Blank single:19197::"controlled","uncontrolled","better","worse","exacerbated","stable"} Medications renally dose: {Blank single:19197::"yes","no"} Previous renal evaluation: {Blank single:19197::"yes","no"} Pneumovax:  {Blank  single:19197::"Up to Date","Not up to Date","unknown"} Influenza Vaccine:  {Blank single:19197::"Up to Date","Not up to Date","unknown"}   GERD Continues on Omeprazole. GERD control status: {Blank single:19197::"controlled","uncontrolled","better","worse","exacerbated","stable"} Satisfied with current treatment? {Blank single:19197::"yes","no"} Heartburn frequency:  Medication side effects: {Blank single:19197::"yes","no"}  Medication compliance: {Blank multiple:19196::"better","worse","stable","fluctuating"} Dysphagia: {Blank single:19197::"yes","no"} Odynophagia:  {Blank single:19197::"yes","no"} Hematemesis: {Blank single:19197::"yes","no"} Blood in stool: {Blank single:19197::"yes","no"} EGD: {Blank single:19197::"yes","no"}       07/28/2023   10:27 AM 08/22/2023   11:36 AM 10/30/2023   10:54 AM 11/28/2023   10:43 AM 02/13/2024    1:07 PM  Fall Risk  Falls in  the past year? 0 0 0 0 0  Was there an injury with Fall? 0 0 0 0 0  Fall Risk Category Calculator 0 0 0 0 0  Patient at Risk for Falls Due to No Fall Risks No Fall Risks No Fall Risks No Fall Risks No Fall Risks  Fall risk Follow up Falls evaluation completed Falls prevention discussed Falls evaluation completed Falls evaluation completed Falls evaluation completed     Depression Screen done today and results listed below:     02/13/2024    1:07 PM 11/28/2023   10:43 AM 10/30/2023   10:55 AM 08/22/2023   11:31 AM 07/28/2023   10:27 AM  Depression screen PHQ 2/9  Decreased Interest 0 0 0 0 0  Down, Depressed, Hopeless 0 0 0 0 0  PHQ - 2 Score 0 0 0 0 0  Altered sleeping 0 0 0 0 0  Tired, decreased energy 0 0 0 0 0  Change in appetite 0 0 0 0 0  Feeling bad or failure about yourself  0 0 0 0 0  Trouble concentrating 0 0 0 0 0  Moving slowly or fidgety/restless 0 0 0 0 0  Suicidal thoughts 0 0 0 0 0  PHQ-9 Score 0 0 0 0 0  Difficult doing work/chores Not difficult at all Not difficult at all Not difficult at all Not  difficult at all Not difficult at all      02/13/2024    1:07 PM 11/28/2023   10:43 AM 10/30/2023   10:56 AM 07/28/2023   10:28 AM  GAD 7 : Generalized Anxiety Score  Nervous, Anxious, on Edge 0 0 0 0  Control/stop worrying 0 0 0 0  Worry too much - different things 0 0 0 0  Trouble relaxing 0 0 0 0  Restless 0 0 0 0  Easily annoyed or irritable 0 0 0 0  Afraid - awful might happen 0 0 0 0  Total GAD 7 Score 0 0 0 0  Anxiety Difficulty Not difficult at all Not difficult at all Not difficult at all Not difficult at all    Functional Status Survey: Is the patient deaf or have difficulty hearing?: No Does the patient have difficulty seeing, even when wearing glasses/contacts?: No Does the patient have difficulty concentrating, remembering, or making decisions?: No Does the patient have difficulty walking or climbing stairs?: No Does the patient have difficulty dressing or bathing?: No Does the patient have difficulty doing errands alone such as visiting a doctor's office or shopping?: No   Past Medical History:  Past Medical History:  Diagnosis Date   Allergy    Anxiety    Chronic kidney disease    DDD (degenerative disc disease), cervical    Diabetes mellitus without complication (HCC)    GERD (gastroesophageal reflux disease)    Gout    Hypertension    Oxygen deficiency    Seropositive rheumatoid arthritis (HCC)    Sleep apnea     Surgical History:  Past Surgical History:  Procedure Laterality Date   BREAST BIOPSY Left    benign   BREAST CYST EXCISION Left    c section     x2   CESAREAN SECTION     CHOLECYSTECTOMY     EYE SURGERY Bilateral 2019   catracts Dr.Hecter in [place]    FOOT SURGERY Bilateral    KNEE ARTHROSCOPY Left 07/18/2016   Procedure: ARTHROSCOPY KNEE;  Surgeon: Donato Heinz, MD;  Location: [facility]  ORS;  Service: Orthopedics;  Laterality: Left;   KNEE ARTHROSCOPY WITH LATERAL MENISECTOMY Left 07/18/2016   Procedure: KNEE ARTHROSCOPY WITH PARTIAL   LATERAL MENISECTOMY, CHONDROPLASTY LATERAL FEMORAL CONDYLE;  Surgeon: Donato Heinz, MD;  Location: [facility] ORS;  Service: Orthopedics;  Laterality: Left;   SPINE SURGERY  11/09/2011   C5   TUBAL LIGATION      Medications:  Current Outpatient Medications on File Prior to Visit  Medication Sig   acetaminophen (TYLENOL) 500 MG tablet Take 500 mg by mouth daily as needed.   ascorbic acid (VITAMIN C) 1000 MG tablet Take by mouth.   Aspirin (VAZALORE) 81 MG CAPS Take by mouth.   atorvastatin (LIPITOR) 20 MG tablet TAKE 1 TABLET BY MOUTH ONCE  DAILY   Blood Glucose Monitoring Suppl (ONE TOUCH ULTRA 2) w/Device KIT USE TO CHECK BLOOD SUGAR 3  TO 4 TIMES DAILY AND  DOCUMENT FOR VISITS   CALCIUM CITRATE PO Take 1,000 mg by mouth daily.   Cholecalciferol (VITAMIN D3) 2000 units TABS Take 2,000 Units by mouth daily.   Co-Enzyme Q-10 100 MG CAPS Take 50 mg by mouth daily.   EQ ALLERGY RELIEF, CETIRIZINE, 10 MG tablet Take 1 tablet by mouth once daily   fluticasone (FLONASE) 50 MCG/ACT nasal spray USE 1 TO 2 SPRAYS IN BOTH  NOSTRILS TWICE DAILY   gabapentin (NEURONTIN) 400 MG capsule TAKE 1 CAPSULE BY MOUTH 3 TIMES  DAILY   glucose blood (ONETOUCH ULTRA) test strip USE TO CHECK BLOOD SUGAR DAILY   hydroxychloroquine (PLAQUENIL) 200 MG tablet TAKE 1 TABLET BY MOUTH DAILY   Lancets (ONETOUCH DELICA PLUS LANCET30G) MISC 1 each by Does not apply route 2 (two) times daily.   Magnesium 400 MG TABS Take by mouth.   Omega-3 1000 MG CAPS Take 1 capsule by mouth daily.   omeprazole (PRILOSEC) 20 MG capsule TAKE 1 CAPSULE BY MOUTH DAILY   Potassium 99 MG TABS Take by mouth.   Semaglutide, 2 MG/DOSE, 8 MG/3ML SOPN Inject 2 mg as directed once a week.   telmisartan (MICARDIS) 80 MG tablet Take 1 tablet (80 mg total) by mouth daily.   triamcinolone cream (KENALOG) 0.1 % Apply 1 Application topically 2 (two) times daily.   vitamin E 100 UNIT capsule Take 100 Units by mouth daily.   No current  facility-administered medications on file prior to visit.    Allergies:  Allergies  Allergen Reactions   Morphine And Codeine Itching    It burned, and made patient itch and go out of her mind.   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  Tobacco Use   Smoking status: Former    Current packs/day: 0.00    Average packs/day: 0.2 packs/day for 39.5 years (9.9 ttl pk-yrs)    Types: Cigarettes    Start date: 67    Quit date: 05/21/2011    Years since quitting: 12.7    Passive exposure: Never   Smokeless tobacco: Never  Vaping Use   Vaping status: Never Used  Substance and Sexual Activity  Alcohol use: No   Drug use: No   Sexual activity: Not Currently  Other Topics Concern   Not on file  Social History Narrative   Never been married, 2 children   Social Drivers of Corporate investment banker Strain: Low Risk  (11/24/2023)   Overall Financial Resource Strain (CARDIA)    Difficulty of Paying Living Expenses: Not hard at all  Food Insecurity: No Food Insecurity (11/24/2023)   Hunger Vital Sign    Worried About Running Out of Food in the Last Year: Never true    Ran Out of Food in the Last Year: Never true  Transportation Needs: No Transportation Needs (11/24/2023)   PRAPARE - Administrator, Civil Service (Medical): No    Lack of Transportation (Non-Medical): No  Physical Activity: Insufficiently Active (11/24/2023)   Exercise Vital Sign    Days of Exercise per Week: 2 days    Minutes of Exercise per Session: 30 min  Stress: No Stress Concern Present  (11/24/2023)   [employer] of Occupational Health - Occupational Stress Questionnaire    Feeling of Stress : Not at all  Social Connections: Moderately Integrated (11/24/2023)   Social Connection and Isolation Panel [NHANES]    Frequency of Communication with Friends and Family: More than three times a week    Frequency of Social Gatherings with Friends and Family: Once a week    Attends Religious Services: More than 4 times per year    Active Member of [employer] or Organizations: Yes    Attends Engineer, structural: More than 4 times per year    Marital Status: Never married  Intimate Partner Violence: Not At Risk (08/22/2023)   Humiliation, Afraid, Rape, and Kick questionnaire    Fear of Current or Ex-Partner: No    Emotionally Abused: No    Physically Abused: No    Sexually Abused: No   Social History   Tobacco Use  Smoking Status Former   Current packs/day: 0.00   Average packs/day: 0.2 packs/day for 39.5 years (9.9 ttl pk-yrs)   Types: Cigarettes   Start date: 68   Quit date: 05/21/2011   Years since quitting: 12.7   Passive exposure: Never  Smokeless Tobacco Never   Social History   Substance and Sexual Activity  Alcohol Use No    Family History:  Family History  Problem Relation Age of Onset   Cancer Mother        breast   Hypertension Mother    Hyperlipidemia Mother    Diabetes Mother    Glaucoma Mother    Cataracts Mother    Breast cancer Mother 2   Arthritis Mother    Cancer Father    Heart disease Sister    Glaucoma Maternal Aunt    Breast cancer Maternal Aunt    Cancer Maternal Grandfather    Breast cancer Cousin    Alcohol abuse Neg Hx     Past medical history, surgical history, medications, allergies, family history and social history reviewed with patient today and changes made to appropriate areas of the chart.   ROS All other ROS negative except what is listed above and in the HPI.      Objective:    BP (!) 146/71   Pulse  90   Temp 98 F (36.7 C) (Oral)   Ht 5\' 4"  (1.626 m)   Wt 209 lb 12.8 oz (95.2 kg)   LMP  (LMP Unknown)   SpO2 98%  BMI 36.01 kg/m   Wt Readings from Last 3 Encounters:  02/13/24 209 lb 12.8 oz (95.2 kg)  11/28/23 205 lb (93 kg)  10/30/23 204 lb 9.6 oz (92.8 kg)    Physical Exam  Results for orders placed or performed in visit on 02/13/24  HM DIABETES EYE EXAM   Collection Time: 02/12/24 11:06 AM  Result Value Ref Range   HM Diabetic Eye Exam No Retinopathy No Retinopathy      Assessment & Plan:   Problem List Items Addressed This Visit       Cardiovascular and Mediastinum   Hypertension associated with diabetes (HCC)   Relevant Orders   Bayer DCA Hb A1c Waived   Microalbumin, Urine Waived   CBC with Differential/Platelet   Comprehensive metabolic panel     Respiratory   Sleep apnea     Digestive   GERD without esophagitis   Relevant Orders   Magnesium     Endocrine   CKD stage 3 due to type 2 diabetes mellitus (HCC)   Relevant Orders   CBC with Differential/Platelet   Comprehensive metabolic panel   Diabetes mellitus treated with injections of non-insulin medication (HCC)   Relevant Orders   Bayer DCA Hb A1c Waived   Hyperlipidemia associated with type 2 diabetes mellitus (HCC)   Relevant Orders   Bayer DCA Hb A1c Waived   Comprehensive metabolic panel   Lipid Panel w/o Chol/HDL Ratio   Multinodular thyroid   Relevant Orders   TSH   Type 2 diabetes mellitus with diabetic nephropathy (HCC) - Primary   Relevant Orders   Bayer DCA Hb A1c Waived   Microalbumin, Urine Waived     Musculoskeletal and Integument   Osteopenia of neck of left femur   Relevant Orders   VITAMIN D 25 Hydroxy (Vit-D Deficiency, Fractures)   Seropositive rheumatoid arthritis (HCC)   Relevant Orders   CBC with Differential/Platelet     Other   Elevated uric acid in blood   Relevant Orders   Uric acid   Obesity   Other Visit Diagnoses       Colon cancer screening        GI referral placed.   Relevant Orders   Ambulatory referral to Gastroenterology     Encounter for annual physical exam       Annual physical today with labs and health maintenance reviewed, discussed with patient.        Follow up plan: No follow-ups on file.   LABORATORY TESTING:  - Pap smear: not applicable  IMMUNIZATIONS:   - Tdap: Tetanus vaccination status reviewed: last tetanus booster within 10 years. - Influenza: Refused - Pneumovax: Up to date - Prevnar: Up to date - COVID: Up to date - HPV: Not applicable - Shingrix vaccine: Up to date  SCREENING: -Mammogram: Up to date due April 26th, 2025 - Colonoscopy: Ordered today  - Bone Density: Up to date  -Hearing Test: Not applicable  -Spirometry: Not applicable   PATIENT COUNSELING:   Advised to take 1 mg of folate supplement per day if capable of pregnancy.   Sexuality: Discussed sexually transmitted diseases, partner selection, use of condoms, avoidance of unintended pregnancy  and contraceptive alternatives.   Advised to avoid cigarette smoking.  I discussed with the patient that most people either abstain from alcohol or drink within safe limits (<=14/week and <=4 drinks/occasion for males, <=7/weeks and <= 3 drinks/occasion for females) and that the risk for alcohol disorders and other health effects rises  proportionally with the number of drinks per week and how often a drinker exceeds daily limits.  Discussed cessation/primary prevention of drug use and availability of treatment for abuse.   Diet: Encouraged to adjust caloric intake to maintain  or achieve ideal body weight, to reduce intake of dietary saturated fat and total fat, to limit sodium intake by avoiding high sodium foods and not adding table salt, and to maintain adequate dietary potassium and calcium preferably from fresh fruits, vegetables, and low-fat dairy products.    Stressed the importance of regular exercise  Injury prevention:  Discussed safety belts, safety helmets, smoke detector, smoking near bedding or upholstery.   Dental health: Discussed importance of regular tooth brushing, flossing, and dental visits.    NEXT PREVENTATIVE PHYSICAL DUE IN 1 YEAR. No follow-ups on file.

## 2024-02-13 NOTE — Assessment & Plan Note (Signed)
 Continue to monitor TSH annually. Labs checked today.

## 2024-02-13 NOTE — Assessment & Plan Note (Signed)
 Ongoing. Continue supplements daily. Vit D level checked today. No recent falls. Follow up in 3 months.

## 2024-02-13 NOTE — Assessment & Plan Note (Signed)
 Chronic, ongoing. Lipid panel today. Continue current medication regimen. Follow up in 3 months.

## 2024-02-13 NOTE — Assessment & Plan Note (Signed)
 Chronic, ongoing. Continue current medication regimen and adjust adjust as needed. Magnesium level check today. Follow up in 3 months.

## 2024-02-13 NOTE — Assessment & Plan Note (Signed)
 Chronic, ongoing. A1c 5.5 today. Praised for continued success with A1c. Labs today. Continue Telmisartan for kidney protection. Continue Ozempic 2 mg weekly as it is offering good benefit to diabetes and weight management. Reports doing much better since no longer taking Jardiance. Continue to focus on diet and exercise. Recommend checking blood sugar daily and documenting. Follow up in 3 months.

## 2024-02-14 ENCOUNTER — Encounter: Payer: Self-pay | Admitting: Nurse Practitioner

## 2024-02-14 LAB — CBC WITH DIFFERENTIAL/PLATELET
Basophils Absolute: 0.1 10*3/uL (ref 0.0–0.2)
Basos: 2 %
EOS (ABSOLUTE): 0.1 10*3/uL (ref 0.0–0.4)
Eos: 2 %
Hematocrit: 34.9 % (ref 34.0–46.6)
Hemoglobin: 11.4 g/dL (ref 11.1–15.9)
Immature Grans (Abs): 0 10*3/uL (ref 0.0–0.1)
Immature Granulocytes: 0 %
Lymphocytes Absolute: 1.4 10*3/uL (ref 0.7–3.1)
Lymphs: 38 %
MCH: 30.3 pg (ref 26.6–33.0)
MCHC: 32.7 g/dL (ref 31.5–35.7)
MCV: 93 fL (ref 79–97)
Monocytes Absolute: 0.4 10*3/uL (ref 0.1–0.9)
Monocytes: 11 %
Neutrophils Absolute: 1.7 10*3/uL (ref 1.4–7.0)
Neutrophils: 47 %
Platelets: 228 10*3/uL (ref 150–450)
RBC: 3.76 x10E6/uL — ABNORMAL LOW (ref 3.77–5.28)
RDW: 13.1 % (ref 11.7–15.4)
WBC: 3.6 10*3/uL (ref 3.4–10.8)

## 2024-02-14 LAB — COMPREHENSIVE METABOLIC PANEL
ALT: 16 IU/L (ref 0–32)
AST: 25 IU/L (ref 0–40)
Albumin: 4 g/dL (ref 3.9–4.9)
Alkaline Phosphatase: 67 IU/L (ref 44–121)
BUN/Creatinine Ratio: 11 — ABNORMAL LOW (ref 12–28)
BUN: 13 mg/dL (ref 8–27)
Bilirubin Total: 0.2 mg/dL (ref 0.0–1.2)
CO2: 24 mmol/L (ref 20–29)
Calcium: 8.9 mg/dL (ref 8.7–10.3)
Chloride: 108 mmol/L — ABNORMAL HIGH (ref 96–106)
Creatinine, Ser: 1.18 mg/dL — ABNORMAL HIGH (ref 0.57–1.00)
Globulin, Total: 2.3 g/dL (ref 1.5–4.5)
Glucose: 93 mg/dL (ref 70–99)
Potassium: 3.8 mmol/L (ref 3.5–5.2)
Sodium: 145 mmol/L — ABNORMAL HIGH (ref 134–144)
Total Protein: 6.3 g/dL (ref 6.0–8.5)
eGFR: 50 mL/min/{1.73_m2} — ABNORMAL LOW (ref >59)

## 2024-02-14 LAB — LIPID PANEL W/O CHOL/HDL RATIO
Cholesterol, Total: 142 mg/dL (ref 100–199)
HDL: 77 mg/dL (ref >39)
LDL Chol Calc (NIH): 52 mg/dL (ref 0–99)
Triglycerides: 66 mg/dL (ref 0–149)
VLDL Cholesterol Cal: 13 mg/dL (ref 5–40)

## 2024-02-14 LAB — TSH: TSH: 0.831 u[IU]/mL (ref 0.450–4.500)

## 2024-02-14 LAB — URIC ACID: Uric Acid: 5.8 mg/dL (ref 3.0–7.2)

## 2024-02-14 LAB — VITAMIN D 25 HYDROXY (VIT D DEFICIENCY, FRACTURES): Vit D, 25-Hydroxy: 48.3 ng/mL (ref 30.0–100.0)

## 2024-02-14 LAB — MAGNESIUM: Magnesium: 1.6 mg/dL (ref 1.6–2.3)

## 2024-02-14 NOTE — Progress Notes (Signed)
 Contacted via MyChart   Good afternoon Kim Thomas, your labs have returned and are overall at baseline: - Kidney function, eGFR and creatinine, continues to show Stage 3a kidney disease but no worsening.  Liver function, AST and ALT, is normal. - Sodium, salt, level at little elevated.  Focus on less salt and more water in diet. - Remainder of labs stable, no medication changes needed.  Any questions? Keep being stellar!!  Thank you for allowing me to participate in your care.  I appreciate you. Kindest regards, Jashay Roddy

## 2024-02-19 ENCOUNTER — Other Ambulatory Visit: Payer: Self-pay

## 2024-02-19 ENCOUNTER — Telehealth: Payer: Self-pay

## 2024-02-19 DIAGNOSIS — Z1211 Encounter for screening for malignant neoplasm of colon: Secondary | ICD-10-CM

## 2024-02-19 MED ORDER — NA SULFATE-K SULFATE-MG SULF 17.5-3.13-1.6 GM/177ML PO SOLN: 1.0000 | Freq: Once | ORAL | 0 refills | Status: AC

## 2024-02-19 NOTE — Telephone Encounter (Signed)
 Gastroenterology Pre-Procedure Review  Request Date: 02/27/24 Requesting Physician: Dr. Servando Snare  PATIENT REVIEW QUESTIONS: The patient responded to the following health history questions as indicated:    1. Are you having any GI issues? no 2. Do you have a personal history of Polyps? no 3. Do you have a family history of Colon Cancer or Polyps? no 4. Diabetes Mellitus? Yes takes semaglutide injections and has been advised to stop 7 days prior to colonoscopy and if she has any concerns monitoring/managing blood glucose to address with PCP  5. Joint replacements in the past 12 months?no 6. Major health problems in the past 3 months?no 7. Any artificial heart valves, MVP, or defibrillator?no    MEDICATIONS & ALLERGIES:    Patient reports the following regarding taking any anticoagulation/antiplatelet therapy:   Plavix, Coumadin, Eliquis, Xarelto, Lovenox, Pradaxa, Brilinta, or Effient? no Aspirin? no  Patient confirms/reports the following medications:  Current Outpatient Medications  Medication Sig Dispense Refill   acetaminophen (TYLENOL) 500 MG tablet Take 500 mg by mouth daily as needed.     ascorbic acid (VITAMIN C) 1000 MG tablet Take by mouth.     Aspirin (VAZALORE) 81 MG CAPS Take by mouth.     atorvastatin (LIPITOR) 20 MG tablet TAKE 1 TABLET BY MOUTH ONCE  DAILY 100 tablet 2   Blood Glucose Monitoring Suppl (ONE TOUCH ULTRA 2) w/Device KIT USE TO CHECK BLOOD SUGAR 3  TO 4 TIMES DAILY AND  DOCUMENT FOR VISITS 1 kit 4   CALCIUM CITRATE PO Take 1,000 mg by mouth daily.     Cholecalciferol (VITAMIN D3) 2000 units TABS Take 2,000 Units by mouth daily.     Co-Enzyme Q-10 100 MG CAPS Take 50 mg by mouth daily.     EQ ALLERGY RELIEF, CETIRIZINE, 10 MG tablet Take 1 tablet by mouth once daily 90 tablet 0   fluticasone (FLONASE) 50 MCG/ACT nasal spray USE 1 TO 2 SPRAYS IN BOTH  NOSTRILS TWICE DAILY 48 g 0   gabapentin (NEURONTIN) 400 MG capsule TAKE 1 CAPSULE BY MOUTH 3 TIMES  DAILY 300  capsule 2   glucose blood (ONETOUCH ULTRA) test strip USE TO CHECK BLOOD SUGAR DAILY 100 strip 0   hydroxychloroquine (PLAQUENIL) 200 MG tablet TAKE 1 TABLET BY MOUTH DAILY 80 tablet 3   Lancets (ONETOUCH DELICA PLUS LANCET30G) MISC 1 each by Does not apply route 2 (two) times daily. 100 each 12   Magnesium 400 MG TABS Take by mouth.     Na Sulfate-K Sulfate-Mg Sulfate concentrate (SUPREP) 17.5-3.13-1.6 GM/177ML SOLN Take 1 kit (354 mLs total) by mouth once for 1 dose. 354 mL 0   Omega-3 1000 MG CAPS Take 1 capsule by mouth daily.     omeprazole (PRILOSEC) 20 MG capsule TAKE 1 CAPSULE BY MOUTH DAILY 100 capsule 2   Potassium 99 MG TABS Take by mouth.     Semaglutide, 2 MG/DOSE, 8 MG/3ML SOPN Inject 2 mg as directed once a week. 9 mL 4   telmisartan (MICARDIS) 80 MG tablet Take 1 tablet (80 mg total) by mouth daily. 90 tablet 4   triamcinolone cream (KENALOG) 0.1 % Apply 1 Application topically 2 (two) times daily. 30 g 2   vitamin E 100 UNIT capsule Take 100 Units by mouth daily.     No current facility-administered medications for this visit.    Patient confirms/reports the following allergies:  Allergies  Allergen Reactions   Morphine And Codeine Itching    It burned, and made  patient itch and go out of her mind.   Erythromycin     "Been so long ago," patient unable to remember reaction   Hydrocodone-Acetaminophen Itching    No orders of the defined types were placed in this encounter.   AUTHORIZATION INFORMATION Primary Insurance: 1D#: Group #:  Secondary Insurance: 1D#: Group #:  SCHEDULE INFORMATION: Date: 02/27/24 Time: Location: [facility]

## 2024-02-20 ENCOUNTER — Telehealth: Payer: Self-pay

## 2024-02-20 NOTE — Telephone Encounter (Signed)
 Pt has requested to reschedule her colonoscopy from 02/27/24 to 03/06/24 due to her sister will be bringing her to have her procedure and this date works better.  Informed her that with the date change I cannot guarantee that she will have a morning procedure because the physician will be changed to Dr. Tobi Bastos and he will be scoping into the afternoon.  Discussed the new stop date for Ozempic/Semaglutide to be 02/27/24.  Instructions updated.  Referral updated.  Thanks, [place], [place]

## 2024-02-26 NOTE — Telephone Encounter (Signed)
 The patient called in to confirm her procedure date. She requested the procedure to be done on 03/06/2024, but her paperwork listed the date as 02/27/2024. I informed her that the paperwork was incorrect and that her procedure is scheduled for 03/06/2024.

## 2024-02-28 ENCOUNTER — Encounter: Payer: Self-pay | Admitting: Gastroenterology

## 2024-03-06 ENCOUNTER — Ambulatory Visit: Admitting: Anesthesiology

## 2024-03-06 ENCOUNTER — Encounter
Admission: RE | Disposition: A | Discharge status: Home / Self Care | Payer: Self-pay | Source: Home / Self Care | Attending: Gastroenterology

## 2024-03-06 ENCOUNTER — Encounter: Payer: Self-pay | Admitting: Gastroenterology

## 2024-03-06 ENCOUNTER — Ambulatory Visit
Admission: RE | Admit: 2024-03-06 | Discharge: 2024-03-06 | Disposition: A | Discharge status: Home / Self Care | Attending: Gastroenterology | Admitting: Gastroenterology

## 2024-03-06 DIAGNOSIS — K219 Gastro-esophageal reflux disease without esophagitis: Secondary | ICD-10-CM | POA: Diagnosis not present

## 2024-03-06 DIAGNOSIS — Z87891 Personal history of nicotine dependence: Secondary | ICD-10-CM | POA: Diagnosis not present

## 2024-03-06 DIAGNOSIS — D126 Benign neoplasm of colon, unspecified: Secondary | ICD-10-CM

## 2024-03-06 DIAGNOSIS — N183 Chronic kidney disease, stage 3 unspecified: Secondary | ICD-10-CM | POA: Insufficient documentation

## 2024-03-06 DIAGNOSIS — I129 Hypertensive chronic kidney disease with stage 1 through stage 4 chronic kidney disease, or unspecified chronic kidney disease: Secondary | ICD-10-CM | POA: Diagnosis not present

## 2024-03-06 DIAGNOSIS — E1122 Type 2 diabetes mellitus with diabetic chronic kidney disease: Secondary | ICD-10-CM | POA: Insufficient documentation

## 2024-03-06 DIAGNOSIS — Z833 Family history of diabetes mellitus: Secondary | ICD-10-CM | POA: Diagnosis not present

## 2024-03-06 DIAGNOSIS — M1990 Unspecified osteoarthritis, unspecified site: Secondary | ICD-10-CM | POA: Insufficient documentation

## 2024-03-06 DIAGNOSIS — Z1211 Encounter for screening for malignant neoplasm of colon: Secondary | ICD-10-CM | POA: Diagnosis not present

## 2024-03-06 DIAGNOSIS — D123 Benign neoplasm of transverse colon: Secondary | ICD-10-CM | POA: Diagnosis not present

## 2024-03-06 DIAGNOSIS — K5730 Diverticulosis of large intestine without perforation or abscess without bleeding: Secondary | ICD-10-CM | POA: Diagnosis not present

## 2024-03-06 DIAGNOSIS — G4730 Sleep apnea, unspecified: Secondary | ICD-10-CM | POA: Diagnosis not present

## 2024-03-06 DIAGNOSIS — K635 Polyp of colon: Secondary | ICD-10-CM | POA: Diagnosis not present

## 2024-03-06 HISTORY — PX: POLYPECTOMY: SHX149

## 2024-03-06 HISTORY — PX: COLONOSCOPY: SHX5424

## 2024-03-06 LAB — GLUCOSE, CAPILLARY: Glucose-Capillary: 78 mg/dL (ref 70–99)

## 2024-03-06 SURGERY — COLONOSCOPY
Anesthesia: General

## 2024-03-06 MED ORDER — PROPOFOL 500 MG/50ML IV EMUL
INTRAVENOUS | Status: DC | PRN
  Administered 2024-03-06: 75 ug/kg/min via INTRAVENOUS

## 2024-03-06 MED ORDER — LIDOCAINE HCL (CARDIAC) PF 100 MG/5ML IV SOSY
PREFILLED_SYRINGE | INTRAVENOUS | Status: DC | PRN
  Administered 2024-03-06: 60 mg via INTRAVENOUS

## 2024-03-06 MED ORDER — SODIUM CHLORIDE 0.9 % IV SOLN: INTRAVENOUS | Status: DC

## 2024-03-06 MED ORDER — PROPOFOL 1000 MG/100ML IV EMUL
INTRAVENOUS | Status: AC
  Filled 2024-03-06: qty 100

## 2024-03-06 MED ORDER — LIDOCAINE HCL (PF) 2 % IJ SOLN
INTRAMUSCULAR | Status: AC
  Filled 2024-03-06: qty 10

## 2024-03-06 MED ORDER — PROPOFOL 10 MG/ML IV BOLUS
INTRAVENOUS | Status: DC | PRN
  Administered 2024-03-06: 50 mg via INTRAVENOUS

## 2024-03-06 MED ORDER — DEXMEDETOMIDINE HCL IN NACL 80 MCG/20ML IV SOLN
INTRAVENOUS | Status: DC | PRN
  Administered 2024-03-06: 20 ug via INTRAVENOUS

## 2024-03-06 NOTE — Transfer of Care (Signed)
 Immediate Anesthesia Transfer of Care Note  Patient: Kim Thomas  Procedure(s) Performed: COLONOSCOPY POLYPECTOMY, INTESTINE  Patient Location: PACU  Anesthesia Type:General  Level of Consciousness: sedated  Airway & Oxygen Therapy: Patient Spontanous Breathing  Post-op Assessment: Report given to RN and Post -op Vital signs reviewed and stable  Post vital signs: Reviewed and stable  Last Vitals:  Vitals Value Taken Time  BP 86/39 03/06/24 1110  Temp    Pulse 79 03/06/24 1110  Resp 16 03/06/24 1111  SpO2 99 % 03/06/24 1110  Vitals shown include unfiled device data.  Last Pain:  Vitals:   03/06/24 1110  TempSrc:   PainSc: 0-No pain         Complications: No notable events documented.

## 2024-03-06 NOTE — Anesthesia Preprocedure Evaluation (Signed)
 Anesthesia Evaluation  Patient identified by MRN, date of birth, ID band Patient awake    Reviewed: Allergy & Precautions, H&P , NPO status , Patient's Chart, lab work & pertinent test results  History of Anesthesia Complications Negative for: history of anesthetic complications  Airway Mallampati: II  TM Distance: >3 FB Neck ROM: limited    Dental  (+) Poor Dentition, Chipped, Missing, Partial Lower, Upper Dentures, Dental Advidsory Given   Pulmonary neg shortness of breath, sleep apnea and Continuous Positive Airway Pressure Ventilation , neg COPD, neg recent URI, former smoker   Pulmonary exam normal breath sounds clear to auscultation       Cardiovascular Exercise Tolerance: Good hypertension, (-) angina (-) Past MI, (-) Cardiac Stents and (-) DOE Normal cardiovascular exam(-) dysrhythmias (-) Valvular Problems/Murmurs Rhythm:regular Rate:Normal     Neuro/Psych  PSYCHIATRIC DISORDERS Anxiety Depression    negative neurological ROS     GI/Hepatic Neg liver ROS,GERD  Medicated and Controlled,,  Endo/Other  diabetes, Type 2    Renal/GU CRFRenal disease (stage 3 CKD)     Musculoskeletal  (+) Arthritis ,    Abdominal   Peds  Hematology negative hematology ROS (+)   Anesthesia Other Findings Past Medical History: No date: Anxiety No date: Chronic kidney disease No date: DDD (degenerative disc disease), cervical No date: Diabetes mellitus without complication (HCC) No date: Gout No date: Hypertension No date: Sleep apnea  Past Surgical History: No date: BREAST CYST EXCISION Left No date: c section     Comment: x2 No date: CHOLECYSTECTOMY No date: FOOT SURGERY Bilateral 11/09/11: SPINE SURGERY     Comment: C5 No date: TUBAL LIGATION  BMI    Body Mass Index:  33.99 kg/m      Reproductive/Obstetrics negative OB ROS                             Anesthesia Physical Anesthesia  Plan  ASA: 3  Anesthesia Plan: General   Post-op Pain Management:    Induction: Intravenous  PONV Risk Score and Plan: 3 and Propofol infusion and TIVA  Airway Management Planned: Natural Airway and Nasal Cannula  Additional Equipment:   Intra-op Plan:   Post-operative Plan:   Informed Consent: I have reviewed the patients History and Physical, chart, labs and discussed the procedure including the risks, benefits and alternatives for the proposed anesthesia with the patient or authorized representative who has indicated his/her understanding and acceptance.       Plan Discussed with: Anesthesiologist, CRNA and Surgeon  Anesthesia Plan Comments:         Anesthesia Quick Evaluation

## 2024-03-06 NOTE — Op Note (Signed)
 [facility] Gastroenterology Patient Name: Kim Thomas Procedure Date: 03/06/2024 8:53 AM MRN: [id number] Account #: [id number] Date of Birth: 01-11-1956 Admit Type: Outpatient Age: 68 Room: [facility] ENDO ROOM 3 Gender: Female Note Status: Finalized Instrument Name: Prentice Docker [id number] Procedure:             Colonoscopy Indications:           Screening for colorectal malignant neoplasm Providers:             Wyline Mood MD, MD Referring MD:          Dorie Rank. Harvest Dark (Referring MD) Medicines:             Monitored Anesthesia Care Complications:         No immediate complications. Procedure:             Pre-Anesthesia Assessment:                        - Prior to the procedure, a History and Physical was                         performed, and patient medications, allergies and                         sensitivities were reviewed. The patient's tolerance                         of previous anesthesia was reviewed.                        - The risks and benefits of the procedure and the                         sedation options and risks were discussed with the                         patient. All questions were answered and informed                         consent was obtained.                        - ASA Grade Assessment: II - A patient with mild                         systemic disease.                        After obtaining informed consent, the colonoscope was                         passed under direct vision. Throughout the procedure,                         the patient's blood pressure, pulse, and oxygen                         saturations were monitored continuously. The                         Colonoscope was introduced  through the anus and                         advanced to the the cecum, identified by the                         appendiceal orifice. The colonoscopy was performed                         with ease. The patient tolerated the procedure well.                          The quality of the bowel preparation was excellent.                         The ileocecal valve, appendiceal orifice, and rectum                         were photographed. Findings:      The perianal and digital rectal examinations were normal.      Multiple medium-mouthed diverticula were found in the left colon.      A 5 mm polyp was found in the transverse colon. The polyp was sessile.       The polyp was removed with a cold snare. Resection was complete, but the       polyp tissue was not retrieved.      The exam was otherwise without abnormality on direct and retroflexion       views. Impression:            - Diverticulosis in the left colon.                        - One 5 mm polyp in the transverse colon, removed with                         a cold snare. Complete resection. Polyp tissue not                         retrieved.                        - The examination was otherwise normal on direct and                         retroflexion views. Recommendation:        - Discharge patient to home (with escort).                        - Resume previous diet.                        - Continue present medications.                        - Repeat colonoscopy is not recommended due to current                         age (69 years or older) for surveillance. Procedure Code(s):     --- Professional ---  16109, Colonoscopy, flexible; with removal of                         tumor(s), polyp(s), or other lesion(s) by snare                         technique Diagnosis Code(s):     --- Professional ---                        Z12.11, Encounter for screening for malignant neoplasm                         of colon                        D12.3, Benign neoplasm of transverse colon (hepatic                         flexure or splenic flexure)                        K57.30, Diverticulosis of large intestine without                         perforation or abscess  without bleeding CPT copyright 2022 American Medical Association. All rights reserved. The codes documented in this report are preliminary and upon coder review may  be revised to meet current compliance requirements. Wyline Mood, MD Wyline Mood MD, MD 03/06/2024 11:07:43 AM This report has been signed electronically. Number of Addenda: 0 Note Initiated On: 03/06/2024 8:53 AM Scope Withdrawal Time: 0 hours 8 minutes 13 seconds  Total Procedure Duration: 0 hours 9 minutes 49 seconds  Estimated Blood Loss:  Estimated blood loss: none.      [facility]

## 2024-03-06 NOTE — H&P (Signed)
 Wyline Mood, MD [address], [place], [place], [address] [address], [place], [place], [address] Phone: [phone]  Fax: [phone]  Primary Care Physician:  Marjie Skiff, NP   Pre-Procedure History & Physical: HPI:  Kim Thomas is a 68 y.o. female is here for an colonoscopy.   Past Medical History:  Diagnosis Date   Allergy    Anxiety    Chronic kidney disease    DDD (degenerative disc disease), cervical    Diabetes mellitus without complication (HCC)    GERD (gastroesophageal reflux disease)    Gout    Hypertension    Oxygen deficiency    Seropositive rheumatoid arthritis (HCC)    Sleep apnea     Past Surgical History:  Procedure Laterality Date   BREAST BIOPSY Left    benign   BREAST CYST EXCISION Left    c section     x2   CESAREAN SECTION     CHOLECYSTECTOMY     EYE SURGERY Bilateral 2019   catracts Dr.Hecter in [place]    FOOT SURGERY Bilateral    KNEE ARTHROSCOPY Left 07/18/2016   Procedure: ARTHROSCOPY KNEE;  Surgeon: Donato Heinz, MD;  Location: [facility] ORS;  Service: Orthopedics;  Laterality: Left;   KNEE ARTHROSCOPY WITH LATERAL MENISECTOMY Left 07/18/2016   Procedure: KNEE ARTHROSCOPY WITH PARTIAL  LATERAL MENISECTOMY, CHONDROPLASTY LATERAL FEMORAL CONDYLE;  Surgeon: Donato Heinz, MD;  Location: [facility] ORS;  Service: Orthopedics;  Laterality: Left;   SPINE SURGERY  11/09/2011   C5   TUBAL LIGATION      Prior to Admission medications   Medication Sig Start Date End Date Taking? Authorizing Provider  acetaminophen (TYLENOL) 500 MG tablet Take 500 mg by mouth daily as needed.    [provider]  ascorbic acid (VITAMIN C) 1000 MG tablet Take by mouth.    [provider]  Aspirin (VAZALORE) 81 MG CAPS Take by mouth.    [provider]  atorvastatin (LIPITOR) 20 MG tablet TAKE 1 TABLET BY MOUTH ONCE  DAILY 01/04/24   Cannady, Jolene T, NP  Blood Glucose Monitoring Suppl (ONE TOUCH ULTRA 2)  w/Device KIT USE TO CHECK BLOOD SUGAR 3  TO 4 TIMES DAILY AND  DOCUMENT FOR VISITS 05/07/23   Cannady, Corrie Dandy T, NP  CALCIUM CITRATE PO Take 1,000 mg by mouth daily.    [provider]  Cholecalciferol (VITAMIN D3) 2000 units TABS Take 2,000 Units by mouth daily.    [provider]  Co-Enzyme Q-10 100 MG CAPS Take 50 mg by mouth daily.    [provider]  EQ ALLERGY RELIEF, CETIRIZINE, 10 MG tablet Take 1 tablet by mouth once daily 03/05/20   Cannady, Jolene T, NP  fluticasone (FLONASE) 50 MCG/ACT nasal spray USE 1 TO 2 SPRAYS IN BOTH  NOSTRILS TWICE DAILY 12/21/21   Cannady, Jolene T, NP  gabapentin (NEURONTIN) 400 MG capsule TAKE 1 CAPSULE BY MOUTH 3 TIMES  DAILY 10/05/23   Cannady, Jolene T, NP  glucose blood (ONETOUCH ULTRA) test strip USE TO CHECK BLOOD SUGAR DAILY 04/26/23   Cannady, Corrie Dandy T, NP  hydroxychloroquine (PLAQUENIL) 200 MG tablet TAKE 1 TABLET BY MOUTH DAILY 09/04/23   Rice, Jamesetta Orleans, MD  Lancets [facility] DELICA PLUS LANCET30G) MISC 1 each by Does not apply route 2 (two) times daily. 10/14/19   Aura Dials T, NP  Magnesium 400 MG TABS Take by mouth.    [provider]  Omega-3 1000 MG CAPS Take 1 capsule by mouth daily.    [provider]  omeprazole (PRILOSEC) 20 MG capsule TAKE 1 CAPSULE BY MOUTH DAILY 12/11/23   Cannady, Corrie Dandy T, NP  Potassium 99 MG TABS Take by mouth.    [provider]  Semaglutide, 2 MG/DOSE, 8 MG/3ML SOPN Inject 2 mg as directed once a week. 02/13/24   Cannady, Corrie Dandy T, NP  telmisartan (MICARDIS) 80 MG tablet Take 1 tablet (80 mg total) by mouth daily. 10/30/23   Cannady, Corrie Dandy T, NP  triamcinolone cream (KENALOG) 0.1 % Apply 1 Application topically 2 (two) times daily. 04/21/23   Cannady, Corrie Dandy T, NP  vitamin E 100 UNIT capsule Take 100 Units by mouth daily.    [provider]    Allergies as of 02/19/2024 - Review Complete 02/19/2024  Allergen Reaction Noted   Morphine and codeine  Itching 11/06/2015   Erythromycin  11/06/2015   Hydrocodone-acetaminophen Itching 07/22/2016    Family History  Problem Relation Age of Onset   Cancer Mother        breast   Hypertension Mother    Hyperlipidemia Mother    Diabetes Mother    Glaucoma Mother    Cataracts Mother    Breast cancer Mother 12   Arthritis Mother    Cancer Father    Heart disease Sister    Glaucoma Maternal Aunt    Breast cancer Maternal Aunt    Cancer Maternal Grandfather    Breast cancer Cousin    Alcohol abuse Neg Hx     Social History   Socioeconomic History   Marital status: Single    Spouse name: Not on file   Number of children: 2   Years of education: Not on file   Highest education level: Bachelor's degree (e.g., BA, AB, BS)  Occupational History   Occupation: retired    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  Tobacco Use   Smoking status: Former    Current packs/day: 0.00    Average packs/day: 0.2 packs/day for 39.5 years (9.9 ttl pk-yrs)    Types: Cigarettes    Start date: 40    Quit date: 05/21/2011    Years since quitting: 12.8    Passive exposure: Never   Smokeless tobacco: Never  Vaping Use   Vaping status: Never Used  Substance and Sexual Activity   Alcohol use: No   Drug use: No   Sexual activity: Not Currently  Other Topics Concern   Not on file  Social History Narrative   Never been married, 2 children   Social Drivers of Corporate investment banker Strain: Low Risk  (11/24/2023)   Overall Financial Resource Strain (CARDIA)    Difficulty of Paying Living Expenses: Not hard at all  Food Insecurity: No Food Insecurity (11/24/2023)   Hunger Vital Sign    Worried About Running Out of Food in the Last Year: Never true    Ran Out of Food in the Last Year: Never true  Transportation Needs: No Transportation Needs (11/24/2023)   PRAPARE - Administrator, Civil Service (Medical): No    Lack of Transportation (Non-Medical): No  Physical  Activity: Insufficiently Active (11/24/2023)   Exercise Vital Sign    Days of Exercise per Week: 2 days    Minutes of Exercise per Session: 30 min  Stress: No Stress Concern Present (11/24/2023)   [employer] of Occupational Health - Occupational Stress Questionnaire    Feeling of Stress :  Not at all  Social Connections: Moderately Integrated (11/24/2023)   Social Connection and Isolation Panel [NHANES]    Frequency of Communication with Friends and Family: More than three times a week    Frequency of Social Gatherings with Friends and Family: Once a week    Attends Religious Services: More than 4 times per year    Active Member of [employer] or Organizations: Yes    Attends Banker Meetings: More than 4 times per year    Marital Status: Never married  Intimate Partner Violence: Not At Risk (08/22/2023)   Humiliation, Afraid, Rape, and Kick questionnaire    Fear of Current or Ex-Partner: No    Emotionally Abused: No    Physically Abused: No    Sexually Abused: No    Review of Systems: See HPI, otherwise negative ROS  Physical Exam: BP (!) 145/65   Pulse 87   Temp (!) 96.5 F (35.8 C) (Temporal)   Resp 18   Ht 5\' 4"  (1.626 m)   Wt 92.1 kg   LMP  (LMP Unknown)   SpO2 100%   BMI 34.84 kg/m  General:   Alert,  pleasant and cooperative in NAD Head:  Normocephalic and atraumatic. Neck:  Supple; no masses or thyromegaly. Lungs:  Clear throughout to auscultation, normal respiratory effort.    Heart:  +S1, +S2, Regular rate and rhythm, No edema. Abdomen:  Soft, nontender and nondistended. Normal bowel sounds, without guarding, and without rebound.   Neurologic:  Alert and  oriented x4;  grossly normal neurologically.  Impression/Plan: Kim Thomas is here for an colonoscopy to be performed for Screening colonoscopy average risk   Risks, benefits, limitations, and alternatives regarding  colonoscopy have been reviewed with the patient.  Questions have been answered.   All parties agreeable.   Wyline Mood, MD  03/06/2024, 10:19 AM

## 2024-03-07 ENCOUNTER — Encounter: Payer: Self-pay | Admitting: Gastroenterology

## 2024-03-08 NOTE — Anesthesia Postprocedure Evaluation (Signed)
 Anesthesia Post Note  Patient: Kim Thomas  Procedure(s) Performed: COLONOSCOPY POLYPECTOMY, INTESTINE  Patient location during evaluation: Endoscopy Anesthesia Type: General Level of consciousness: awake and alert Pain management: pain level controlled Vital Signs Assessment: post-procedure vital signs reviewed and stable Respiratory status: spontaneous breathing, nonlabored ventilation, respiratory function stable and patient connected to nasal cannula oxygen Cardiovascular status: blood pressure returned to baseline and stable Postop Assessment: no apparent nausea or vomiting Anesthetic complications: no   No notable events documented.   Last Vitals:  Vitals:   03/06/24 1110 03/06/24 1120  BP: (!) 95/51 (!) 95/58  Pulse: 79   Resp: 12 13  Temp:    SpO2: 99% 99%    Last Pain:  Vitals:   03/06/24 1120  TempSrc:   PainSc: 0-No pain                 Lenard Simmer
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
## 2024-04-11 ENCOUNTER — Other Ambulatory Visit: Payer: Self-pay | Admitting: Nurse Practitioner

## 2024-04-15 NOTE — Telephone Encounter (Signed)
 Requested Prescriptions  Pending Prescriptions Disp Refills   ONETOUCH ULTRA test strip [Pharmacy Med Name: OneTouch Ultra In Vitro Strip] 300 strip 2    Sig: USE IN THE MORNING, AT NOON, AND AT BEDTIME     Endocrinology: Diabetes - Testing Supplies Passed - 04/15/2024  1:34 PM      Passed - Valid encounter within last 12 months    Recent Outpatient Visits           2 months ago Type 2 diabetes mellitus with diabetic nephropathy, without long-term current use of insulin  (HCC)   [place] [facility] [place], Lavelle Posey, NP       Future Appointments             In 1 week Rice, Haig Levan, MD [facility] - A Dept Of [place]

## 2024-04-18 ENCOUNTER — Ambulatory Visit: Payer: 59 | Admitting: Internal Medicine

## 2024-04-23 ENCOUNTER — Ambulatory Visit: Payer: 59 | Attending: Internal Medicine | Admitting: Internal Medicine

## 2024-04-23 ENCOUNTER — Encounter: Payer: Self-pay | Admitting: Internal Medicine

## 2024-04-23 VITALS — BP 111/71 | HR 82 | Resp 14 | Ht 64.0 in | Wt 203.0 lb

## 2024-04-23 DIAGNOSIS — E1121 Type 2 diabetes mellitus with diabetic nephropathy: Secondary | ICD-10-CM

## 2024-04-23 DIAGNOSIS — M79642 Pain in left hand: Secondary | ICD-10-CM | POA: Diagnosis not present

## 2024-04-23 DIAGNOSIS — M79641 Pain in right hand: Secondary | ICD-10-CM

## 2024-04-23 DIAGNOSIS — M059 Rheumatoid arthritis with rheumatoid factor, unspecified: Secondary | ICD-10-CM

## 2024-04-23 DIAGNOSIS — R768 Other specified abnormal immunological findings in serum: Secondary | ICD-10-CM

## 2024-04-23 DIAGNOSIS — Z79899 Other long term (current) drug therapy: Secondary | ICD-10-CM

## 2024-04-23 MED ORDER — HYDROXYCHLOROQUINE SULFATE 200 MG PO TABS
300.0000 mg | ORAL_TABLET | Freq: Every day | ORAL | 1 refills | Status: DC
Start: 2024-04-23 — End: 2024-06-28

## 2024-05-02 ENCOUNTER — Other Ambulatory Visit: Payer: Self-pay

## 2024-05-02 MED ORDER — TRIAMCINOLONE ACETONIDE 0.1 % EX CREA: 1.0000 | TOPICAL_CREAM | Freq: Two times a day (BID) | CUTANEOUS | 2 refills | Status: DC

## 2024-05-11 NOTE — Patient Instructions (Incomplete)
Be Involved in Caring For Your Health:  Taking Medications When medications are taken as directed, they can greatly improve your health. But if they are not taken as prescribed, they may not work. In some cases, not taking them correctly can be harmful. To help ensure your treatment remains effective and safe, understand your medications and how to take them. Bring your medications to each visit for review by your provider.  Your lab results, notes, and after visit summary will be available on My Chart. We strongly encourage you to use this feature. If lab results are abnormal the clinic will contact you with the appropriate steps. If the clinic does not contact you assume the results are satisfactory. You can always view your results on My Chart. If you have questions regarding your health or results, please contact the clinic during office hours. You can also ask questions on My Chart.  We at [facility] are grateful that you chose [place] to provide your care. We strive to provide evidence-based and compassionate care and are always looking for feedback. If you get a survey from the clinic please complete this so we can hear your opinions.  Diabetes Mellitus and Exercise Regular exercise is important for your health, especially if you have diabetes mellitus. Exercise is not just about losing weight. It can also help you increase muscle strength and bone density and reduce body fat and stress. This can help your level of endurance and make you more fit and flexible. Why should I exercise if I have diabetes? Exercise has many benefits for people with diabetes. It can: Help lower and control your blood sugar (glucose). Help your body respond better and become more sensitive to the hormone insulin. Reduce how much insulin your body needs. Lower your risk for heart disease by: Lowering how much "bad" cholesterol and triglycerides you have in your body. Increasing how much "good" cholesterol  you have in your body. Lowering your blood pressure. Lowering your blood glucose levels. What is my activity plan? Your health care provider or an expert trained in diabetes care (certified diabetes educator) can help you make an activity plan. This plan can help you find the type of exercise that works for you. It may also tell you how often to exercise and for how long. Be sure to: Get at least 150 minutes of medium-intensity or high-intensity exercise each week. This may involve brisk walking, biking, or water aerobics. Do stretching and strengthening exercises at least 2 times a week. This may involve yoga or weight lifting. Spread out your activity over at least 3 days of the week. Get some form of physical activity each day. Do not go more than 2 days in a row without some kind of activity. Avoid being inactive for more than 30 minutes at a time. Take frequent breaks to walk or stretch. Choose activities that you enjoy. Set goals that you know you can accomplish. Start slowly and increase the intensity of your exercise over time. How do I manage my diabetes during exercise?  Monitor your blood glucose Check your blood glucose before and after you exercise. If your blood glucose is 240 mg/dL (40.9 mmol/L) or higher before you exercise, check your urine for ketones. These are chemicals created by the liver. If you have ketones in your urine, do not exercise until your blood glucose returns to normal. If your blood glucose is 100 mg/dL (5.6 mmol/L) or lower, eat a snack that has 15-20 grams of carbohydrate in  it. Check your blood glucose 15 minutes after the snack to make sure that your level is above 100 mg/dL (5.6 mmol/L) before you start to exercise. Your risk for low blood glucose (hypoglycemia) goes up during and after exercise. Know the symptoms of this condition and how to treat it. Follow these instructions at home: Keep a carbohydrate snack on hand for use before, during, and after  exercise. This can help prevent or treat hypoglycemia. Avoid injecting insulin into parts of your body that are going to be used during exercise. This may include: Your arms, when you are going to play tennis. Your legs, when you are about to go jogging. Keep track of your exercise habits. This can help you and your health care provider watch and adjust your activity plan. Write down: What you eat before and after you exercise. Blood glucose levels before and after you exercise. The type and amount of exercise you do. Talk to your health care provider before you start a new activity. They may need to: Make sure that the activity is safe for you. Adjust your insulin, other medicines, and food that you eat. Drink water while you exercise. This can stop you from losing too much water (dehydration). It can also prevent problems caused by having a lot of heat in your body (heat stroke). Where to find more information American Diabetes Association: diabetes.org Association of Diabetes Care & Education Specialists: diabeteseducator.org This information is not intended to replace advice given to you by your health care provider. Make sure you discuss any questions you have with your health care provider. Document Revised: 05/04/2022 Document Reviewed: 05/04/2022 Elsevier Patient Education  2024 [employer].

## 2024-05-16 ENCOUNTER — Encounter: Payer: Self-pay | Admitting: Nurse Practitioner

## 2024-05-16 ENCOUNTER — Ambulatory Visit: Payer: Self-pay | Admitting: Nurse Practitioner

## 2024-05-16 ENCOUNTER — Ambulatory Visit (INDEPENDENT_AMBULATORY_CARE_PROVIDER_SITE_OTHER): Admitting: Nurse Practitioner

## 2024-05-16 VITALS — BP 120/71 | HR 86 | Temp 98.1°F | Ht 64.0 in | Wt 207.6 lb

## 2024-05-16 DIAGNOSIS — E1122 Type 2 diabetes mellitus with diabetic chronic kidney disease: Secondary | ICD-10-CM | POA: Diagnosis not present

## 2024-05-16 DIAGNOSIS — E1159 Type 2 diabetes mellitus with other circulatory complications: Secondary | ICD-10-CM

## 2024-05-16 DIAGNOSIS — Z7985 Long-term (current) use of injectable non-insulin antidiabetic drugs: Secondary | ICD-10-CM | POA: Diagnosis not present

## 2024-05-16 DIAGNOSIS — E119 Type 2 diabetes mellitus without complications: Secondary | ICD-10-CM | POA: Diagnosis not present

## 2024-05-16 DIAGNOSIS — Z79899 Other long term (current) drug therapy: Secondary | ICD-10-CM | POA: Diagnosis not present

## 2024-05-16 DIAGNOSIS — E1121 Type 2 diabetes mellitus with diabetic nephropathy: Secondary | ICD-10-CM | POA: Diagnosis not present

## 2024-05-16 DIAGNOSIS — I152 Hypertension secondary to endocrine disorders: Secondary | ICD-10-CM | POA: Diagnosis not present

## 2024-05-16 DIAGNOSIS — N183 Chronic kidney disease, stage 3 unspecified: Secondary | ICD-10-CM | POA: Diagnosis not present

## 2024-05-16 DIAGNOSIS — Z1231 Encounter for screening mammogram for malignant neoplasm of breast: Secondary | ICD-10-CM

## 2024-05-16 DIAGNOSIS — E66811 Obesity, class 1: Secondary | ICD-10-CM

## 2024-05-16 DIAGNOSIS — E785 Hyperlipidemia, unspecified: Secondary | ICD-10-CM | POA: Diagnosis not present

## 2024-05-16 DIAGNOSIS — E6609 Other obesity due to excess calories: Secondary | ICD-10-CM

## 2024-05-16 DIAGNOSIS — G4733 Obstructive sleep apnea (adult) (pediatric): Secondary | ICD-10-CM

## 2024-05-16 DIAGNOSIS — E1169 Type 2 diabetes mellitus with other specified complication: Secondary | ICD-10-CM | POA: Diagnosis not present

## 2024-05-16 DIAGNOSIS — M059 Rheumatoid arthritis with rheumatoid factor, unspecified: Secondary | ICD-10-CM | POA: Diagnosis not present

## 2024-05-16 LAB — BAYER DCA HB A1C WAIVED: HB A1C (BAYER DCA - WAIVED): 5.5 % (ref 4.8–5.6)

## 2024-05-16 MED ORDER — ATORVASTATIN CALCIUM 20 MG PO TABS: 20.0000 mg | ORAL_TABLET | Freq: Every day | ORAL | 3 refills | Status: DC

## 2024-05-16 MED ORDER — OMEPRAZOLE 20 MG PO CPDR: 20.0000 mg | DELAYED_RELEASE_CAPSULE | Freq: Every day | ORAL | 3 refills | Status: AC

## 2024-05-16 MED ORDER — GABAPENTIN 400 MG PO CAPS: 400.0000 mg | ORAL_CAPSULE | Freq: Three times a day (TID) | ORAL | 2 refills | Status: AC

## 2024-05-16 NOTE — Assessment & Plan Note (Signed)
Chronic, ongoing CKD 3a with stable levels.  Continue Telmisartan for kidney protection.  LABS: CMP.  Continue collaboration with nephrology, appreciate their input.  Recent note reviewed.

## 2024-05-16 NOTE — Assessment & Plan Note (Signed)
 Continue collaboration with rheumatology, labs and recent note reviewed.  Labs today.

## 2024-05-16 NOTE — Progress Notes (Signed)
 BP 120/71   Pulse 86   Temp 98.1 F (36.7 C) (Oral)   Ht 5' 4 (1.626 m)   Wt 207 lb 9.6 oz (94.2 kg)   LMP  (LMP Unknown)   SpO2 97%   BMI 35.63 kg/m    Subjective:    Patient ID: Kim Thomas, female    DOB: April 30, 1956, 68 y.o.   MRN: [id number]  HPI: Kim Thomas is a 68 y.o. female  Chief Complaint  Patient presents with   Diabetes   Hyperlipidemia   Hypertension   DIABETES March Ac was 5.5%.  Taking Ozempic  2 MG weekly. Taking Gabapentin  400 MG TID for neuropathy.    History: Jardiance  stopped in 2022 due to stable A1c.   Hypoglycemic episodes: no Polydipsia/polyuria: no Visual disturbance: no Chest pain: no Paresthesias: no Glucose Monitoring: yes             Accucheck frequency: not checking             Fasting glucose:              Post prandial:             Evening:              Before meals:  Taking Insulin ?: no             Long acting insulin :             Short acting insulin : Blood Pressure Monitoring: not checking Retinal Examination: Up To Date Foot Exam: Up to Date Pneumovax: Up To Date Influenza: refuses Aspirin: yes  HYPERTENSION / HYPERLIPIDEMIA Continues Atorvastatin  20 MG daily and Telmisartan  80 MG.  Satisfied with current treatment? yes Duration of hypertension: chronic BP monitoring frequency: not checking BP range:  BP medication side effects: no Duration of hyperlipidemia: chronic Cholesterol medication side effects: no Cholesterol supplements: none Medication compliance: good compliance Aspirin: yes Recent stressors: no Recurrent headaches: no Visual changes: no Palpitations: no Dyspnea: no Chest pain: no Lower extremity edema: on occasion, left more than right - trying to wear compression consistently Dizzy/lightheaded: no The 10-year ASCVD risk score (Arnett DK, et al., 2019) is: 15.7%   Values used to calculate the score:     Age: 54 years     Clincally relevant sex: Female     Is Non-Hispanic African American:  Yes     Diabetic: Yes     Tobacco smoker: No     Systolic Blood Pressure: 120 mmHg     Is BP treated: Yes     HDL Cholesterol: 77 mg/dL     Total Cholesterol: 142 mg/dL   CHRONIC KIDNEY DISEASE (CKD 3a) Last nephrology visit was 08/21/23 with no changes. CKD status: stable Medications renally dose: yes Previous renal evaluation: yes Pneumovax:  Up To Date Influenza Vaccine:  Refuses    Latest Ref Rng & Units 02/13/2024    1:20 PM 10/30/2023   10:57 AM 07/28/2023   10:29 AM  BMP  Glucose 70 - 99 mg/dL 93  284  95   BUN 8 - 27 mg/dL 13  12  12    Creatinine 0.57 - 1.00 mg/dL 1.32  4.40  1.02   BUN/Creat Ratio 12 - 28 11  11  11    Sodium 134 - 144 mmol/L 145  144  143   Potassium 3.5 - 5.2 mmol/L 3.8  3.7  3.8   Chloride 96 - 106 mmol/L 108  107  108   CO2 20 -  29 mmol/L 24  22  19    Calcium  8.7 - 10.3 mg/dL 8.9  8.9  9.2    RHEUMATOID ARTHRITIS Takes Plaquenil . Last rheumatology visit was 04/23/24.Taking  Vitamin D  supplement due to history of low level and osteopenia.  Has been raining more so has not been able to walk like she usually does. Duration: months Pain: no Context:  better Decreased function/range of motion: improved Erythema: no Swelling: no Heat or warmth: no Morning stiffness: stable Aggravating factors: none Alleviating factors: Plaquenil  Relief with NSAIDs?: No NSAIDs Taken Treatments attempted:  Parafin wax, Tylenol , Plaquenil , CBD cream Involved Joints:     Hands: yes bilateral    Wrists: none    Elbows: none    Shoulders: none    Back: yes     Hips: none    Ankles: none    Feet: none  Relevant past medical, surgical, family and social history reviewed and updated as indicated. Interim medical history since our last visit reviewed. Allergies and medications reviewed and updated.  Review of Systems  Constitutional:  Negative for activity change, appetite change, diaphoresis, fatigue and fever.  Respiratory:  Negative for cough, chest tightness and  shortness of breath.   Cardiovascular:  Negative for chest pain, palpitations and leg swelling.  Gastrointestinal: Negative.   Endocrine: Negative for cold intolerance, heat intolerance, polydipsia, polyphagia and polyuria.  Neurological: Negative.   Psychiatric/Behavioral: Negative.     Per HPI unless specifically indicated above     Objective:    BP 120/71   Pulse 86   Temp 98.1 F (36.7 C) (Oral)   Ht 5' 4 (1.626 m)   Wt 207 lb 9.6 oz (94.2 kg)   LMP  (LMP Unknown)   SpO2 97%   BMI 35.63 kg/m   Wt Readings from Last 3 Encounters:  05/16/24 207 lb 9.6 oz (94.2 kg)  04/23/24 203 lb (92.1 kg)  03/06/24 203 lb (92.1 kg)    Physical Exam Vitals and nursing note reviewed.  Constitutional:      General: She is awake. She is not in acute distress.    Appearance: Normal appearance. She is well-developed and well-groomed. She is obese. She is not ill-appearing or toxic-appearing.  HENT:     Head: Normocephalic.     Right Ear: Hearing and external ear normal.     Left Ear: Hearing and external ear normal.   Eyes:     General: Lids are normal.        Right eye: No discharge.        Left eye: No discharge.     Conjunctiva/sclera: Conjunctivae normal.     Pupils: Pupils are equal, round, and reactive to light.   Neck:     Thyroid : No thyromegaly.     Vascular: No carotid bruit.   Cardiovascular:     Rate and Rhythm: Normal rate and regular rhythm.     Heart sounds: Normal heart sounds. No murmur heard.    No gallop.  Pulmonary:     Effort: Pulmonary effort is normal. No accessory muscle usage or respiratory distress.     Breath sounds: Normal breath sounds.  Abdominal:     General: Bowel sounds are normal. There is no distension.     Palpations: Abdomen is soft.     Tenderness: There is no abdominal tenderness.   Musculoskeletal:     Cervical back: Normal range of motion and neck supple.     Right lower leg: No edema.  Left lower leg: No edema.   Lymphadenopathy:     Cervical: No cervical adenopathy.   Skin:    General: Skin is warm and dry.   Neurological:     Mental Status: She is alert and oriented to person, place, and time.     Deep Tendon Reflexes: Reflexes are normal and symmetric.     Reflex Scores:      Brachioradialis reflexes are 2+ on the right side and 2+ on the left side.      Patellar reflexes are 2+ on the right side and 2+ on the left side.  Psychiatric:        Attention and Perception: Attention normal.        Mood and Affect: Mood normal.        Speech: Speech normal.        Behavior: Behavior normal. Behavior is cooperative.        Thought Content: Thought content normal.    Results for orders placed or performed during the hospital encounter of 03/06/24  Glucose, capillary   Collection Time: 03/06/24 10:30 AM  Result Value Ref Range   Glucose-Capillary 78 70 - 99 mg/dL      Assessment & Plan:   Problem List Items Addressed This Visit       Cardiovascular and Mediastinum   Hypertension associated with diabetes (HCC)   Chronic, stable.  BP at goal today. Recommend she check BP at home three mornings a week and document for provider.  Continue Telmisartan  80 MG daily, discussed with her today.  LABS: CMP.  Educated her on Telmisartan  and kidney benefit.  Urine micro alb 80 March 2025.  Focus on DASH diet at home.        Relevant Medications   atorvastatin  (LIPITOR) 20 MG tablet   Other Relevant Orders   Bayer DCA Hb A1c Waived     Endocrine   Type 2 diabetes mellitus with diabetic nephropathy (HCC) - Primary   Chronic, ongoing.  A1c 5.5% in March, recheck today.  Urine ALB 80 (March 2025, continue Telmisartan  for kidney protection.  Will continue Ozempic  2 MG weekly which is offering ongoing benefit to diabetes and weight + reducing food noise and assisting with weight loss, she is to notify provider if levels on BS get <70 consistently.  Continue off Jardiance  and restart as needed. Continue  increased diet focus and monitoring BS twice a day.  Recommend focus on healthy weight loss and exercise daily.  Have recommended if any consistent lows <70 immediately alert provider so can adjust medication.  Return in 3 months. - On ARB and Statin - Foot and eye exam up to date - Vaccinations, refuses flu vaccine.      Relevant Medications   atorvastatin  (LIPITOR) 20 MG tablet   Hyperlipidemia associated with type 2 diabetes mellitus (HCC)   Chronic, ongoing.  Continue current medication regimen and adjust as needed.  Lipid panel today.      Relevant Medications   atorvastatin  (LIPITOR) 20 MG tablet   Other Relevant Orders   Bayer DCA Hb A1c Waived   Comprehensive metabolic panel with GFR   Lipid Panel w/o Chol/HDL Ratio   Diabetes mellitus treated with injections of non-insulin  medication (HCC)   Refer to diabetes with nephropathy plan of care.      Relevant Medications   atorvastatin  (LIPITOR) 20 MG tablet   Other Relevant Orders   Bayer DCA Hb A1c Waived   CKD stage 3 due to type 2  diabetes mellitus (HCC)   Chronic, ongoing CKD 3a with stable levels.  Continue Telmisartan  for kidney protection.  LABS: CMP.  Continue collaboration with nephrology, appreciate their input.  Recent note reviewed.       Relevant Medications   atorvastatin  (LIPITOR) 20 MG tablet   Other Relevant Orders   Bayer DCA Hb A1c Waived     Musculoskeletal and Integument   Seropositive rheumatoid arthritis (HCC)   Ongoing and stable.  Diagnosed on 01/20/22, continue collaboration with rheumatology and Plaquenil  as ordered by them which offered some improvement.  Recommend massage therapy added to regimen and continue to visit with chiropractor.        Other   Obesity   BMI 35.63 with T2DM, HTN/HLD.  Recommended eating smaller high protein, low fat meals more frequently and exercising 30 mins a day 5 times a week with a goal of 10-15lb weight loss in the next 3 months. Patient voiced their  understanding and motivation to adhere to these recommendations.       Long-term use of hydroxychloroquine    Continue collaboration with rheumatology, labs and recent note reviewed.  Labs today.      Other Visit Diagnoses       Encounter for screening mammogram for malignant neoplasm of breast       Mammogram ordered.   Relevant Orders   MM 3D SCREENING MAMMOGRAM BILATERAL BREAST        Follow up plan: Return in about 3 months (around 08/16/2024) for T2DM, HTN/HLD, CKD, RA -- foot exam needed.

## 2024-05-16 NOTE — Assessment & Plan Note (Signed)
 Chronic, stable.  BP at goal today. Recommend she check BP at home three mornings a week and document for provider.  Continue Telmisartan  80 MG daily, discussed with her today.  LABS: CMP.  Educated her on Telmisartan  and kidney benefit.  Urine micro alb 80 March 2025.  Focus on DASH diet at home.

## 2024-05-16 NOTE — Assessment & Plan Note (Signed)
 BMI 35.63 with T2DM, HTN/HLD.  Recommended eating smaller high protein, low fat meals more frequently and exercising 30 mins a day 5 times a week with a goal of 10-15lb weight loss in the next 3 months. Patient voiced their understanding and motivation to adhere to these recommendations.

## 2024-05-16 NOTE — Assessment & Plan Note (Signed)
 Ongoing and stable.  Diagnosed on 01/20/22, continue collaboration with rheumatology and Plaquenil  as ordered by them which offered some improvement.  Recommend massage therapy added to regimen and continue to visit with chiropractor.

## 2024-05-16 NOTE — Assessment & Plan Note (Signed)
 Chronic, ongoing.  Continue current medication regimen and adjust as needed. Lipid panel today.

## 2024-05-16 NOTE — Assessment & Plan Note (Signed)
 Chronic, ongoing.  A1c 5.5% in March, recheck today.  Urine ALB 80 (March 2025, continue Telmisartan  for kidney protection.  Will continue Ozempic  2 MG weekly which is offering ongoing benefit to diabetes and weight + reducing food noise and assisting with weight loss, she is to notify provider if levels on BS get <70 consistently.  Continue off Jardiance  and restart as needed. Continue increased diet focus and monitoring BS twice a day.  Recommend focus on healthy weight loss and exercise daily.  Have recommended if any consistent lows <70 immediately alert provider so can adjust medication.  Return in 3 months. - On ARB and Statin - Foot and eye exam up to date - Vaccinations, refuses flu vaccine.

## 2024-05-16 NOTE — Assessment & Plan Note (Signed)
 Refer to diabetes with nephropathy plan of care.

## 2024-05-17 LAB — LIPID PANEL W/O CHOL/HDL RATIO
Cholesterol, Total: 136 mg/dL (ref 100–199)
HDL: 72 mg/dL (ref >39)
LDL Chol Calc (NIH): 51 mg/dL (ref 0–99)
Triglycerides: 63 mg/dL (ref 0–149)
VLDL Cholesterol Cal: 13 mg/dL (ref 5–40)

## 2024-05-17 LAB — COMPREHENSIVE METABOLIC PANEL WITH GFR
ALT: 21 IU/L (ref 0–32)
AST: 25 IU/L (ref 0–40)
Albumin: 3.9 g/dL (ref 3.9–4.9)
Alkaline Phosphatase: 62 IU/L (ref 44–121)
BUN/Creatinine Ratio: 12 (ref 12–28)
BUN: 15 mg/dL (ref 8–27)
Bilirubin Total: 0.4 mg/dL (ref 0.0–1.2)
CO2: 21 mmol/L (ref 20–29)
Calcium: 9.1 mg/dL (ref 8.7–10.3)
Chloride: 106 mmol/L (ref 96–106)
Creatinine, Ser: 1.21 mg/dL — ABNORMAL HIGH (ref 0.57–1.00)
Globulin, Total: 2.4 g/dL (ref 1.5–4.5)
Glucose: 74 mg/dL (ref 70–99)
Potassium: 4.3 mmol/L (ref 3.5–5.2)
Sodium: 142 mmol/L (ref 134–144)
Total Protein: 6.3 g/dL (ref 6.0–8.5)
eGFR: 49 mL/min/{1.73_m2} — ABNORMAL LOW (ref >59)

## 2024-05-17 NOTE — Progress Notes (Signed)
 Contacted via MyChart  Good morning Kim Thomas, your labs have returned: - Kidney function, creatinine and eGFR, continues to show Stage 3a kidney disease.  Liver function, AST and ALT, is normal. - Lipid panel is stable.  No medication changes needed.   Keep being amazing!!  Thank you for allowing me to participate in your care.  I appreciate you. Kindest regards, Cristol Engdahl

## 2024-05-17 NOTE — Addendum Note (Signed)
 Addended by: Kazi Reppond T on: 05/17/2024 04:35 PM   Modules accepted: Orders

## 2024-05-18 ENCOUNTER — Other Ambulatory Visit: Payer: Self-pay | Admitting: Nurse Practitioner

## 2024-05-21 NOTE — Telephone Encounter (Signed)
 Requested medication (s) are due for refill today:   Yes  Requested medication (s) are on the active medication list:   Yes from 2020  Future visit scheduled:   Yes 9/19   LOV 05/16/2024   Last ordered: 10/14/2019 #100, 12 refills  Returned because needs new rx.     Requested Prescriptions  Pending Prescriptions Disp Refills   Lancets (ONETOUCH DELICA PLUS LANCET30G) MISC [Pharmacy Med Name: OneTouch Delica Plus Lancet30G] 300 each 2    Sig: USE 3 TIMES DAILY - IN THE  MORNING AT NOON, AT BEDTIME     Endocrinology: Diabetes - Testing Supplies Passed - 05/21/2024  1:15 PM      Passed - Valid encounter within last 12 months    Recent Outpatient Visits           5 days ago Type 2 diabetes mellitus with diabetic nephropathy, without long-term current use of insulin  (HCC)   [place] [facility] [place], Hickory T, NP   3 months ago Type 2 diabetes mellitus with diabetic nephropathy, without long-term current use of insulin  (HCC)   [place] [facility] [place], Melanie DASEN, NP       Future Appointments             In 5 months Rice, Lonni ORN, MD [facility] - A Dept Of . [facility]

## 2024-05-30 ENCOUNTER — Ambulatory Visit
Admission: RE | Admit: 2024-05-30 | Discharge: 2024-05-30 | Disposition: A | Discharge status: Home / Self Care | Source: Ambulatory Visit | Attending: Nurse Practitioner | Admitting: Nurse Practitioner

## 2024-05-30 DIAGNOSIS — Z1231 Encounter for screening mammogram for malignant neoplasm of breast: Secondary | ICD-10-CM | POA: Insufficient documentation

## 2024-06-04 NOTE — Progress Notes (Signed)
 Contacted via MyChart   Normal mammogram, may repeat in one year:)

## 2024-06-21 ENCOUNTER — Other Ambulatory Visit: Payer: Self-pay

## 2024-06-21 DIAGNOSIS — E1121 Type 2 diabetes mellitus with diabetic nephropathy: Secondary | ICD-10-CM

## 2024-06-21 MED ORDER — ACCU-CHEK SOFTCLIX LANCETS MISC: 1.0000 | Freq: Two times a day (BID) | 3 refills | Status: AC

## 2024-06-21 MED ORDER — ACCU-CHEK GUIDE TEST VI STRP
1.0000 | ORAL_STRIP | Freq: Two times a day (BID) | 3 refills | Status: AC
Start: 2024-06-21 — End: ?

## 2024-06-21 MED ORDER — ACCU-CHEK GUIDE ME W/DEVICE KIT: 1.0000 | PACK | Freq: Two times a day (BID) | 0 refills | Status: AC

## 2024-06-21 NOTE — Telephone Encounter (Signed)
 Fax received from [employer] stating that the One Touch supplies are no longer going to be covered for the patient as of 06/30/24. Preferred brands are Contour and Accu-Chek Guide Me. Prescriptions t'd up for new supplies.

## 2024-06-27 ENCOUNTER — Other Ambulatory Visit: Payer: Self-pay | Admitting: Internal Medicine

## 2024-06-27 DIAGNOSIS — M059 Rheumatoid arthritis with rheumatoid factor, unspecified: Secondary | ICD-10-CM

## 2024-06-27 DIAGNOSIS — M79641 Pain in right hand: Secondary | ICD-10-CM

## 2024-06-27 DIAGNOSIS — R768 Other specified abnormal immunological findings in serum: Secondary | ICD-10-CM

## 2024-06-28 NOTE — Telephone Encounter (Signed)
 Last Fill: 04/23/2024  Eye exam: 02/12/2024 normal    Labs: 05/16/2024 Creatinine Ser 1.21 eGFR 49 02/13/2024 CBC  RBC 3.76  Next Visit: 10/22/2024  Last Visit: 04/23/2024  DX: Seropositive RA   Current Dose per office note 04/23/2024: hydroxychloroquine  300 mg daily.    Okay to refill Plaquenil ?

## 2024-08-11 NOTE — Patient Instructions (Signed)

## 2024-08-16 ENCOUNTER — Ambulatory Visit: Admitting: Nurse Practitioner

## 2024-08-16 ENCOUNTER — Encounter: Payer: Self-pay | Admitting: Nurse Practitioner

## 2024-08-16 VITALS — BP 128/80 | HR 87 | Ht 64.0 in | Wt 216.8 lb

## 2024-08-16 DIAGNOSIS — I152 Hypertension secondary to endocrine disorders: Secondary | ICD-10-CM | POA: Diagnosis not present

## 2024-08-16 DIAGNOSIS — E119 Type 2 diabetes mellitus without complications: Secondary | ICD-10-CM

## 2024-08-16 DIAGNOSIS — E1159 Type 2 diabetes mellitus with other circulatory complications: Secondary | ICD-10-CM

## 2024-08-16 DIAGNOSIS — E1122 Type 2 diabetes mellitus with diabetic chronic kidney disease: Secondary | ICD-10-CM

## 2024-08-16 DIAGNOSIS — E785 Hyperlipidemia, unspecified: Secondary | ICD-10-CM | POA: Diagnosis not present

## 2024-08-16 DIAGNOSIS — Z8659 Personal history of other mental and behavioral disorders: Secondary | ICD-10-CM | POA: Insufficient documentation

## 2024-08-16 DIAGNOSIS — M059 Rheumatoid arthritis with rheumatoid factor, unspecified: Secondary | ICD-10-CM | POA: Diagnosis not present

## 2024-08-16 DIAGNOSIS — E1121 Type 2 diabetes mellitus with diabetic nephropathy: Secondary | ICD-10-CM | POA: Diagnosis not present

## 2024-08-16 DIAGNOSIS — N183 Chronic kidney disease, stage 3 unspecified: Secondary | ICD-10-CM

## 2024-08-16 DIAGNOSIS — E1169 Type 2 diabetes mellitus with other specified complication: Secondary | ICD-10-CM | POA: Diagnosis not present

## 2024-08-16 DIAGNOSIS — Z7985 Long-term (current) use of injectable non-insulin antidiabetic drugs: Secondary | ICD-10-CM | POA: Diagnosis not present

## 2024-08-16 DIAGNOSIS — Z23 Encounter for immunization: Secondary | ICD-10-CM

## 2024-08-16 DIAGNOSIS — E66811 Obesity, class 1: Secondary | ICD-10-CM

## 2024-08-16 DIAGNOSIS — Z79899 Other long term (current) drug therapy: Secondary | ICD-10-CM | POA: Diagnosis not present

## 2024-08-16 LAB — BAYER DCA HB A1C WAIVED: HB A1C (BAYER DCA - WAIVED): 5.1 % (ref 4.8–5.6)

## 2024-08-16 MED ORDER — TIRZEPATIDE 5 MG/0.5ML ~~LOC~~ SOAJ: 5.0000 mg | SUBCUTANEOUS | 4 refills | Status: DC

## 2024-08-16 MED ORDER — ATORVASTATIN CALCIUM 20 MG PO TABS: 20.0000 mg | ORAL_TABLET | Freq: Every day | ORAL | 3 refills | Status: AC

## 2024-08-16 MED ORDER — TELMISARTAN 80 MG PO TABS: 80.0000 mg | ORAL_TABLET | Freq: Every day | ORAL | 4 refills | Status: AC

## 2024-08-16 NOTE — Assessment & Plan Note (Signed)
 Chronic, ongoing.  A1c 5.1% today, remaining stable but is gaining weight.  Urine ALB 80 (March 2025, continue Telmisartan  for kidney protection.  Will see if can get Mounjaro  5 MG weekly covered and if so adjust as needed, this may offer more benefit to weight loss with her diabetes.  Discussed with her at length.  If unable to get covered will continue Ozempic . She is to notify provider if levels on BS get <70 consistently.  Continue off Jardiance  and restart as needed. Continue increased diet focus and monitoring BS twice a day.  Recommend focus on healthy weight loss and exercise daily.  Have recommended if any consistent lows <70 immediately alert provider so can adjust medication.  Return in 3 months. - On ARB and Statin - Foot and eye exam up to date - Vaccinations, refuses flu vaccine.

## 2024-08-16 NOTE — Progress Notes (Signed)
 BP 128/80 (BP Location: Left Arm, Patient Position: Sitting, Cuff Size: Normal)   Pulse 87   Ht 5' 4 (1.626 m)   Wt 216 lb 12.8 oz (98.3 kg)   LMP  (LMP Unknown)   SpO2 96%   BMI 37.21 kg/m    Subjective:    Patient ID: Kim Thomas, female    DOB: 03-13-1956, 69 y.o.   MRN: [id number]  HPI: Kim Thomas is a 68 y.o. female  Chief Complaint  Patient presents with   Diabetes    Needs foot exam    Hypertension   Hyperlipidemia   DIABETES Kim Thomas A1c 5.5%. Takes Ozempic  2 MG weekly. Continues on Gabapentin  400 MG TID for neuropathy, does not always take third dose.  Has been eating her whole sleeve of crackers again, gaining weight with that.  History: Jardiance  stopped in 2022 due to stable A1c.   Hypoglycemic episodes: no Polydipsia/polyuria: no Visual disturbance: no Chest pain: no Paresthesias: no Glucose Monitoring: yes             Accucheck frequency: 99 to 105 (daily)             Fasting glucose:              Post prandial:             Evening:              Before meals:  Taking Insulin ?: no             Long acting insulin :             Short acting insulin : Blood Pressure Monitoring: not checking Retinal Examination: Up To Date Foot Exam: Up to Date Pneumovax: Up To Date Influenza: refuses Aspirin: yes  HYPERTENSION / HYPERLIPIDEMIA Taking Atorvastatin  20 MG daily and Telmisartan  80 MG.  Satisfied with current treatment? yes Duration of hypertension: chronic BP monitoring frequency: occasional BP range: 120-130/70 range BP medication side effects: no Duration of hyperlipidemia: chronic Cholesterol medication side effects: no Cholesterol supplements: none Medication compliance: good compliance Aspirin: yes Recent stressors: no Recurrent headaches: no Visual changes: no Palpitations: no Dyspnea: no Chest pain: no Lower extremity edema: if sitting a lot notices they will swell, tries to elevate Dizzy/lightheaded: no The 10-year ASCVD risk score  (Arnett DK, et al., 2019) is: 17.2%   Values used to calculate the score:     Age: 28 years     Clincally relevant sex: Female     Is Non-Hispanic African American: Yes     Diabetic: Yes     Tobacco smoker: No     Systolic Blood Pressure: 128 mmHg     Is BP treated: Yes     HDL Cholesterol: 72 mg/dL     Total Cholesterol: 136 mg/dL   CHRONIC KIDNEY DISEASE (CKD 3a) Sees nephrology annually -- has visit upcoming. CKD status: stable Medications renally dose: yes Previous renal evaluation: yes Pneumovax:  Up To Date Influenza Vaccine:  Refuses    Latest Ref Rng & Units 05/16/2024    8:36 AM 02/13/2024    1:20 PM 10/30/2023   10:57 AM  BMP  Glucose 70 - 99 mg/dL 74  93  871   BUN 8 - 27 mg/dL 15  13  12    Creatinine 0.57 - 1.00 mg/dL 8.78  8.81  8.89   BUN/Creat Ratio 12 - 28 12  11  11    Sodium 134 - 144 mmol/L 142  145  144  Potassium 3.5 - 5.2 mmol/L 4.3  3.8  3.7   Chloride 96 - 106 mmol/L 106  108  107   CO2 20 - 29 mmol/L 21  24  22    Calcium  8.7 - 10.3 mg/dL 9.1  8.9  8.9    RHEUMATOID ARTHRITIS Saw rheumatology last 04/23/24. Takes Plaquenil . Duration: months Pain: no Context:  better Decreased function/range of motion: improved Erythema: no Swelling: no Heat or warmth: no Morning stiffness: stable Aggravating factors: none Alleviating factors: Plaquenil  Relief with NSAIDs?: No NSAIDs Taken Treatments attempted:  Parafin wax, Tylenol , Plaquenil , CBD cream Involved Joints:     Hands: yes bilateral    Wrists: none    Elbows: none    Shoulders: none    Back: yes     Hips: none    Ankles: none    Feet: none     08/16/2024    9:47 AM 05/16/2024    8:05 AM 02/13/2024    1:07 PM 11/28/2023   10:43 AM 10/30/2023   10:55 AM  Depression screen PHQ 2/9  Decreased Interest 0 0 0 0 0  Down, Depressed, Hopeless 0 0 0 0 0  PHQ - 2 Score 0 0 0 0 0  Altered sleeping 0 0 0 0 0  Tired, decreased energy 0 0 0 0 0  Change in appetite 0 0 0 0 0  Feeling bad or failure  about yourself  0 0 0 0 0  Trouble concentrating 0 0 0 0 0  Moving slowly or fidgety/restless 0 0 0 0 0  Suicidal thoughts 0 0 0 0 0  PHQ-9 Score 0 0 0 0 0  Difficult doing work/chores  Not difficult at all Not difficult at all Not difficult at all Not difficult at all       08/16/2024    9:46 AM 05/16/2024    8:05 AM 02/13/2024    1:07 PM 11/28/2023   10:43 AM  GAD 7 : Generalized Anxiety Score  Nervous, Anxious, on Edge 0 0 0 0  Control/stop worrying 0 0 0 0  Worry too much - different things 0 0 0 0  Trouble relaxing 0 0 0 0  Restless 0 0 0 0  Easily annoyed or irritable 0 0 0 0  Afraid - awful might happen 0 0 0 0  Total GAD 7 Score 0 0 0 0  Anxiety Difficulty  Not difficult at all Not difficult at all Not difficult at all   Relevant past medical, surgical, family and social history reviewed and updated as indicated. Interim medical history since our last visit reviewed. Allergies and medications reviewed and updated.  Review of Systems  Constitutional:  Negative for activity change, appetite change, diaphoresis, fatigue and fever.  Respiratory:  Negative for cough, chest tightness and shortness of breath.   Cardiovascular:  Negative for chest pain, palpitations and leg swelling.  Gastrointestinal: Negative.   Endocrine: Negative for cold intolerance, heat intolerance, polydipsia, polyphagia and polyuria.  Neurological: Negative.   Psychiatric/Behavioral: Negative.     Per HPI unless specifically indicated above     Objective:    BP 128/80 (BP Location: Left Arm, Patient Position: Sitting, Cuff Size: Normal)   Pulse 87   Ht 5' 4 (1.626 m)   Wt 216 lb 12.8 oz (98.3 kg)   LMP  (LMP Unknown)   SpO2 96%   BMI 37.21 kg/m   Wt Readings from Last 3 Encounters:  08/16/24 216 lb 12.8 oz (98.3 kg)  05/16/24 207 lb 9.6 oz (94.2 kg)  04/23/24 203 lb (92.1 kg)    Physical Exam Vitals and nursing note reviewed.  Constitutional:      General: She is awake. She is not in  acute distress.    Appearance: Normal appearance. She is well-developed and well-groomed. She is obese. She is not ill-appearing or toxic-appearing.  HENT:     Head: Normocephalic.     Right Ear: Hearing and external ear normal.     Left Ear: Hearing and external ear normal.  Eyes:     General: Lids are normal.        Right eye: No discharge.        Left eye: No discharge.     Conjunctiva/sclera: Conjunctivae normal.     Pupils: Pupils are equal, round, and reactive to light.  Neck:     Thyroid : No thyromegaly.     Vascular: No carotid bruit.  Cardiovascular:     Rate and Rhythm: Normal rate and regular rhythm.     Heart sounds: Normal heart sounds. No murmur heard.    No gallop.  Pulmonary:     Effort: Pulmonary effort is normal. No accessory muscle usage or respiratory distress.     Breath sounds: Normal breath sounds.  Abdominal:     General: Bowel sounds are normal. There is no distension.     Palpations: Abdomen is soft.     Tenderness: There is no abdominal tenderness.  Musculoskeletal:     Cervical back: Normal range of motion and neck supple.     Right lower leg: No edema.     Left lower leg: No edema.  Lymphadenopathy:     Cervical: No cervical adenopathy.  Skin:    General: Skin is warm and dry.  Neurological:     Mental Status: She is alert and oriented to person, place, and time.     Deep Tendon Reflexes: Reflexes are normal and symmetric.     Reflex Scores:      Brachioradialis reflexes are 2+ on the right side and 2+ on the left side.      Patellar reflexes are 2+ on the right side and 2+ on the left side. Psychiatric:        Attention and Perception: Attention normal.        Mood and Affect: Mood normal.        Speech: Speech normal.        Behavior: Behavior normal. Behavior is cooperative.        Thought Content: Thought content normal.    Diabetic Foot Exam - Simple   Simple Foot Form Visual Inspection See comments: Yes Sensation Testing Intact  to touch and monofilament testing bilaterally: Yes Pulse Check Posterior Tibialis and Dorsalis pulse intact bilaterally: Yes Comments Dry skin both feet     Results for orders placed or performed in visit on 05/16/24  Comprehensive metabolic panel with GFR   Collection Time: 05/16/24  8:36 AM  Result Value Ref Range   Glucose 74 70 - 99 mg/dL   BUN 15 8 - 27 mg/dL   Creatinine, Ser 8.78 (H) 0.57 - 1.00 mg/dL   eGFR 49 (L) >40 fO/fpw/8.26   BUN/Creatinine Ratio 12 12 - 28   Sodium 142 134 - 144 mmol/L   Potassium 4.3 3.5 - 5.2 mmol/L   Chloride 106 96 - 106 mmol/L   CO2 21 20 - 29 mmol/L   Calcium  9.1 8.7 - 10.3 mg/dL   Total  Protein 6.3 6.0 - 8.5 g/dL   Albumin 3.9 3.9 - 4.9 g/dL   Globulin, Total 2.4 1.5 - 4.5 g/dL   Bilirubin Total 0.4 0.0 - 1.2 mg/dL   Alkaline Phosphatase 62 44 - 121 IU/L   AST 25 0 - 40 IU/L   ALT 21 0 - 32 IU/L  Lipid Panel w/o Chol/HDL Ratio   Collection Time: 05/16/24  8:36 AM  Result Value Ref Range   Cholesterol, Total 136 100 - 199 mg/dL   Triglycerides 63 0 - 149 mg/dL   HDL 72 >60 mg/dL   VLDL Cholesterol Cal 13 5 - 40 mg/dL   LDL Chol Calc (NIH) 51 0 - 99 mg/dL  Bayer DCA Hb J8r Waived   Collection Time: 05/16/24  9:18 AM  Result Value Ref Range   HB A1C (BAYER DCA - WAIVED) 5.5 4.8 - 5.6 %      Assessment & Plan:   Problem List Items Addressed This Visit       Cardiovascular and Mediastinum   Hypertension associated with diabetes (HCC)   Chronic, stable.  BP at goal today. Recommend she check BP at home three mornings a week and document for provider.  Continue Telmisartan  80 MG daily, discussed with her today.  LABS: CMP.  Educated her on Telmisartan  and kidney benefit.  Urine micro alb 80 March 2025.  Focus on DASH diet at home.        Relevant Medications   telmisartan  (MICARDIS ) 80 MG tablet   atorvastatin  (LIPITOR) 20 MG tablet   tirzepatide  (MOUNJARO ) 5 MG/0.5ML Pen   Other Relevant Orders   Bayer DCA Hb A1c Waived      Endocrine   Type 2 diabetes mellitus with diabetic nephropathy (HCC) - Primary   Chronic, ongoing.  A1c 5.1% today, remaining stable but is gaining weight.  Urine ALB 80 (March 2025, continue Telmisartan  for kidney protection.  Will see if can get Mounjaro  5 MG weekly covered and if so adjust as needed, this may offer more benefit to weight loss with her diabetes.  Discussed with her at length.  If unable to get covered will continue Ozempic . She is to notify provider if levels on BS get <70 consistently.  Continue off Jardiance  and restart as needed. Continue increased diet focus and monitoring BS twice a day.  Recommend focus on healthy weight loss and exercise daily.  Have recommended if any consistent lows <70 immediately alert provider so can adjust medication.  Return in 3 months. - On ARB and Statin - Foot and eye exam up to date - Vaccinations, refuses flu vaccine.      Relevant Medications   telmisartan  (MICARDIS ) 80 MG tablet   atorvastatin  (LIPITOR) 20 MG tablet   tirzepatide  (MOUNJARO ) 5 MG/0.5ML Pen   Other Relevant Orders   Bayer DCA Hb A1c Waived   Comprehensive metabolic panel with GFR   Hyperlipidemia associated with type 2 diabetes mellitus (HCC)   Chronic, ongoing.  Continue current medication regimen and adjust as needed.  Lipid panel today.      Relevant Medications   telmisartan  (MICARDIS ) 80 MG tablet   atorvastatin  (LIPITOR) 20 MG tablet   tirzepatide  (MOUNJARO ) 5 MG/0.5ML Pen   Other Relevant Orders   Bayer DCA Hb A1c Waived   Comprehensive metabolic panel with GFR   Lipid Panel w/o Chol/HDL Ratio   Diabetes mellitus treated with injections of non-insulin  medication (HCC)   Refer to diabetes with nephropathy plan of care.  Relevant Medications   telmisartan  (MICARDIS ) 80 MG tablet   atorvastatin  (LIPITOR) 20 MG tablet   tirzepatide  (MOUNJARO ) 5 MG/0.5ML Pen   Other Relevant Orders   Bayer DCA Hb A1c Waived   CKD stage 3 due to type 2 diabetes mellitus  (HCC)   Chronic, ongoing CKD 3a with stable levels.  Continue Telmisartan  for kidney protection.  LABS: CMP.  Continue collaboration with nephrology, appreciate their input.  Recent note reviewed.       Relevant Medications   telmisartan  (MICARDIS ) 80 MG tablet   atorvastatin  (LIPITOR) 20 MG tablet   tirzepatide  (MOUNJARO ) 5 MG/0.5ML Pen   Other Relevant Orders   Bayer DCA Hb A1c Waived     Musculoskeletal and Integument   Seropositive rheumatoid arthritis (HCC)   Ongoing and stable.  Diagnosed on 01/20/22, continue collaboration with rheumatology and Plaquenil  as ordered by them which offered some improvement.  Recommend massage therapy added to regimen and continue to visit with chiropractor.        Other   Obesity   BMI 37.21 with T2DM, HTN/HLD, some gain presenting.  Recommended eating smaller high protein, low fat meals more frequently and exercising 30 mins a day 5 times a week with a goal of 10-15lb weight loss in the next 3 months. Patient voiced their understanding and motivation to adhere to these recommendations.       Relevant Medications   tirzepatide  (MOUNJARO ) 5 MG/0.5ML Pen   Long-term use of hydroxychloroquine    Continue collaboration with rheumatology, labs and recent note reviewed.  Labs today.        Follow up plan: Return in about 3 months (around 11/15/2024) for T2DM, HTN/HLD, RA.

## 2024-08-16 NOTE — Assessment & Plan Note (Signed)
 Chronic, stable.  BP at goal today. Recommend she check BP at home three mornings a week and document for provider.  Continue Telmisartan  80 MG daily, discussed with her today.  LABS: CMP.  Educated her on Telmisartan  and kidney benefit.  Urine micro alb 80 March 2025.  Focus on DASH diet at home.

## 2024-08-16 NOTE — Assessment & Plan Note (Signed)
 Continue collaboration with rheumatology, labs and recent note reviewed.  Labs today.

## 2024-08-16 NOTE — Assessment & Plan Note (Signed)
 Ongoing and stable.  Diagnosed on 01/20/22, continue collaboration with rheumatology and Plaquenil  as ordered by them which offered some improvement.  Recommend massage therapy added to regimen and continue to visit with chiropractor.

## 2024-08-16 NOTE — Assessment & Plan Note (Signed)
 Refer to diabetes with nephropathy plan of care.

## 2024-08-16 NOTE — Assessment & Plan Note (Signed)
 Chronic, ongoing.  Continue current medication regimen and adjust as needed. Lipid panel today.

## 2024-08-16 NOTE — Assessment & Plan Note (Signed)
 BMI 37.21 with T2DM, HTN/HLD, some gain presenting.  Recommended eating smaller high protein, low fat meals more frequently and exercising 30 mins a day 5 times a week with a goal of 10-15lb weight loss in the next 3 months. Patient voiced their understanding and motivation to adhere to these recommendations.

## 2024-08-16 NOTE — Assessment & Plan Note (Signed)
Chronic, ongoing CKD 3a with stable levels.  Continue Telmisartan for kidney protection.  LABS: CMP.  Continue collaboration with nephrology, appreciate their input.  Recent note reviewed.

## 2024-08-17 ENCOUNTER — Ambulatory Visit: Payer: Self-pay | Admitting: Nurse Practitioner

## 2024-08-17 LAB — LIPID PANEL W/O CHOL/HDL RATIO
Cholesterol, Total: 135 mg/dL (ref 100–199)
HDL: 74 mg/dL (ref >39)
LDL Chol Calc (NIH): 46 mg/dL (ref 0–99)
Triglycerides: 75 mg/dL (ref 0–149)
VLDL Cholesterol Cal: 15 mg/dL (ref 5–40)

## 2024-08-17 LAB — COMPREHENSIVE METABOLIC PANEL WITH GFR
ALT: 20 IU/L (ref 0–32)
AST: 38 IU/L (ref 0–40)
Albumin: 4 g/dL (ref 3.9–4.9)
Alkaline Phosphatase: 76 IU/L (ref 49–135)
BUN/Creatinine Ratio: 10 — ABNORMAL LOW (ref 12–28)
BUN: 12 mg/dL (ref 8–27)
Bilirubin Total: 0.4 mg/dL (ref 0.0–1.2)
CO2: 19 mmol/L — ABNORMAL LOW (ref 20–29)
Calcium: 9.1 mg/dL (ref 8.7–10.3)
Chloride: 106 mmol/L (ref 96–106)
Creatinine, Ser: 1.24 mg/dL — ABNORMAL HIGH (ref 0.57–1.00)
Globulin, Total: 2.5 g/dL (ref 1.5–4.5)
Glucose: 112 mg/dL — ABNORMAL HIGH (ref 70–99)
Potassium: 5.2 mmol/L (ref 3.5–5.2)
Sodium: 140 mmol/L (ref 134–144)
Total Protein: 6.5 g/dL (ref 6.0–8.5)
eGFR: 47 mL/min/1.73 — ABNORMAL LOW (ref >59)

## 2024-08-17 NOTE — Progress Notes (Signed)
 Contacted via MyChart  Good evening Kim Thomas, your labs have returned: - Kidney function, creatinine and eGFR, continues to show Stage 3a kidney disease. Liver function, AST and ALT, is normal. - Lipid panel continues to show levels at goal.  Any questions? Keep being amazing!!  Thank you for allowing me to participate in your care.  I appreciate you. Kindest regards, Chimaobi Casebolt

## 2024-08-20 DIAGNOSIS — N1831 Chronic kidney disease, stage 3a: Secondary | ICD-10-CM | POA: Diagnosis not present

## 2024-08-20 DIAGNOSIS — I10 Essential (primary) hypertension: Secondary | ICD-10-CM | POA: Diagnosis not present

## 2024-08-20 DIAGNOSIS — E8722 Chronic metabolic acidosis: Secondary | ICD-10-CM | POA: Diagnosis not present

## 2024-08-20 DIAGNOSIS — E1122 Type 2 diabetes mellitus with diabetic chronic kidney disease: Secondary | ICD-10-CM | POA: Diagnosis not present

## 2024-08-20 DIAGNOSIS — R600 Localized edema: Secondary | ICD-10-CM | POA: Diagnosis not present

## 2024-08-26 ENCOUNTER — Telehealth: Payer: Self-pay | Admitting: Nurse Practitioner

## 2024-08-26 NOTE — Telephone Encounter (Signed)
 Copied from CRM [id number]. Topic: Medicare AWV >> Aug 26, 2024  1:29 PM Kim Thomas DEL wrote: Reason for CRM: LVM 08/26/2024 reminder call for AWV appt on Tuesday 08/27/2024 to confirm awv  Kim Thomas Paschal; Care Guide Ambulatory Clinical Support [place] [facility] Direct Dial: [phone]

## 2024-08-27 ENCOUNTER — Ambulatory Visit: Payer: Self-pay

## 2024-08-27 VITALS — Ht 64.0 in | Wt 216.0 lb

## 2024-08-27 DIAGNOSIS — H04123 Dry eye syndrome of bilateral lacrimal glands: Secondary | ICD-10-CM | POA: Diagnosis not present

## 2024-08-27 DIAGNOSIS — Z780 Asymptomatic menopausal state: Secondary | ICD-10-CM | POA: Diagnosis not present

## 2024-08-27 DIAGNOSIS — Z1231 Encounter for screening mammogram for malignant neoplasm of breast: Secondary | ICD-10-CM

## 2024-08-27 DIAGNOSIS — E119 Type 2 diabetes mellitus without complications: Secondary | ICD-10-CM | POA: Diagnosis not present

## 2024-08-27 DIAGNOSIS — Z0000 Encounter for general adult medical examination without abnormal findings: Secondary | ICD-10-CM

## 2024-08-27 DIAGNOSIS — Z79899 Other long term (current) drug therapy: Secondary | ICD-10-CM | POA: Diagnosis not present

## 2024-08-27 DIAGNOSIS — H40013 Open angle with borderline findings, low risk, bilateral: Secondary | ICD-10-CM | POA: Diagnosis not present

## 2024-08-27 LAB — OPHTHALMOLOGY REPORT-SCANNED

## 2024-08-27 NOTE — Patient Instructions (Signed)
 [name],  Thank you for taking the time for your Medicare Wellness Visit. I appreciate your continued commitment to your health goals. Please review the care plan we discussed, and feel free to reach out if I can assist you further.  Medicare recommends these wellness visits once per year to help you and your care team stay ahead of potential health issues. These visits are designed to focus on prevention, allowing your provider to concentrate on managing your acute and chronic conditions during your regular appointments.  Please note that Annual Wellness Visits do not include a physical exam. Some assessments may be limited, especially if the visit was conducted virtually. If needed, we may recommend a separate in-person follow-up with your provider.  Ongoing Care Seeing your primary care provider every 3 to 6 months helps us  monitor your health and provide consistent, personalized care.   Referrals If a referral was made during today's visit and you haven't received any updates within two weeks, please contact the referred provider directly to check on the status.  Recommended Screenings:  Please call to schedule your mammogram (due 06/01/25) and bone density scan (due 01/28/25):   You may wait and have your bone density at the same time as your mammogram.  [facility] at [facility] Address: [address], [place], [place] Phone: [phone]  [facility] at [facility] [address] [place], [place] [address] Phone: [phone]   Health Maintenance  Topic Date Due   COVID-19 Vaccine (3 - Pfizer risk series) 08/27/2024*   Flu Shot  02/25/2025*   DEXA scan (bone density measurement)  01/27/2025   Eye exam for diabetics  02/11/2025   Yearly kidney health urinalysis for diabetes  02/12/2025   Hemoglobin A1C  02/13/2025   DTaP/Tdap/Td vaccine (2 - Tdap) 04/21/2025   Breast Cancer Screening  05/30/2025   Yearly kidney function  blood test for diabetes  08/16/2025   Complete foot exam   08/16/2025   Medicare Annual Wellness Visit  08/27/2025   Colon Cancer Screening  03/06/2034   Pneumococcal Vaccine for age over 27  Completed   Hepatitis C Screening  Completed   Zoster (Shingles) Vaccine  Completed   HPV Vaccine  Aged Out   Meningitis B Vaccine  Aged Out  *Topic was postponed. The date shown is not the original due date.       08/27/2024   11:01 AM  Advanced Directives  Does Patient Have a Medical Advance Directive? Yes  Type of Estate agent of [place];Living will  Does patient want to make changes to medical advance directive? No - Patient declined  Copy of Healthcare Power of Attorney in Chart? No - copy requested   Advance Care Planning is important because it: Ensures you receive medical care that aligns with your values, goals, and preferences. Provides guidance to your family and loved ones, reducing the emotional burden of decision-making during critical moments.  Vision: Annual vision screenings are recommended for early detection of glaucoma, cataracts, and diabetic retinopathy. These exams can also reveal signs of chronic conditions such as diabetes and high blood pressure.  Dental: Annual dental screenings help detect early signs of oral cancer, gum disease, and other conditions linked to overall health, including heart disease and diabetes.  Please see the attached documents for additional preventive care recommendations.   Fall Prevention in the Home, Adult Falls can cause injuries and affect people of all ages. There are many simple things  that you can do to make your home safe and to help prevent falls. If you need it, ask for help making these changes. What actions can I take to prevent falls? General information Use good lighting in all rooms. Make sure to: Replace any light bulbs that burn out. Turn on lights if it is dark and use night-lights. Keep items that  you use often in easy-to-reach places. Lower the shelves around your home if needed. Move furniture so that there are clear paths around it. Do not keep throw rugs or other things on the floor that can make you trip. If any of your floors are uneven, fix them. Add color or contrast paint or tape to clearly mark and help you see: Grab bars or handrails. First and last steps of staircases. Where the edge of each step is. If you use a ladder or stepladder: Make sure that it is fully opened. Do not climb a closed ladder. Make sure the sides of the ladder are locked in place. Have someone hold the ladder while you use it. Know where your pets are as you move through your home. What can I do in the bathroom?     Keep the floor dry. Clean up any water that is on the floor right away. Remove soap buildup in the bathtub or shower. Buildup makes bathtubs and showers slippery. Use non-skid mats or decals on the floor of the bathtub or shower. Attach bath mats securely with double-sided, non-slip rug tape. If you need to sit down while you are in the shower, use a non-slip stool. Install grab bars by the toilet and in the bathtub and shower. Do not use towel bars as grab bars. What can I do in the bedroom? Make sure that you have a light by your bed that is easy to reach. Do not use any sheets or blankets on your bed that hang to the floor. Have a firm bench or chair with side arms that you can use for support when you get dressed. What can I do in the kitchen? Clean up any spills right away. If you need to reach something above you, use a sturdy step stool that has a grab bar. Keep electrical cables out of the way. Do not use floor polish or wax that makes floors slippery. What can I do with my stairs? Do not leave anything on the stairs. Make sure that you have a light switch at the top and the bottom of the stairs. Have them installed if you do not have them. Make sure that there are  handrails on both sides of the stairs. Fix handrails that are broken or loose. Make sure that handrails are as long as the staircases. Install non-slip stair treads on all stairs in your home if they do not have carpet. Avoid having throw rugs at the top or bottom of stairs, or secure the rugs with carpet tape to prevent them from moving. Choose a carpet design that does not hide the edge of steps on the stairs. Make sure that carpet is firmly attached to the stairs. Fix any carpet that is loose or worn. What can I do on the outside of my home? Use bright outdoor lighting. Repair the edges of walkways and driveways and fix any cracks. Clear paths of anything that can make you trip, such as tools or rocks. Add color or contrast paint or tape to clearly mark and help you see high doorway thresholds. Trim any bushes or  trees on the main path into your home. Check that handrails are securely fastened and in good repair. Both sides of all steps should have handrails. Install guardrails along the edges of any raised decks or porches. Have leaves, snow, and ice cleared regularly. Use sand, salt, or ice melt on walkways during winter months if you live where there is ice and snow. In the garage, clean up any spills right away, including grease or oil spills. What other actions can I take? Review your medicines with your health care provider. Some medicines can make you confused or feel dizzy. This can increase your chance of falling. Wear closed-toe shoes that fit well and support your feet. Wear shoes that have rubber soles and low heels. Use a cane, walker, scooter, or crutches that help you move around if needed. Talk with your provider about other ways that you can decrease your risk of falls. This may include seeing a physical therapist to learn to do exercises to improve movement and strength. Where to find more information Centers for Disease Control and Prevention, STEADI: TonerPromos.no [employer] on Aging: BaseRingTones.pl National Institute on Aging: BaseRingTones.pl Contact a health care provider if: You are afraid of falling at home. You feel weak, drowsy, or dizzy at home. You fall at home. Get help right away if you: Lose consciousness or have trouble moving after a fall. Have a fall that causes a head injury. These symptoms may be an emergency. Get help right away. Call 911. Do not wait to see if the symptoms will go away. Do not drive yourself to the hospital. This information is not intended to replace advice given to you by your health care provider. Make sure you discuss any questions you have with your health care provider. Document Revised: 07/18/2022 Document Reviewed: 07/18/2022 Elsevier Patient Education  2024 [employer].

## 2024-08-27 NOTE — Progress Notes (Signed)
 Subjective:   Kim Thomas is a 68 y.o. who presents for a Medicare Wellness preventive visit.  As a reminder, Annual Wellness Visits don't include a physical exam, and some assessments may be limited, especially if this visit is performed virtually. We may recommend an in-person follow-up visit with your provider if needed.  Visit Complete: Virtual I connected with  Kim Thomas on 08/27/24 by a audio enabled telemedicine application and verified that I am speaking with the correct person using two identifiers.  Patient Location: Home  Provider Location: Office/Clinic  I discussed the limitations of evaluation and management by telemedicine. The patient expressed understanding and agreed to proceed.  Vital Signs: Because this visit was a virtual/telehealth visit, some criteria may be missing or patient reported. Any vitals not documented were not able to be obtained and vitals that have been documented are patient reported.  VideoDeclined- This patient declined Librarian, academic. Therefore the visit was completed with audio only.  Persons Participating in Visit: Patient.  AWV Questionnaire: Yes: Patient Medicare AWV questionnaire was completed by the patient on 08/24/24; I have confirmed that all information answered by patient is correct and no changes since this date.  Cardiac Risk Factors include: advanced age (>48men, >16 women);diabetes mellitus;hypertension;dyslipidemia;obesity (BMI >30kg/m2)     Objective:    Today's Vitals   08/27/24 1044  Weight: 216 lb (98 kg)  Height: 5' 4 (1.626 m)   Body mass index is 37.08 kg/m.     08/27/2024   11:01 AM 03/06/2024   10:22 AM 08/22/2023   11:34 AM 08/16/2021   10:32 AM 08/14/2020   10:45 AM 08/14/2019    9:57 AM 01/03/2019    9:08 AM  Advanced Directives  Does Patient Have a Medical Advance Directive? Yes Yes Yes Yes Yes Yes Yes   Type of Estate agent of [place];Living will  Healthcare Power of [place];Living will Healthcare Power of [place];Living will Healthcare Power of [place];Living will Healthcare Power of [place];Living will Living will;Healthcare Power of [employer] Power of [place];Living will  Does patient want to make changes to medical advance directive? No - Patient declined  No - Patient declined      Copy of Healthcare Power of Attorney in Chart? No - copy requested  No - copy requested No - copy requested No - copy requested No - copy requested      Data saved with a previous flowsheet row definition    Current Medications (verified) Outpatient Encounter Medications as of 08/27/2024  Medication Sig   Accu-Chek Softclix Lancets lancets 1 each by Other route 2 (two) times daily. Use as instructed   acetaminophen  (TYLENOL ) 500 MG tablet Take 500 mg by mouth daily as needed.   ascorbic acid (VITAMIN C) 1000 MG tablet Take by mouth.   Aspirin (VAZALORE) 81 MG CAPS Take by mouth.   atorvastatin  (LIPITOR) 20 MG tablet Take 1 tablet (20 mg total) by mouth daily.   Blood Glucose Monitoring Suppl (ACCU-CHEK GUIDE ME) w/Device KIT 1 each by Does not apply route 2 (two) times daily.   CALCIUM  CITRATE PO Take 1,000 mg by mouth daily.   Cholecalciferol (VITAMIN D3) 2000 units TABS Take 2,000 Units by mouth daily.   Co-Enzyme Q-10 100 MG CAPS Take 50 mg by mouth daily.   EQ ALLERGY RELIEF, CETIRIZINE , 10 MG tablet Take 1 tablet by mouth once daily   fluticasone  (FLONASE ) 50 MCG/ACT nasal spray USE 1 TO 2 SPRAYS IN BOTH  NOSTRILS  TWICE DAILY   gabapentin  (NEURONTIN ) 400 MG capsule Take 1 capsule (400 mg total) by mouth 3 (three) times daily.   glucose blood (ACCU-CHEK GUIDE TEST) test strip 1 each by Other route 2 (two) times daily. Use as instructed   hydroxychloroquine  (PLAQUENIL ) 200 MG tablet TAKE 1 AND 1/2 TABLETS BY MOUTH  DAILY   Magnesium 400 MG TABS Take by mouth.   Omega-3 1000 MG CAPS Take 1 capsule by mouth daily.   omeprazole   (PRILOSEC) 20 MG capsule Take 1 capsule (20 mg total) by mouth daily.   Potassium 99 MG TABS Take by mouth.   sodium bicarbonate 650 MG tablet Take 650 mg by mouth 2 (two) times daily.   telmisartan  (MICARDIS ) 80 MG tablet Take 1 tablet (80 mg total) by mouth daily.   tirzepatide  (MOUNJARO ) 5 MG/0.5ML Pen Inject 5 mg into the skin once a week.   triamcinolone  cream (KENALOG ) 0.1 % Apply 1 Application topically 2 (two) times daily. (Patient taking differently: Apply 1 Application topically 2 (two) times daily. PRN)   vitamin E 100 UNIT capsule Take 100 Units by mouth daily.   ALPHA LIPOIC ACID PO Take 1 tablet by mouth daily. (Patient not taking: Reported on 08/27/2024)   No facility-administered encounter medications on file as of 08/27/2024.    Allergies (verified) Morphine  and codeine, Erythromycin, and Hydrocodone -acetaminophen    History: Past Medical History:  Diagnosis Date   Allergy    Anxiety    Chronic kidney disease    DDD (degenerative disc disease), cervical    Diabetes mellitus without complication (HCC)    GERD (gastroesophageal reflux disease)    Gout    Hypertension    Oxygen deficiency    Seropositive rheumatoid arthritis (HCC)    Sleep apnea    Past Surgical History:  Procedure Laterality Date   BREAST BIOPSY Left    benign   BREAST CYST EXCISION Left    c section     x2   CESAREAN SECTION     CHOLECYSTECTOMY     COLONOSCOPY N/A 03/06/2024   Procedure: COLONOSCOPY;  Surgeon: Therisa Bi, MD;  Location[facility] ENDOSCOPY;  Service: Endoscopy;  Laterality: N/A;   EYE SURGERY Bilateral 2019   catracts Dr.Hecter in [place]    FOOT SURGERY Bilateral    KNEE ARTHROSCOPY Left 07/18/2016   Procedure: ARTHROSCOPY KNEE;  Surgeon: Lynwood SHAUNNA Hue, MD;  Location: [facility] ORS;  Service: Orthopedics;  Laterality: Left;   KNEE ARTHROSCOPY WITH LATERAL MENISECTOMY Left 07/18/2016   Procedure: KNEE ARTHROSCOPY WITH PARTIAL  LATERAL MENISECTOMY, CHONDROPLASTY LATERAL FEMORAL  CONDYLE;  Surgeon: Lynwood SHAUNNA Hue, MD;  Location: [facility] ORS;  Service: Orthopedics;  Laterality: Left;   POLYPECTOMY  03/06/2024   Procedure: POLYPECTOMY, INTESTINE;  Surgeon: Therisa Bi, MD;  Location[facility] ENDOSCOPY;  Service: Endoscopy;;   SPINE SURGERY  11/09/2011   C5   TUBAL LIGATION     Family History  Problem Relation Age of Onset   Cancer Mother        breast   Hypertension Mother    Hyperlipidemia Mother    Diabetes Mother    Glaucoma Mother    Cataracts Mother    Breast cancer Mother 85   Arthritis Mother    Cancer Father    Heart disease Sister    Glaucoma Maternal Aunt    Breast cancer Maternal Aunt    Cancer Maternal Grandfather    Breast cancer Cousin    Alcohol abuse Neg Hx    Social  History   Socioeconomic History   Marital status: Single    Spouse name: Not on file   Number of children: 2   Years of education: Not on file   Highest education level: Bachelor's degree (e.g., BA, AB, BS)  Occupational History   Occupation: retired    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  Tobacco Use   Smoking status: Former    Current packs/day: 0.00    Average packs/day: 0.2 packs/day for 39.5 years (9.9 ttl pk-yrs)    Types: Cigarettes    Start date: 80    Quit date: 05/21/2011    Years since quitting: 13.2    Passive exposure: Never   Smokeless tobacco: Never  Vaping Use   Vaping status: Never Used  Substance and Sexual Activity   Alcohol use: No   Drug use: No   Sexual activity: Not Currently  Other Topics Concern   Not on file  Social History Narrative   Never been married, 2 children   Social Drivers of Corporate investment banker Strain: Low Risk  (08/27/2024)   Overall Financial Resource Strain (CARDIA)    Difficulty of Paying Living Expenses: Not hard at all  Food Insecurity: No Food Insecurity (08/27/2024)   Hunger Vital Sign    Worried About Running Out of Food in the Last Year: Never true    Ran Out of Food in the Last Year: Never  true  Transportation Needs: No Transportation Needs (08/27/2024)   PRAPARE - Administrator, Civil Service (Medical): No    Lack of Transportation (Non-Medical): No  Physical Activity: Sufficiently Active (08/27/2024)   Exercise Vital Sign    Days of Exercise per Week: 3 days    Minutes of Exercise per Session: 90 min  Stress: No Stress Concern Present (08/27/2024)   [employer] of Occupational Health - Occupational Stress Questionnaire    Feeling of Stress: Only a little  Social Connections: Moderately Integrated (08/27/2024)   Social Connection and Isolation Panel    Frequency of Communication with Friends and Family: More than three times a week    Frequency of Social Gatherings with Friends and Family: More than three times a week    Attends Religious Services: More than 4 times per year    Active Member of [employer] or Organizations: Yes    Attends Engineer, structural: More than 4 times per year    Marital Status: Never married    Tobacco Counseling Counseling given: Not Answered    Clinical Intake:  Pre-visit preparation completed: Yes  Pain : No/denies pain     BMI - recorded: 37.08 Nutritional Status: BMI > 30  Obese Nutritional Risks: None Diabetes: Yes CBG done?: No Did pt. bring in CBG monitor from home?: No  Lab Results  Component Value Date   HGBA1C 5.1 08/16/2024   HGBA1C 5.5 05/16/2024   HGBA1C 5.5 02/13/2024     How often do you need to have someone help you when you read instructions, pamphlets, or other written materials from your doctor or pharmacy?: 1 - Never  Interpreter Needed?: No  Information entered by :: Vina Ned, CMA   Activities of Daily Living     08/27/2024   10:49 AM 08/24/2024   12:26 PM  In your present state of health, do you have any difficulty performing the following activities:  Hearing? 0 0  Vision? 0 0  Difficulty concentrating or making decisions? 0 0  Walking or climbing stairs? 1  0   Comment uses cane or rollator as needed   Dressing or bathing? 0 0  Doing errands, shopping? 0 0  Preparing Food and eating ? N N  Using the Toilet? N N  In the past six months, have you accidently leaked urine? N N  Do you have problems with loss of bowel control? N N  Managing your Medications? N N  Managing your Finances? N N  Housekeeping or managing your Housekeeping? N N    Patient Care Team: Cannady, Jolene T, NP as PCP - General (Nurse Practitioner) Dennise Capri, MD (Nephrology) Jeannetta Lonni ORN, MD as Consulting Physician (Rheumatology) Tobie Franky SQUIBB, DPM as Consulting Physician (Podiatry) Vivian, [place], OD (Optometry)  I have updated your Care Teams any recent Medical Services you may have received from other providers in the past year.     Assessment:   This is a routine wellness examination for [facility].  Hearing/Vision screen Hearing Screening - Comments:: Denies hearing loss  Vision Screening - Comments:: Gets DM eye exams, Dr. [place] Frazier, [facility] [place]   Goals Addressed               This Visit's Progress     Weight (lb) < 170 lb (77.1 kg) (pt-stated)   216 lb (98 kg)      Depression Screen     08/27/2024   10:59 AM 08/16/2024    9:47 AM 05/16/2024    8:05 AM 02/13/2024    1:07 PM 11/28/2023   10:43 AM 10/30/2023   10:55 AM 08/22/2023   11:31 AM  PHQ 2/9 Scores  PHQ - 2 Score 0 0 0 0 0 0 0  PHQ- 9 Score 0 0 0 0 0 0 0  Exception Documentation  Patient refusal         Fall Risk     08/27/2024   11:05 AM 08/24/2024   12:26 PM 05/16/2024    8:05 AM 02/13/2024    1:07 PM 11/28/2023   10:43 AM  Fall Risk   Falls in the past year? 0 1 0 0 0  Number falls in past yr: 0 0 0 0 0  Injury with Fall? 0 0 0 0 0  Risk for fall due to : History of fall(s);Impaired balance/gait;Orthopedic patient  No Fall Risks No Fall Risks No Fall Risks  Follow up Falls evaluation completed;Education provided  Falls evaluation completed Falls evaluation completed  Falls evaluation completed    MEDICARE RISK AT HOME:  Medicare Risk at Home Any stairs in or around the home?: No If so, are there any without handrails?: No Home free of loose throw rugs in walkways, pet beds, electrical cords, etc?: Yes Adequate lighting in your home to reduce risk of falls?: Yes Life alert?: No Use of a cane, walker or w/c?: Yes (uses cane or walker if needed) Grab bars in the bathroom?: No Shower chair or bench in shower?: Yes Elevated toilet seat or a handicapped toilet?: No  TIMED UP AND GO:  Was the test performed?  No  Cognitive Function: 6CIT completed        08/27/2024   11:06 AM 08/22/2023   11:37 AM 08/18/2022   10:39 AM 08/16/2021   10:36 AM 08/14/2020   10:52 AM  6CIT Screen  What Year? 0 points 0 points 0 points 0 points 0 points  What month? 0 points 0 points 0 points 0 points 0 points  What time? 0 points 0 points 0 points 0 points  0 points  Count back from 20 0 points 0 points 0 points 0 points 0 points  Months in reverse 0 points 2 points 0 points 0 points 0 points  Repeat phrase 0 points 0 points 0 points 0 points 0 points  Total Score 0 points 2 points 0 points 0 points 0 points    Immunizations Immunization History  Administered Date(s) Administered   PFIZER(Purple Top)SARS-COV-2 Vaccination 03/28/2020, 04/18/2020   PNEUMOCOCCAL CONJUGATE-20 09/20/2022   Pneumococcal Conjugate-13 08/09/2021   Td 04/22/2015   Zoster Recombinant(Shingrix) 02/23/2017, 06/16/2017    Screening Tests Health Maintenance  Topic Date Due   COVID-19 Vaccine (3 - Pfizer risk series) 08/27/2024 (Originally 05/16/2020)   Influenza Vaccine  02/25/2025 (Originally 06/28/2024)   DEXA SCAN  01/27/2025   OPHTHALMOLOGY EXAM  02/11/2025   Diabetic kidney evaluation - Urine ACR  02/12/2025   HEMOGLOBIN A1C  02/13/2025   DTaP/Tdap/Td (2 - Tdap) 04/21/2025   Mammogram  05/30/2025   Diabetic kidney evaluation - eGFR measurement  08/16/2025   FOOT EXAM  08/16/2025    Medicare Annual Wellness (AWV)  08/27/2025   Colonoscopy  03/06/2034   Pneumococcal Vaccine: 50+ Years  Completed   Hepatitis C Screening  Completed   Zoster Vaccines- Shingrix  Completed   HPV VACCINES  Aged Out   Meningococcal B Vaccine  Aged Out    Health Maintenance Items Addressed: Mammogram ordered, DEXA ordered, See Nurse Notes at the end of this note  Additional Screening:  Vision Screening: Recommended annual ophthalmology exams for early detection of glaucoma and other disorders of the eye. Is the patient up to date with their annual eye exam?  Yes  Who is the provider or what is the name of the office in which the patient attends annual eye exams? Dr. [place] Frazier, [place]  Dental Screening: Recommended annual dental exams for proper oral hygiene  Community Resource Referral / Chronic Care Management: CRR required this visit?  No   CCM required this visit?  No   Plan:    I have personally reviewed and noted the following in the patient's chart:   Medical and social history Use of alcohol, tobacco or illicit drugs  Current medications and supplements including opioid prescriptions. Patient is not currently taking opioid prescriptions. Functional ability and status Nutritional status Physical activity Advanced directives List of other physicians Hospitalizations, surgeries, and ER visits in previous 12 months Vitals Screenings to include cognitive, depression, and falls Referrals and appointments  In addition, I have reviewed and discussed with patient certain preventive protocols, quality metrics, and best practice recommendations. A written personalized care plan for preventive services as well as general preventive health recommendations were provided to patient.   Vina Ned, CMA   08/27/2024   After Visit Summary: (MyChart) Due to this being a telephonic visit, the after visit summary with patients personalized plan was offered to patient via  MyChart   Notes:  Placed orders for MMG (due ~ 06/01/25) and DEXA scan (due ~ 01/28/25). Patient is going to wait and have DEXA at the same time she has a MMG. Declined DM & Nutrition education referral Declined flu and covid vaccines Screening colonoscopy no longer recommended by GI due to age

## 2024-09-17 DIAGNOSIS — Z79899 Other long term (current) drug therapy: Secondary | ICD-10-CM | POA: Diagnosis not present

## 2024-09-17 DIAGNOSIS — H40013 Open angle with borderline findings, low risk, bilateral: Secondary | ICD-10-CM | POA: Diagnosis not present

## 2024-09-17 DIAGNOSIS — E119 Type 2 diabetes mellitus without complications: Secondary | ICD-10-CM | POA: Diagnosis not present

## 2024-09-17 LAB — OPHTHALMOLOGY REPORT-SCANNED

## 2024-09-28 ENCOUNTER — Other Ambulatory Visit: Payer: Self-pay | Admitting: Internal Medicine

## 2024-09-28 DIAGNOSIS — R7681 Abnormal rheumatoid factor and anti-citrullinated protein antibody without rheumatoid arthritis: Secondary | ICD-10-CM

## 2024-09-28 DIAGNOSIS — M059 Rheumatoid arthritis with rheumatoid factor, unspecified: Secondary | ICD-10-CM

## 2024-09-28 DIAGNOSIS — M79641 Pain in right hand: Secondary | ICD-10-CM

## 2024-09-30 NOTE — Telephone Encounter (Signed)
 Last Fill: 06/28/2024  Eye exam: 08/27/2024 WNL   Labs: 08/16/2024 Glucose 112, Creat. 1.24, GFR 47, BUN/Creat. Ratio 10, CO2 19  Next Visit: 10/22/2024  Last Visit: 04/23/2024  IK:Dzmnendpupcz rheumatoid arthritis   Current Dose per office note 04/23/2024: HCQ 300 mg daily   Okay to refill Plaquenil ?
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
## 2024-10-09 ENCOUNTER — Ambulatory Visit: Admitting: Podiatry

## 2024-10-22 ENCOUNTER — Ambulatory Visit: Attending: Internal Medicine | Admitting: Internal Medicine

## 2024-10-22 ENCOUNTER — Encounter: Payer: Self-pay | Admitting: Internal Medicine

## 2024-10-22 VITALS — BP 144/73 | HR 82 | Temp 97.3°F | Resp 16 | Ht 64.0 in | Wt 229.0 lb

## 2024-10-22 DIAGNOSIS — Z79899 Other long term (current) drug therapy: Secondary | ICD-10-CM | POA: Diagnosis not present

## 2024-10-22 DIAGNOSIS — M059 Rheumatoid arthritis with rheumatoid factor, unspecified: Secondary | ICD-10-CM | POA: Diagnosis not present

## 2024-10-22 DIAGNOSIS — G8929 Other chronic pain: Secondary | ICD-10-CM | POA: Diagnosis not present

## 2024-10-22 DIAGNOSIS — M25511 Pain in right shoulder: Secondary | ICD-10-CM

## 2024-10-23 ENCOUNTER — Telehealth: Payer: Self-pay

## 2024-10-23 ENCOUNTER — Ambulatory Visit

## 2024-10-23 ENCOUNTER — Encounter: Payer: Self-pay | Admitting: Podiatry

## 2024-10-23 ENCOUNTER — Ambulatory Visit: Admitting: Podiatry

## 2024-10-23 DIAGNOSIS — S9031XA Contusion of right foot, initial encounter: Secondary | ICD-10-CM | POA: Diagnosis not present

## 2024-10-23 DIAGNOSIS — M7661 Achilles tendinitis, right leg: Secondary | ICD-10-CM

## 2024-10-23 MED ORDER — METHYLPREDNISOLONE 4 MG PO TBPK: ORAL_TABLET | ORAL | 0 refills | Status: DC

## 2024-10-23 NOTE — Patient Instructions (Signed)
 VISIT SUMMARY: Today, we discussed your right heel pain, which has been troubling you for the past couple of months. We also reviewed your chronic kidney disease and its impact on your treatment options.  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 This condition is often caused by overuse and can be painful, especially when walking. To manage this, you will take oral prednisone  for one week to reduce inflammation, wear a walking boot for 3-4 weeks to relieve pressure, and perform home exercises. You are also referred to physical therapy for additional exercises and management. We will follow up in 8 weeks to see how you are doing.  -PRIMARY OSTEOARTHRITIS OF RIGHT ANKLE AND FOOT: You have mild arthritis in your right ankle and foot, which can cause joint discomfort. We will continue with conservative management, including physical therapy and home exercises, to help manage the symptoms.  -CHRONIC KIDNEY DISEASE STAGE 3: You have stage 3 chronic kidney disease, which means your kidneys are moderately damaged and not working as well as they should. This condition affects your treatment options, so we will avoid certain medications that could harm your kidneys. We will continue to monitor your kidney function and adjust your treatment as needed.  INSTRUCTIONS: Please follow the prescribed treatment plan, including taking oral prednisone , wearing the walking boot, and performing the recommended exercises. Attend physical therapy sessions as scheduled. We will have a follow-up appointment in 8 weeks to assess your progress.                      Contains text generated by [employer]bridge.                                 Contains text generated by [employer]bridge.   Achilles Tendinitis  with Rehab Achilles tendinitis is a  disorder of the Achilles tendon. The Achilles tendon connects the large calf muscles (Gastrocnemius and Soleus) to the heel bone (calcaneus). This tendon is sometimes called the heel cord. It is important for pushing-off and standing on your toes and is important for walking, running, or jumping. Tendinitis is often caused by overuse and repetitive microtrauma. SYMPTOMS Pain, tenderness, swelling, warmth, and redness may occur over the Achilles tendon even at rest. Pain with pushing off, or flexing or extending the ankle. Pain that is worsened after or during activity. CAUSES  Overuse sometimes seen with rapid increase in exercise programs or in sports requiring running and jumping. Poor physical conditioning (strength and flexibility or endurance). Running sports, especially training running down hills. Inadequate warm-up before practice or play or failure to stretch before participation. Injury to the tendon. PREVENTION  Warm up and stretch before practice or competition. Allow time for adequate rest and recovery between practices and competition. Keep up conditioning. Keep up ankle and leg flexibility. Improve or keep muscle strength and endurance. Improve cardiovascular fitness. Use proper technique. Use proper equipment (shoes, skates). To help prevent recurrence, taping, protective strapping, or an adhesive bandage may be recommended for several weeks after healing is complete. PROGNOSIS  Recovery may take weeks to several months to heal. Longer recovery is expected if symptoms have been prolonged. Recovery is usually quicker if the inflammation is due to a direct blow as compared with overuse or sudden strain. RELATED COMPLICATIONS  Healing time  will be prolonged if the condition is not correctly treated. The injury must be given plenty of time to heal. Symptoms can reoccur if activity is resumed too soon. Untreated, tendinitis may increase the risk of tendon rupture requiring  additional time for recovery and possibly surgery. TREATMENT  The first treatment consists of rest anti-inflammatory medication, and ice to relieve the pain. Stretching and strengthening exercises after resolution of pain will likely help reduce the risk of recurrence. Referral to a physical therapist or athletic trainer for further evaluation and treatment may be helpful. A walking boot or cast may be recommended to rest the Achilles tendon. This can help break the cycle of inflammation and microtrauma. Arch supports (orthotics) may be prescribed or recommended by your caregiver as an adjunct to therapy and rest. Surgery to remove the inflamed tendon lining or degenerated tendon tissue is rarely necessary and has shown less than predictable results. MEDICATION  Nonsteroidal anti-inflammatory medications, such as aspirin and ibuprofen, may be used for pain and inflammation relief. Do not take within 7 days before surgery. Take these as directed by your caregiver. Contact your caregiver immediately if any bleeding, stomach upset, or signs of allergic reaction occur. Other minor pain relievers, such as acetaminophen , may also be used. Pain relievers may be prescribed as necessary by your caregiver. Do not take prescription pain medication for longer than 4 to 7 days. Use only as directed and only as much as you need. Cortisone injections are rarely indicated. Cortisone injections may weaken tendons and predispose to rupture. It is better to give the condition more time to heal than to use them. HEAT AND COLD Cold is used to relieve pain and reduce inflammation for acute and chronic Achilles tendinitis. Cold should be applied for 10 to 15 minutes every 2 to 3 hours for inflammation and pain and immediately after any activity that aggravates your symptoms. Use ice packs or an ice massage. Heat may be used before performing stretching and strengthening activities prescribed by your caregiver. Use a heat pack  or a warm soak. SEEK MEDICAL CARE IF: Symptoms get worse or do not improve in 2 weeks despite treatment. New, unexplained symptoms develop. Drugs used in treatment may produce side effects.  EXERCISES:  RANGE OF MOTION (ROM) AND STRETCHING EXERCISES - Achilles Tendinitis  These exercises may help you when beginning to rehabilitate your injury. Your symptoms may resolve with or without further involvement from your physician, physical therapist or athletic trainer. While completing these exercises, remember:  Restoring tissue flexibility helps normal motion to return to the joints. This allows healthier, less painful movement and activity. An effective stretch should be held for at least 30 seconds. A stretch should never be painful. You should only feel a gentle lengthening or release in the stretched tissue.  STRETCH  Gastroc, Standing  Place hands on wall. Extend right / left leg, keeping the front knee somewhat bent. Slightly point your toes inward on your back foot. Keeping your right / left heel on the floor and your knee straight, shift your weight toward the wall, not allowing your back to arch. You should feel a gentle stretch in the right / left calf. Hold this position for 10 seconds. Repeat 3 times. Complete this stretch 2 times per day.  STRETCH  Soleus, Standing  Place hands on wall. Extend right / left leg, keeping the other knee somewhat bent. Slightly point your toes inward on your back foot. Keep your right / left heel on the  floor, bend your back knee, and slightly shift your weight over the back leg so that you feel a gentle stretch deep in your back calf. Hold this position for 10 seconds. Repeat 3 times. Complete this stretch 2 times per day.  STRETCH  Gastrocsoleus, Standing  Note: This exercise can place a lot of stress on your foot and ankle. Please complete this exercise only if specifically instructed by your caregiver.  Place the ball of your right / left  foot on a step, keeping your other foot firmly on the same step. Hold on to the wall or a rail for balance. Slowly lift your other foot, allowing your body weight to press your heel down over the edge of the step. You should feel a stretch in your right / left calf. Hold this position for 10 seconds. Repeat this exercise with a slight bend in your knee. Repeat 3 times. Complete this stretch 2 times per day.   STRENGTHENING EXERCISES - Achilles Tendinitis These exercises may help you when beginning to rehabilitate your injury. They may resolve your symptoms with or without further involvement from your physician, physical therapist or athletic trainer. While completing these exercises, remember:  Muscles can gain both the endurance and the strength needed for everyday activities through controlled exercises. Complete these exercises as instructed by your physician, physical therapist or athletic trainer. Progress the resistance and repetitions only as guided. You may experience muscle soreness or fatigue, but the pain or discomfort you are trying to eliminate should never worsen during these exercises. If this pain does worsen, stop and make certain you are following the directions exactly. If the pain is still present after adjustments, discontinue the exercise until you can discuss the trouble with your clinician.  STRENGTH - Plantar-flexors  Sit with your right / left leg extended. Holding onto both ends of a rubber exercise band/tubing, loop it around the ball of your foot. Keep a slight tension in the band. Slowly push your toes away from you, pointing them downward. Hold this position for 10 seconds. Return slowly, controlling the tension in the band/tubing. Repeat 3 times. Complete this exercise 2 times per day.   STRENGTH - Plantar-flexors  Stand with your feet shoulder width apart. Steady yourself with a wall or table using as little support as needed. Keeping your weight evenly spread  over the width of your feet, rise up on your toes.* Hold this position for 10 seconds. Repeat 3 times. Complete this exercise 2 times per day.  *If this is too easy, shift your weight toward your right / left leg until you feel challenged. Ultimately, you may be asked to do this exercise with your right / left foot only.  STRENGTH  Plantar-flexors, Eccentric  Note: This exercise can place a lot of stress on your foot and ankle. Please complete this exercise only if specifically instructed by your caregiver.  Place the balls of your feet on a step. With your hands, use only enough support from a wall or rail to keep your balance. Keep your knees straight and rise up on your toes. Slowly shift your weight entirely to your right / left toes and pick up your opposite foot. Gently and with controlled movement, lower your weight through your right / left foot so that your heel drops below the level of the step. You will feel a slight stretch in the back of your calf at the end position. Use the healthy leg to help rise up onto the  balls of both feet, then lower weight only on the right / left leg again. Build up to 15 repetitions. Then progress to 3 consecutive sets of 15 repetitions.* After completing the above exercise, complete the same exercise with a slight knee bend (about 30 degrees). Again, build up to 15 repetitions. Then progress to 3 consecutive sets of 15 repetitions.* Perform this exercise 2 times per day.  *When you easily complete 3 sets of 15, your physician, physical therapist or athletic trainer may advise you to add resistance by wearing a backpack filled with additional weight.  STRENGTH - Plantar Flexors, Seated  Sit on a chair that allows your feet to rest flat on the ground. If necessary, sit at the edge of the chair. Keeping your toes firmly on the ground, lift your right / left heel as far as you can without increasing any discomfort in your ankle. Repeat 3 times. Complete this  exercise 2 times a day.

## 2024-10-23 NOTE — Telephone Encounter (Signed)
 PT order and demographics faxed to [facility] [phone]

## 2024-10-23 NOTE — Progress Notes (Signed)
 Subjective:  Patient ID: Kim Thomas, female    DOB: February 23, 1956,  MRN: [id number]  Chief Complaint  Patient presents with   Foot Injury    Right heel injury - she was out for a 4 mile walk on 08/16/24 and it started hurting after that. Achilles and peroneal pain    Discussed the use of AI scribe software for clinical note transcription with the patient, who gave verbal consent to proceed.  History of Present Illness Kim Thomas is a 68 year old female with chronic kidney disease who presents with right heel pain.  She has been experiencing right heel pain for the past couple of months, which began after a four-mile walk in the last week of September. The pain is located at the back of her heel and is exacerbated by walking. This has led her to use a walker or cane for mobility around the house.  Initially, she attempted to manage the pain by resting and staying off her feet, believing the issue was due to overuse. Despite these efforts, the pain persisted, particularly after outings, such as a shopping trip with her sister, which resulted in her 'hopping' upon returning home.  She has tried using a support stocking with a gel backing, which has provided some relief, although she did not wear it on the day of the visit due to difficulty putting it on. She has not experienced pain in other areas of her foot or ankle.  Her medical history includes stage 3 chronic kidney disease, which influences her treatment options. She received a steroid injection in her shoulder the day before the visit and inquired about potential interactions with further steroid treatments for her heel pain.      Objective:    Physical Exam VASCULAR: DP and PT pulse palpable. Foot is warm and well-perfused. Capillary fill time is brisk. DERMATOLOGIC: Normal skin turgor, texture, and temperature. No open lesions, rashes, or ulcerations. NEUROLOGIC: Normal sensation to light touch and pressure. No  paresthesias. ORTHOPEDIC: Pain on palpation at right posterior heel at Achilles tendon insertion. No midsubstance pain in right foot. Minimal equinus in right foot. No pain on peroneal tendons in right foot.     No images are attached to the encounter.    Results RADIOLOGY Right ankle and foot X-ray: Mild midfoot and ankle osteoarthritis, subtalar osteoarthritis, posterior calcaneal spur, Haglund deformity (10/23/2024)   Assessment:   1. Achilles tendinitis of right lower extremity      Plan:  Patient was evaluated and treated and all questions answered.  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 Pain exacerbated by overuse and activity. Recent steroid injection in the shoulder may provide some relief. Conservative management preferred due to potential risks of steroid injections in the Achilles area, including exacerbation of any potential tearing. - Prescribed oral methylprednisolone  for one week to control inflammation, with monitoring of blood sugar levels. - Recommended wearing a walking boot for 3-4 weeks to offload pressure from the Achilles tendon. - Provided home exercises for Achilles tendinitis. - Referred to physical therapy for additional exercises and management.  Referral sent to [facility] in [place], she is starting here for her shoulder physical therapy - Scheduled follow-up appointment in 8 weeks to assess progress.  Primary osteoarthritis of right ankle and foot Mild primary osteoarthritis of the right ankle and foot, contributing to overall joint discomfort.  Radiographs show mild midfoot and ankle arthritis, subtalar arthritis, and posterior heel spur. - Continue conservative management with physical therapy and home exercises.  Chronic kidney disease stage 3 Consideration in  the management of Achilles tendinitis. Avoidance of major anti-inflammatory medications due to potential impact on kidney function. - Continue to monitor kidney function and adjust treatment as necessary.      Return in about 8 weeks (around 12/18/2024) for re-check Achilles tendon.

## 2024-10-27 MED ORDER — LIDOCAINE HCL 1 % IJ SOLN
2.0000 mL | INTRAMUSCULAR | Status: AC | PRN
  Administered 2024-10-22: 2 mL

## 2024-10-27 MED ORDER — TRIAMCINOLONE ACETONIDE 40 MG/ML IJ SUSP
40.0000 mg | INTRAMUSCULAR | Status: AC | PRN
  Administered 2024-10-22: 40 mg via INTRA_ARTICULAR

## 2024-11-16 NOTE — Patient Instructions (Signed)

## 2024-11-18 ENCOUNTER — Encounter: Payer: Self-pay | Admitting: Nurse Practitioner

## 2024-11-18 ENCOUNTER — Ambulatory Visit: Admitting: Nurse Practitioner

## 2024-11-18 VITALS — BP 119/72 | HR 76 | Temp 98.2°F | Resp 14 | Ht 64.02 in | Wt 219.8 lb

## 2024-11-18 DIAGNOSIS — L309 Dermatitis, unspecified: Secondary | ICD-10-CM | POA: Insufficient documentation

## 2024-11-18 DIAGNOSIS — E119 Type 2 diabetes mellitus without complications: Secondary | ICD-10-CM | POA: Diagnosis not present

## 2024-11-18 DIAGNOSIS — E1169 Type 2 diabetes mellitus with other specified complication: Secondary | ICD-10-CM | POA: Diagnosis not present

## 2024-11-18 DIAGNOSIS — E1159 Type 2 diabetes mellitus with other circulatory complications: Secondary | ICD-10-CM | POA: Diagnosis not present

## 2024-11-18 DIAGNOSIS — E785 Hyperlipidemia, unspecified: Secondary | ICD-10-CM

## 2024-11-18 DIAGNOSIS — Z79899 Other long term (current) drug therapy: Secondary | ICD-10-CM | POA: Diagnosis not present

## 2024-11-18 DIAGNOSIS — N183 Chronic kidney disease, stage 3 unspecified: Secondary | ICD-10-CM

## 2024-11-18 DIAGNOSIS — Z7985 Long-term (current) use of injectable non-insulin antidiabetic drugs: Secondary | ICD-10-CM

## 2024-11-18 DIAGNOSIS — M059 Rheumatoid arthritis with rheumatoid factor, unspecified: Secondary | ICD-10-CM

## 2024-11-18 DIAGNOSIS — L2082 Flexural eczema: Secondary | ICD-10-CM

## 2024-11-18 DIAGNOSIS — E66811 Obesity, class 1: Secondary | ICD-10-CM

## 2024-11-18 DIAGNOSIS — I152 Hypertension secondary to endocrine disorders: Secondary | ICD-10-CM | POA: Diagnosis not present

## 2024-11-18 DIAGNOSIS — G4733 Obstructive sleep apnea (adult) (pediatric): Secondary | ICD-10-CM

## 2024-11-18 DIAGNOSIS — E1122 Type 2 diabetes mellitus with diabetic chronic kidney disease: Secondary | ICD-10-CM

## 2024-11-18 LAB — BAYER DCA HB A1C WAIVED: HB A1C (BAYER DCA - WAIVED): 5.7 % — ABNORMAL HIGH (ref 4.8–5.6)

## 2024-11-18 MED ORDER — HYDROCORTISONE 2.5 % EX CREA: TOPICAL_CREAM | Freq: Two times a day (BID) | CUTANEOUS | 1 refills | Status: DC

## 2024-11-18 MED ORDER — HYDROCORTISONE 2.5 % EX CREA: TOPICAL_CREAM | Freq: Two times a day (BID) | CUTANEOUS | 1 refills | Status: AC

## 2024-11-18 MED ORDER — TIRZEPATIDE 7.5 MG/0.5ML ~~LOC~~ SOAJ: 7.5000 mg | SUBCUTANEOUS | 5 refills | Status: AC

## 2024-11-18 NOTE — Assessment & Plan Note (Signed)
 Chronic, stable.  BP at goal today. Recommend she check BP at home three mornings a week and document for provider.  Continue Telmisartan  80 MG daily, discussed with her today.  LABS: CMP.  Educated her on Telmisartan  and kidney benefit.  Urine micro alb 80 March 2025.  Focus on DASH diet at home.

## 2024-11-18 NOTE — Assessment & Plan Note (Signed)
 Ongoing and stable.  Diagnosed on 01/20/22, continue collaboration with rheumatology and Plaquenil  as ordered by them which offered some improvement.  Recommend massage therapy added to regimen and continue to visit with chiropractor. Recent notes reviewed.

## 2024-11-18 NOTE — Assessment & Plan Note (Signed)
 Chronic, ongoing. Will stop Triamcinolone  and start Hydrocortisone  2.5% cream. Educated her on this change and discussed to use only for short bursts and then take time off. Small amount to apply only.

## 2024-11-18 NOTE — Assessment & Plan Note (Signed)
 Continue collaboration with rheumatology, labs and recent note reviewed.  Labs today.

## 2024-11-18 NOTE — Assessment & Plan Note (Addendum)
 Refer to diabetes with CKD plan of care.

## 2024-11-18 NOTE — Assessment & Plan Note (Addendum)
 Chronic, stable with CKD 3a. A1c today 5.7%, slight trend up but well at goal. We will increase Mounjaro  to 7.5 MG to support her weight loss goals and continue diabetes control. Continue Olmesartan for kidney protection and visits with nephrology, recent note reviewed. Urine ALB 80 March 2025. Continue to monitor BS daily. - ARB and statin on board - Foot and eye exams up to date - Refuses Flu vaccine, Pneumococcal vaccines up to date.

## 2024-11-18 NOTE — Assessment & Plan Note (Signed)
 BMI 37.71 with T2DM, HTN/HLD.  Recommended eating smaller high protein, low fat meals more frequently and exercising 30 mins a day 5 times a week with a goal of 10-15lb weight loss in the next 3 months. Patient voiced their understanding and motivation to adhere to these recommendations.

## 2024-11-18 NOTE — Assessment & Plan Note (Signed)
 Chronic, ongoing.  Continue current medication regimen and adjust as needed. Lipid panel today.

## 2024-11-18 NOTE — Progress Notes (Signed)
 "  BP 119/72 (BP Location: Left Arm, Patient Position: Sitting, Cuff Size: Large)   Pulse 76   Temp 98.2 F (36.8 C) (Oral)   Resp 14   Ht 5' 4.02 (1.626 m)   Wt 219 lb 12.8 oz (99.7 kg)   LMP  (LMP Unknown)   SpO2 98%   BMI 37.71 kg/m    Subjective:    Patient ID: Kim Thomas, female    DOB: [birth year], 68 y.o.   MRN: [id number]  HPI: Merlean Pizzini is a 68 y.o. female  Chief Complaint  Patient presents with   Diabetes    Normally ranging 89-107. Diet is doing good. No high or low episodes.    HTN/HLD    Generally average around 120/73.    Rheumatoid Arthritis    Had right shoulder injection 2 weeks and should be doing PT. Can't yet have PT as she is being seen for her foot. Feels she may not need PT once she can schedule.    Eczema    Using cream but would like a stronger prescription as it does not tend to work well enough.    DIABETES September A1c 5.1%. Taking Mounjaro  5 MG weekly. Takes Gabapentin  400 MG TID for neuropathy, often does not take the 3rd dose.  Took Ozempic  in past, but changed to Mounjaro  due to her appetite starting to increase and sugars trend up with max dose. She would like to go up on Mounjaro .  History: Jardiance  stopped in 2022 due to stable A1c.   Hypoglycemic episodes: no Polydipsia/polyuria: no Visual disturbance: no Chest pain: no Paresthesias: no Glucose Monitoring: yes             Accucheck frequency: 87 to 107             Fasting glucose:              Post prandial:             Evening:              Before meals:  Taking Insulin ?: no             Long acting insulin :             Short acting insulin : Blood Pressure Monitoring: not checking Retinal Examination: Up To Date Foot Exam: Up to Date Pneumovax: Up To Date Influenza: refuses Aspirin: yes  HYPERTENSION / HYPERLIPIDEMIA Continues Atorvastatin  20 MG daily and Telmisartan  80 MG.  Satisfied with current treatment? yes Duration of hypertension: chronic BP monitoring  frequency: occasional BP range: <130/80 on average BP medication side effects: no Duration of hyperlipidemia: chronic Cholesterol medication side effects: no Cholesterol supplements: none Medication compliance: good compliance Aspirin: yes Recent stressors: no Recurrent headaches: no Visual changes: no Palpitations: no Dyspnea: no Chest pain: no Lower extremity edema: rarely Dizzy/lightheaded: no The 10-year ASCVD risk score (Arnett DK, et al., 2019) is: 14.8%   Values used to calculate the score:     Age: 48 years     Clinically relevant sex: Female     Is Non-Hispanic African American: Yes     Diabetic: Yes     Tobacco smoker: No     Systolic Blood Pressure: 119 mmHg     Is BP treated: Yes     HDL Cholesterol: 74 mg/dL     Total Cholesterol: 135 mg/dL   CHRONIC KIDNEY DISEASE (CKD 3a) Follows with nephrology on annual basis. Last visit 08/20/24. CKD status: stable Medications renally dose:  yes Previous renal evaluation: yes Pneumovax:  Up To Date Influenza Vaccine:  Refuses    Latest Ref Rng & Units 08/16/2024   10:00 AM 05/16/2024    8:36 AM 02/13/2024    1:20 PM  BMP  Glucose 70 - 99 mg/dL 887  74  93   BUN 8 - 27 mg/dL 12  15  13    Creatinine 0.57 - 1.00 mg/dL 8.75  8.78  8.81   BUN/Creat Ratio 12 - 28 10  12  11    Sodium 134 - 144 mmol/L 140  142  145   Potassium 3.5 - 5.2 mmol/L 5.2  4.3  3.8   Chloride 96 - 106 mmol/L 106  106  108   CO2 20 - 29 mmol/L 19  21  24    Calcium  8.7 - 10.3 mg/dL 9.1  9.1  8.9    RHEUMATOID ARTHRITIS Last rheumatology visit was 10/22/24, got shoulder injection. Takes Plaquenil . Was to schedule PT for shoulder, but ended up having some Achilles tendinitis.  Sees podiatry, last saw 10/23/24. Triamcinolone  is not working 100% for her eczema, she would like to try something different. Duration: months Pain: no Context:  better Decreased function/range of motion: improved Erythema: no Swelling: no Heat or warmth: no Morning  stiffness: stable Aggravating factors: none Alleviating factors: Plaquenil  Relief with NSAIDs?: No NSAIDs Taken Treatments attempted:  Parafin wax, Tylenol , Plaquenil , CBD cream Involved Joints:     Hands: yes bilateral    Wrists: none    Elbows: none    Shoulders: none    Back: yes     Hips: none    Ankles: none    Feet: none     11/18/2024   10:44 AM 08/27/2024   10:59 AM 08/16/2024    9:47 AM 05/16/2024    8:05 AM 02/13/2024    1:07 PM  Depression screen PHQ 2/9  Decreased Interest 0 0 0 0 0  Down, Depressed, Hopeless 0 0 0 0 0  PHQ - 2 Score 0 0 0 0 0  Altered sleeping 0 0 0 0 0  Tired, decreased energy 0 0 0 0 0  Change in appetite 0 0 0 0 0  Feeling bad or failure about yourself  0 0 0 0 0  Trouble concentrating 0 0 0 0 0  Moving slowly or fidgety/restless 0 0 0 0 0  Suicidal thoughts 0 0 0 0 0  PHQ-9 Score 0 0  0  0  0   Difficult doing work/chores  Not difficult at all  Not difficult at all Not difficult at all     Data saved with a previous flowsheet row definition       11/18/2024   10:45 AM 08/16/2024    9:46 AM 05/16/2024    8:05 AM 02/13/2024    1:07 PM  GAD 7 : Generalized Anxiety Score  Nervous, Anxious, on Edge 0 0 0 0  Control/stop worrying 0 0 0 0  Worry too much - different things 0 0 0 0  Trouble relaxing 0 0 0 0  Restless 0 0 0 0  Easily annoyed or irritable 0 0 0 0  Afraid - awful might happen 0 0 0 0  Total GAD 7 Score 0 0 0 0  Anxiety Difficulty   Not difficult at all Not difficult at all   Relevant past medical, surgical, family and social history reviewed and updated as indicated. Interim medical history since our last visit reviewed. Allergies and  medications reviewed and updated.  Review of Systems  Constitutional:  Negative for activity change, appetite change, diaphoresis, fatigue and fever.  Respiratory:  Negative for cough, chest tightness and shortness of breath.   Cardiovascular:  Negative for chest pain, palpitations and leg  swelling.  Gastrointestinal: Negative.   Endocrine: Negative for cold intolerance, heat intolerance, polydipsia, polyphagia and polyuria.  Neurological: Negative.   Psychiatric/Behavioral: Negative.     Per HPI unless specifically indicated above     Objective:    BP 119/72 (BP Location: Left Arm, Patient Position: Sitting, Cuff Size: Large)   Pulse 76   Temp 98.2 F (36.8 C) (Oral)   Resp 14   Ht 5' 4.02 (1.626 m)   Wt 219 lb 12.8 oz (99.7 kg)   LMP  (LMP Unknown)   SpO2 98%   BMI 37.71 kg/m   Wt Readings from Last 3 Encounters:  11/18/24 219 lb 12.8 oz (99.7 kg)  10/22/24 229 lb (103.9 kg)  08/27/24 216 lb (98 kg)    Physical Exam Vitals and nursing note reviewed.  Constitutional:      General: She is awake. She is not in acute distress.    Appearance: Normal appearance. She is well-developed and well-groomed. She is obese. She is not ill-appearing or toxic-appearing.  HENT:     Head: Normocephalic.     Right Ear: Hearing and external ear normal.     Left Ear: Hearing and external ear normal.  Eyes:     General: Lids are normal.        Right eye: No discharge.        Left eye: No discharge.     Conjunctiva/sclera: Conjunctivae normal.     Pupils: Pupils are equal, round, and reactive to light.  Neck:     Thyroid : No thyromegaly.     Vascular: No carotid bruit.  Cardiovascular:     Rate and Rhythm: Normal rate and regular rhythm.     Heart sounds: Normal heart sounds. No murmur heard.    No gallop.  Pulmonary:     Effort: Pulmonary effort is normal. No accessory muscle usage or respiratory distress.     Breath sounds: Normal breath sounds.  Abdominal:     General: Bowel sounds are normal. There is no distension.     Palpations: Abdomen is soft.     Tenderness: There is no abdominal tenderness.  Musculoskeletal:     Cervical back: Normal range of motion and neck supple.     Right lower leg: No edema.     Left lower leg: No edema.  Lymphadenopathy:      Cervical: No cervical adenopathy.  Skin:    General: Skin is warm and dry.  Neurological:     Mental Status: She is alert and oriented to person, place, and time.     Deep Tendon Reflexes: Reflexes are normal and symmetric.     Reflex Scores:      Brachioradialis reflexes are 2+ on the right side and 2+ on the left side.      Patellar reflexes are 2+ on the right side and 2+ on the left side. Psychiatric:        Attention and Perception: Attention normal.        Mood and Affect: Mood normal.        Speech: Speech normal.        Behavior: Behavior normal. Behavior is cooperative.        Thought Content: Thought content normal.  Results for orders placed or performed in visit on 09/18/24  OPHTHALMOLOGY REPORT-SCANNED   Collection Time: 09/17/24 12:36 PM  Result Value Ref Range   HM Diabetic Eye Exam No Retinopathy No Retinopathy   A Comment        Assessment & Plan:   Problem List Items Addressed This Visit       Cardiovascular and Mediastinum   Hypertension associated with diabetes (HCC)   Chronic, stable.  BP at goal today. Recommend she check BP at home three mornings a week and document for provider.  Continue Telmisartan  80 MG daily, discussed with her today.  LABS: CMP.  Educated her on Telmisartan  and kidney benefit.  Urine micro alb 80 March 2025.  Focus on DASH diet at home.        Relevant Medications   tirzepatide  (MOUNJARO ) 7.5 MG/0.5ML Pen   Other Relevant Orders   Bayer DCA Hb A1c Waived     Endocrine   Hyperlipidemia associated with type 2 diabetes mellitus (HCC)   Chronic, ongoing.  Continue current medication regimen and adjust as needed.  Lipid panel today.      Relevant Medications   tirzepatide  (MOUNJARO ) 7.5 MG/0.5ML Pen   Other Relevant Orders   Bayer DCA Hb A1c Waived   Comprehensive metabolic panel with GFR   Lipid Panel w/o Chol/HDL Ratio   Diabetes mellitus treated with injections of non-insulin  medication (HCC)   Refer to diabetes with  CKD plan of care.      Relevant Medications   tirzepatide  (MOUNJARO ) 7.5 MG/0.5ML Pen   Other Relevant Orders   Bayer DCA Hb A1c Waived   CKD stage 3 due to type 2 diabetes mellitus (HCC) - Primary   Chronic, stable with CKD 3a. A1c today 5.7%, slight trend up but well at goal. We will increase Mounjaro  to 7.5 MG to support her weight loss goals and continue diabetes control. Continue Olmesartan for kidney protection and visits with nephrology, recent note reviewed. Urine ALB 80 March 2025. Continue to monitor BS daily. - ARB and statin on board - Foot and eye exams up to date - Refuses Flu vaccine, Pneumococcal vaccines up to date.      Relevant Medications   tirzepatide  (MOUNJARO ) 7.5 MG/0.5ML Pen   Other Relevant Orders   Bayer DCA Hb A1c Waived     Musculoskeletal and Integument   Seropositive rheumatoid arthritis (HCC)   Ongoing and stable.  Diagnosed on 01/20/22, continue collaboration with rheumatology and Plaquenil  as ordered by them which offered some improvement.  Recommend massage therapy added to regimen and continue to visit with chiropractor. Recent notes reviewed.      Eczema   Chronic, ongoing. Will stop Triamcinolone  and start Hydrocortisone  2.5% cream. Educated her on this change and discussed to use only for short bursts and then take time off. Small amount to apply only.        Other   Obesity, morbid (HCC)   BMI 37.71 with T2DM, HTN/HLD.  Recommended eating smaller high protein, low fat meals more frequently and exercising 30 mins a day 5 times a week with a goal of 10-15lb weight loss in the next 3 months. Patient voiced their understanding and motivation to adhere to these recommendations.       Relevant Medications   tirzepatide  (MOUNJARO ) 7.5 MG/0.5ML Pen   Long-term use of hydroxychloroquine    Continue collaboration with rheumatology, labs and recent note reviewed.  Labs today.        Follow up plan:  Return in about 3 months (around 02/16/2025) for  Annual Physical -- after 02/12/25.      "

## 2024-11-19 ENCOUNTER — Ambulatory Visit: Payer: Self-pay | Admitting: Nurse Practitioner

## 2024-11-19 DIAGNOSIS — E875 Hyperkalemia: Secondary | ICD-10-CM

## 2024-11-19 LAB — COMPREHENSIVE METABOLIC PANEL WITH GFR
ALT: 23 IU/L (ref 0–32)
AST: 33 IU/L (ref 0–40)
Albumin: 3.9 g/dL (ref 3.9–4.9)
Alkaline Phosphatase: 71 IU/L (ref 49–135)
BUN/Creatinine Ratio: 14 (ref 12–28)
BUN: 18 mg/dL (ref 8–27)
Bilirubin Total: 0.4 mg/dL (ref 0.0–1.2)
CO2: 20 mmol/L (ref 20–29)
Calcium: 9.3 mg/dL (ref 8.7–10.3)
Chloride: 105 mmol/L (ref 96–106)
Creatinine, Ser: 1.27 mg/dL — ABNORMAL HIGH (ref 0.57–1.00)
Globulin, Total: 2.6 g/dL (ref 1.5–4.5)
Glucose: 91 mg/dL (ref 70–99)
Potassium: 5.6 mmol/L — ABNORMAL HIGH (ref 3.5–5.2)
Sodium: 139 mmol/L (ref 134–144)
Total Protein: 6.5 g/dL (ref 6.0–8.5)
eGFR: 46 mL/min/1.73 — ABNORMAL LOW

## 2024-11-19 LAB — LIPID PANEL W/O CHOL/HDL RATIO
Cholesterol, Total: 143 mg/dL (ref 100–199)
HDL: 81 mg/dL
LDL Chol Calc (NIH): 50 mg/dL (ref 0–99)
Triglycerides: 56 mg/dL (ref 0–149)
VLDL Cholesterol Cal: 12 mg/dL (ref 5–40)

## 2024-11-19 NOTE — Progress Notes (Signed)
 Contacted via MyChart -- need lab only visit in 2 weeks please  Good afternoon Kim Thomas, your labs have returned and remain at baseline for you. Kidney function continues to show Stage 3a kidney disease with no worsening. Potassium is a little elevated. I recommend cutting back on potassium rich foods like bananas, mangoes, dried fruit, raisin bran, orange juice, potatoes. I would like to recheck this in 2 weeks outpatient to ensure trend down. Lipid panel levels are at goal. Any questions? Keep being amazing!!  Thank you for allowing me to participate in your care.  I appreciate you. Kindest regards, Mukesh Kornegay

## 2024-12-03 ENCOUNTER — Other Ambulatory Visit

## 2024-12-03 DIAGNOSIS — Z780 Asymptomatic menopausal state: Secondary | ICD-10-CM

## 2024-12-03 DIAGNOSIS — E875 Hyperkalemia: Secondary | ICD-10-CM

## 2024-12-03 DIAGNOSIS — Z1231 Encounter for screening mammogram for malignant neoplasm of breast: Secondary | ICD-10-CM

## 2024-12-04 ENCOUNTER — Ambulatory Visit: Payer: Self-pay | Admitting: Nurse Practitioner

## 2024-12-04 LAB — POTASSIUM: Potassium: 4.2 mmol/L (ref 3.5–5.2)

## 2024-12-04 NOTE — Progress Notes (Signed)
 Potassium level now normal. Great news!!

## 2024-12-05 ENCOUNTER — Other Ambulatory Visit (HOSPITAL_COMMUNITY): Payer: Self-pay

## 2024-12-05 ENCOUNTER — Telehealth: Payer: Self-pay | Admitting: Pharmacy Technician

## 2024-12-05 NOTE — Telephone Encounter (Signed)
 Pharmacy Patient Advocate Encounter   Received notification from Onbase CMM KEY that prior authorization for Ozempic  (2 MG/DOSE) 8MG /3ML pen-injectors is due for renewal.   Insurance verification completed.   The patient is insured through [employer].  Action: Medication has been discontinued. Archived Key: [id number]  Patient currently on Mounjaro .

## 2024-12-09 ENCOUNTER — Telehealth: Payer: Self-pay | Admitting: Internal Medicine

## 2024-12-09 ENCOUNTER — Other Ambulatory Visit: Payer: Self-pay | Admitting: *Deleted

## 2024-12-09 DIAGNOSIS — M545 Low back pain, unspecified: Secondary | ICD-10-CM

## 2024-12-09 NOTE — Telephone Encounter (Signed)
 Pt called stating she would like another referral to be sent to the same PT place that Dr. Jeannetta has sent before. Pt stated it is in .

## 2024-12-13 ENCOUNTER — Telehealth: Payer: Self-pay | Admitting: Internal Medicine

## 2024-12-13 NOTE — Telephone Encounter (Signed)
 I called patient, referral changed and faxed to [facility] in [place], Right shoulder pain added to PT referral.

## 2024-12-13 NOTE — Telephone Encounter (Signed)
 Patient called stating she received a call from [facility] for her lower back.  Patient states the referral was suppose to be sent to [facility] in [facility] for her right shoulder.  Patient requested a return call.

## 2024-12-18 ENCOUNTER — Ambulatory Visit: Admitting: Podiatry

## 2024-12-24 ENCOUNTER — Other Ambulatory Visit: Payer: Self-pay | Admitting: Internal Medicine

## 2024-12-24 DIAGNOSIS — R7681 Abnormal rheumatoid factor and anti-citrullinated protein antibody without rheumatoid arthritis: Secondary | ICD-10-CM

## 2024-12-24 DIAGNOSIS — M059 Rheumatoid arthritis with rheumatoid factor, unspecified: Secondary | ICD-10-CM

## 2024-12-24 DIAGNOSIS — M79641 Pain in right hand: Secondary | ICD-10-CM

## 2024-12-25 ENCOUNTER — Other Ambulatory Visit: Payer: Self-pay | Admitting: Internal Medicine

## 2024-12-25 DIAGNOSIS — M79641 Pain in right hand: Secondary | ICD-10-CM

## 2024-12-25 DIAGNOSIS — M059 Rheumatoid arthritis with rheumatoid factor, unspecified: Secondary | ICD-10-CM

## 2024-12-25 DIAGNOSIS — R7681 Abnormal rheumatoid factor and anti-citrullinated protein antibody without rheumatoid arthritis: Secondary | ICD-10-CM

## 2024-12-25 NOTE — Telephone Encounter (Signed)
 Yes, just confirming as it looks like it was sent already.

## 2024-12-25 NOTE — Telephone Encounter (Signed)
 Last Fill: 09/30/2024  Eye exam: 08/27/2024 WNL   Labs: 11/18/2024 Creat. 1.27, GFR 46, Potassium 5.6  Next Visit: 04/22/2025  Last Visit: 10/22/2024  IK:[id number] rheumatoid arthritis   Current Dose per office note 10/22/2024: hydroxychloroquine  300 mg daily   Okay to refill Plaquenil ?

## 2025-01-01 ENCOUNTER — Ambulatory Visit: Admitting: Podiatry

## 2025-01-15 ENCOUNTER — Ambulatory Visit: Admitting: Podiatry

## 2025-02-17 ENCOUNTER — Encounter: Admitting: Nurse Practitioner

## 2025-04-22 ENCOUNTER — Ambulatory Visit: Admitting: Internal Medicine

## 2025-09-02 ENCOUNTER — Ambulatory Visit
